# Patient Record
Sex: Female | Born: 1990 | Race: White | Hispanic: No | Marital: Married | Smoking: Current every day smoker
Health system: Southern US, Community
[De-identification: ages and names within clinical notes are randomized; demographics above are authoritative.]

## PROBLEM LIST (undated history)

## (undated) ENCOUNTER — Inpatient Hospital Stay: Payer: Self-pay

## (undated) DIAGNOSIS — F419 Anxiety disorder, unspecified: Secondary | ICD-10-CM

## (undated) DIAGNOSIS — Z1509 Genetic susceptibility to other malignant neoplasm: Secondary | ICD-10-CM

## (undated) DIAGNOSIS — N946 Dysmenorrhea, unspecified: Secondary | ICD-10-CM

## (undated) DIAGNOSIS — G43909 Migraine, unspecified, not intractable, without status migrainosus: Secondary | ICD-10-CM

## (undated) DIAGNOSIS — Z803 Family history of malignant neoplasm of breast: Secondary | ICD-10-CM

## (undated) DIAGNOSIS — Q278 Other specified congenital malformations of peripheral vascular system: Secondary | ICD-10-CM

## (undated) DIAGNOSIS — Z1589 Genetic susceptibility to other disease: Secondary | ICD-10-CM

## (undated) DIAGNOSIS — Z1379 Encounter for other screening for genetic and chromosomal anomalies: Secondary | ICD-10-CM

## (undated) DIAGNOSIS — Z1501 Genetic susceptibility to malignant neoplasm of breast: Secondary | ICD-10-CM

## (undated) HISTORY — DX: Genetic susceptibility to malignant neoplasm of breast: Z15.01

## (undated) HISTORY — DX: Encounter for other screening for genetic and chromosomal anomalies: Z13.79

## (undated) HISTORY — DX: Genetic susceptibility to other malignant neoplasm: Z15.09

## (undated) HISTORY — DX: Family history of malignant neoplasm of breast: Z80.3

## (undated) HISTORY — PX: CHOLECYSTECTOMY: SHX55

## (undated) HISTORY — DX: Genetic susceptibility to other disease: Z15.89

## (undated) HISTORY — DX: Dysmenorrhea, unspecified: N94.6

---
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
## 2010-03-18 ENCOUNTER — Ambulatory Visit (HOSPITAL_COMMUNITY): Admission: RE | Admit: 2010-03-18 | Discharge: 2010-03-18 | Payer: Self-pay | Admitting: Obstetrics & Gynecology

## 2010-03-18 IMAGING — US US OB DETAIL+14 WK
1 series · 14 of 28 positions shown · non-contrast
Comparison: none

OBSTETRICAL ULTRASOUND:
 This ultrasound was performed in The [HOSPITAL], and the [facility] report will be stored to [REDACTED] PACS.  This report is also available in [HOSPITAL]?s accessANYware.

[Series 1: us ob detail+14 wk · 74 acquisitions, 14 frames shown]
[im 3/74]
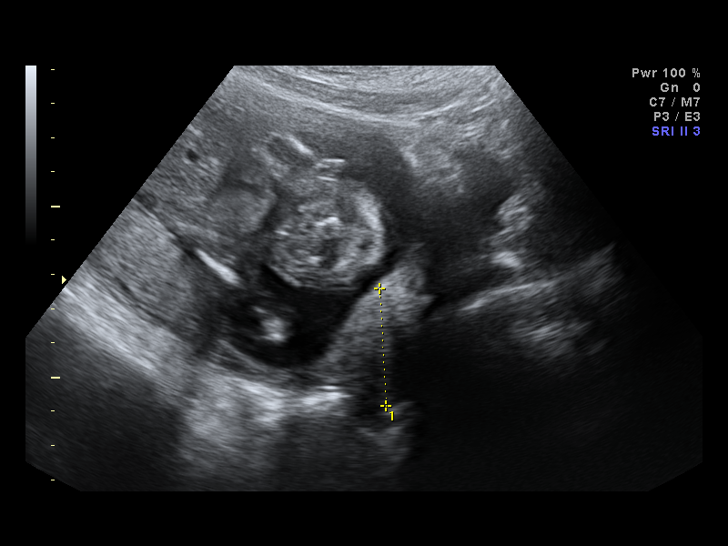
[im 9/74]
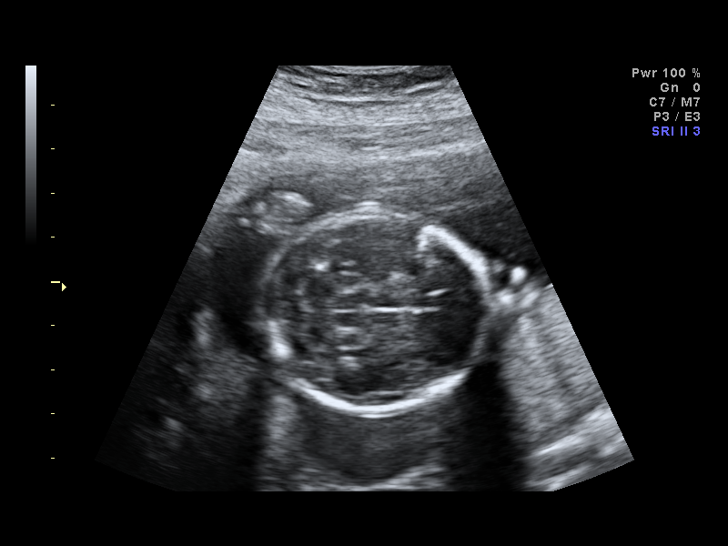
[im 14/74]
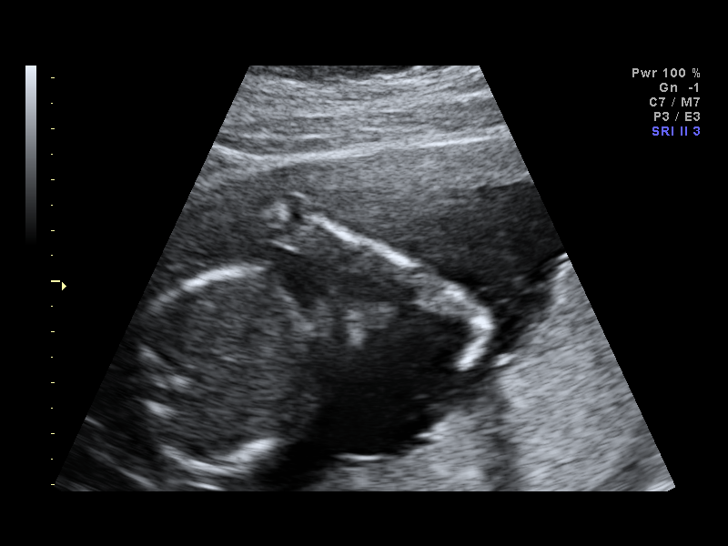
[im 19/74]
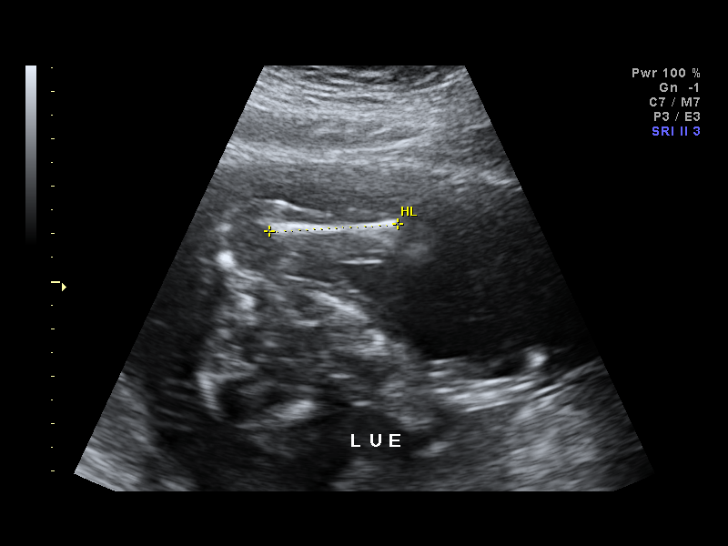
[im 25/74]
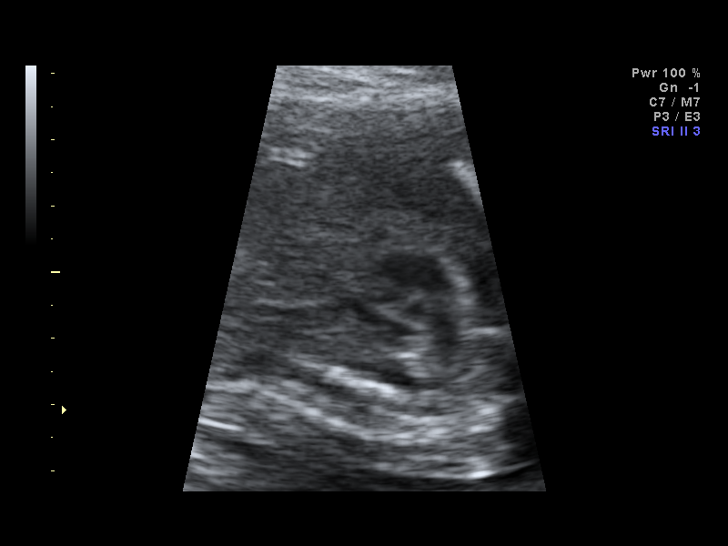
[im 30/74]
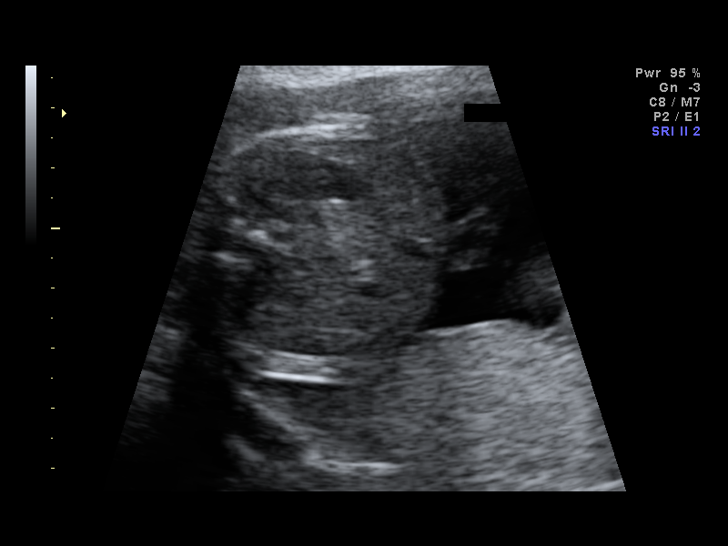
[im 36/74]
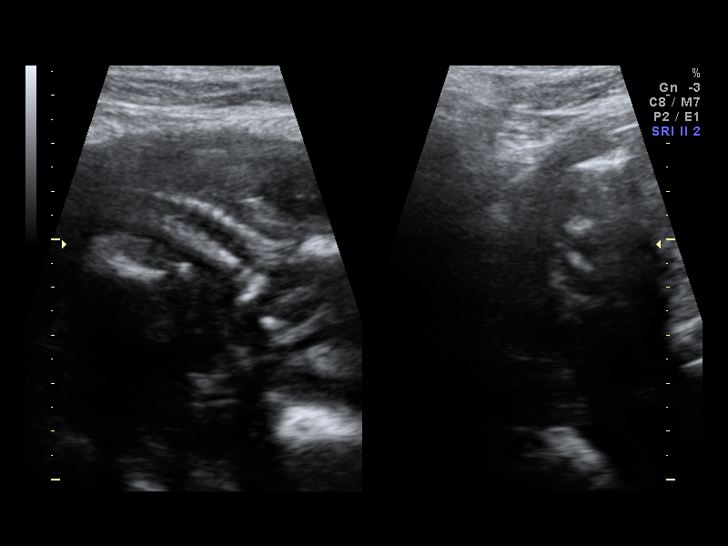
[im 41/74]
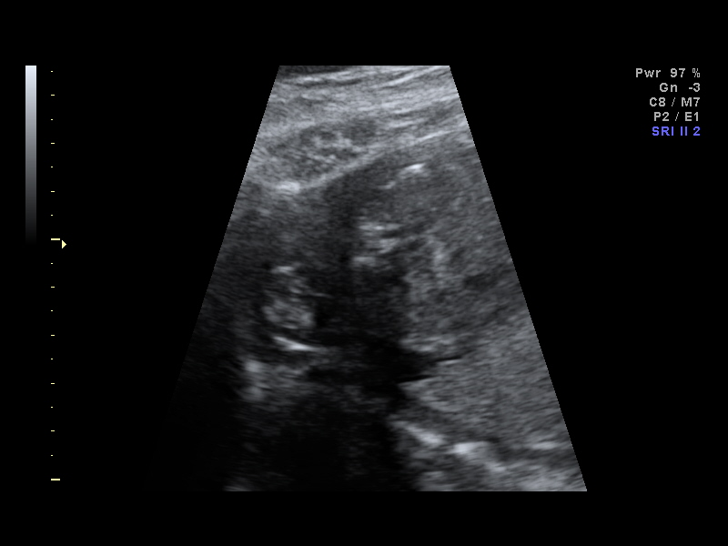
[im 46/74]
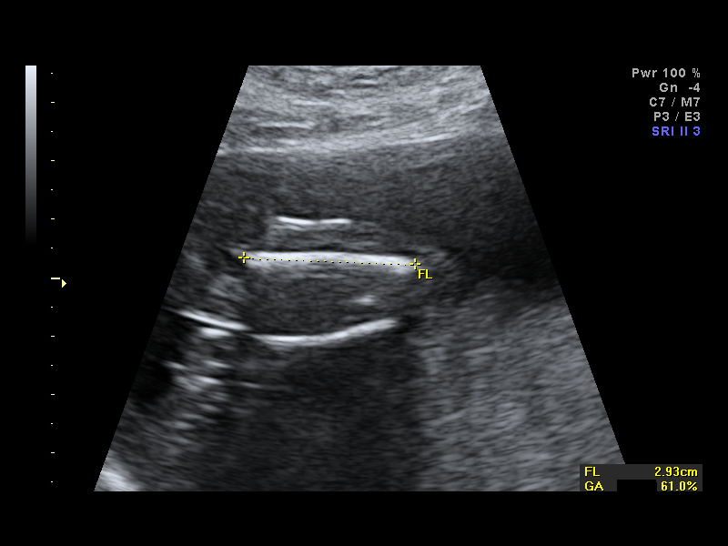
[im 52/74]
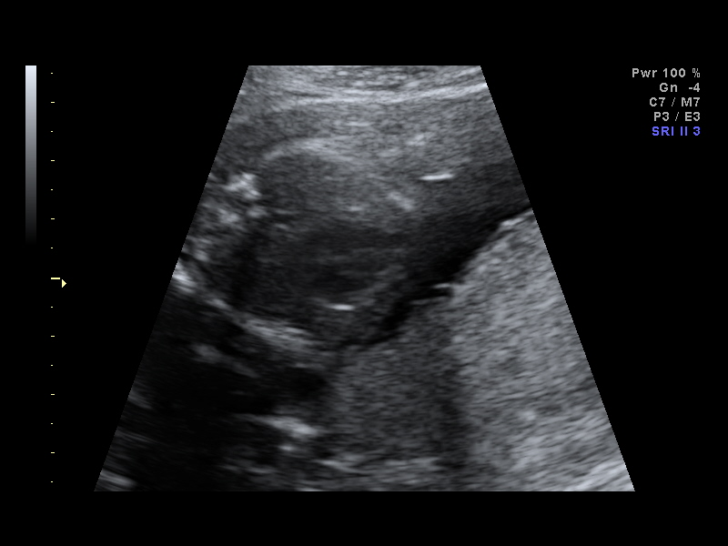
[im 57/74]
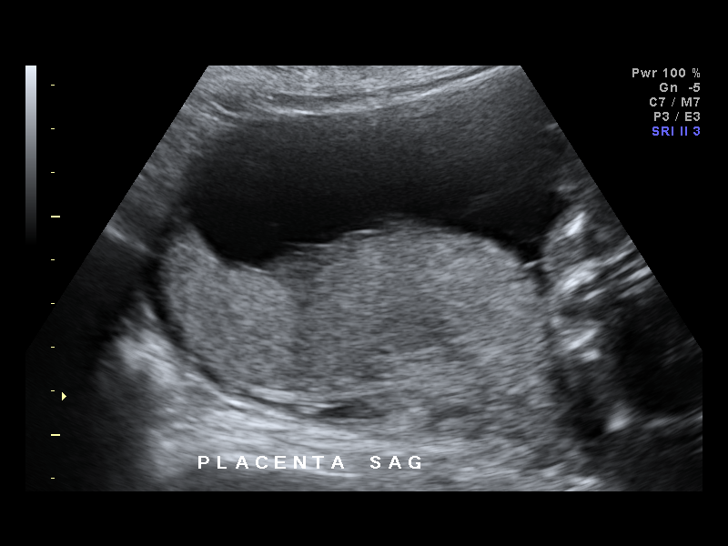
[im 63/74]
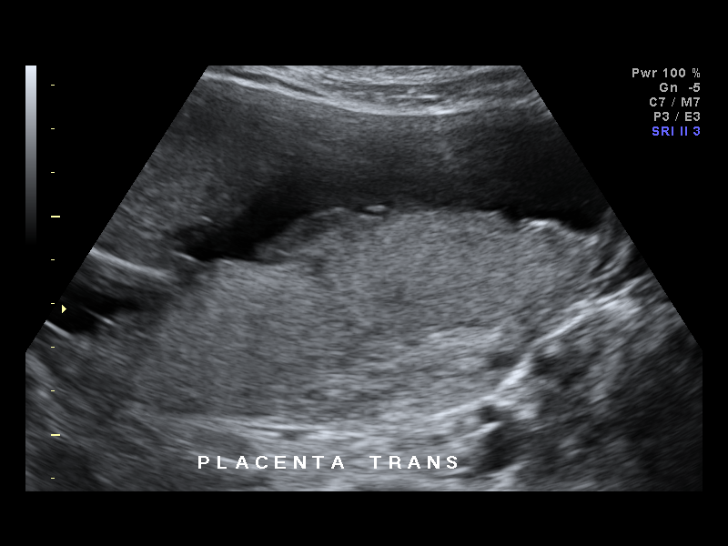
[im 68/74]
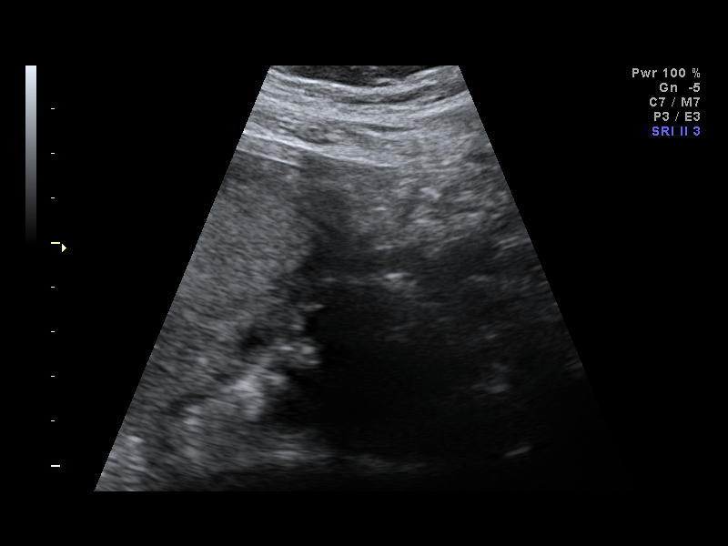
[im 74/74]
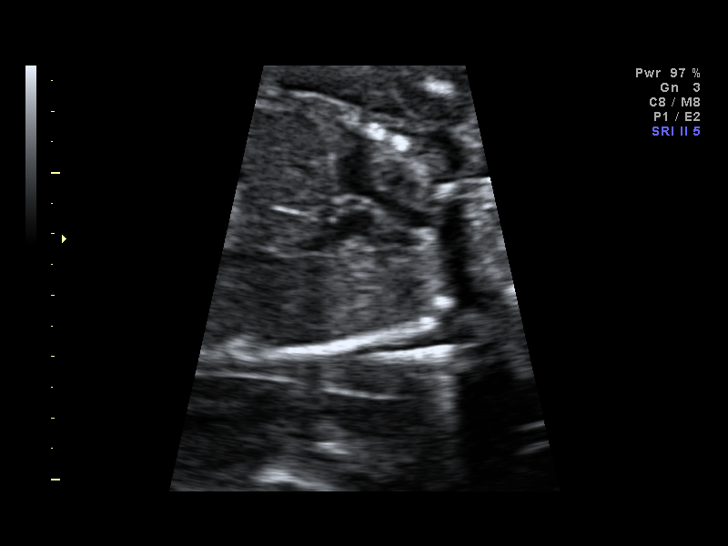

[14 of 28 positions shown; findings below may reference images not displayed]

IMPRESSION: [facility] has also been faxed to the ordering physician.

## 2010-05-06 ENCOUNTER — Ambulatory Visit (HOSPITAL_COMMUNITY): Admission: RE | Admit: 2010-05-06 | Discharge: 2010-05-06 | Payer: Self-pay | Admitting: Obstetrics & Gynecology

## 2010-05-06 IMAGING — US US OB FOLLOW-UP
1 series · 18 of 28 positions shown · non-contrast
Comparison: none

OBSTETRICAL ULTRASOUND:
 This ultrasound was performed in The [[facility]OSP[facility]], and the [facility] report will be stored to [REDACTED] PACS.  This report is also available in [HOSPITAL]?s accessANYware.

[Series 1: us ob follow-up · 41 acquisitions, 18 frames shown]
[im 1/41]
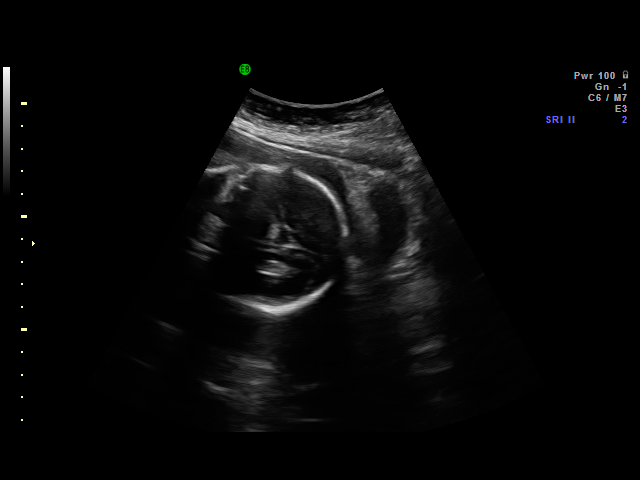
[im 3/41]
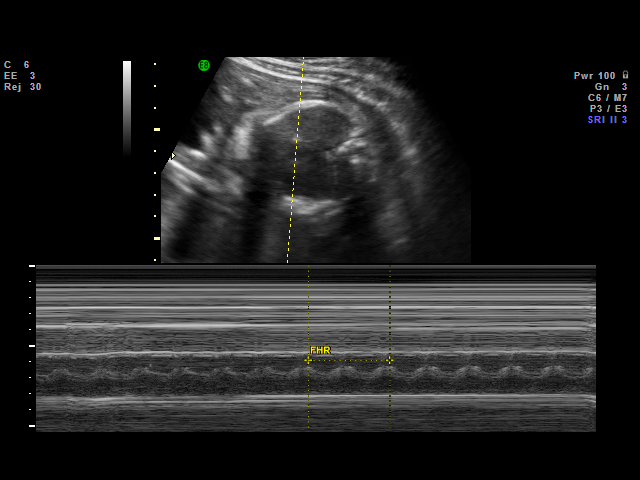
[im 5/41]
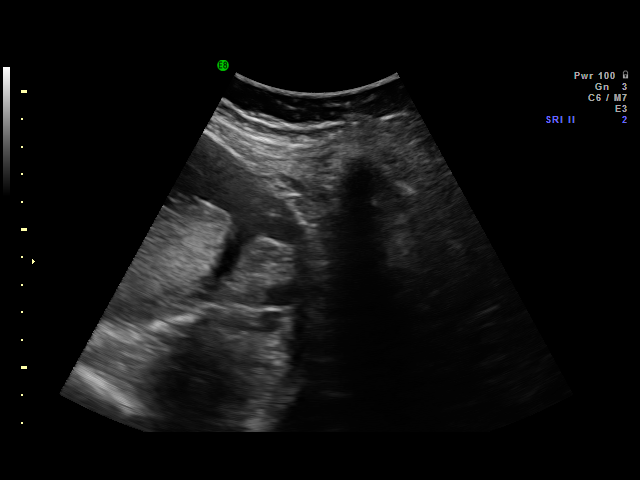
[im 8/41]
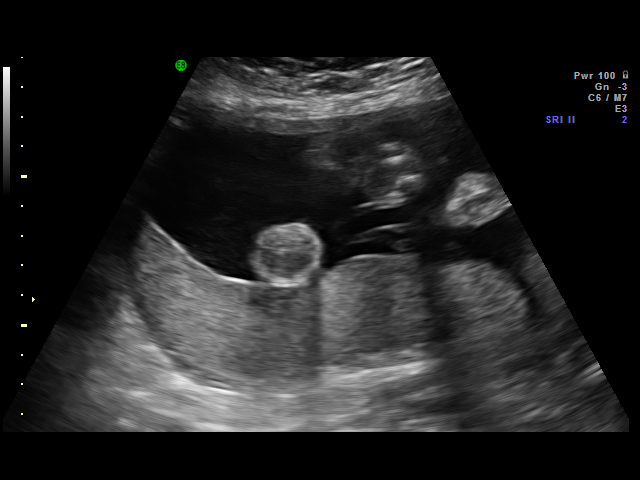
[im 11/41]
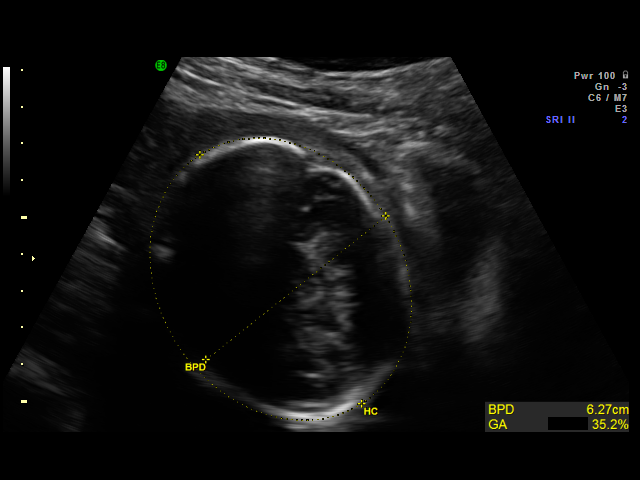
[im 12/41]
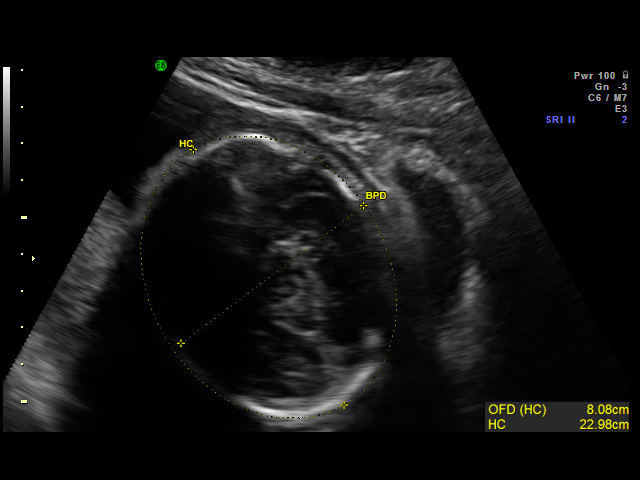
[im 15/41]
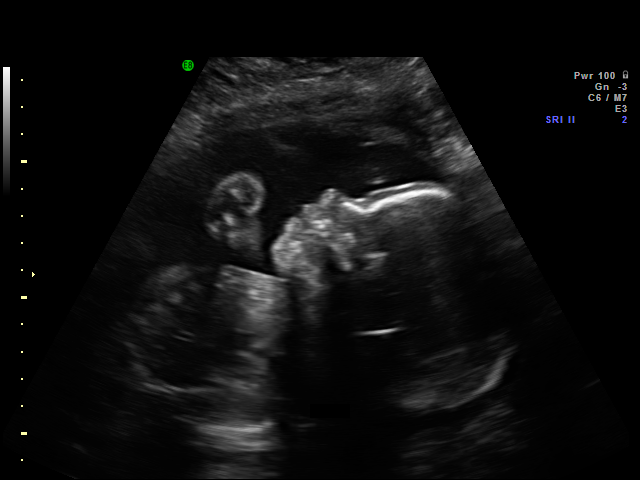
[im 17/41]
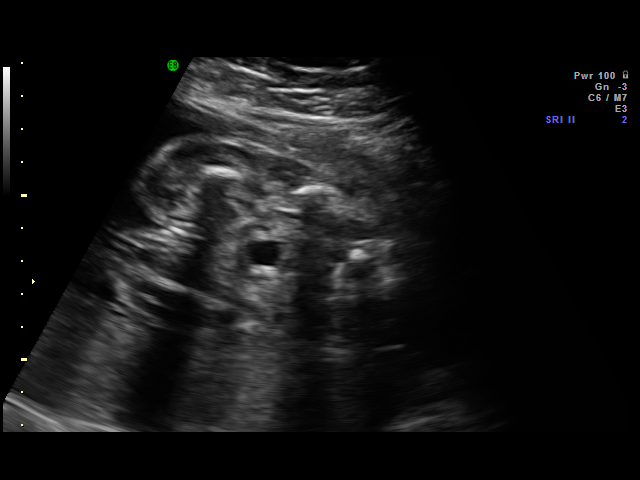
[im 20/41]
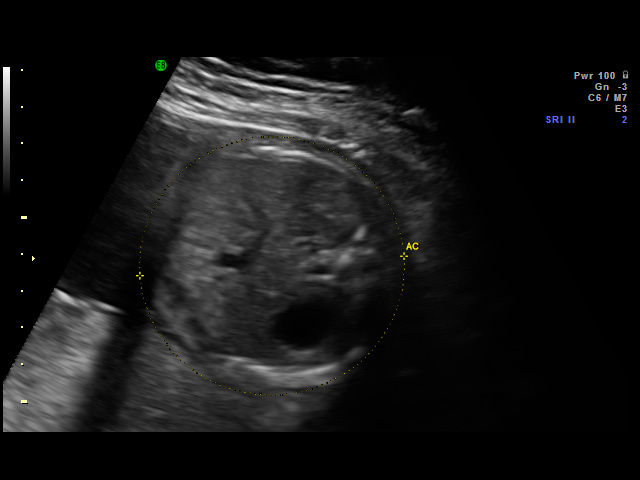
[im 21/41]
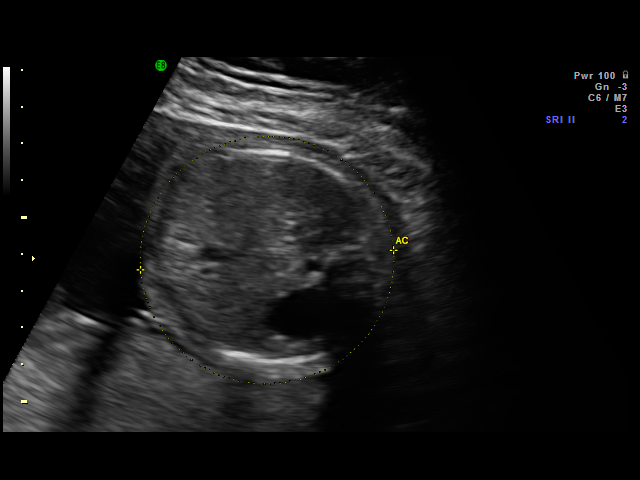
[im 24/41]
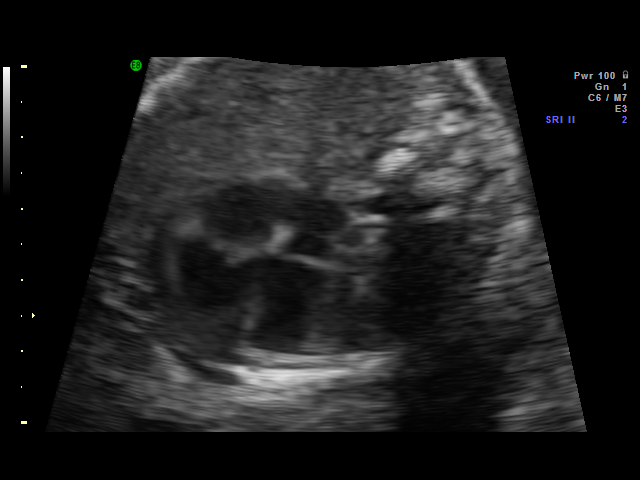
[im 26/41]
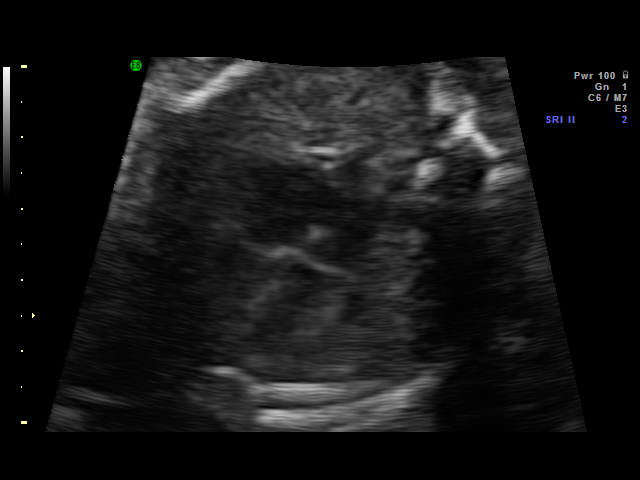
[im 29/41]
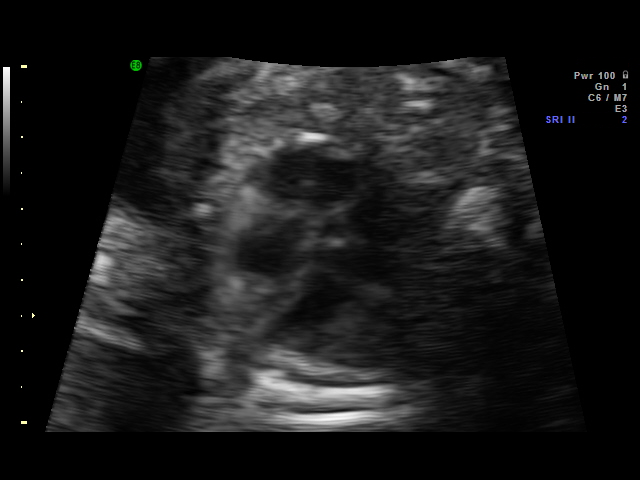
[im 32/41]
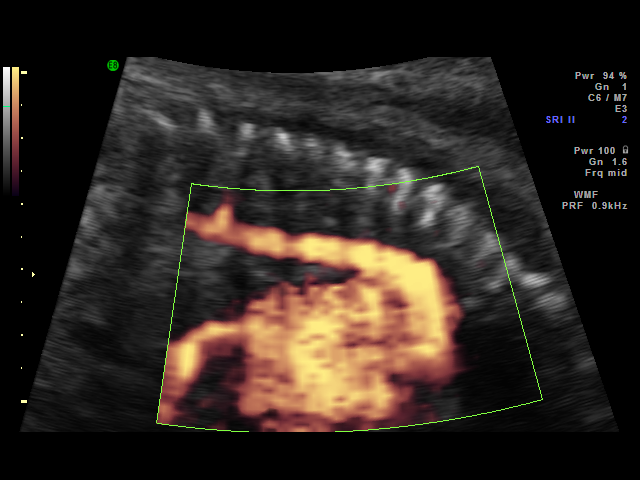
[im 33/41]
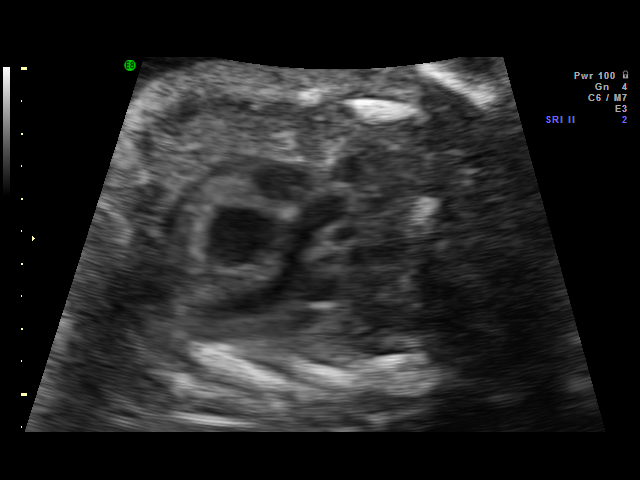
[im 36/41]
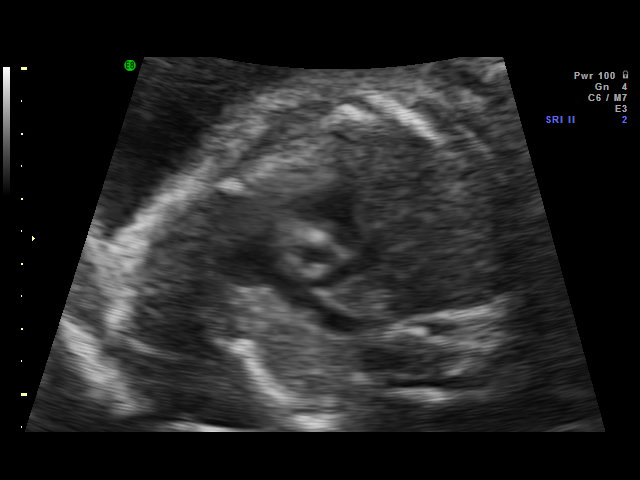
[im 38/41]
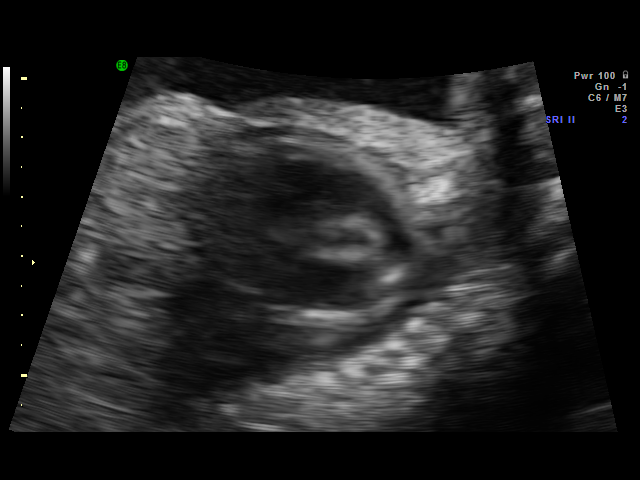
[im 41/41]
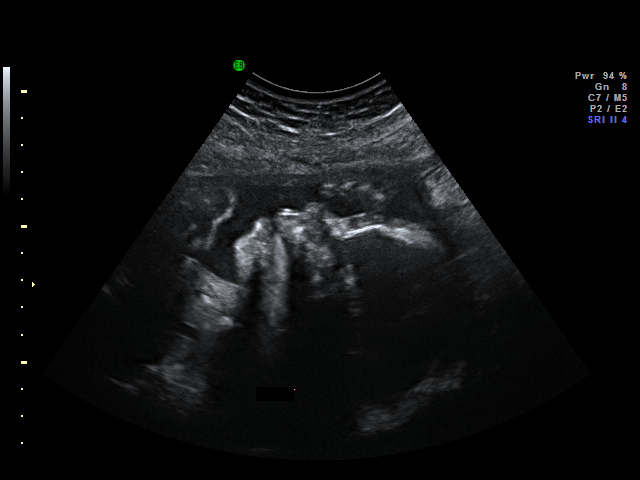

[18 of 28 positions shown; findings below may reference images not displayed]

IMPRESSION: [facility] has also been faxed to the ordering physician.

## 2010-06-03 ENCOUNTER — Ambulatory Visit (HOSPITAL_COMMUNITY): Admission: RE | Admit: 2010-06-03 | Discharge: 2010-06-03 | Payer: Self-pay | Admitting: Obstetrics & Gynecology

## 2010-06-03 IMAGING — US US OB FOLLOW-UP
1 series · 14 of 28 positions shown · non-contrast
Comparison: none

OBSTETRICAL ULTRASOUND:
 This ultrasound was performed in The [[facility]OSP[facility]], and the [facility] report will be stored to [REDACTED] PACS.  This report is also available in [HOSPITAL]?s accessANYware.

[Series 1: us ob follow-up · 68 acquisitions, 14 frames shown]
[im 3/68]
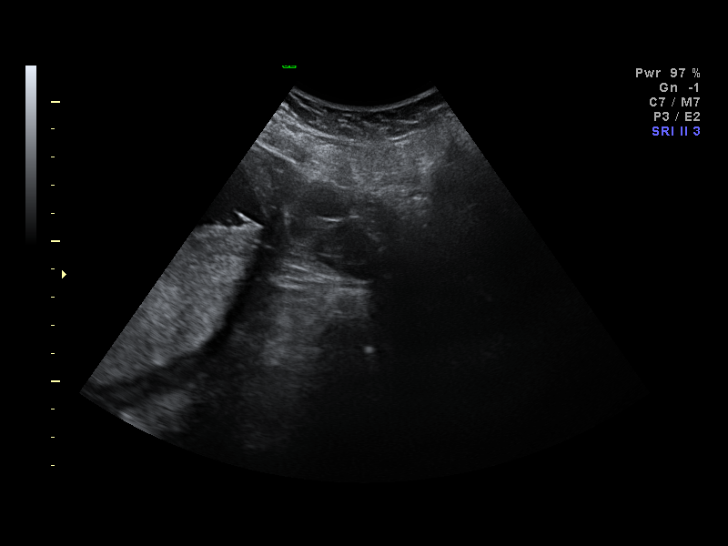
[im 8/68]
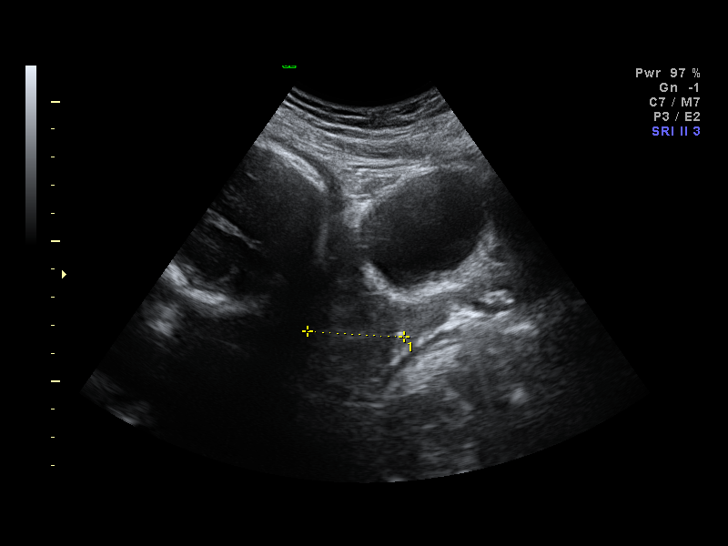
[im 13/68]
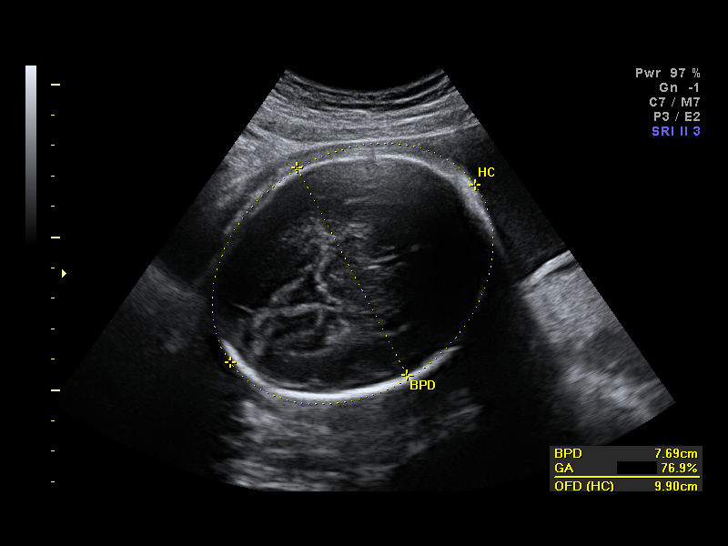
[im 18/68]
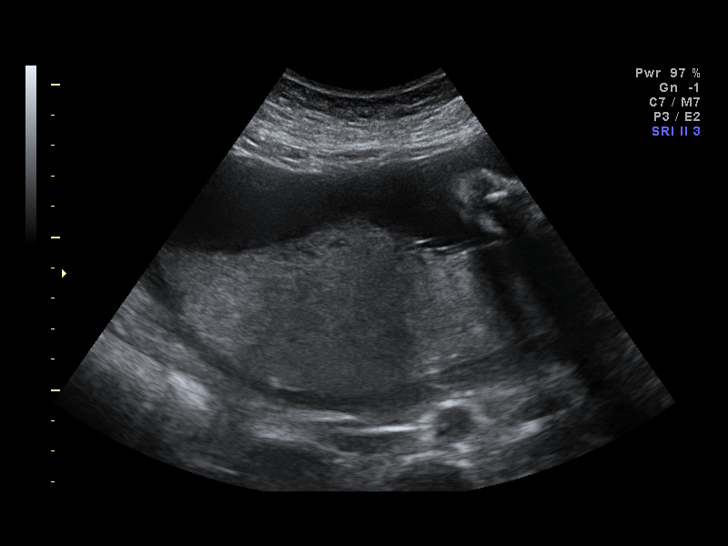
[im 23/68]
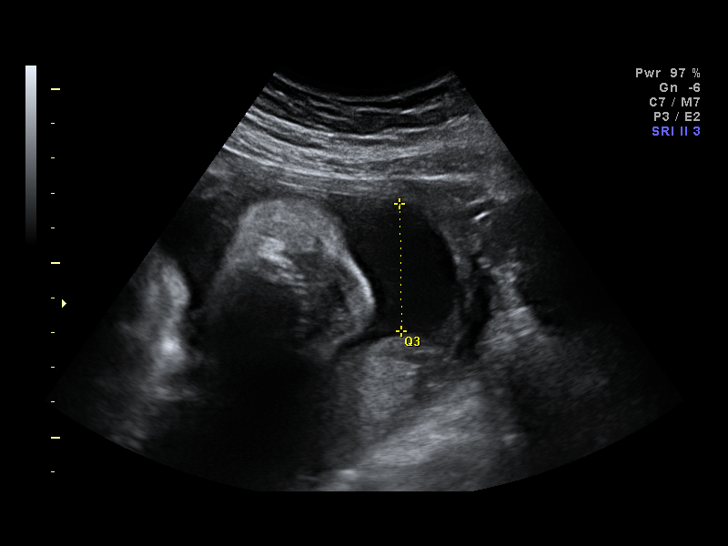
[im 28/68]
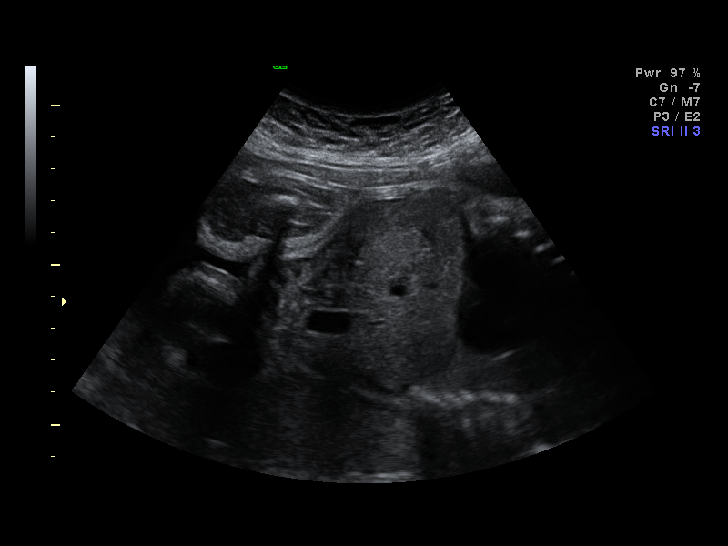
[im 33/68]
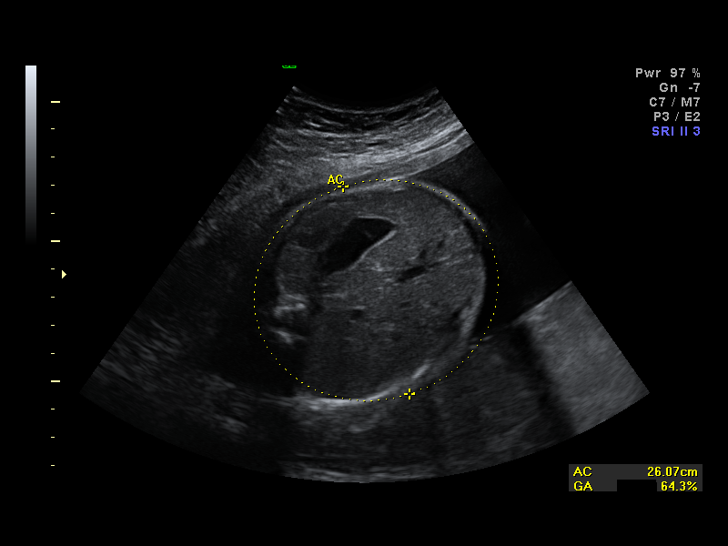
[im 38/68]
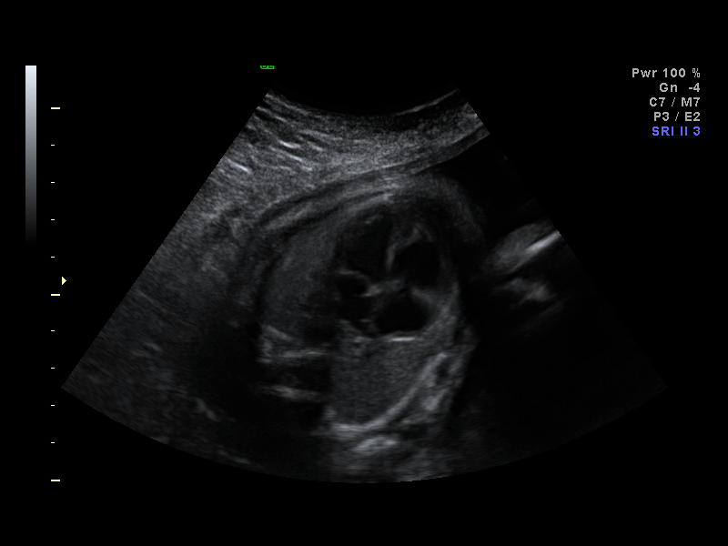
[im 43/68]
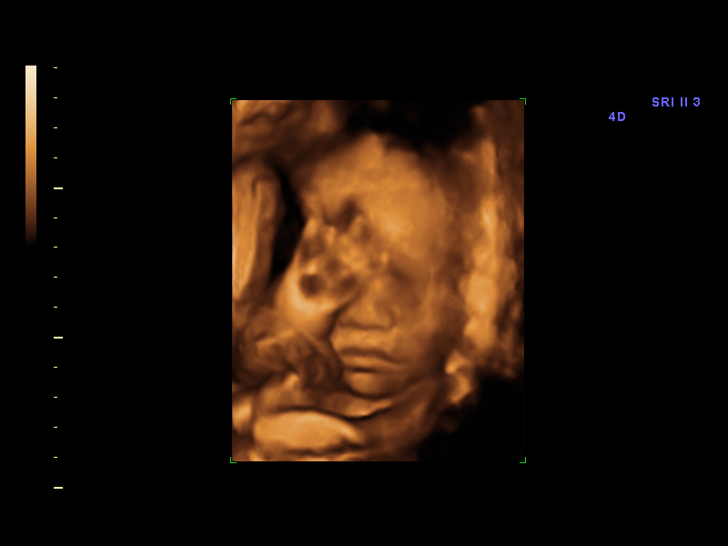
[im 48/68]
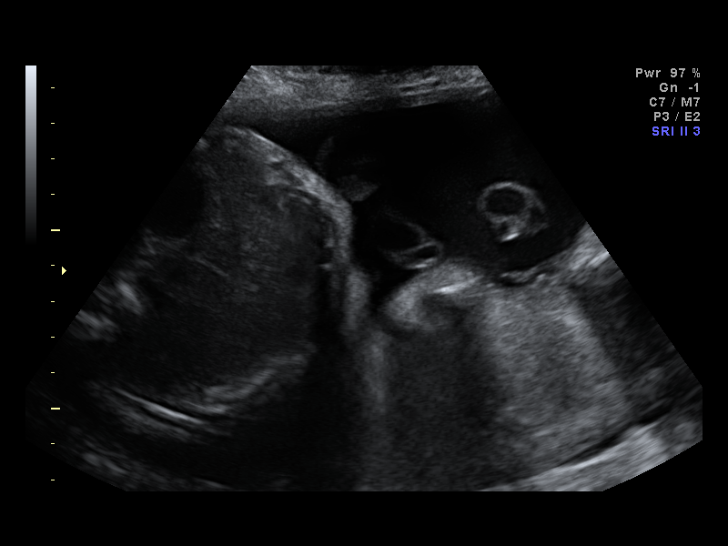
[im 53/68]
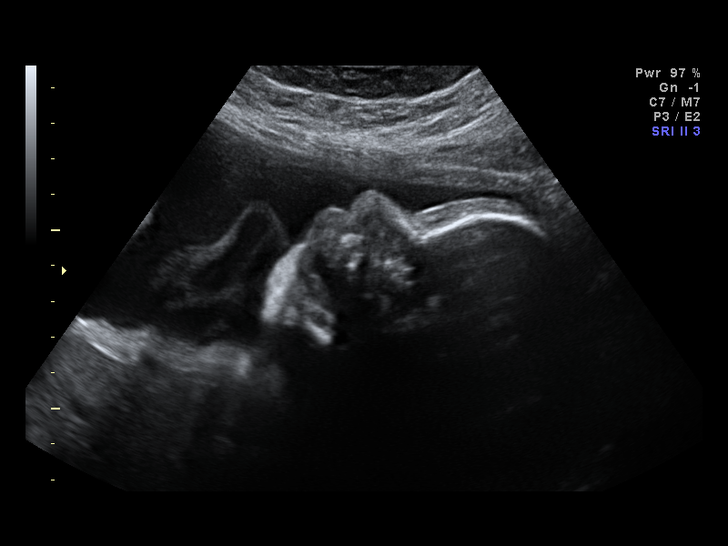
[im 58/68]
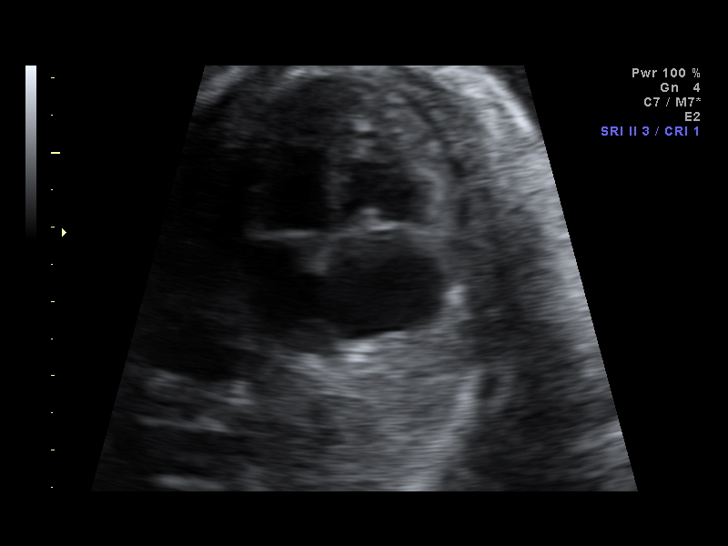
[im 63/68]
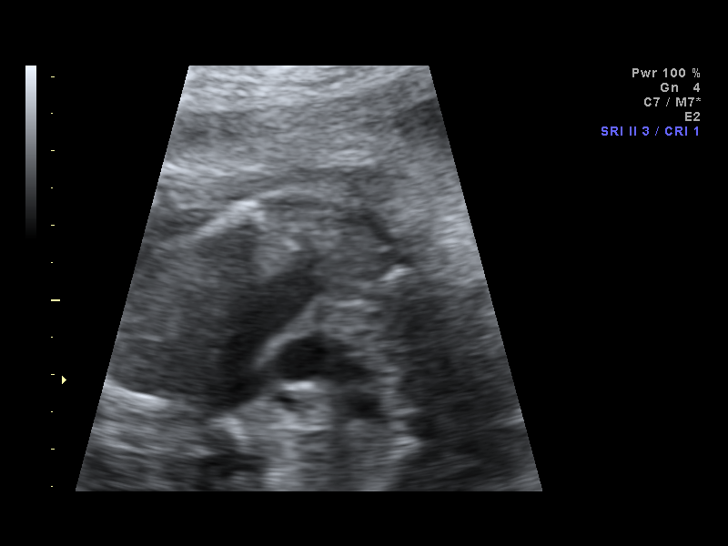
[im 68/68]
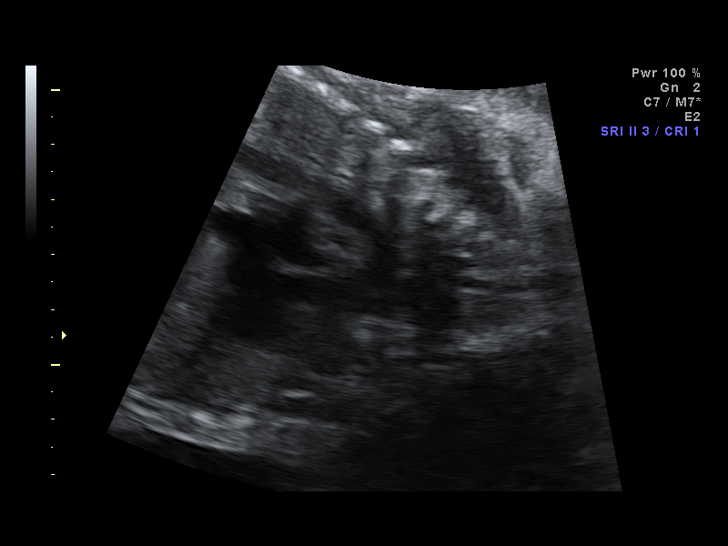

[14 of 28 positions shown; findings below may reference images not displayed]

IMPRESSION: [facility] has also been faxed to the ordering physician.

## 2010-08-08 ENCOUNTER — Inpatient Hospital Stay (HOSPITAL_COMMUNITY): Admission: AD | Admit: 2010-08-08 | Discharge: 2010-08-08 | Payer: Self-pay | Admitting: Obstetrics & Gynecology

## 2010-08-14 ENCOUNTER — Ambulatory Visit: Payer: Self-pay | Admitting: Family Medicine

## 2010-08-14 ENCOUNTER — Inpatient Hospital Stay (HOSPITAL_COMMUNITY): Admission: AD | Admit: 2010-08-14 | Discharge: 2010-08-15 | Payer: Self-pay | Admitting: Obstetrics & Gynecology

## 2010-08-16 ENCOUNTER — Ambulatory Visit: Payer: Self-pay | Admitting: Obstetrics and Gynecology

## 2010-08-16 ENCOUNTER — Inpatient Hospital Stay (HOSPITAL_COMMUNITY): Admission: AD | Admit: 2010-08-16 | Discharge: 2010-08-18 | Payer: Self-pay | Admitting: Obstetrics and Gynecology

## 2010-08-17 ENCOUNTER — Encounter: Payer: Self-pay | Admitting: Obstetrics and Gynecology

## 2010-12-04 ENCOUNTER — Encounter: Admission: RE | Admit: 2010-12-04 | Payer: Self-pay | Source: Home / Self Care | Admitting: Internal Medicine

## 2010-12-11 ENCOUNTER — Ambulatory Visit: Payer: Self-pay | Admitting: Physical Therapy
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
## 2010-12-11 NOTE — Assessment & Plan Note (Signed)
Summary: 1 month fup//ccm   Vital Signs:  Patient Profile:   20 Years Old Female Height:     63 inches Weight:      167 pounds Temp:     98.2 degrees F BP sitting:   114 / 84  (left arm) Cuff size:   regular  Vitals Entered By: Raechel Ache, RN (September 25, 2008 4:13 PM)                 Chief Complaint:  1 mo F/u; not feeling well. C/o diarrhea x 1 month. Taking Zegerid samples and iron OTC. Had episode last Thursday of passing out or seizure- was alone.Marland Kitchen  History of Present Illness: 20 year old female, who is seen today for follow-up of her vasovagal syncope.  Her last episode was associated with a witnessed tonic-clonic seizure felt secondary to the basal vagal-induced hypotension and cerebral hypoperfusion.  She has had no prior history of seizures, but has had recurrent syncope. She feels that she may have had  another fainting spell that this was unwitnessed, and she awoke on her bed.  Her main complaint is a feeling of excessive warmth, and facial flushing.  Her mother has recently treated for Graves' disease. She was placed on birth control pills recently due to  frequent heavy periods. She has a history of chronic epigastric pain and is status post cholecystectomy.  She was placed on Zegerid last visit and has had considerable diarrhea.    Current Allergies: ! PENICILLIN G PROCAINE (PENICILLIN G PROCAINE) ! AMOXICILLIN (AMOXICILLIN)  Past Medical History:    Reviewed history from 08/28/2008 and no changes required:       Low back pain        history of vasovagal syncope        chronic abdominal pain       Penicillin allergy     Physical Exam  General:      well hydrated.  overweight. No distress Head:      normocephalic and atraumatic  Eyes:      PERRL, EOMI,  fundi normal Mouth:      Clear without erythema, edema or exudate, mucous membranes moist Neck:      supple without adenopathy  Lungs:      Clear to ausc, no crackles, rhonchi or wheezing, no  grunting, flaring or retractions  Heart:      RRR without murmur  Abdomen:      BS+, soft, non-tender, no masses, no hepatosplenomegaly      Impression & Recommendations:  Problem # 1:  VASOVAGAL SYNCOPE (ICD-780.2) will continue to observe at this time; doubtful.  The patient has a primary seizure disorder  Problem # 2:  ABDOMINAL PAIN, EPIGASTRIC (ICD-789.06) diarrhea probably related to Zegerid therapy; will discontinue and treat with ranitidine.  She has many complaints seem functional; if her excess of warmth and flushing persists, will check a TSH next visit  Complete Medication List: 1)  Sprintec 28 0.25-35 Mg-mcg Tabs (Norgestimate-eth estradiol) .... As directed 2)  Ranitidine Hcl 150 Mg Tabs (Ranitidine hcl) .... One twice daily 3)  Hyoscyamine Sulfate 0.125 Mg Tabs (Hyoscyamine sulfate) .... One every 6 hours as needed for abdominal pain   Patient Instructions: 1)  Please schedule a follow-up appointment in 1 month. 2)  It is important that you exercise regularly at least 20 minutes 5 times a week. If you develop chest pain, have severe difficulty breathing, or feel very tired , stop exercising immediately and seek medical  attention. 3)  discontinue ibuprofen products 4)  discontinue Zegerid   Prescriptions: HYOSCYAMINE SULFATE 0.125 MG TABS (HYOSCYAMINE SULFATE) one every 6 hours as needed for abdominal pain  #90 x 3   Entered and Authorized by:   Gordy Savers  MD   Signed by:   Gordy Savers  MD on 09/25/2008   Method used:   Print then Give to Patient   RxID:   [id number] RANITIDINE HCL 150 MG TABS (RANITIDINE HCL) one twice daily  #60 x 3   Entered and Authorized by:   Gordy Savers  MD   Signed by:   Gordy Savers  MD on 09/25/2008   Method used:   Print then Give to Patient   RxID:   [id number]  ]

## 2010-12-11 NOTE — Assessment & Plan Note (Signed)
Summary: new acute headache/mhf   Vital Signs:  Patient Profile:   20 Years Old Female Height:     63 inches Weight:      170 pounds BMI:     30.22 Temp:     98.1 degrees F oral Pulse rate:   72 / minute Pulse rhythm:   regular BP sitting:   78 / 58  (left arm) Cuff size:   regular  Vitals Entered By: Raechel Ache, RN (August 28, 2008 2:03 PM)                 Chief Complaint:  Gave blood at school Friday and had a seizure that evening at friend's house. Woke up with terrible headache which has persisted- eyes and hair hurts also.Marland Kitchen  History of Present Illness: 20 year old female seen today to establish with our practice.  4 days ago, the patient donated blood  during a school supported blood  drive.  later that evening, she experienced some nausea, weakness, diaphoresis, and dizziness.  She experienced a brief episode of loss of consciousness associated with some tonic-clonic motor activity.  Her boyfriend gave  a history of her eyes rolling back and tongue biting. She has had two episodes of vasovagal syncope in the past one associated with abdominal pain, nausea and vomiting associated with symptomatic gallbladder disease.  Another episode occurred during venipuncture.  The following morning she awoke with significant headache that lasted two days.  It was in the occipital area and may have been associated with some mild photophobia.  Her mother has a history of migraine headaches and also a history of epilepsy in the past associated with a history of head trauma.  For the past two days.  The patient has returned to school and has had only mild headaches.      Current Allergies: ! PENICILLIN G PROCAINE (PENICILLIN G PROCAINE) ! AMOXICILLIN (AMOXICILLIN)  Past Medical History:    Low back pain     history of vasovagal syncope and        Penicillin allergy  Past Surgical History:    Reviewed history and no changes required:       cholecystectomy September 2008   Family  History:    mother with Graves' disease  Social History:    Single    10th grade in high school    Never Smoked   Risk Factors:  Tobacco use:  never   Physical Exam  General:      well hydrated.  mildly overweight Head:      normocephalic and atraumatic  Eyes:      PERRL, EOMI,  fundi normal Ears:      TM's pearly gray with normal light reflex and landmarks, canals clear  Mouth:      Clear without erythema, edema or exudate, mucous membranes moist Neck:      supple without adenopathy  Lungs:      Clear to ausc, no crackles, rhonchi or wheezing, no grunting, flaring or retractions  Heart:      RRR without murmur  Abdomen:      BS+, soft, non-tender, no masses, no hepatosplenomegaly  Musculoskeletal:      no scoliosis, normal gait, normal posture Pulses:      femoral pulses present  Extremities:      Well perfused with no cyanosis or deformity noted  Neurologic:      Neurologic exam grossly intact CN II-XII intact, sensory intact, Neg babinski, muscle strength symmetrically intact, and  deep tendon reflexes symmetrically intact.   Developmental:      alert and cooperative  Skin:      intact without lesions, rashes  Cervical nodes:      no significant adenopathy.   Axillary nodes:      no significant adenopathy.   Inguinal nodes:      no significant adenopathy.   Psychiatric:      alert and cooperative      Impression & Recommendations:  Problem # 1:  VASOVAGAL SYNCOPE (ICD-780.2) I believe the patient experienced a brief seizure related to a vasovagal reaction with hypotension and cerebral hypoperfusion.  Options were discussed;  in view of the classic history and normal.  Neurological examination will clinically observe at this time; the patient and mother comfortable with this option; if there is any change in her status will reassess  Problem # 2:  LOW BACK PAIN (ICD-724.2) will continue Tylenol and/or Advil p.r.n;. low-back exercises  dispensed  Complete Medication List: 1)  Sprintec 28 0.25-35 Mg-mcg Tabs (Norgestimate-eth estradiol) .... As directed   Patient Instructions: 1)  Please schedule a follow-up appointment in  4 weeks 2)  It is important that you exercise regularly at least 20 minutes 5 times a week. If you develop chest pain, have severe difficulty breathing, or feel very tired , stop exercising immediately and seek medical attention. 3)  perform low back exercises as dispensed   Prescriptions: SPRINTEC 28 0.25-35 MG-MCG TABS (NORGESTIMATE-ETH ESTRADIOL) as directed  #3 months x 4   Entered and Authorized by:   Gordy Savers  MD   Signed by:   Gordy Savers  MD on 08/28/2008   Method used:   Print then Give to Patient   RxID:   [id number]  ]

## 2011-01-22 LAB — CBC
HCT: 32.2 % — ABNORMAL LOW (ref 36.0–46.0)
Hemoglobin: 11 g/dL — ABNORMAL LOW (ref 12.0–15.0)
MCH: 29.2 pg (ref 26.0–34.0)
MCHC: 34 g/dL (ref 30.0–36.0)
MCV: 86 fL (ref 78.0–100.0)
Platelets: 354 10*3/uL (ref 150–400)
RBC: 3.75 MIL/uL — ABNORMAL LOW (ref 3.87–5.11)
RDW: 13.7 % (ref 11.5–15.5)
WBC: 20.1 10*3/uL — ABNORMAL HIGH (ref 4.0–10.5)

## 2011-01-22 LAB — WET PREP, GENITAL: Clue Cells Wet Prep HPF POC: NONE SEEN

## 2011-01-22 LAB — DIFFERENTIAL
Basophils Absolute: 0.1 10*3/uL (ref 0.0–0.1)
Basophils Relative: 0 % (ref 0–1)
Eosinophils Absolute: 0.2 10*3/uL (ref 0.0–0.7)
Eosinophils Relative: 1 % (ref 0–5)
Lymphocytes Relative: 13 % (ref 12–46)
Lymphs Abs: 2.6 10*3/uL (ref 0.7–4.0)
Monocytes Absolute: 1.1 10*3/uL — ABNORMAL HIGH (ref 0.1–1.0)
Monocytes Relative: 6 % (ref 3–12)
Neutro Abs: 16.1 10*3/uL — ABNORMAL HIGH (ref 1.7–7.7)
Neutrophils Relative %: 80 % — ABNORMAL HIGH (ref 43–77)

## 2011-01-22 LAB — URINALYSIS, ROUTINE W REFLEX MICROSCOPIC
Bilirubin Urine: NEGATIVE
Glucose, UA: NEGATIVE mg/dL
Ketones, ur: NEGATIVE mg/dL
Nitrite: NEGATIVE
Protein, ur: NEGATIVE mg/dL
Specific Gravity, Urine: 1.01 (ref 1.005–1.030)
Urobilinogen, UA: 0.2 mg/dL (ref 0.0–1.0)
pH: 7 (ref 5.0–8.0)

## 2011-01-22 LAB — URINE MICROSCOPIC-ADD ON

## 2011-01-22 LAB — GC/CHLAMYDIA PROBE AMP, GENITAL
Chlamydia, DNA Probe: NEGATIVE
GC Probe Amp, Genital: NEGATIVE

## 2011-01-22 LAB — RPR: RPR Ser Ql: NONREACTIVE

## 2011-02-24 LAB — URINALYSIS, ROUTINE W REFLEX MICROSCOPIC
Bilirubin Urine: NEGATIVE
Glucose, UA: NEGATIVE mg/dL
Hgb urine dipstick: NEGATIVE
Ketones, ur: NEGATIVE mg/dL
Nitrite: NEGATIVE
Protein, ur: NEGATIVE mg/dL
Specific Gravity, Urine: 1.025 (ref 1.005–1.030)
Urobilinogen, UA: 0.2 mg/dL (ref 0.0–1.0)
pH: 6.5 (ref 5.0–8.0)

## 2011-02-24 LAB — WET PREP, GENITAL
Clue Cells Wet Prep HPF POC: NONE SEEN
Trich, Wet Prep: NONE SEEN
Yeast Wet Prep HPF POC: NONE SEEN

## 2011-02-24 LAB — GC/CHLAMYDIA PROBE AMP, GENITAL
Chlamydia, DNA Probe: NEGATIVE
GC Probe Amp, Genital: NEGATIVE

## 2011-02-24 LAB — PREGNANCY, URINE: Preg Test, Ur: NEGATIVE

## 2011-03-24 NOTE — Op Note (Signed)
Isabel [name], Isabel [name]            ACCOUNT NO.:  [id number]   MEDICAL RECORD NO.:  [id number]          PATIENT TYPE:  AMB   LOCATION:  DAY                          FACILITY:  [facility]   PHYSICIAN:  Alfonse Ras, MD   DATE OF BIRTH:  1990-11-22   DATE OF PROCEDURE:  07/26/2007  DATE OF DISCHARGE:                               OPERATIVE REPORT   PREOPERATIVE DIAGNOSIS:  Cholelithiasis and biliary dyskinesia.   POSTOPERATIVE DIAGNOSIS:  Cholelithiasis and biliary dyskinesia.   PROCEDURE:  Laparoscopic cholecystectomy with attempted intraoperative  cholangiogram.   ASSISTANT:  Thornton Park. Daphine Deutscher, MD.   ANESTHESIA:  General anesthesia, endotracheal tube.   DESCRIPTION:  The patient was taken to the operating room, placed in  supine position.  After adequate general anesthesia was induced using  endotracheal tube, the abdomen was prepped and draped in normal sterile  fashion.  Using a transverse infraumbilical incision, I dissected down  to the fascia.  Fascia was opened vertically.  Peritoneum was entered  and 0 Vicryl pursestring suture was placed along the fascial defect.  Hassan trocar was placed in the abdomen and pneumoperitoneum was  obtained.  Under direct vision, a 11-mm trocar was placed in the  subxiphoid region and two 5-mm trocars were placed in the right abdomen.  The gallbladder was identified and retracted cephalad.  Dissecting down  at the neck of the gallbladder, there were number of adhesions and these  were taken down.  The critical view of the cystic duct was identified  and its junction with the gallbladder was identified.  There was a stone  down in the neck of the gallbladder which made retraction difficult.  The cystic duct was clipped proximally right at the gallbladder.  Ductotomy was made of the cystic duct and attempts were made to place  the catheter; however, the duct was very, very small and could not be  cannulated.  The cystic duct was then triply  clipped and divided.  Cystic artery was identified.  Critical view was made of that as well.  The gallbladder was then taken off the gallbladder bed using Bovie  electrocautery.  There was a small rent made in the posterior wall of  the gallbladder with some spillage of bile, but no spillage of stones.  This was then removed through the umbilical port without difficulty.  The right upper quadrant was copiously irrigated.  Adequate hemostasis  was ensured.  The fascial defect was closed with the 0 Vicryl  pursestring suture.  Skin incisions were closed with subcuticular 4-0  Monocryl injected with 0.5 Marcaine.  Steri-Strips and sterile dressings  were applied.  The patient tolerated the procedure well, went to PACU in  good condition.      Alfonse Ras, MD  Electronically Signed     KRE/MEDQ  D:  07/26/2007  T:  07/27/2007  Job:  [id number]

## 2011-07-11 ENCOUNTER — Emergency Department (HOSPITAL_COMMUNITY)
Admission: EM | Admit: 2011-07-11 | Discharge: 2011-07-11 | Disposition: A | Discharge status: Home / Self Care | Payer: Medicaid Other | Attending: Emergency Medicine | Admitting: Emergency Medicine

## 2011-07-11 ENCOUNTER — Encounter: Payer: Self-pay | Admitting: *Deleted

## 2011-07-11 DIAGNOSIS — R350 Frequency of micturition: Secondary | ICD-10-CM | POA: Insufficient documentation

## 2011-07-11 DIAGNOSIS — J069 Acute upper respiratory infection, unspecified: Secondary | ICD-10-CM

## 2011-07-11 DIAGNOSIS — R300 Dysuria: Secondary | ICD-10-CM

## 2011-07-11 DIAGNOSIS — R3915 Urgency of urination: Secondary | ICD-10-CM | POA: Insufficient documentation

## 2011-07-11 DIAGNOSIS — F17200 Nicotine dependence, unspecified, uncomplicated: Secondary | ICD-10-CM | POA: Insufficient documentation

## 2011-07-11 LAB — POCT PREGNANCY, URINE: Preg Test, Ur: NEGATIVE

## 2011-07-11 LAB — URINALYSIS, ROUTINE W REFLEX MICROSCOPIC
Bilirubin Urine: NEGATIVE
Glucose, UA: NEGATIVE mg/dL
Ketones, ur: NEGATIVE mg/dL
Leukocytes, UA: NEGATIVE
Nitrite: NEGATIVE
Protein, ur: NEGATIVE mg/dL
Specific Gravity, Urine: 1.01 (ref 1.005–1.030)
Urobilinogen, UA: 0.2 mg/dL (ref 0.0–1.0)
pH: 6.5 (ref 5.0–8.0)

## 2011-07-11 LAB — URINE MICROSCOPIC-ADD ON

## 2011-07-11 MED ORDER — PHENAZOPYRIDINE HCL 200 MG PO TABS: 200.0000 mg | ORAL_TABLET | Freq: Three times a day (TID) | ORAL | Status: AC

## 2011-07-11 NOTE — ED Provider Notes (Signed)
History     CSN: [id number] Arrival date & time: 07/11/2011 11:42 AM  Chief Complaint  Patient presents with  . Generalized Body Aches   Patient is a 20 y.o. female presenting with URI. The history is provided by the patient.  URI The primary symptoms include cough. Primary symptoms do not include fever, headaches, sore throat, abdominal pain, nausea, vomiting, arthralgias or rash. Primary symptoms comment: She describes nasal congestion,  with clear rhinorrhea,  nonproductive cough and sore throat since yesterday.  Als descres increased urinary frequency which started today.  Denies fever. The current episode started yesterday. This is a new problem. The problem has not changed since onset. The cough began yesterday. The cough is non-productive.  Symptoms associated with the illness include congestion and rhinorrhea. The illness is not associated with chills, facial pain or sinus pressure.    History reviewed. No pertinent past medical history.  Past Surgical History  Procedure Date  . Cholecystectomy     History reviewed. No pertinent family history.  History  Substance Use Topics  . Smoking status: Current Everyday Smoker -- 0.5 packs/day  . Smokeless tobacco: Not on file  . Alcohol Use: No    OB History    Grav Para Term Preterm Abortions TAB SAB Ect Mult Living                  Review of Systems  Constitutional: Negative for fever, chills and diaphoresis.  HENT: Positive for congestion and rhinorrhea. Negative for sore throat, facial swelling, sneezing, neck pain, postnasal drip, sinus pressure and ear discharge.   Eyes: Negative.   Respiratory: Positive for cough. Negative for chest tightness and shortness of breath.   Cardiovascular: Negative for chest pain.  Gastrointestinal: Negative for nausea, vomiting and abdominal pain.  Genitourinary: Positive for urgency and frequency. Negative for flank pain, vaginal discharge, difficulty urinating and pelvic pain.    Musculoskeletal: Negative for joint swelling and arthralgias.  Skin: Negative.  Negative for rash and wound.  Neurological: Negative for dizziness, weakness, light-headedness, numbness and headaches.  Hematological: Negative.   Psychiatric/Behavioral: Negative.     Physical Exam  BP 126/81  Pulse 94  Temp(Src) 98.7 F (37.1 C) (Oral)  Resp 18  Ht 5\' 4"  (1.626 m)  Wt 170 lb (77.111 kg)  BMI 29.18 kg/m2  SpO2 100%  Physical Exam  Nursing note and vitals reviewed. Constitutional: She is oriented to person, place, and time. She appears well-developed and well-nourished.  HENT:  Head: Normocephalic and atraumatic.  Right Ear: External ear normal.  Left Ear: External ear normal.  Nose: Mucosal edema and rhinorrhea present. Right sinus exhibits no maxillary sinus tenderness and no frontal sinus tenderness. Left sinus exhibits no maxillary sinus tenderness and no frontal sinus tenderness.  Eyes: Conjunctivae are normal.  Neck: Normal range of motion.  Cardiovascular: Normal rate, regular rhythm, normal heart sounds and intact distal pulses.   Pulmonary/Chest: Effort normal and breath sounds normal. No respiratory distress. She has no wheezes. She has no rales.  Abdominal: Soft. Bowel sounds are normal. There is no tenderness.  Musculoskeletal: Normal range of motion.  Lymphadenopathy:    She has no cervical adenopathy.  Neurological: She is alert and oriented to person, place, and time.  Skin: Skin is warm and dry.  Psychiatric: She has a normal mood and affect.    ED Course  Procedures  MDM URI,  Normal respiratory exam.        Candis Musa, PA 07/11/11 1713

## 2011-07-11 NOTE — ED Notes (Signed)
Pt c/o nasal congestion, sore throat , body aches, and coughing for 4 days. Pt alert and oriented x 3. Skin warm and dry. Color pink. Breath sounds clear and equal bilaterally. C/o pain in her mid back with cough and deep breathing.

## 2011-07-11 NOTE — ED Notes (Signed)
Pt c/o generalized body pain runny nose and cough.

## 2011-07-14 ENCOUNTER — Emergency Department (HOSPITAL_COMMUNITY): Admission: EM | Admit: 2011-07-14 | Discharge: 2011-07-14 | Discharge status: Against Medical Advice | Payer: Self-pay

## 2011-07-14 NOTE — ED Provider Notes (Signed)
Medical screening examination/treatment/procedure(s) were performed by non-physician practitioner and as supervising physician I was immediately available for consultation/collaboration.   Benny Lennert, MD 07/14/11 [phone]

## 2011-07-14 NOTE — ED Notes (Signed)
Called for triage, no answer

## 2011-07-15 ENCOUNTER — Emergency Department (HOSPITAL_COMMUNITY)
Admission: EM | Admit: 2011-07-15 | Discharge: 2011-07-15 | Disposition: A | Discharge status: Home / Self Care | Payer: Medicaid Other | Attending: Emergency Medicine | Admitting: Emergency Medicine

## 2011-07-15 ENCOUNTER — Encounter (HOSPITAL_COMMUNITY): Payer: Self-pay | Admitting: *Deleted

## 2011-07-15 DIAGNOSIS — F17200 Nicotine dependence, unspecified, uncomplicated: Secondary | ICD-10-CM | POA: Insufficient documentation

## 2011-07-15 DIAGNOSIS — R109 Unspecified abdominal pain: Secondary | ICD-10-CM | POA: Insufficient documentation

## 2011-07-15 DIAGNOSIS — M549 Dorsalgia, unspecified: Secondary | ICD-10-CM | POA: Insufficient documentation

## 2011-07-15 LAB — COMPREHENSIVE METABOLIC PANEL
ALT: 17 U/L (ref 0–35)
AST: 13 U/L (ref 0–37)
Albumin: 3.5 g/dL (ref 3.5–5.2)
Alkaline Phosphatase: 117 U/L (ref 39–117)
BUN: 9 mg/dL (ref 6–23)
CO2: 29 mEq/L (ref 19–32)
Calcium: 9.6 mg/dL (ref 8.4–10.5)
Chloride: 101 mEq/L (ref 96–112)
Creatinine, Ser: 0.73 mg/dL (ref 0.50–1.10)
GFR calc Af Amer: >60 mL/min (ref >60)
GFR calc non Af Amer: >60 mL/min (ref >60)
Glucose, Bld: 96 mg/dL (ref 70–99)
Potassium: 4.3 mEq/L (ref 3.5–5.1)
Sodium: 137 mEq/L (ref 135–145)
Total Bilirubin: 0.2 mg/dL — ABNORMAL LOW (ref 0.3–1.2)
Total Protein: 6.9 g/dL (ref 6.0–8.3)

## 2011-07-15 LAB — CBC
HCT: 42.1 % (ref 36.0–46.0)
Hemoglobin: 14.2 g/dL (ref 12.0–15.0)
MCH: 28.7 pg (ref 26.0–34.0)
MCHC: 33.7 g/dL (ref 30.0–36.0)
MCV: 85.1 fL (ref 78.0–100.0)
Platelets: 265 10*3/uL (ref 150–400)
RBC: 4.95 MIL/uL (ref 3.87–5.11)
RDW: 13.8 % (ref 11.5–15.5)
WBC: 7.7 10*3/uL (ref 4.0–10.5)

## 2011-07-15 LAB — DIFFERENTIAL
Basophils Absolute: 0 10*3/uL (ref 0.0–0.1)
Basophils Relative: 0 % (ref 0–1)
Eosinophils Absolute: 0.4 10*3/uL (ref 0.0–0.7)
Eosinophils Relative: 5 % (ref 0–5)
Lymphocytes Relative: 36 % (ref 12–46)
Lymphs Abs: 2.8 10*3/uL (ref 0.7–4.0)
Monocytes Absolute: 0.6 10*3/uL (ref 0.1–1.0)
Monocytes Relative: 8 % (ref 3–12)
Neutro Abs: 3.9 10*3/uL (ref 1.7–7.7)
Neutrophils Relative %: 50 % (ref 43–77)

## 2011-07-15 LAB — URINALYSIS, ROUTINE W REFLEX MICROSCOPIC
Bilirubin Urine: NEGATIVE
Glucose, UA: NEGATIVE mg/dL
Hgb urine dipstick: NEGATIVE
Ketones, ur: NEGATIVE mg/dL
Leukocytes, UA: NEGATIVE
Nitrite: NEGATIVE
Protein, ur: NEGATIVE mg/dL
Specific Gravity, Urine: 1.025 (ref 1.005–1.030)
Urobilinogen, UA: 0.2 mg/dL (ref 0.0–1.0)
pH: 7 (ref 5.0–8.0)

## 2011-07-15 LAB — PREGNANCY, URINE: Preg Test, Ur: NEGATIVE

## 2011-07-15 LAB — LIPASE, BLOOD: Lipase: 22 U/L (ref 11–59)

## 2011-07-15 MED ORDER — HYDROCODONE-ACETAMINOPHEN 5-325 MG PO TABS: 2.0000 | ORAL_TABLET | ORAL | Status: DC | PRN

## 2011-07-15 NOTE — ED Notes (Signed)
Pt c/o right sided flank pain. Pt states pain started a few days ago and has gotten worse.

## 2011-07-15 NOTE — ED Provider Notes (Signed)
History   Chart scribed for Isabel Horn, MD by Enos Fling; the patient was seen in room APA08/APA08; this patient's care was started at 10:42 AM.    CSN: [id number] Arrival date & time: 07/15/2011 10:05 AM  Chief Complaint  Patient presents with  . Flank Pain   HPI Isabel [name] is a 20 y.o. female who presents to the Emergency Department complaining of flank pain. Pt reports right flank pain/lower back pain, radiating to the right abd, onset 3-4 days ago. Pain has been waxing and waning but constant since onset and is worse with movement but better if lying still. +Decreased appetite for several days. Denies n/v/d/c, f/c, vaginal bleeding/discharge, or urinary complaints. Pt denies weakness, numbness, tingling, or bowel/bladder incontinence. Pt h/o cholecystectomy; still has appendix. No h/o hernias. Pt seen in ED recently for URI sx, states they are improving.  History reviewed. No pertinent past medical history.  Past Surgical History  Procedure Date  . Cholecystectomy     History reviewed. No pertinent family history.  History  Substance Use Topics  . Smoking status: Current Everyday Smoker -- 0.5 packs/day  . Smokeless tobacco: Not on file  . Alcohol Use: No    OB History    Grav Para Term Preterm Abortions TAB SAB Ect Mult Living                 Previous Medications   ETONOGESTREL (IMPLANON) 68 MG IMPL    Inject into the skin once. Due for replacement in 2014     Allergies as of 07/15/2011 - Review Complete 07/15/2011  Allergen Reaction Noted  . Amoxicillin Hives   . Penicillins Hives      Review of Systems  Constitutional: Positive for appetite change. Negative for fever.       10 Systems reviewed and are negative for acute change except as noted in the HPI.  HENT: Negative for congestion.   Eyes: Negative for discharge and redness.  Respiratory: Negative for cough and shortness of breath.   Cardiovascular: Negative for chest pain.    Gastrointestinal: Positive for abdominal pain. Negative for nausea, vomiting and diarrhea.  Genitourinary: Positive for flank pain. Negative for dysuria, hematuria, vaginal bleeding, vaginal discharge and pelvic pain.  Musculoskeletal: Negative for back pain.  Skin: Negative for rash.  Neurological: Negative for syncope, numbness and headaches.  Psychiatric/Behavioral:       No behavior change.    Physical Exam  BP 117/78  Pulse 61  Temp(Src) 98.7 F (37.1 C) (Oral)  Resp 18  Ht 5\' 4"  (1.626 m)  Wt 170 lb (77.111 kg)  BMI 29.18 kg/m2  SpO2 99%  Physical Exam  Nursing note and vitals reviewed. Constitutional:       Awake, alert, nontoxic appearance with baseline speech for patient.  HENT:  Head: Atraumatic.  Mouth/Throat: No oropharyngeal exudate.  Eyes: EOM are normal. Pupils are equal, round, and reactive to light. Right eye exhibits no discharge. Left eye exhibits no discharge.  Neck: Neck supple.  Cardiovascular: Normal rate and regular rhythm.   No murmur heard. Pulmonary/Chest: Effort normal and breath sounds normal. No stridor. No respiratory distress. She has no wheezes. She has no rales. She exhibits no tenderness.       No posterior chest wall tenderness  Abdominal: Soft. Bowel sounds are normal. She exhibits no mass. There is tenderness (entire right side rectus, worse with flexion, mild tenderness). There is no rebound and no tenderness at McBurney's point.  No CVA tenderness  Genitourinary: Cervix exhibits no motion tenderness. Right adnexum displays no tenderness. Left adnexum displays no tenderness.       Examination chaperoned by Enos Fling.  Musculoskeletal: She exhibits no tenderness.       Baseline ROM, moves extremities with no obvious new focal weakness. Right paralumbar soft tissue tenderness; SI nontender. Good CR.  Lymphadenopathy:    She has no cervical adenopathy.  Neurological:       Awake, alert, cooperative and aware of situation;  baseline gait without new ataxia.  Skin: No rash noted.  Psychiatric: She has a normal mood and affect.   Procedures - none  OTHER DATA REVIEWED: Nursing notes and vital signs reviewed. Prior records reviewed.   LABS / RADIOLOGY: Results for orders placed during the hospital encounter of 07/15/11  CBC      Component Value Range   WBC 7.7  4.0 - 10.5 (K/uL)   RBC 4.95  3.87 - 5.11 (MIL/uL)   Hemoglobin 14.2  12.0 - 15.0 (g/dL)   HCT 09.8  11.9 - 14.7 (%)   MCV 85.1  78.0 - 100.0 (fL)   MCH 28.7  26.0 - 34.0 (pg)   MCHC 33.7  30.0 - 36.0 (g/dL)   RDW 82.9  56.2 - 13.0 (%)   Platelets 265  150 - 400 (K/uL)  DIFFERENTIAL      Component Value Range   Neutrophils Relative 50  43 - 77 (%)   Neutro Abs 3.9  1.7 - 7.7 (K/uL)   Lymphocytes Relative 36  12 - 46 (%)   Lymphs Abs 2.8  0.7 - 4.0 (K/uL)   Monocytes Relative 8  3 - 12 (%)   Monocytes Absolute 0.6  0.1 - 1.0 (K/uL)   Eosinophils Relative 5  0 - 5 (%)   Eosinophils Absolute 0.4  0.0 - 0.7 (K/uL)   Basophils Relative 0  0 - 1 (%)   Basophils Absolute 0.0  0.0 - 0.1 (K/uL)  COMPREHENSIVE METABOLIC PANEL      Component Value Range   Sodium 137  135 - 145 (mEq/L)   Potassium 4.3  3.5 - 5.1 (mEq/L)   Chloride 101  96 - 112 (mEq/L)   CO2 29  19 - 32 (mEq/L)   Glucose, Bld 96  70 - 99 (mg/dL)   BUN 9  6 - 23 (mg/dL)   Creatinine, Ser 8.65  0.50 - 1.10 (mg/dL)   Calcium 9.6  8.4 - 78.4 (mg/dL)   Total Protein 6.9  6.0 - 8.3 (g/dL)   Albumin 3.5  3.5 - 5.2 (g/dL)   AST 13  0 - 37 (U/L)   ALT 17  0 - 35 (U/L)   Alkaline Phosphatase 117  39 - 117 (U/L)   Total Bilirubin 0.2 (*) 0.3 - 1.2 (mg/dL)   GFR calc non Af Amer >60  >60 (mL/min)   GFR calc Af Amer >60  >60 (mL/min)  LIPASE, BLOOD      Component Value Range   Lipase 22  11 - 59 (U/L)  URINALYSIS, ROUTINE W REFLEX MICROSCOPIC      Component Value Range   Color, Urine YELLOW  YELLOW    Appearance CLEAR  CLEAR    Specific Gravity, Urine 1.025  1.005 - 1.030    pH  7.0  5.0 - 8.0    Glucose, UA NEGATIVE  NEGATIVE (mg/dL)   Hgb urine dipstick NEGATIVE  NEGATIVE    Bilirubin Urine NEGATIVE  NEGATIVE  Ketones, ur NEGATIVE  NEGATIVE (mg/dL)   Protein, ur NEGATIVE  NEGATIVE (mg/dL)   Urobilinogen, UA 0.2  0.0 - 1.0 (mg/dL)   Nitrite NEGATIVE  NEGATIVE    Leukocytes, UA NEGATIVE  NEGATIVE   PREGNANCY, URINE      Component Value Range   Preg Test, Ur NEGATIVE       ED COURSE: Unchanged abd and back exams still mildly tender right side without rebound feel OutPt Obs at home overnight reasonable recheck ED tomorrow since has no PCP and Pt agrees.  MDM: I doubt any other EMC precluding discharge at this time including, but not necessarily limited to the following:SBI, peritonitis.     PLAN:  All results reviewed and discussed with pt, questions answered, pt agreeable with plan.      MEDS GIVEN IN ED: Medications - No data to display   DISCHARGE MEDICATIONS: New Prescriptions   No medications on file     SCRIBE ATTESTATION: I personally performed the services described in this documentation, which was scribed in my presence. The recorded information has been reviewed and considered. No att. providers found        Isabel Horn, MD 07/16/11 [phone]

## 2011-07-15 NOTE — ED Notes (Signed)
Patient does not need anything at this time. 

## 2011-07-16 ENCOUNTER — Encounter (HOSPITAL_COMMUNITY): Payer: Self-pay

## 2011-07-16 ENCOUNTER — Emergency Department (HOSPITAL_COMMUNITY): Payer: Medicaid Other

## 2011-07-16 ENCOUNTER — Emergency Department (HOSPITAL_COMMUNITY)
Admission: EM | Admit: 2011-07-16 | Discharge: 2011-07-16 | Disposition: A | Discharge status: Home / Self Care | Payer: Medicaid Other | Attending: Emergency Medicine | Admitting: Emergency Medicine

## 2011-07-16 DIAGNOSIS — R1031 Right lower quadrant pain: Secondary | ICD-10-CM | POA: Insufficient documentation

## 2011-07-16 DIAGNOSIS — R1013 Epigastric pain: Secondary | ICD-10-CM | POA: Insufficient documentation

## 2011-07-16 DIAGNOSIS — R109 Unspecified abdominal pain: Secondary | ICD-10-CM

## 2011-07-16 DIAGNOSIS — F17200 Nicotine dependence, unspecified, uncomplicated: Secondary | ICD-10-CM | POA: Insufficient documentation

## 2011-07-16 IMAGING — CT CT ABD-PELV W/O CM
2 of 3 series · 9 of 46 positions shown, 11 images · non-contrast
Comparison: None.

CLINICAL DATA: Abdominal pain, right flank pain

CT ABDOMEN AND PELVIS WITHOUT CONTRAST
TECHNIQUE: Multidetector CT imaging of the abdomen and pelvis was
performed following the standard protocol without intravenous
contrast.

[Series 4: mpr coronal (id) · coronal · 0.76mm/px · 8 of 66 slices shown, 9 images]
[im 8/66  soft-tissue]
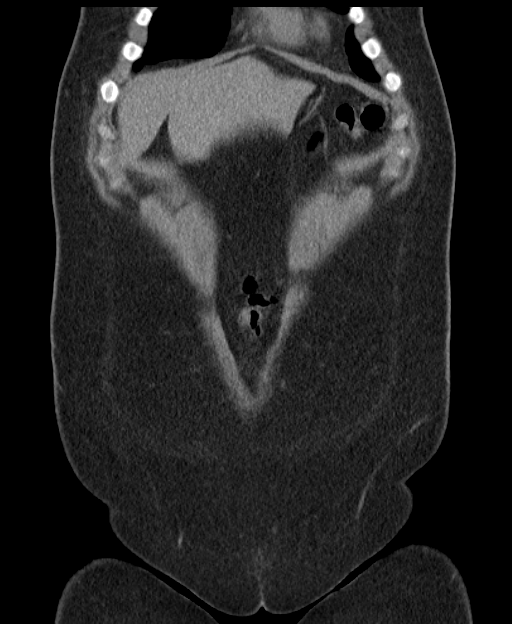
[im 8/66  bone]
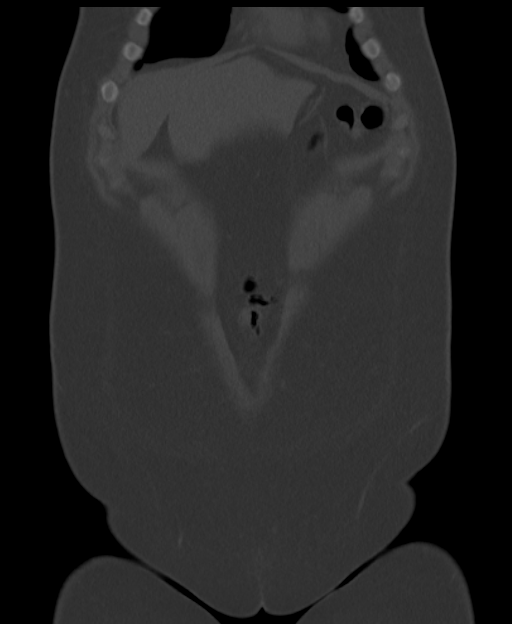
[im 15/66  soft-tissue]
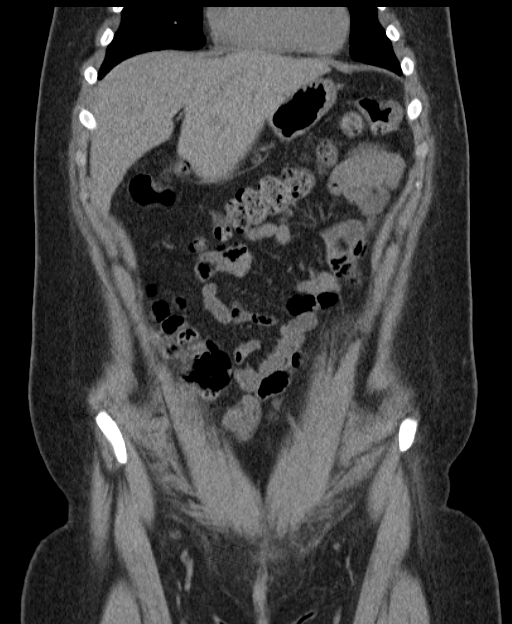
[im 22/66  soft-tissue]
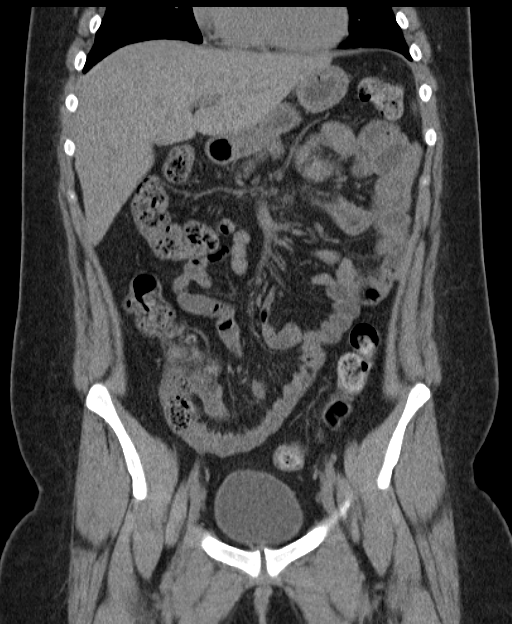
[im 29/66  soft-tissue]
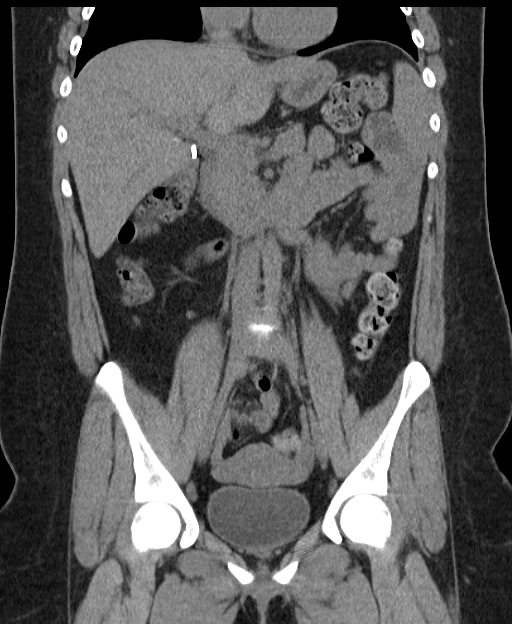
[im 37/66  soft-tissue]
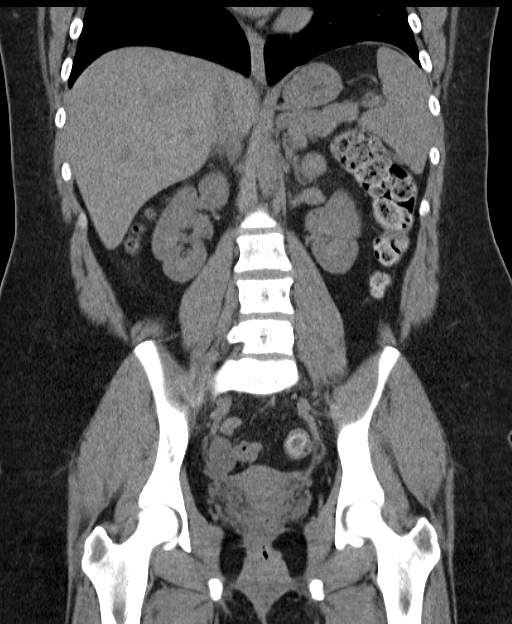
[im 44/66  soft-tissue]
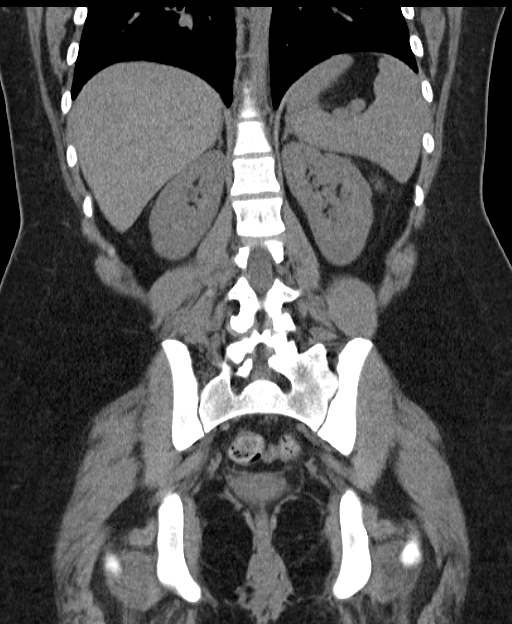
[im 51/66  soft-tissue]
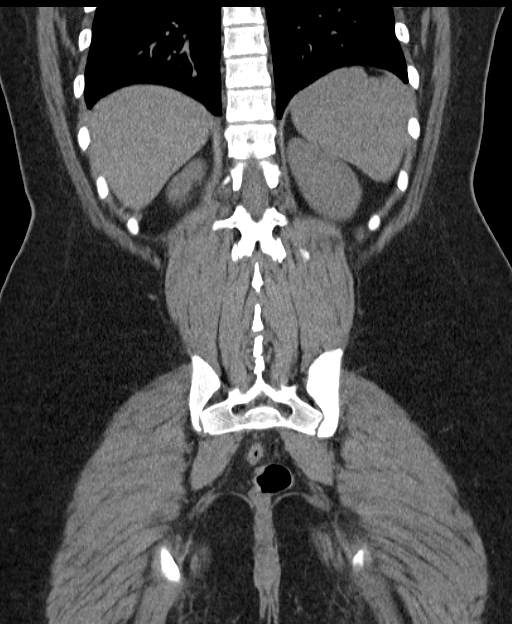
[im 58/66  soft-tissue]
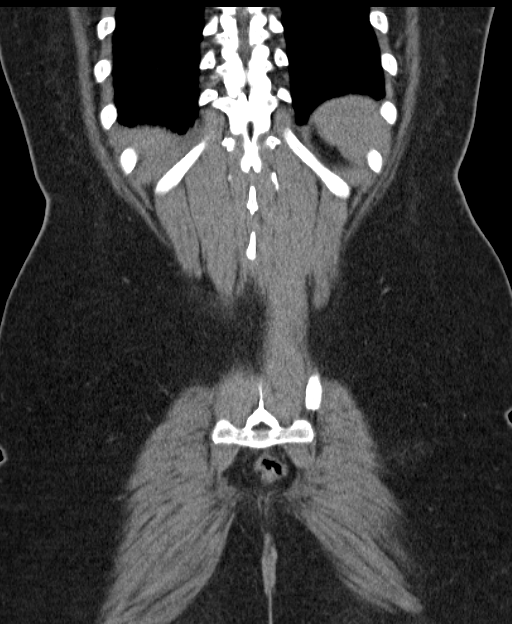

[Series 5: mpr sagittal (id) · sagittal · 0.53mm/px · 1 of 110 slices shown, 2 images]
[im 37/110  soft-tissue]
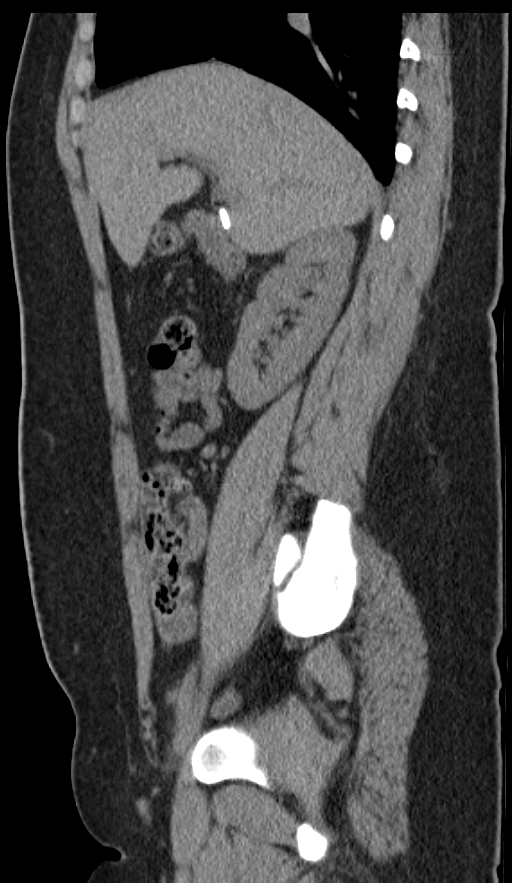
[im 37/110  bone]
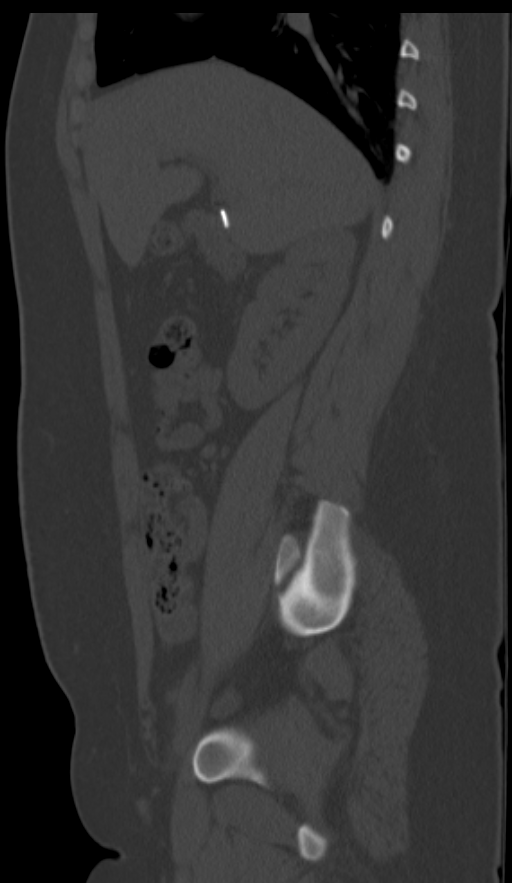

[9 of 46 positions shown; findings below may reference images not displayed]

FINDINGS: Lung bases are clear.  No pericardial fluid.

Non-IV contrast images demonstrate no focal hepatic lesion.  Post
cholecystectomy.  Pancreas, spleen, adrenal glands, and kidneys are
normal.  No nephrolithiasis or ureterolithiasis.

The stomach, small bowel, appendix, and cecum are normal.  The
colon and rectosigmoid colon normal.

Abdominal aorta normal caliber.  No retroperitoneal
lymphadenopathy.

No free fluid the pelvis.  Bladder, uterus, ovaries are normal.  No
pelvic lymphadenopathy. Review of  bone windows demonstrates no
aggressive osseous lesions.
IMPRESSION: 1.  No acute abdominal or pelvic process.
2.  Normal appendix.
3.  No obstructive uropathy.

## 2011-07-16 IMAGING — US US PELVIS COMPLETE
1 series · 14 of 25 positions shown · non-contrast
Comparison: None.

CLINICAL DATA: Right lower quadrant pain

TRANSABDOMINAL AND TRANSVAGINAL ULTRASOUND OF PELVIS
TECHNIQUE: Both transabdominal and transvaginal ultrasound
examinations of the pelvis were performed. Transabdominal technique
was performed for global imaging of the pelvis including uterus,
ovaries, adnexal regions, and pelvic cul-de-sac.

[Series 1: us pelvis complete · 0.21mm/px · 14 of 80 slices shown]
[im 1/80]
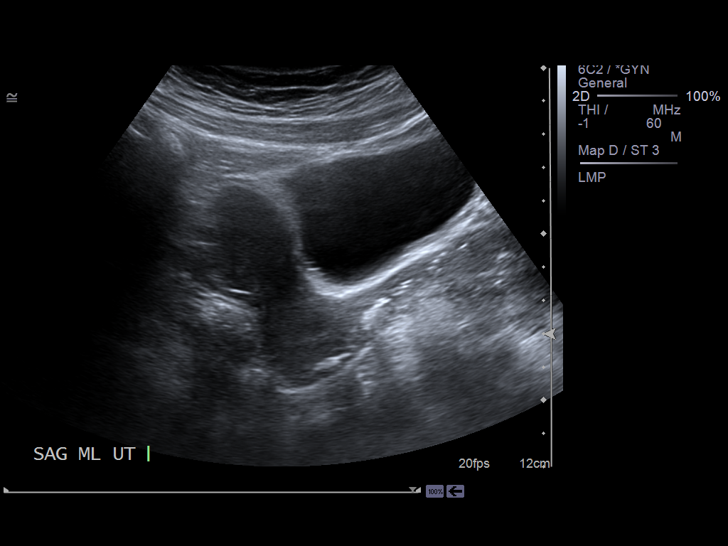
[im 7/80]
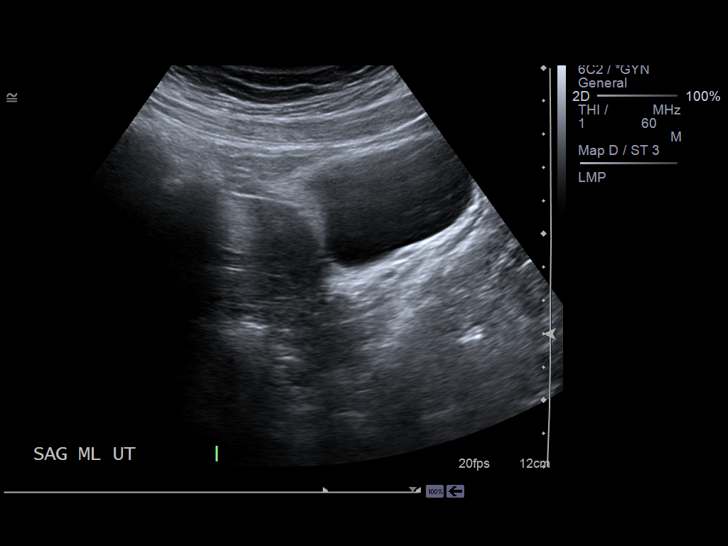
[im 14/80]
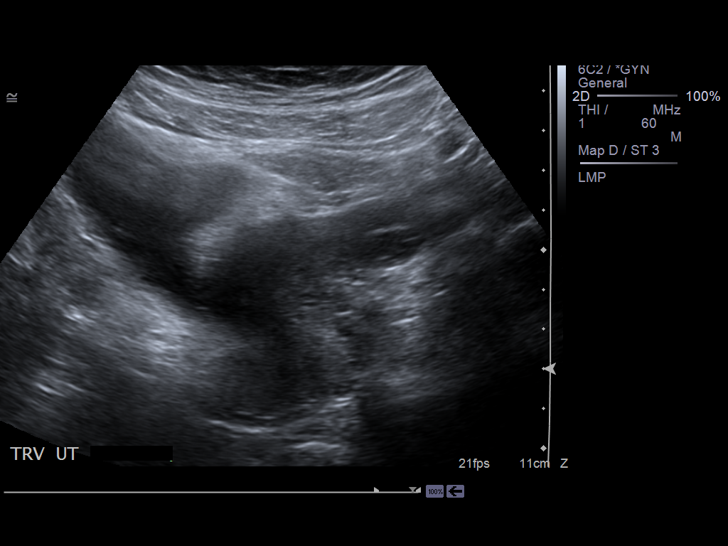
[im 20/80]
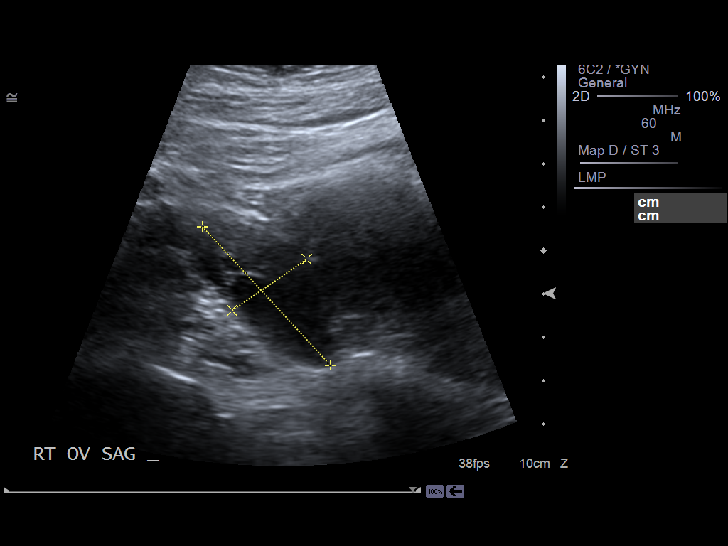
[im 27/80]
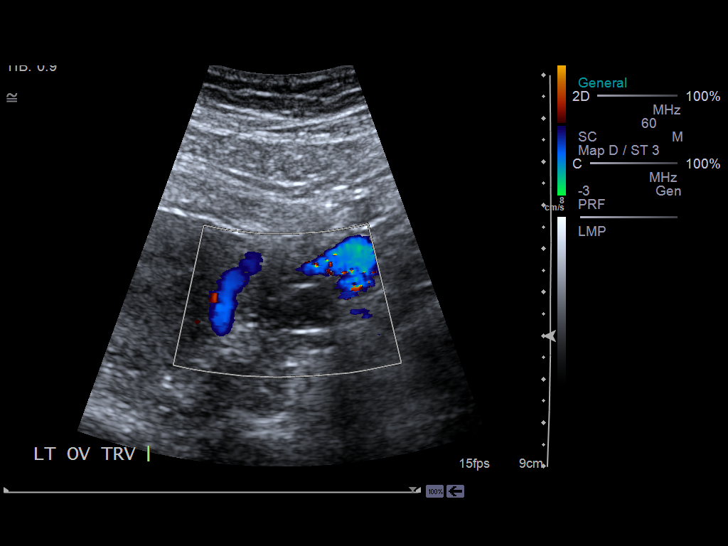
[im 30/80]
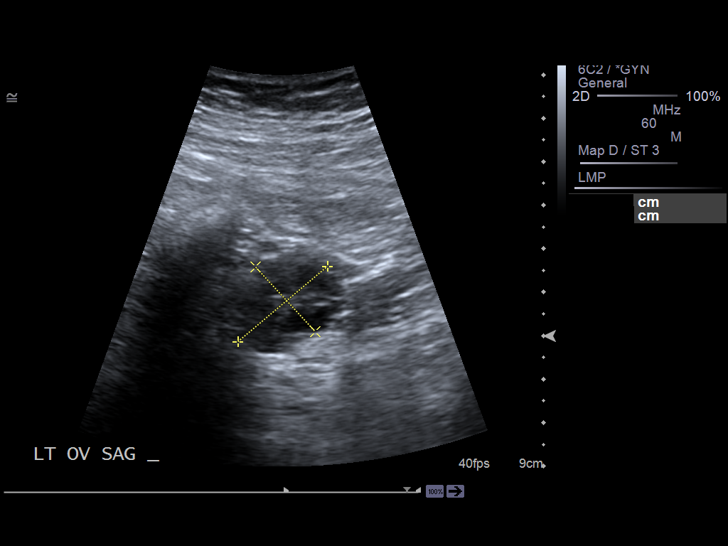
[im 37/80]
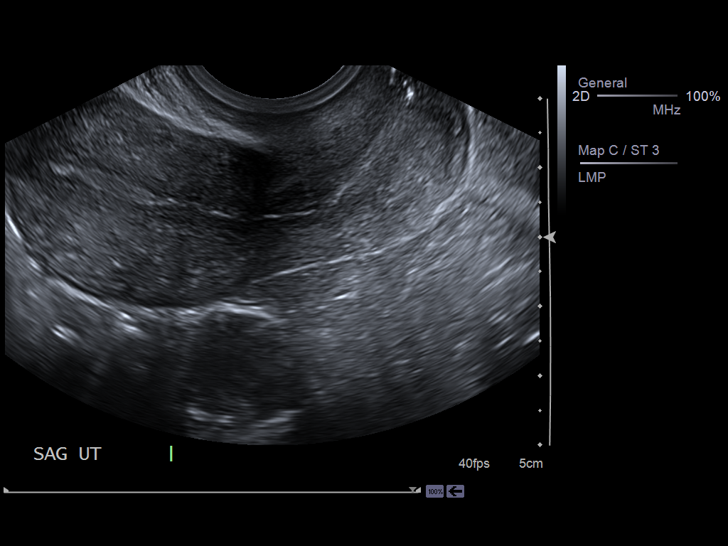
[im 43/80]
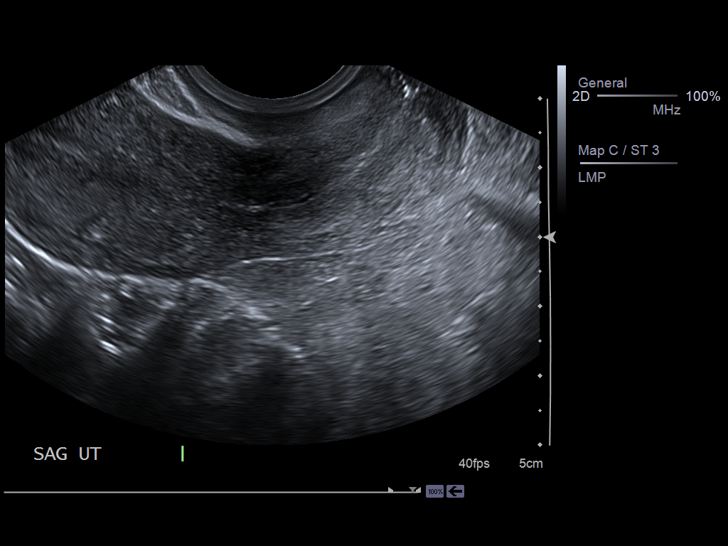
[im 50/80]
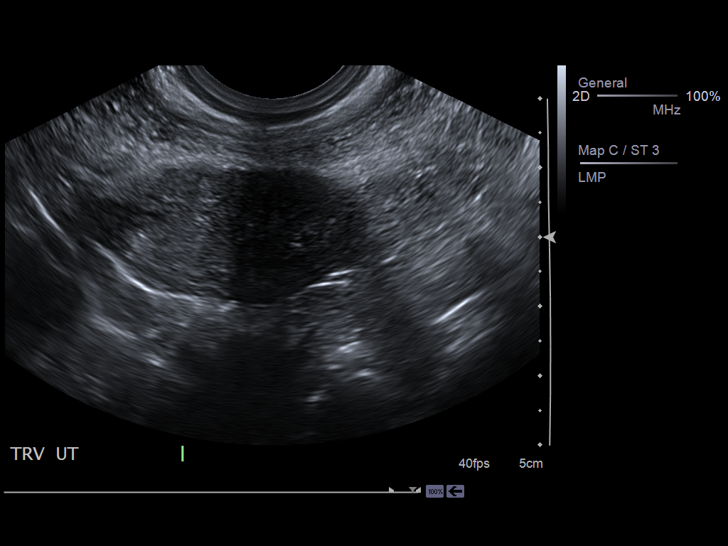
[im 53/80]
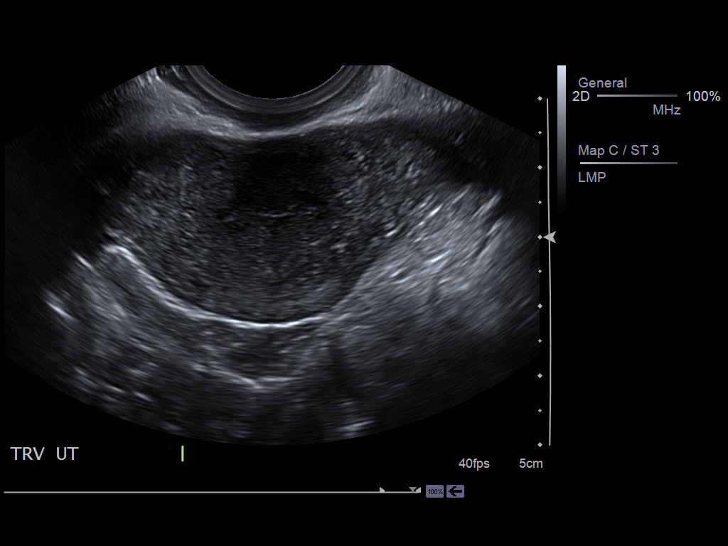
[im 60/80]
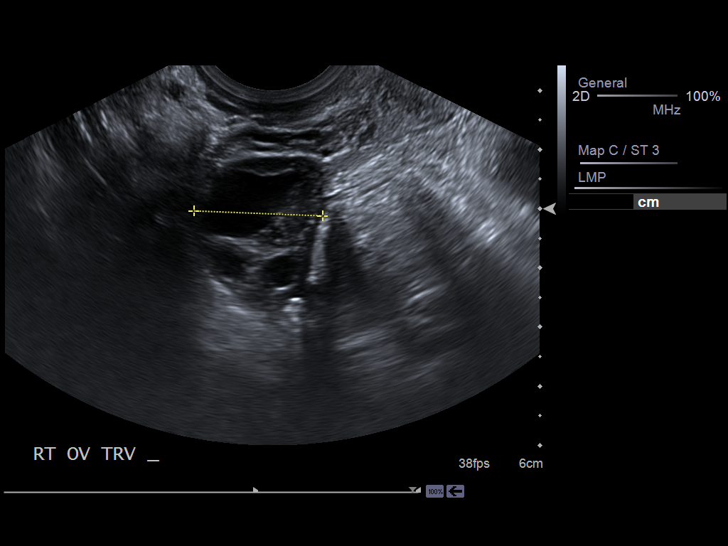
[im 66/80]
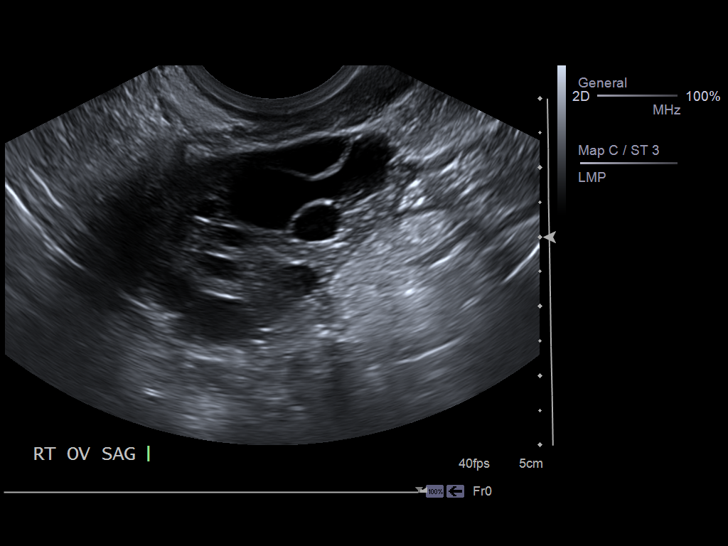
[im 73/80]
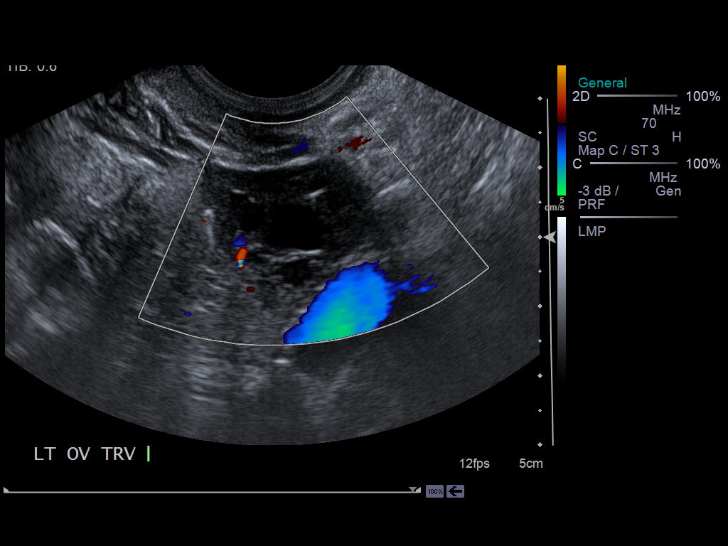
[im 80/80]
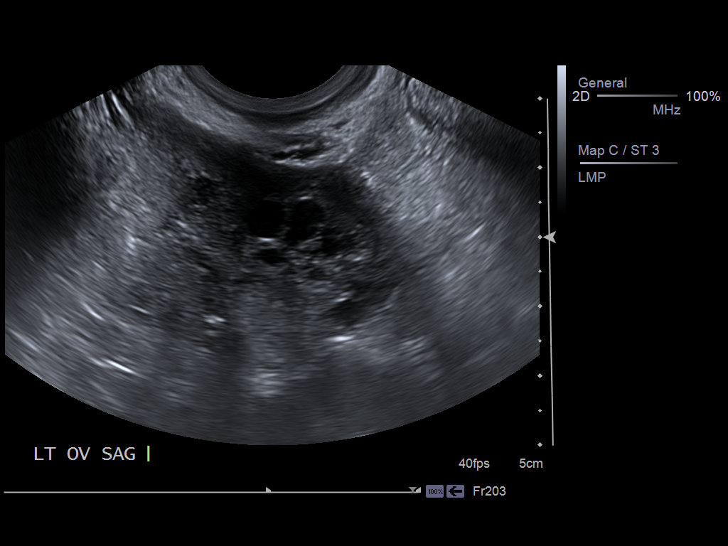

[14 of 25 positions shown; findings below may reference images not displayed]

It was necessary to proceed with endovaginal exam following the
transabdominal exam to visualize the adnexa.
FINDINGS: The uterus is normal in size and echotexture, measuring
7.0 x 2.9 x 4.7 cm.  Endometrial stripe is thin and homogeneous,
measuring 3 mm in width.

Both ovaries have a normal size and appearance.  The right ovary
measures 4.4 x 2.8 x 2.2 cm and contains a 2.5 cm simple cyst which
is within normal limits.  The left ovary measures 4.2 x 2.4 x
cm.  There are no adnexal masses or free pelvic fluid.
IMPRESSION: Normal pelvic ultrasound.

## 2011-07-16 MED ORDER — CYCLOBENZAPRINE HCL 5 MG PO TABS: 5.0000 mg | ORAL_TABLET | Freq: Three times a day (TID) | ORAL | Status: AC | PRN

## 2011-07-16 MED ORDER — KETOROLAC TROMETHAMINE 60 MG/2ML IM SOLN
60.0000 mg | Freq: Once | INTRAMUSCULAR | Status: AC
  Administered 2011-07-16: 60 mg via INTRAMUSCULAR
  Filled 2011-07-16: qty 2

## 2011-07-16 NOTE — ED Provider Notes (Signed)
History   Chart scribed for Ward Givens, MD by Enos Fling; the patient was seen in room APA17/[facility]17; this patient's care was started at 10:47 AM.   CSN: [id number] Arrival date & time: 07/16/2011 10:04 AM  Chief Complaint  Patient presents with  . Abdominal Pain   HPI Isabel [name] is a 20 y.o. female who presents to the Emergency Department complaining of back pain. Pt reports constant low right back pain radiating around right side to belly button onset 4 days ago. Pain is worse with bending over. Pain not worse with eating. No fever, n/v, dysuria, urinary frequency, or diarrhea. No cough or sob. Pt seen in ED yesterday and had normal labs, has been taking pain meds rx'd but no other meds. States the pain medication isn't working at all.  Pain was 10/10 this AM but improved to 7/10 currently after pain meds. Pt h/o back pain described as throbbing as well as h/o abd pain but this pain is different.  Pt h/o cholecystectomy d/t gallstones. Fh/o kidney stones. Pt does not have menstrual periods d/t implanon birth control x 1 year. PCP Dr. Lesia Hausen with [facility]  History reviewed. No pertinent past medical history. PT has hx of back pain (different from today)  Past Surgical History  Procedure Date  . Cholecystectomy     No family history on file. Kidney stones  History  Substance Use Topics  . Smoking status: Current Everyday Smoker -- 0.5 packs/day  . Smokeless tobacco: Not on file  . Alcohol Use: No  employed, has a one year old child  OB History    Grav Para Term Preterm Abortions TAB SAB Ect Mult Living                 Previous Medications   ETONOGESTREL (IMPLANON) 68 MG IMPL    Inject into the skin once. Due for replacement in 2014     Allergies as of 07/16/2011 - Review Complete 07/16/2011  Allergen Reaction Noted  . Amoxicillin Hives   . Penicillins Hives      Review of Systems 10 Systems reviewed and are negative for acute change except as noted in the  HPI.  Physical Exam  BP 110/63  Pulse 83  Temp(Src) 98 F (36.7 C) (Oral)  Resp 18  Ht 5\' 4"  (1.626 m)  Wt 175 lb (79.379 kg)  BMI 30.04 kg/m2  SpO2 97%  Physical Exam  Constitutional: She is oriented to person, place, and time. She appears well-developed and well-nourished.  HENT:  Head: Normocephalic and atraumatic.  Eyes: Conjunctivae and EOM are normal. Pupils are equal, round, and reactive to light.  Neck: Normal range of motion. Neck supple.  Cardiovascular: Normal rate and regular rhythm.   Pulmonary/Chest: Effort normal and breath sounds normal.  Abdominal: Soft. She exhibits no distension. There is tenderness. There is no rebound and no guarding.       Mildly tender in the RLQ and the right mid abd lateral to the umbilicus.   Musculoskeletal: Normal range of motion.  Neurological: She is alert and oriented to person, place, and time. She has normal reflexes.  Skin: Skin is warm and dry.  Psychiatric: She has a normal mood and affect. Her behavior is normal. Thought content normal.    ED Course  Procedures  OTHER DATA REVIEWED: Nursing notes and vital signs reviewed. Prior records reviewed.  LABS / RADIOLOGY:  Results for orders placed during the hospital encounter of 07/15/11  CBC  Component Value Range   WBC 7.7  4.0 - 10.5 (K/uL)   RBC 4.95  3.87 - 5.11 (MIL/uL)   Hemoglobin 14.2  12.0 - 15.0 (g/dL)   HCT 16.1  09.6 - 04.5 (%)   MCV 85.1  78.0 - 100.0 (fL)   MCH 28.7  26.0 - 34.0 (pg)   MCHC 33.7  30.0 - 36.0 (g/dL)   RDW 40.9  81.1 - 91.4 (%)   Platelets 265  150 - 400 (K/uL)  DIFFERENTIAL      Component Value Range   Neutrophils Relative 50  43 - 77 (%)   Neutro Abs 3.9  1.7 - 7.7 (K/uL)   Lymphocytes Relative 36  12 - 46 (%)   Lymphs Abs 2.8  0.7 - 4.0 (K/uL)   Monocytes Relative 8  3 - 12 (%)   Monocytes Absolute 0.6  0.1 - 1.0 (K/uL)   Eosinophils Relative 5  0 - 5 (%)   Eosinophils Absolute 0.4  0.0 - 0.7 (K/uL)   Basophils Relative 0   0 - 1 (%)   Basophils Absolute 0.0  0.0 - 0.1 (K/uL)  COMPREHENSIVE METABOLIC PANEL      Component Value Range   Sodium 137  135 - 145 (mEq/L)   Potassium 4.3  3.5 - 5.1 (mEq/L)   Chloride 101  96 - 112 (mEq/L)   CO2 29  19 - 32 (mEq/L)   Glucose, Bld 96  70 - 99 (mg/dL)   BUN 9  6 - 23 (mg/dL)   Creatinine, Ser 7.82  0.50 - 1.10 (mg/dL)   Calcium 9.6  8.4 - 95.6 (mg/dL)   Total Protein 6.9  6.0 - 8.3 (g/dL)   Albumin 3.5  3.5 - 5.2 (g/dL)   AST 13  0 - 37 (U/L)   ALT 17  0 - 35 (U/L)   Alkaline Phosphatase 117  39 - 117 (U/L)   Total Bilirubin 0.2 (*) 0.3 - 1.2 (mg/dL)   GFR calc non Af Amer >60  >60 (mL/min)   GFR calc Af Amer >60  >60 (mL/min)  LIPASE, BLOOD      Component Value Range   Lipase 22  11 - 59 (U/L)  URINALYSIS, ROUTINE W REFLEX MICROSCOPIC      Component Value Range   Color, Urine YELLOW  YELLOW    Appearance CLEAR  CLEAR    Specific Gravity, Urine 1.025  1.005 - 1.030    pH 7.0  5.0 - 8.0    Glucose, UA NEGATIVE  NEGATIVE (mg/dL)   Hgb urine dipstick NEGATIVE  NEGATIVE    Bilirubin Urine NEGATIVE  NEGATIVE    Ketones, ur NEGATIVE  NEGATIVE (mg/dL)   Protein, ur NEGATIVE  NEGATIVE (mg/dL)   Urobilinogen, UA 0.2  0.0 - 1.0 (mg/dL)   Nitrite NEGATIVE  NEGATIVE    Leukocytes, UA NEGATIVE  NEGATIVE   PREGNANCY, URINE      Component Value Range   Preg Test, Ur NEGATIVE     Labs from yesterday reviewed and are normal.    Ct Abdomen Pelvis Wo Contrast  07/16/2011  *RADIOLOGY REPORT*  Clinical Data: Abdominal pain, right flank pain  CT ABDOMEN AND PELVIS WITHOUT CONTRAST  Technique:  Multidetector CT imaging of the abdomen and pelvis was performed following the standard protocol without intravenous contrast.  Comparison: None.  Findings: Lung bases are clear.  No pericardial fluid.  Non-IV contrast images demonstrate no focal hepatic lesion.  Post cholecystectomy.  Pancreas, spleen, adrenal glands,  and kidneys are normal.  No nephrolithiasis or ureterolithiasis.   The stomach, small bowel, appendix, and cecum are normal.  The colon and rectosigmoid colon normal.  Abdominal aorta normal caliber.  No retroperitoneal lymphadenopathy.  No free fluid the pelvis.  Bladder, uterus, ovaries are normal.  No pelvic lymphadenopathy. Review of  bone windows demonstrates no aggressive osseous lesions.  IMPRESSION:  1.  No acute abdominal or pelvic process. 2.  Normal appendix. 3.  No obstructive uropathy.  Original Report Authenticated By: Genevive Bi, M.D.   US Transvaginal Non-ob  07/16/2011  *RADIOLOGY REPORT*  Clinical Data: Right lower quadrant pain  TRANSABDOMINAL AND TRANSVAGINAL ULTRASOUND OF PELVIS Technique:  Both transabdominal and transvaginal ultrasound examinations of the pelvis were performed. Transabdominal technique was performed for global imaging of the pelvis including uterus, ovaries, adnexal regions, and pelvic cul-de-sac.  Comparison: None.   It was necessary to proceed with endovaginal exam following the transabdominal exam to visualize the adnexa.  Findings: The uterus is normal in size and echotexture, measuring 7.0 x 2.9 x 4.7 cm.  Endometrial stripe is thin and homogeneous, measuring 3 mm in width.  Both ovaries have a normal size and appearance.  The right ovary measures 4.4 x 2.8 x 2.2 cm and contains a 2.5 cm simple cyst which is within normal limits.  The left ovary measures 4.2 x 2.4 x 2.6 cm.  There are no adnexal masses or free pelvic fluid.  IMPRESSION: Normal pelvic ultrasound.  Original Report Authenticated By: Brandon Melnick, M.D.   US Pelvis Complete  07/16/2011  *RADIOLOGY REPORT*  Clinical Data: Right lower quadrant pain  TRANSABDOMINAL AND TRANSVAGINAL ULTRASOUND OF PELVIS Technique:  Both transabdominal and transvaginal ultrasound examinations of the pelvis were performed. Transabdominal technique was performed for global imaging of the pelvis including uterus, ovaries, adnexal regions, and pelvic cul-de-sac.  Comparison: None.   It was  necessary to proceed with endovaginal exam following the transabdominal exam to visualize the adnexa.  Findings: The uterus is normal in size and echotexture, measuring 7.0 x 2.9 x 4.7 cm.  Endometrial stripe is thin and homogeneous, measuring 3 mm in width.  Both ovaries have a normal size and appearance.  The right ovary measures 4.4 x 2.8 x 2.2 cm and contains a 2.5 cm simple cyst which is within normal limits.  The left ovary measures 4.2 x 2.4 x 2.6 cm.  There are no adnexal masses or free pelvic fluid.  IMPRESSION: Normal pelvic ultrasound.  Original Report Authenticated By: Brandon Melnick, M.D.     ED COURSE: Pt given toradol which has helped her pain.  Waiting for CT of [facility] to be done.   3:09 PM - Discussed results with pt; she is comfortable with discharge, recommend muscle relaxants and follow up with GI.   MDM:   IMPRESSION: 1. Abdominal pain, unspecified site      PLAN: discharge All results reviewed and discussed with pt, questions answered, pt agreeable with plan.   CONDITION ON DISCHARGE: stable   MEDS GIVEN IN ED:  Medications  cyclobenzaprine (FLEXERIL) 5 MG tablet (not administered)  ketorolac (TORADOL) injection 60 mg (60 mg Intramuscular Given 07/16/11 1055)      DISCHARGE MEDICATIONS: New Prescriptions   CYCLOBENZAPRINE (FLEXERIL) 5 MG TABLET    Take 1 tablet (5 mg total) by mouth 3 (three) times daily as needed for muscle spasms.    I personally performed the services described in this documentation, which was scribed in my presence. The  recorded information has been reviewed and considered.  Devoria Albe, MD, Armando Gang     Ward Givens, MD 07/16/11 [phone]

## 2011-07-16 NOTE — ED Notes (Signed)
Pt reports R flank pain radiating around to abd x 3 or 4 days.  Denies pain with urination.  Denies any abnormal vaginal bleeding or discharge.

## 2011-07-16 NOTE — ED Notes (Signed)
edp at bedside prior to rn assessment, please see edp assessment for further.

## 2011-07-16 NOTE — ED Notes (Signed)
Pt states was evaluated here yesterday and was told to return to ED today if no better.  Pt says was told would need a ct scan today if pain not any better.

## 2011-07-27 ENCOUNTER — Emergency Department (HOSPITAL_COMMUNITY)
Admission: EM | Admit: 2011-07-27 | Discharge: 2011-07-27 | Disposition: A | Discharge status: Home / Self Care | Payer: Medicaid Other | Attending: Emergency Medicine | Admitting: Emergency Medicine

## 2011-07-27 DIAGNOSIS — H9209 Otalgia, unspecified ear: Secondary | ICD-10-CM | POA: Insufficient documentation

## 2011-07-27 DIAGNOSIS — J029 Acute pharyngitis, unspecified: Secondary | ICD-10-CM | POA: Insufficient documentation

## 2011-07-27 DIAGNOSIS — R059 Cough, unspecified: Secondary | ICD-10-CM | POA: Insufficient documentation

## 2011-07-27 DIAGNOSIS — R05 Cough: Secondary | ICD-10-CM | POA: Insufficient documentation

## 2011-07-27 DIAGNOSIS — H60399 Other infective otitis externa, unspecified ear: Secondary | ICD-10-CM | POA: Insufficient documentation

## 2011-07-27 DIAGNOSIS — J3489 Other specified disorders of nose and nasal sinuses: Secondary | ICD-10-CM | POA: Insufficient documentation

## 2011-08-20 LAB — COMPREHENSIVE METABOLIC PANEL
ALT: 17
AST: 15
Albumin: 3.7
Alkaline Phosphatase: 75
BUN: 6
CO2: 30
Calcium: 9.4
Chloride: 105
Creatinine, Ser: 0.67
Glucose, Bld: 93
Potassium: 4.3
Sodium: 140
Total Bilirubin: 0.5
Total Protein: 7.4

## 2011-08-20 LAB — PREGNANCY, URINE: Preg Test, Ur: NEGATIVE

## 2011-08-20 LAB — HEMOGLOBIN AND HEMATOCRIT, BLOOD
HCT: 40.7
Hemoglobin: 14

## 2011-08-24 LAB — POCT PREGNANCY, URINE
Operator id: 27065
Preg Test, Ur: NEGATIVE

## 2011-08-24 LAB — URINALYSIS, ROUTINE W REFLEX MICROSCOPIC
Bilirubin Urine: NEGATIVE
Glucose, UA: NEGATIVE
Hgb urine dipstick: NEGATIVE
Ketones, ur: 40 — AB
Nitrite: NEGATIVE
Protein, ur: NEGATIVE
Specific Gravity, Urine: 1.017
Urobilinogen, UA: 1
pH: 7

## 2011-08-27 LAB — DIFFERENTIAL
Basophils Absolute: 0.1
Basophils Relative: 1
Eosinophils Absolute: 0
Eosinophils Relative: 0
Lymphocytes Relative: 4 — ABNORMAL LOW
Lymphs Abs: 0.9 — ABNORMAL LOW
Monocytes Absolute: 0.3
Monocytes Relative: 2 — ABNORMAL LOW
Neutro Abs: 18.5 — ABNORMAL HIGH
Neutrophils Relative %: 94 — ABNORMAL HIGH

## 2011-08-27 LAB — URINALYSIS, ROUTINE W REFLEX MICROSCOPIC
Bilirubin Urine: NEGATIVE
Glucose, UA: NEGATIVE
Hgb urine dipstick: NEGATIVE
Ketones, ur: 40 — AB
Nitrite: NEGATIVE
Protein, ur: NEGATIVE
Specific Gravity, Urine: 1.027
Urobilinogen, UA: 0.2
pH: 7.5

## 2011-08-27 LAB — URINE CULTURE: Colony Count: 15000

## 2011-08-27 LAB — COMPREHENSIVE METABOLIC PANEL
ALT: 15
AST: 21
Albumin: 4.2
Alkaline Phosphatase: 91
BUN: 15
CO2: 24
Calcium: 9.4
Chloride: 103
Creatinine, Ser: 0.63
Glucose, Bld: 138 — ABNORMAL HIGH
Potassium: 4.1
Sodium: 136
Total Bilirubin: 0.9
Total Protein: 7.7

## 2011-08-27 LAB — CBC
HCT: 45.3 — ABNORMAL HIGH
Hemoglobin: 15.2 — ABNORMAL HIGH
MCHC: 33.6
MCV: 87.4
Platelets: 404
RBC: 5.18
RDW: 13.1
WBC: 19.8 — ABNORMAL HIGH

## 2011-08-27 LAB — POCT PREGNANCY, URINE
Operator id: 282201
Preg Test, Ur: NEGATIVE

## 2011-08-31 ENCOUNTER — Emergency Department (HOSPITAL_COMMUNITY)
Admission: EM | Admit: 2011-08-31 | Discharge: 2011-08-31 | Disposition: A | Discharge status: Home / Self Care | Payer: Medicaid Other | Attending: Emergency Medicine | Admitting: Emergency Medicine

## 2011-08-31 DIAGNOSIS — R062 Wheezing: Secondary | ICD-10-CM | POA: Insufficient documentation

## 2011-08-31 DIAGNOSIS — R05 Cough: Secondary | ICD-10-CM | POA: Insufficient documentation

## 2011-08-31 DIAGNOSIS — R233 Spontaneous ecchymoses: Secondary | ICD-10-CM | POA: Insufficient documentation

## 2011-08-31 DIAGNOSIS — J3489 Other specified disorders of nose and nasal sinuses: Secondary | ICD-10-CM | POA: Insufficient documentation

## 2011-08-31 DIAGNOSIS — J40 Bronchitis, not specified as acute or chronic: Secondary | ICD-10-CM | POA: Insufficient documentation

## 2011-08-31 DIAGNOSIS — R059 Cough, unspecified: Secondary | ICD-10-CM | POA: Insufficient documentation

## 2011-08-31 DIAGNOSIS — J029 Acute pharyngitis, unspecified: Secondary | ICD-10-CM | POA: Insufficient documentation

## 2011-08-31 LAB — RAPID STREP SCREEN (MED CTR MEBANE ONLY): Streptococcus, Group A Screen (Direct): NEGATIVE

## 2012-01-31 ENCOUNTER — Emergency Department (HOSPITAL_COMMUNITY)
Admission: EM | Admit: 2012-01-31 | Discharge: 2012-01-31 | Disposition: A | Discharge status: Home / Self Care | Payer: Medicaid Other | Attending: Emergency Medicine | Admitting: Emergency Medicine

## 2012-01-31 ENCOUNTER — Emergency Department (HOSPITAL_COMMUNITY): Payer: Medicaid Other

## 2012-01-31 ENCOUNTER — Encounter (HOSPITAL_COMMUNITY): Payer: Self-pay

## 2012-01-31 DIAGNOSIS — M542 Cervicalgia: Secondary | ICD-10-CM | POA: Insufficient documentation

## 2012-01-31 DIAGNOSIS — Z79899 Other long term (current) drug therapy: Secondary | ICD-10-CM | POA: Insufficient documentation

## 2012-01-31 DIAGNOSIS — M62838 Other muscle spasm: Secondary | ICD-10-CM | POA: Insufficient documentation

## 2012-01-31 DIAGNOSIS — Z9889 Other specified postprocedural states: Secondary | ICD-10-CM | POA: Insufficient documentation

## 2012-01-31 DIAGNOSIS — M25519 Pain in unspecified shoulder: Secondary | ICD-10-CM | POA: Insufficient documentation

## 2012-01-31 DIAGNOSIS — M436 Torticollis: Secondary | ICD-10-CM

## 2012-01-31 DIAGNOSIS — F17200 Nicotine dependence, unspecified, uncomplicated: Secondary | ICD-10-CM | POA: Insufficient documentation

## 2012-01-31 IMAGING — CR DG CERVICAL SPINE COMPLETE 4+V
5 series · 5 of 5 positions shown · non-contrast
Comparison: None.

CLINICAL DATA: Right sided neck spasms with torticollis.

CERVICAL SPINE - COMPLETE 4+ VIEW [DATE]:

[view not recorded (1 of 5)]
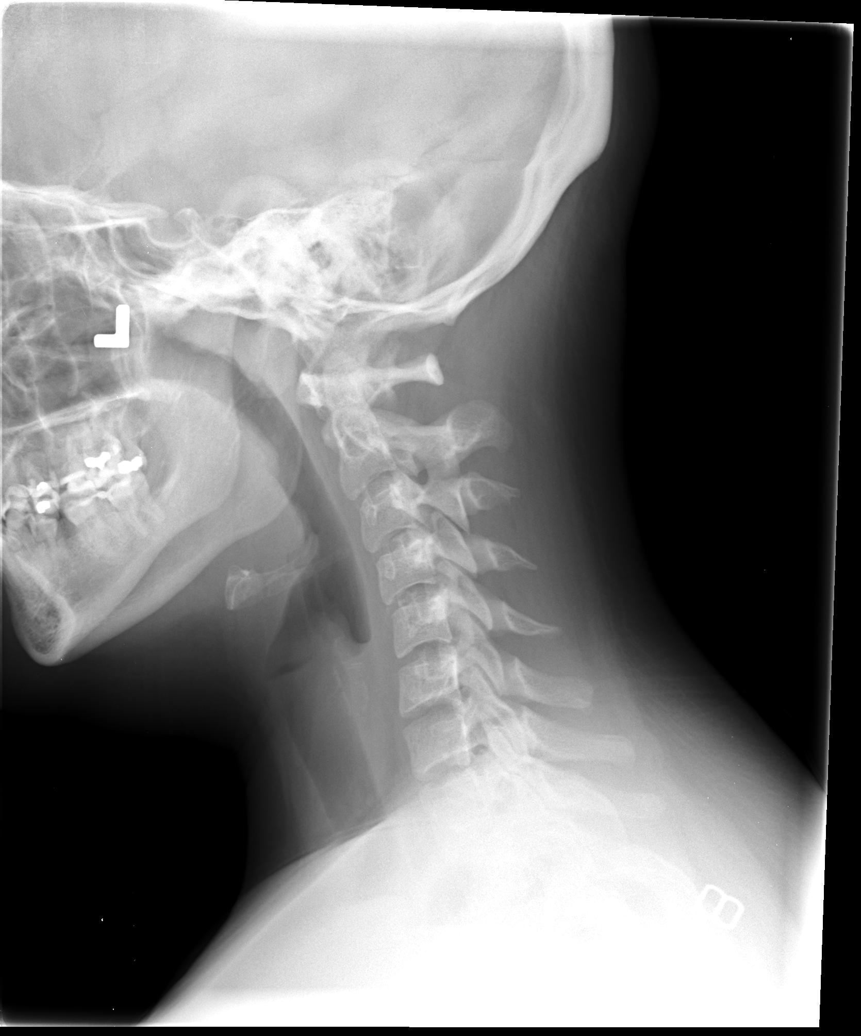

[view not recorded (2 of 5)]
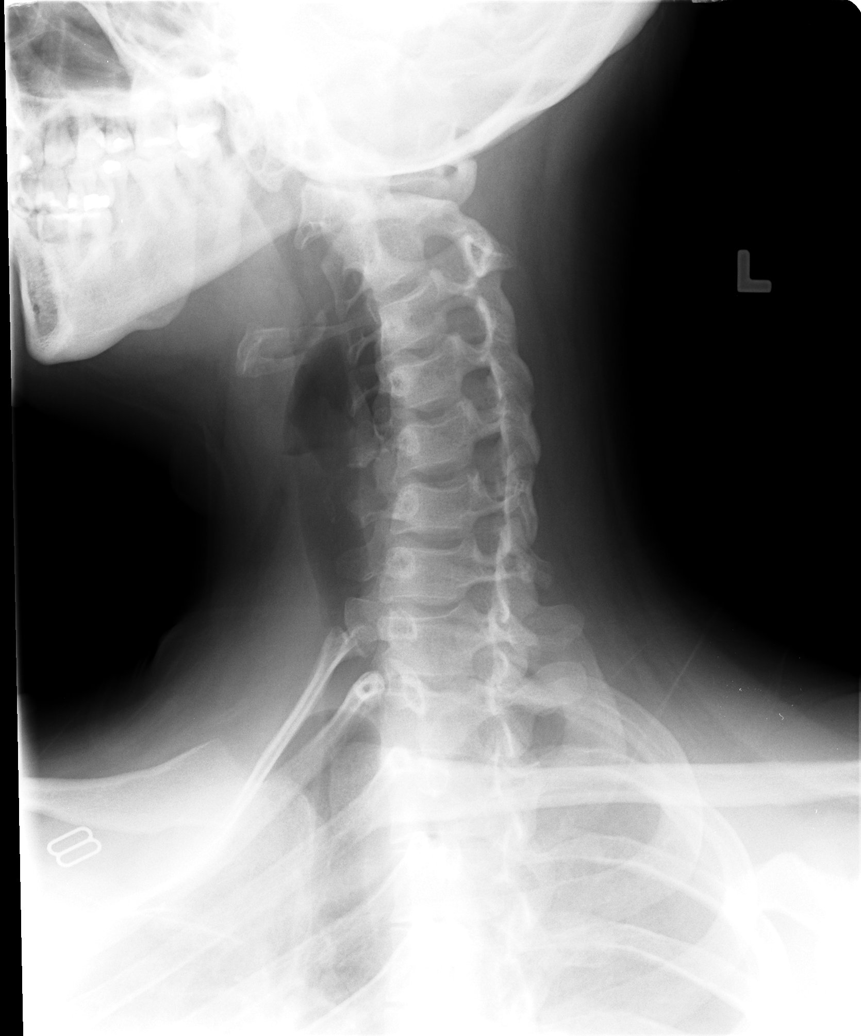

[view not recorded (3 of 5)]
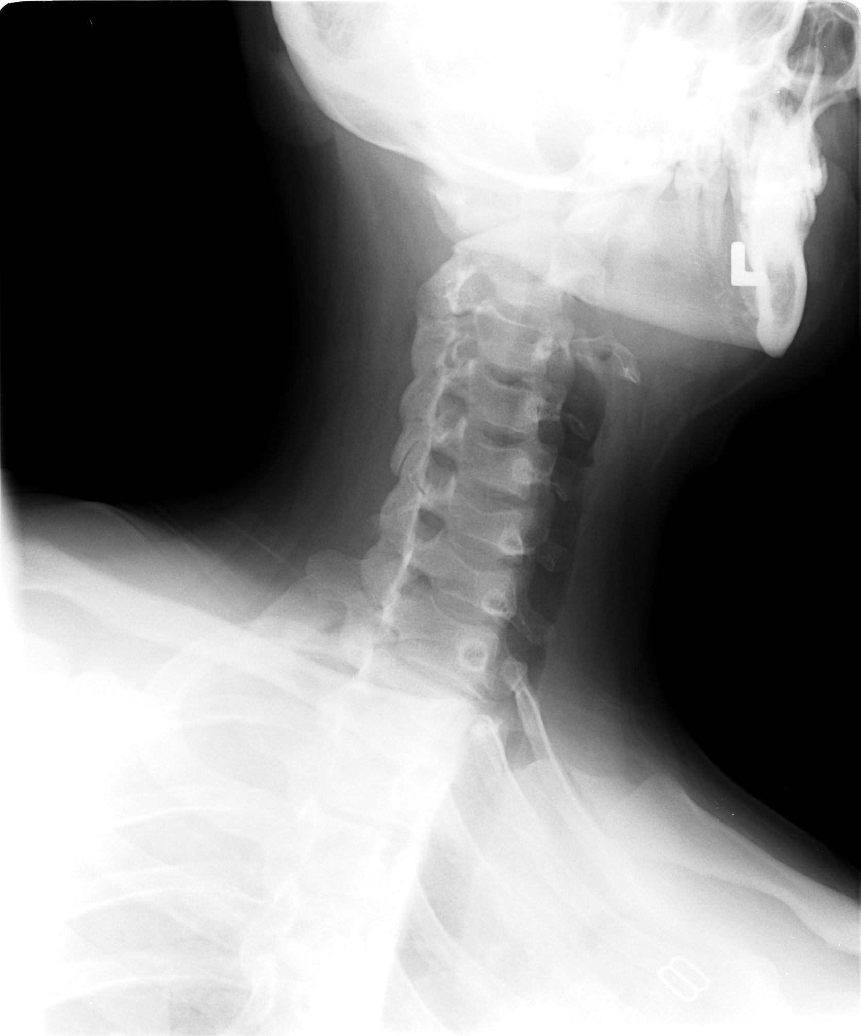

[view not recorded (4 of 5)]
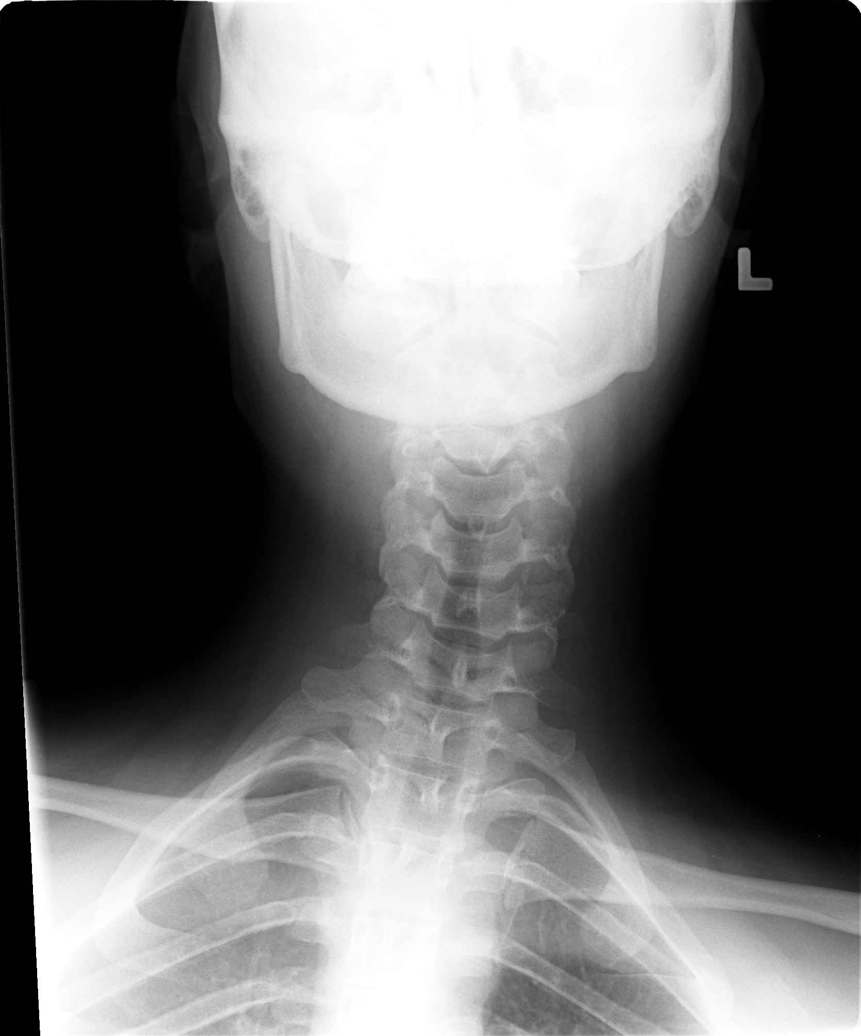

[view not recorded (5 of 5)]
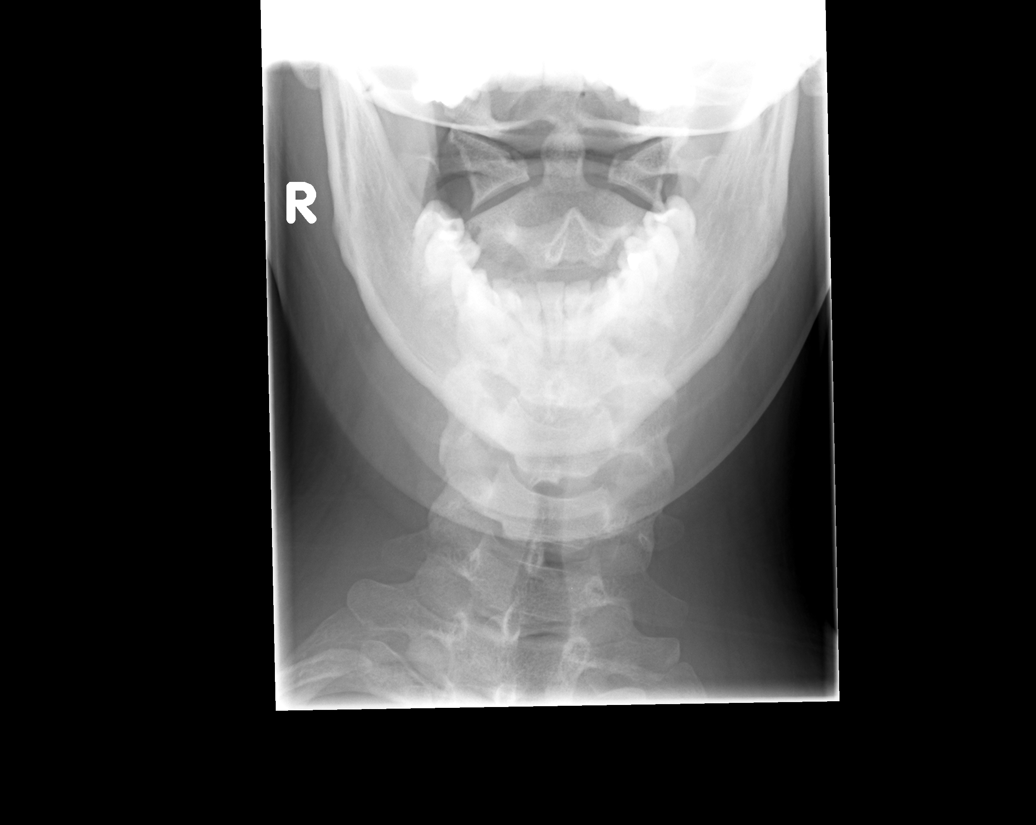

[5 of 5 positions shown; findings below may reference images not displayed]

FINDINGS: Reversal of the usual cervical lordosis with anatomic
posterior alignment.  Scoliosis convex left.  No visible fractures.
Normal prevertebral soft tissues.  Well-preserved disc spaces.
Facet joints intact.  No significant bony foraminal stenoses,
allowing for the degree of obliquity.  No static evidence of
instability.
IMPRESSION: Reversal of the usual lordosis and slight scoliosis convex left,
consistent with positioning and/or spasm.  Otherwise normal
examination.

## 2012-01-31 MED ORDER — FENTANYL CITRATE 0.05 MG/ML IJ SOLN
50.0000 ug | Freq: Once | INTRAMUSCULAR | Status: AC
  Administered 2012-01-31: 50 ug via INTRAMUSCULAR
  Filled 2012-01-31: qty 2

## 2012-01-31 MED ORDER — METHOCARBAMOL 500 MG PO TABS: ORAL_TABLET | ORAL | Status: DC

## 2012-01-31 MED ORDER — HYDROCODONE-ACETAMINOPHEN 7.5-325 MG PO TABS: 1.0000 | ORAL_TABLET | ORAL | Status: AC | PRN

## 2012-01-31 MED ORDER — DIAZEPAM 5 MG PO TABS
5.0000 mg | ORAL_TABLET | Freq: Once | ORAL | Status: AC
  Administered 2012-01-31: 5 mg via ORAL
  Filled 2012-01-31: qty 1

## 2012-01-31 MED ORDER — ONDANSETRON HCL 4 MG PO TABS
4.0000 mg | ORAL_TABLET | Freq: Once | ORAL | Status: AC
  Administered 2012-01-31: 4 mg via ORAL
  Filled 2012-01-31: qty 1

## 2012-01-31 NOTE — Discharge Instructions (Signed)
Please alternate heat and ice to the neck and shoulder. Norco and robaxin for pain and spasm. These medications may cause drowsiness. Please see Dr Hilda Lias for additional evaluation of your neck and shoulder, and recurrent muscle spasm problems.Torticollis, Acute You have suddenly (acutely) developed a twisted neck (torticollis). This is usually a self-limited condition. CAUSES  Acute torticollis may be caused by malposition, trauma or infection. Most commonly, acute torticollis is caused by sleeping in an awkward position. Torticollis may also be caused by the flexion, extension or twisting of the neck muscles beyond their normal position. Sometimes, the exact cause may not be known. SYMPTOMS  Usually, there is pain and limited movement of the neck. Your neck may twist to one side. DIAGNOSIS  The diagnosis is often made by physical examination. X-rays, CT scans or MRIs may be done if there is a history of trauma or concern of infection. TREATMENT  For a common, stiff neck that develops during sleep, treatment is focused on relaxing the contracted neck muscle. Medications (including shots) may be used to treat the problem. Most cases resolve in several days. Torticollis usually responds to conservative physical therapy. If left untreated, the shortened and spastic neck muscle can cause deformities in the face and neck. Rarely, surgery is required. HOME CARE INSTRUCTIONS   Use over-the-counter and prescription medications as directed by your caregiver.   Do stretching exercises and massage the neck as directed by your caregiver.   Follow up with physical therapy if needed and as directed by your caregiver.  SEEK IMMEDIATE MEDICAL CARE IF:   You develop difficulty breathing or noisy breathing (stridor).   You drool, develop trouble swallowing or have pain with swallowing.   You develop numbness or weakness in the hands or feet.   You have changes in speech or vision.   You have problems with  urination or bowel movements.   You have difficulty walking.   You have a fever.   You have increased pain.  MAKE SURE YOU:   Understand these instructions.   Will watch your condition.   Will get help right away if you are not doing well or get worse.  Document Released: 10/23/2000 Document Revised: 10/15/2011 Document Reviewed: 12/04/2009 [facility] Patient Information 2012 [place], [place].

## 2012-01-31 NOTE — ED Provider Notes (Signed)
History     CSN: [id number]  Arrival date & time 01/31/12  1017   First MD Initiated Contact with Patient 01/31/12 1048      Chief Complaint  Patient presents with  . Torticollis    (Consider location/radiation/quality/duration/timing/severity/associated sxs/prior treatment) HPI Comments: Pt reports problem with "stiff neck" since yesterday 3/23. This problem is worse today. No loss of use of the upper extremities. No difficulty walking.  The history is provided by the patient.    History reviewed. No pertinent past medical history.  Past Surgical History  Procedure Date  . Cholecystectomy     No family history on file.  History  Substance Use Topics  . Smoking status: Current Everyday Smoker -- 0.5 packs/day  . Smokeless tobacco: Not on file  . Alcohol Use: No    OB History    Grav Para Term Preterm Abortions TAB SAB Ect Mult Living                  Review of Systems  Constitutional: Negative for activity change.       All ROS Neg except as noted in HPI  HENT: Negative for nosebleeds and neck pain.   Eyes: Negative for photophobia and discharge.  Respiratory: Negative for cough, shortness of breath and wheezing.   Cardiovascular: Negative for chest pain and palpitations.  Gastrointestinal: Negative for abdominal pain and blood in stool.  Genitourinary: Negative for dysuria, frequency and hematuria.  Musculoskeletal: Negative for back pain and arthralgias.  Skin: Negative.   Neurological: Negative for dizziness, seizures and speech difficulty.  Psychiatric/Behavioral: Negative for hallucinations and confusion.    Allergies  Amoxicillin and Penicillins  Home Medications   Current Outpatient Rx  Name Route Sig Dispense Refill  . ETONOGESTREL 68 MG  IMPL Subcutaneous Inject into the skin once. Due for replacement in 2014    . HYDROCODONE-ACETAMINOPHEN 7.5-325 MG PO TABS Oral Take 1 tablet by mouth every 4 (four) hours as needed for pain. 20 tablet 0  .  METHOCARBAMOL 500 MG PO TABS  2 po tid for spasm 30 tablet 0    BP 123/79  Pulse 104  Temp(Src) 98.2 F (36.8 C) (Oral)  Resp 18  Ht 5\' 4"  (1.626 m)  Wt 160 lb (72.576 kg)  BMI 27.46 kg/m2  SpO2 100%  Physical Exam  Nursing note and vitals reviewed. Constitutional: She is oriented to person, place, and time. She appears well-developed and well-nourished.  Non-toxic appearance.  HENT:  Head: Normocephalic.  Right Ear: Tympanic membrane and external ear normal.  Left Ear: Tympanic membrane and external ear normal.  Eyes: EOM and lids are normal. Pupils are equal, round, and reactive to light.  Neck: Normal range of motion. Neck supple. Carotid bruit is not present.       Left posterior neck soreness.  Cardiovascular: Normal rate, regular rhythm, normal heart sounds, intact distal pulses and normal pulses.   Pulmonary/Chest: Breath sounds normal. No respiratory distress.  Abdominal: Soft. Bowel sounds are normal. There is no tenderness. There is no guarding.  Musculoskeletal: Normal range of motion.       Pain from left neck, extending into the left shoulder, with spasm.  Lymphadenopathy:       Head (right side): No submandibular adenopathy present.       Head (left side): No submandibular adenopathy present.    She has no cervical adenopathy.  Neurological: She is alert and oriented to person, place, and time. She has normal strength. No cranial  nerve deficit or sensory deficit.  Skin: Skin is warm and dry.  Psychiatric: She has a normal mood and affect. Her speech is normal.    ED Course  Procedures (including critical care time)  Labs Reviewed - No data to display Dg Cervical Spine Complete  01/31/2012  *RADIOLOGY REPORT*  Clinical Data: Right sided neck spasms with torticollis.  CERVICAL SPINE - COMPLETE 4+ VIEW 01/31/2012:  Comparison: None.  Findings: Reversal of the usual cervical lordosis with anatomic posterior alignment.  Scoliosis convex left.  No visible fractures.  Normal prevertebral soft tissues.  Well-preserved disc spaces. Facet joints intact.  No significant bony foraminal stenoses, allowing for the degree of obliquity.  No static evidence of instability.  IMPRESSION: Reversal of the usual lordosis and slight scoliosis convex left, consistent with positioning and/or spasm.  Otherwise normal examination.  Original Report Authenticated By: Arnell Sieving, M.D.     1. Torticollis       MDM  I have reviewed nursing notes, vital signs, and all appropriate lab and imaging results for this patient.  C-Spine xray is negative for fracture or sublux.  Rx for Norco and Robaxin given.      Kathie Dike, [place] 02/02/12 [phone]

## 2012-01-31 NOTE — ED Notes (Addendum)
Reports she was playing with her child and while standing patient states her neck "stiffened". Patient reports similar symptoms in the past and that symptoms were relieved with time. Denies fever, recent illness.

## 2012-01-31 NOTE — ED Notes (Signed)
Pt stated that she started having stiff neck yesterday

## 2012-01-31 NOTE — ED Notes (Signed)
Pt a/ox4. Resp even and unlabored. NAD at this time. D/C instructions reviewed with pt. Pt verbalized understanding. Pt ambulated to lobby with steady gate.  

## 2012-02-03 NOTE — ED Provider Notes (Signed)
Medical screening examination/treatment/procedure(s) were performed by non-physician practitioner and as supervising physician I was immediately available for consultation/collaboration.    Joya Gaskins, MD 02/03/12 0730

## 2012-02-22 ENCOUNTER — Encounter: Payer: Self-pay | Admitting: Orthopedic Surgery

## 2012-02-22 ENCOUNTER — Ambulatory Visit (INDEPENDENT_AMBULATORY_CARE_PROVIDER_SITE_OTHER): Payer: Medicaid Other | Admitting: Orthopedic Surgery

## 2012-02-22 VITALS — BP 110/60 | Ht 64.0 in | Wt 160.0 lb

## 2012-02-22 DIAGNOSIS — M542 Cervicalgia: Secondary | ICD-10-CM | POA: Insufficient documentation

## 2012-02-22 MED ORDER — PREDNISONE (PAK) 5 MG PO TABS: 5.0000 mg | ORAL_TABLET | ORAL | Status: DC

## 2012-02-22 MED ORDER — IBUPROFEN 800 MG PO TABS: 800.0000 mg | ORAL_TABLET | Freq: Three times a day (TID) | ORAL | Status: AC | PRN

## 2012-02-22 MED ORDER — METHOCARBAMOL 750 MG PO TABS: ORAL_TABLET | ORAL | Status: DC

## 2012-02-22 NOTE — Progress Notes (Signed)
  Subjective:     Isabel [name] is a 21 y.o. female who presents for evaluation of neck pain.she reports that her symptoms started in her neck with sudden onset and radiate down into the shoulder blade and upper RIGHT shoulder joint.  The pain is described as throbbing, stabbing, burning constant with 6/10 pain all day unrelieved by Flexeril or Robaxin.  Its associated with some tingling locking and catching and stiffness in the cervical spine  She reports excessive urination and seasonal ALLERGIES and systems otherwise negative   Previous treatments: none.  The following portions of the patient's history were reviewed and updated as appropriate: allergies, current medications, past family history, past medical history, past social history, past surgical history and problem list.  Review of Systems Pertinent items are noted in HPI.    Objective:    BP 110/60  Ht 5\' 4"  (1.626 m)  Wt 160 lb (72.576 kg)  BMI 27.46 kg/m2 General:   alert, cooperative, appears stated age and no distress  External Deformity:  absent  ROM Cervical Spine:  Abnormal cervical spine range of motion decreased flexion decreased extension decreased rotation  Midline Tenderness:  moderate midline  Paraspinous tenderness:  moderate on the right  UE Neurologic Exam:  normal strength, normal sensation, normal reflexes   X-ray of the cervical spine: Normal    Assessment:    Cervical pain    Plan:    PT referral.

## 2012-02-22 NOTE — Patient Instructions (Signed)
Start therapy Three meds sent to your pharmacy

## 2012-02-23 ENCOUNTER — Other Ambulatory Visit: Payer: Self-pay | Admitting: *Deleted

## 2012-02-23 DIAGNOSIS — M542 Cervicalgia: Secondary | ICD-10-CM

## 2012-03-01 ENCOUNTER — Ambulatory Visit (HOSPITAL_COMMUNITY): Payer: Medicaid Other | Admitting: Physical Therapy

## 2012-04-01 ENCOUNTER — Emergency Department (HOSPITAL_COMMUNITY)
Admission: EM | Admit: 2012-04-01 | Discharge: 2012-04-01 | Disposition: A | Discharge status: Home / Self Care | Payer: Medicaid Other | Attending: Emergency Medicine | Admitting: Emergency Medicine

## 2012-04-01 ENCOUNTER — Encounter (HOSPITAL_COMMUNITY): Payer: Self-pay | Admitting: *Deleted

## 2012-04-01 DIAGNOSIS — Z0000 Encounter for general adult medical examination without abnormal findings: Secondary | ICD-10-CM

## 2012-04-01 DIAGNOSIS — N949 Unspecified condition associated with female genital organs and menstrual cycle: Secondary | ICD-10-CM | POA: Insufficient documentation

## 2012-04-01 NOTE — ED Notes (Signed)
Reports inserted tampon into vagina last night, reports woke up this morning and is unable to retrieve it.  C/o mild pelvic pain.

## 2012-04-01 NOTE — ED Provider Notes (Signed)
History     CSN: [id number]  Arrival date & time 04/01/12  1000   First MD Initiated Contact with Patient 04/01/12 1011      Chief Complaint  Patient presents with  . Foreign Body in Vagina    (Consider location/radiation/quality/duration/timing/severity/associated sxs/prior treatment) HPI Comments: Patient comes to ED requesting evaluation for a possible retained tampon in her vagina.  States she placed a tampon on the evening prior to ed visit and states when she voided this morning the tampon was no longer in place.  She denies any symptoms at this time  Patient is a 21 y.o. female presenting with foreign body in vagina. The history is provided by the patient.  Foreign Body in Vagina This is a new problem. Episode onset: evening prior to ED arrival. The problem has been unchanged. Pertinent negatives include no abdominal pain, arthralgias, chills, fever, myalgias, nausea, numbness, rash, swollen glands, vomiting or weakness. The symptoms are aggravated by nothing. She has tried nothing for the symptoms.    History reviewed. No pertinent past medical history.  Past Surgical History  Procedure Date  . Cholecystectomy     Family History  Problem Relation Age of Onset  . Arthritis    . Cancer    . Diabetes      History  Substance Use Topics  . Smoking status: Current Everyday Smoker -- 0.5 packs/day    Types: Cigarettes  . Smokeless tobacco: Not on file  . Alcohol Use: No    OB History    Grav Para Term Preterm Abortions TAB SAB Ect Mult Living                  Review of Systems  Constitutional: Negative for fever and chills.  Gastrointestinal: Negative for nausea, vomiting, abdominal pain and blood in stool.  Genitourinary: Negative for dysuria, frequency, hematuria, vaginal bleeding, vaginal discharge, genital sores, vaginal pain and menstrual problem.  Musculoskeletal: Negative for myalgias, back pain and arthralgias.  Skin: Negative for rash.  Neurological:  Negative for dizziness, weakness and numbness.  Hematological: Does not bruise/bleed easily.    Allergies  Amoxicillin and Penicillins  Home Medications   Current Outpatient Rx  Name Route Sig Dispense Refill  . METHOCARBAMOL 750 MG PO TABS  One tablet by mouth every six hours as needed 56 tablet 2  . ETONOGESTREL 68 MG [place] IMPL Subcutaneous Inject into the skin once. Due for replacement in 2014      BP 121/80  Pulse 104  Temp(Src) 98.5 F (36.9 C) (Oral)  Resp 16  Ht 5\' 4"  (1.626 m)  Wt 160 lb (72.576 kg)  BMI 27.46 kg/m2  SpO2 98%  Physical Exam  Nursing note and vitals reviewed. Constitutional: She is oriented to person, place, and time. She appears well-developed and well-nourished. No distress.  Cardiovascular: Normal rate, regular rhythm and normal heart sounds.   Pulmonary/Chest: Effort normal and breath sounds normal. No respiratory distress. She exhibits no tenderness.  Abdominal: Soft. She exhibits no distension and no mass. There is no tenderness. There is no rebound and no guarding.  Genitourinary: Vagina normal and uterus normal. Cervix exhibits no motion tenderness. Right adnexum displays no mass and no tenderness. Left adnexum displays no mass and no tenderness.       Vagina appears normal. Mild bleeding is present from the cervix. Cervical os is closed. No foreign bodies were seen or palpated on pelvic exam. No adnexal tenderness or masses. No vaginal discharge.  No cervical motion  tenderness.  Musculoskeletal: Normal range of motion. She exhibits no tenderness.  Lymphadenopathy:    She has no cervical adenopathy.  Neurological: She is alert and oriented to person, place, and time.  Skin: Skin is warm and dry.    ED Course  Procedures (including critical care time)       MDM   Patient is alert. Vital signs are stable. No acute distress. Abdomen remains soft and nontender. No foreign bodies are present in the vagina. Patient agrees to followup with her  gynecologist or to return to the ER if needed   Patient / Family / Caregiver understand and agree with initial ED impression and plan with expectations set for ED visit. Pt stable in ED with no significant deterioration in condition. Pt feels improved after observation and/or treatment in ED.     The patient appears reasonably screened and/or stabilized for discharge and I doubt any other medical condition or other [facility] requiring further screening, evaluation, or treatment in the ED at this time prior to discharge.      Anjalina Bergevin L. Floella Ensz, [place] 04/07/12 1204

## 2012-04-01 NOTE — Discharge Instructions (Signed)
Normal Exam, Adult   You were seen and examined today in our facility. Our caregiver found nothing wrong on the exam. If testing was done such as lab work or x-rays, they did not indicate enough wrong to suggest that treatment should be given. The caregiver then must decide after testing is finished if your concern is a physical problem or illness that needs treatment. Today no treatable problem was found. Even if reassurance was given, if you feel you are getting worse when you get home make sure you call back or return to our department.   For the protection of your privacy, test results can not be given over the phone. Make sure you receive the results of your test. Ask as to how these results are to be obtained if you have not been informed. It is your responsibility to obtain your test results.   Your condition can change over time. Sometimes it takes more than one visit to determine the cause of your symptoms. It is important that you monitor your condition for any changes.   SEEK IMMEDIATE MEDICAL CARE IF:   You develop an oral temperature above 102° F (38.9° C), which lasts for more than 2 days, not controlled by medications. Only take over-the-counter or prescription medicines for pain, discomfort, or fever as directed by your caregiver.   You develop a loss of appetite or start throwing up (vomiting).   You develop a rash, cough, belly (abdominal) pain, earache, headache, or develop pain in neck, muscles, or joints.   The problem or problems which brought you to our facility become worse or are a cause of more concern.   If we have told you today your exam and tests are normal, and a short while later you feel this is not right, please seek medical attention so you may be rechecked.   Document Released: 02/07/2001 Document Revised: 10/15/2011 Document Reviewed: 06/01/2008   [employer]® Patient Information ©2012 [employer].

## 2012-04-06 ENCOUNTER — Ambulatory Visit: Payer: Medicaid Other | Admitting: Orthopedic Surgery

## 2012-04-06 ENCOUNTER — Encounter: Payer: Self-pay | Admitting: Orthopedic Surgery

## 2012-04-10 NOTE — ED Provider Notes (Signed)
Medical screening examination/treatment/procedure(s) were performed by non-physician practitioner and as supervising physician I was immediately available for consultation/collaboration.  Donnetta Hutching, MD 04/10/12 [phone]

## 2012-12-12 ENCOUNTER — Emergency Department (HOSPITAL_COMMUNITY)
Admission: EM | Admit: 2012-12-12 | Discharge: 2012-12-12 | Disposition: A | Discharge status: Home / Self Care | Payer: Medicaid Other | Attending: Emergency Medicine | Admitting: Emergency Medicine

## 2012-12-12 ENCOUNTER — Encounter (HOSPITAL_COMMUNITY): Payer: Self-pay | Admitting: *Deleted

## 2012-12-12 DIAGNOSIS — Z9089 Acquired absence of other organs: Secondary | ICD-10-CM | POA: Insufficient documentation

## 2012-12-12 DIAGNOSIS — R110 Nausea: Secondary | ICD-10-CM | POA: Insufficient documentation

## 2012-12-12 DIAGNOSIS — Z79899 Other long term (current) drug therapy: Secondary | ICD-10-CM | POA: Insufficient documentation

## 2012-12-12 DIAGNOSIS — N898 Other specified noninflammatory disorders of vagina: Secondary | ICD-10-CM | POA: Insufficient documentation

## 2012-12-12 DIAGNOSIS — R109 Unspecified abdominal pain: Secondary | ICD-10-CM | POA: Insufficient documentation

## 2012-12-12 DIAGNOSIS — Z3202 Encounter for pregnancy test, result negative: Secondary | ICD-10-CM | POA: Insufficient documentation

## 2012-12-12 DIAGNOSIS — F17200 Nicotine dependence, unspecified, uncomplicated: Secondary | ICD-10-CM | POA: Insufficient documentation

## 2012-12-12 LAB — URINALYSIS, ROUTINE W REFLEX MICROSCOPIC
Bilirubin Urine: NEGATIVE
Glucose, UA: NEGATIVE mg/dL
Hgb urine dipstick: NEGATIVE
Ketones, ur: NEGATIVE mg/dL
Leukocytes, UA: NEGATIVE
Nitrite: NEGATIVE
Protein, ur: NEGATIVE mg/dL
Specific Gravity, Urine: 1.02 (ref 1.005–1.030)
Urobilinogen, UA: 0.2 mg/dL (ref 0.0–1.0)
pH: 6.5 (ref 5.0–8.0)

## 2012-12-12 LAB — WET PREP, GENITAL
Trich, Wet Prep: NONE SEEN
Yeast Wet Prep HPF POC: NONE SEEN

## 2012-12-12 LAB — POCT PREGNANCY, URINE: Preg Test, Ur: NEGATIVE

## 2012-12-12 MED ORDER — OXYCODONE-ACETAMINOPHEN 5-325 MG PO TABS
1.0000 | ORAL_TABLET | Freq: Once | ORAL | Status: AC
  Administered 2012-12-12: 1 via ORAL
  Filled 2012-12-12: qty 1

## 2012-12-12 MED ORDER — OXYCODONE-ACETAMINOPHEN 5-325 MG PO TABS: 1.0000 | ORAL_TABLET | Freq: Three times a day (TID) | ORAL | Status: DC | PRN

## 2012-12-12 NOTE — ED Notes (Signed)
MD at bedside. 

## 2012-12-12 NOTE — ED Notes (Signed)
Low pelvic pain for 2 days, nausea, no vomiting, no diarrhea.  Last BM 5 days ago.  Last period 1/1  And was normal. And then had  Vaginal bleeding again, 1/27 lasting 2 days.

## 2012-12-12 NOTE — ED Provider Notes (Signed)
History  This chart was scribed for Isabel Gaskins, MD by Ardeen Jourdain, ED Scribe. This patient was seen in room [facility]17[facility] and the patient's care was started at 1302.  CSN: [id number]  Arrival date & time 12/12/12  1120   First MD Initiated Contact with Patient 12/12/12 1302      Chief Complaint  Patient presents with  . Pelvic Pain     Patient is a 22 y.o. female presenting with pelvic pain. The history is provided by the patient. No language interpreter was used.  Pelvic Pain This is a new problem. The current episode started more than 2 days ago. The problem occurs constantly. The problem has been gradually worsening. Associated symptoms include abdominal pain. Pertinent negatives include no chest pain, no headaches and no shortness of breath.   ABREY BRADWAY is a 22 y.o. female who presents to the Emergency Department complaining of lower pelvic pain with associated nausea. She states her last BM was 5 days ago and her last period was 1/1.  She states she had vaginal bleeding that began 1/27 and lasted 2 days. Pt denies fever, sore throat, CP, SOB, nausea, diarrhea, urinary symptoms, back pain, HA, weakness, numbness and rash as associated symptoms.  The pain was not sudden onset.  She was not exerting herself when the pain started   History reviewed. No pertinent past medical history.  Past Surgical History  Procedure Date  . Cholecystectomy     Family History  Problem Relation Age of Onset  . Arthritis    . Cancer    . Diabetes      History  Substance Use Topics  . Smoking status: Current Every Day Smoker -- 0.5 packs/day    Types: Cigarettes  . Smokeless tobacco: Not on file  . Alcohol Use: No   No OB history available.   Review of Systems  Constitutional: Negative for fever and chills.  Respiratory: Negative for shortness of breath.   Cardiovascular: Negative for chest pain.  Gastrointestinal: Positive for abdominal pain. Negative for nausea and  vomiting.  Genitourinary: Positive for pelvic pain. Negative for vaginal bleeding, vaginal discharge and vaginal pain.  Neurological: Negative for weakness and headaches.  All other systems reviewed and are negative.    Allergies  Amoxicillin and Penicillins  Home Medications   Current Outpatient Rx  Name  Route  Sig  Dispense  Refill  . ETONOGESTREL 68 MG [place] IMPL   Subcutaneous   Inject into the skin once. Due for replacement in 2014         . IBUPROFEN 200 MG PO TABS   Oral   Take 800 mg by mouth every 6 (six) hours as needed.           Triage Vitals:BP 123/79  Pulse 84  Temp 98.2 F (36.8 C) (Oral)  Resp 20  Ht 5\' 4"  (1.626 m)  Wt 180 lb (81.647 kg)  BMI 30.90 kg/m2  SpO2 100%  LMP 11/09/2012  Physical Exam  CONSTITUTIONAL: Well developed/well nourished HEAD AND FACE: Normocephalic/atraumatic EYES: EOMI/PERRL ENMT: Mucous membranes moist NECK: supple no meningeal signs SPINE:entire spine nontender CV: S1/S2 noted, no murmurs/rubs/gallops noted LUNGS: Lungs are clear to auscultation bilaterally, no apparent distress ABDOMEN: soft, nontender, no rebound or guarding GU:no cva tenderness Small amt of vag discharge.  No cmt.  No adnexal mass.  Mild bilateral adnexal tenderness.  Chaperone present NEURO: Pt is awake/alert, moves all extremitiesx4 EXTREMITIES: pulses normal, full ROM SKIN: warm, color normal PSYCH:  no abnormalities of mood noted   ED Course  Procedures   DIAGNOSTIC STUDIES: Oxygen Saturation is 100% on room air, normal by my interpretation.    COORDINATION OF CARE:  1:25 PM: Discussed treatment plan which includes UA, pregnancy and GC/Chlamydia with pt at bedside and pt agreed to plan.    Results for orders placed during the hospital encounter of 12/12/12  URINALYSIS, ROUTINE W REFLEX MICROSCOPIC      Component Value Range   Color, Urine YELLOW  YELLOW   APPearance CLEAR  CLEAR   Specific Gravity, Urine 1.020  1.005 - 1.030   pH  6.5  5.0 - 8.0   Glucose, UA NEGATIVE  NEGATIVE mg/dL   Hgb urine dipstick NEGATIVE  NEGATIVE   Bilirubin Urine NEGATIVE  NEGATIVE   Ketones, ur NEGATIVE  NEGATIVE mg/dL   Protein, ur NEGATIVE  NEGATIVE mg/dL   Urobilinogen, UA 0.2  0.0 - 1.0 mg/dL   Nitrite NEGATIVE  NEGATIVE   Leukocytes, UA NEGATIVE  NEGATIVE  POCT PREGNANCY, URINE      Component Value Range   Preg Test, Ur NEGATIVE  NEGATIVE    Pt well appearing no distress.  She reports monogamous sexual relationship and she has no concerns for STD Her abdominal exam is benign.  Given history and her exam, suspicion for TOA/torsion is low.  I doubt appendicitis.  I did offer pelvic ultrasound for her pelvic pain but she would like to be discharged and f/u as outpatient.  We discussed strict return precautions   MDM  Nursing notes including past medical history and social history reviewed and considered in documentation Labs/vital reviewed and considered       I personally performed the services described in this documentation, which was scribed in my presence. The recorded information has been reviewed and is accurate.      Isabel Gaskins, MD 12/12/12 [phone]

## 2012-12-13 LAB — GC/CHLAMYDIA PROBE AMP
CT Probe RNA: NEGATIVE
GC Probe RNA: NEGATIVE

## 2013-02-27 ENCOUNTER — Emergency Department (HOSPITAL_COMMUNITY)
Admission: EM | Admit: 2013-02-27 | Discharge: 2013-02-27 | Disposition: A | Discharge status: Home / Self Care | Payer: Medicaid Other | Attending: Emergency Medicine | Admitting: Emergency Medicine

## 2013-02-27 ENCOUNTER — Encounter (HOSPITAL_COMMUNITY): Payer: Self-pay | Admitting: *Deleted

## 2013-02-27 DIAGNOSIS — G43909 Migraine, unspecified, not intractable, without status migrainosus: Secondary | ICD-10-CM | POA: Insufficient documentation

## 2013-02-27 DIAGNOSIS — R112 Nausea with vomiting, unspecified: Secondary | ICD-10-CM | POA: Insufficient documentation

## 2013-02-27 DIAGNOSIS — F17200 Nicotine dependence, unspecified, uncomplicated: Secondary | ICD-10-CM | POA: Insufficient documentation

## 2013-02-27 DIAGNOSIS — R42 Dizziness and giddiness: Secondary | ICD-10-CM | POA: Insufficient documentation

## 2013-02-27 DIAGNOSIS — Z3202 Encounter for pregnancy test, result negative: Secondary | ICD-10-CM | POA: Insufficient documentation

## 2013-02-27 HISTORY — DX: Migraine, unspecified, not intractable, without status migrainosus: G43.909

## 2013-02-27 LAB — POCT I-STAT, CHEM 8
BUN: 12 mg/dL (ref 6–23)
Calcium, Ion: 1.18 mmol/L (ref 1.12–1.23)
Chloride: 104 mEq/L (ref 96–112)
Creatinine, Ser: 0.8 mg/dL (ref 0.50–1.10)
Glucose, Bld: 106 mg/dL — ABNORMAL HIGH (ref 70–99)
HCT: 45 % (ref 36.0–46.0)
Hemoglobin: 15.3 g/dL — ABNORMAL HIGH (ref 12.0–15.0)
Potassium: 4.3 mEq/L (ref 3.5–5.1)
Sodium: 138 mEq/L (ref 135–145)
TCO2: 27 mmol/L (ref 0–100)

## 2013-02-27 LAB — CBC WITH DIFFERENTIAL/PLATELET
Basophils Absolute: 0 10*3/uL (ref 0.0–0.1)
Basophils Relative: 0 % (ref 0–1)
Eosinophils Absolute: 0.4 10*3/uL (ref 0.0–0.7)
Eosinophils Relative: 4 % (ref 0–5)
HCT: 42.6 % (ref 36.0–46.0)
Hemoglobin: 15.3 g/dL — ABNORMAL HIGH (ref 12.0–15.0)
Lymphocytes Relative: 32 % (ref 12–46)
Lymphs Abs: 3.4 10*3/uL (ref 0.7–4.0)
MCH: 31 pg (ref 26.0–34.0)
MCHC: 35.9 g/dL (ref 30.0–36.0)
MCV: 86.2 fL (ref 78.0–100.0)
Monocytes Absolute: 0.7 10*3/uL (ref 0.1–1.0)
Monocytes Relative: 7 % (ref 3–12)
Neutro Abs: 6 10*3/uL (ref 1.7–7.7)
Neutrophils Relative %: 57 % (ref 43–77)
Platelets: 268 10*3/uL (ref 150–400)
RBC: 4.94 MIL/uL (ref 3.87–5.11)
RDW: 13.3 % (ref 11.5–15.5)
WBC: 10.5 10*3/uL (ref 4.0–10.5)

## 2013-02-27 LAB — URINALYSIS, ROUTINE W REFLEX MICROSCOPIC
Bilirubin Urine: NEGATIVE
Glucose, UA: NEGATIVE mg/dL
Hgb urine dipstick: NEGATIVE
Ketones, ur: NEGATIVE mg/dL
Leukocytes, UA: NEGATIVE
Nitrite: NEGATIVE
Protein, ur: NEGATIVE mg/dL
Specific Gravity, Urine: 1.016 (ref 1.005–1.030)
Urobilinogen, UA: 1 mg/dL (ref 0.0–1.0)
pH: 6.5 (ref 5.0–8.0)

## 2013-02-27 LAB — POCT PREGNANCY, URINE: Preg Test, Ur: NEGATIVE

## 2013-02-27 MED ORDER — METOCLOPRAMIDE HCL 5 MG/ML IJ SOLN
10.0000 mg | Freq: Once | INTRAMUSCULAR | Status: AC
  Administered 2013-02-27: 10 mg via INTRAMUSCULAR
  Filled 2013-02-27: qty 2

## 2013-02-27 MED ORDER — DEXAMETHASONE SODIUM PHOSPHATE 10 MG/ML IJ SOLN
10.0000 mg | Freq: Once | INTRAMUSCULAR | Status: AC
  Administered 2013-02-27: 10 mg via INTRAMUSCULAR
  Filled 2013-02-27: qty 1

## 2013-02-27 MED ORDER — ONDANSETRON 4 MG PO TBDP
8.0000 mg | ORAL_TABLET | Freq: Once | ORAL | Status: AC
  Administered 2013-02-27: 8 mg via ORAL
  Filled 2013-02-27: qty 2

## 2013-02-27 MED ORDER — KETOROLAC TROMETHAMINE 60 MG/2ML IM SOLN
60.0000 mg | Freq: Once | INTRAMUSCULAR | Status: AC
  Administered 2013-02-27: 60 mg via INTRAMUSCULAR
  Filled 2013-02-27: qty 2

## 2013-02-27 MED ORDER — DIPHENHYDRAMINE HCL 50 MG/ML IJ SOLN
25.0000 mg | Freq: Once | INTRAMUSCULAR | Status: AC
  Administered 2013-02-27: 25 mg via INTRAMUSCULAR
  Filled 2013-02-27: qty 1

## 2013-02-27 NOTE — ED Notes (Signed)
PT with hx of migraines to ED c/o headache and emesis x 3 days.  Pt is photophobic and states she feels as if the room is spinning.  Photophobic.

## 2013-02-27 NOTE — ED Notes (Signed)
EDP wants pt to sleep for an hour and recheck headache status prior to discharge.

## 2013-02-27 NOTE — ED Notes (Addendum)
Pt became dizzy, eyes rolled to back of head, and was difficult to arouse. Pt finally was fully alert within a minute and was alert and oriented x 4, neuro intact. EDP made aware.

## 2013-02-27 NOTE — ED Provider Notes (Signed)
History     CSN: [id number]  Arrival date & time 02/27/13  1733   First MD Initiated Contact with Patient 02/27/13 2122      Chief Complaint  Patient presents with  . Migraine  . Emesis    (Consider location/radiation/quality/duration/timing/severity/associated sxs/prior treatment) HPI Comments: Patient comes to the ER for evaluation of headache. Patient reports a history of migraine headaches. Patient reports that this headache is similar to previous migraines. Patient's pain is throbbing and has progressively worsened since its onset. It was not a sudden onset thunderclap type headache. Patient reports associated light sensitivity, nausea and vomiting. This is typical for her migraines. She took ibuprofen for the headache but it did not help. Patient is also complaining of some dizziness associated with the headache. At times for which he is spinning. There is no hearing loss, ear pain or ringing in the ears.  Patient is a 22 y.o. female presenting with migraines and vomiting.  Migraine Associated symptoms include headaches.  Emesis Associated symptoms: headaches     Past Medical History  Diagnosis Date  . Migraines     Past Surgical History  Procedure Laterality Date  . Cholecystectomy      Family History  Problem Relation Age of Onset  . Arthritis    . Cancer    . Diabetes      History  Substance Use Topics  . Smoking status: Current Every Day Smoker -- 0.50 packs/day    Types: Cigarettes  . Smokeless tobacco: Not on file  . Alcohol Use: No    OB History   Grav Para Term Preterm Abortions TAB SAB Ect Mult Living                  Review of Systems  Constitutional: Negative for fever.  Gastrointestinal: Positive for vomiting.  Neurological: Positive for dizziness and headaches.    Allergies  Amoxicillin and Penicillins  Home Medications   Current Outpatient Rx  Name  Route  Sig  Dispense  Refill  . ibuprofen (ADVIL,MOTRIN) 200 MG tablet   Oral   Take 800 mg by mouth every 6 (six) hours as needed.           BP 106/72  Pulse 86  Temp(Src) 97.9 F (36.6 C) (Oral)  SpO2 98%  LMP 02/07/2013  Physical Exam  Constitutional: She is oriented to person, place, and time. She appears well-developed and well-nourished. No distress.  HENT:  Head: Normocephalic and atraumatic.  Right Ear: Hearing normal.  Nose: Nose normal.  Mouth/Throat: Oropharynx is clear and moist and mucous membranes are normal.  Eyes: Conjunctivae and EOM are normal. Pupils are equal, round, and reactive to light.  Neck: Normal range of motion. Neck supple. No Brudzinski's sign and no Kernig's sign noted.  Cardiovascular: Normal rate, regular rhythm, S1 normal and S2 normal.  Exam reveals no gallop and no friction rub.   No murmur heard. Pulmonary/Chest: Effort normal and breath sounds normal. No respiratory distress. She exhibits no tenderness.  Abdominal: Soft. Normal appearance and bowel sounds are normal. There is no hepatosplenomegaly. There is no tenderness. There is no rebound, no guarding, no tenderness at McBurney's point and negative Murphy's sign. No hernia.  Musculoskeletal: Normal range of motion.  Neurological: She is alert and oriented to person, place, and time. She has normal strength. No cranial nerve deficit or sensory deficit. Coordination normal. GCS eye subscore is 4. GCS verbal subscore is 5. GCS motor subscore is 6.  Reflex Scores:  Tricep reflexes are 2+ on the right side and 2+ on the left side.      Bicep reflexes are 2+ on the right side and 2+ on the left side.      Brachioradialis reflexes are 2+ on the right side and 2+ on the left side. Skin: Skin is warm, dry and intact. No rash noted. No cyanosis.  Psychiatric: She has a normal mood and affect. Her speech is normal and behavior is normal. Thought content normal.    ED Course  Procedures (including critical care time)  Labs Reviewed  CBC WITH DIFFERENTIAL - Abnormal;  Notable for the following:    Hemoglobin 15.3 (*)    All other components within normal limits  POCT I-STAT, CHEM 8 - Abnormal; Notable for the following:    Glucose, Bld 106 (*)    Hemoglobin 15.3 (*)    All other components within normal limits  URINALYSIS, ROUTINE W REFLEX MICROSCOPIC  POCT PREGNANCY, URINE   No results found.   Diagnosis: Migraine headache    MDM  Patient comes to the ER for evaluation of headache with photosensitivity, nausea, vomiting. Is typical for the patient's previous migraine headaches. Her neurologic examination was entirely unremarkable. As her headache is similar to previous migraines with a normal neurologic exam, there is no imaging or workup necessary. She did report some feelings of dizziness and spinning, but this is nonspecific. She is not currently feeling dispense. Patient treated as a migraine headache and discharged.        Gilda Crease, MD 02/27/13 [phone]

## 2013-02-27 NOTE — ED Notes (Signed)
Pt reports headache x 3 days. States she has a history of migraines and usually takes Ibuprofen, but this time it has not helped her headache. Pt reports nausea, vomiting, and dizziness with the headache. Pt alert and oriented x 4, neuro intact. Ambulated to the bathroom unassisted without dificulty.

## 2013-02-27 NOTE — ED Notes (Signed)
Called pt for triage. No response.  

## 2013-06-10 ENCOUNTER — Emergency Department: Payer: Self-pay | Admitting: Emergency Medicine

## 2013-07-06 ENCOUNTER — Encounter (HOSPITAL_COMMUNITY): Payer: Self-pay | Admitting: Emergency Medicine

## 2013-07-06 ENCOUNTER — Emergency Department (HOSPITAL_COMMUNITY): Payer: Medicaid Other

## 2013-07-06 ENCOUNTER — Emergency Department (HOSPITAL_COMMUNITY)
Admission: EM | Admit: 2013-07-06 | Discharge: 2013-07-06 | Disposition: A | Discharge status: Home / Self Care | Payer: Medicaid Other | Attending: Emergency Medicine | Admitting: Emergency Medicine

## 2013-07-06 DIAGNOSIS — S8000XA Contusion of unspecified knee, initial encounter: Secondary | ICD-10-CM | POA: Insufficient documentation

## 2013-07-06 DIAGNOSIS — W108XXA Fall (on) (from) other stairs and steps, initial encounter: Secondary | ICD-10-CM | POA: Insufficient documentation

## 2013-07-06 DIAGNOSIS — Y929 Unspecified place or not applicable: Secondary | ICD-10-CM | POA: Insufficient documentation

## 2013-07-06 DIAGNOSIS — F17200 Nicotine dependence, unspecified, uncomplicated: Secondary | ICD-10-CM | POA: Insufficient documentation

## 2013-07-06 DIAGNOSIS — Z8679 Personal history of other diseases of the circulatory system: Secondary | ICD-10-CM | POA: Insufficient documentation

## 2013-07-06 DIAGNOSIS — Z880 Allergy status to penicillin: Secondary | ICD-10-CM | POA: Insufficient documentation

## 2013-07-06 DIAGNOSIS — S8001XA Contusion of right knee, initial encounter: Secondary | ICD-10-CM

## 2013-07-06 DIAGNOSIS — Y9389 Activity, other specified: Secondary | ICD-10-CM | POA: Insufficient documentation

## 2013-07-06 DIAGNOSIS — W010XXA Fall on same level from slipping, tripping and stumbling without subsequent striking against object, initial encounter: Secondary | ICD-10-CM | POA: Insufficient documentation

## 2013-07-06 IMAGING — CR DG KNEE COMPLETE 4+V*R*
4 series · 4 of 4 positions shown · non-contrast
Comparison: No priors.

CLINICAL DATA: History of fall complaining of knee pain.

RIGHT KNEE - COMPLETE 4+ VIEW

[view not recorded (1 of 4)]
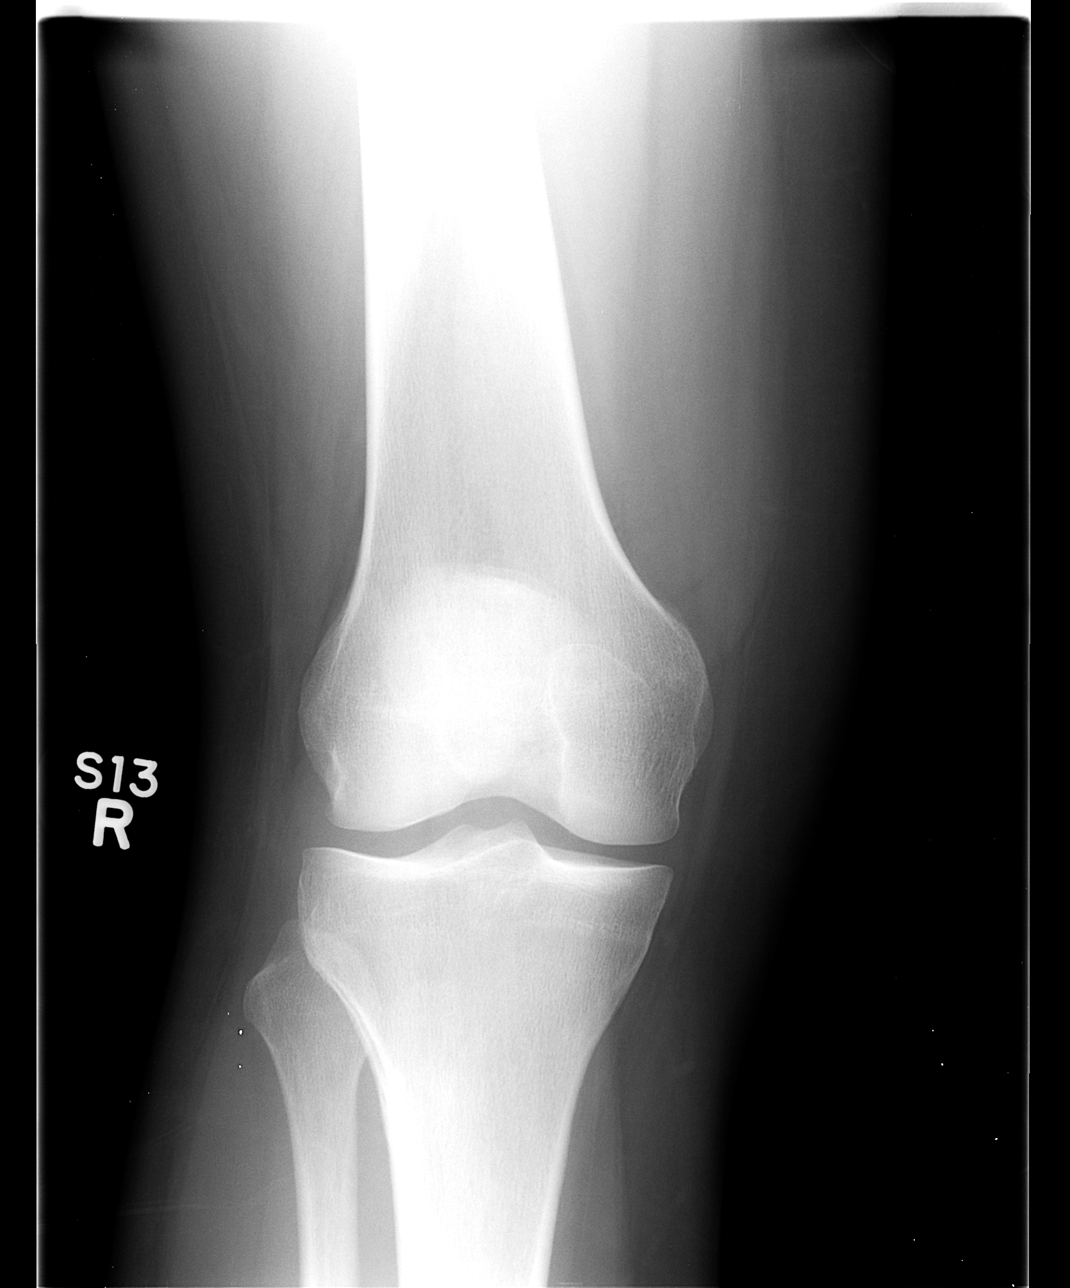

[view not recorded (2 of 4)]
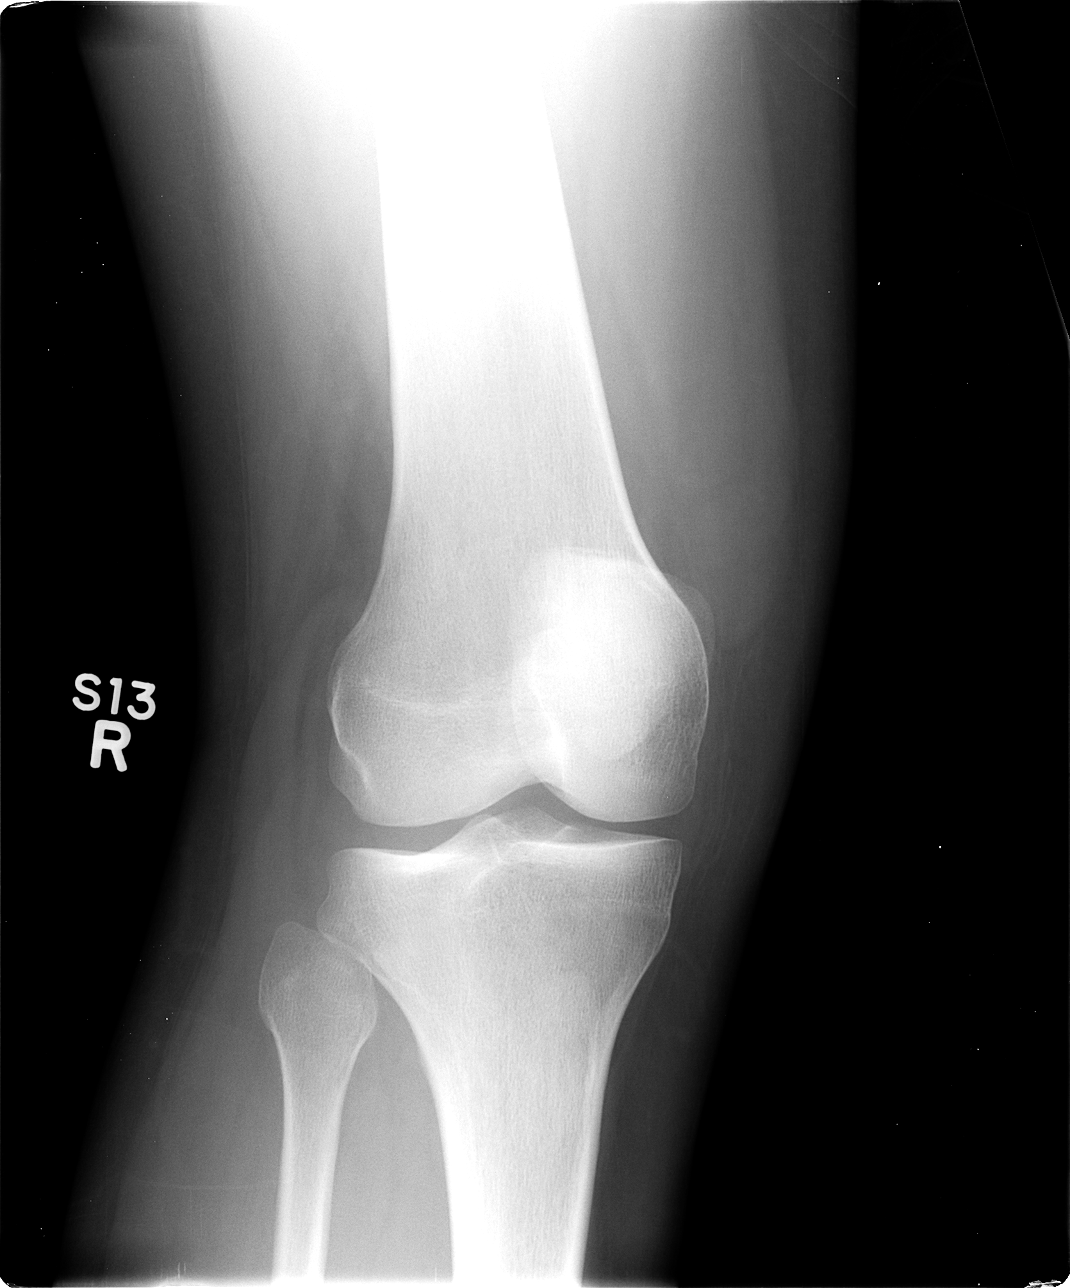

[view not recorded (3 of 4)]
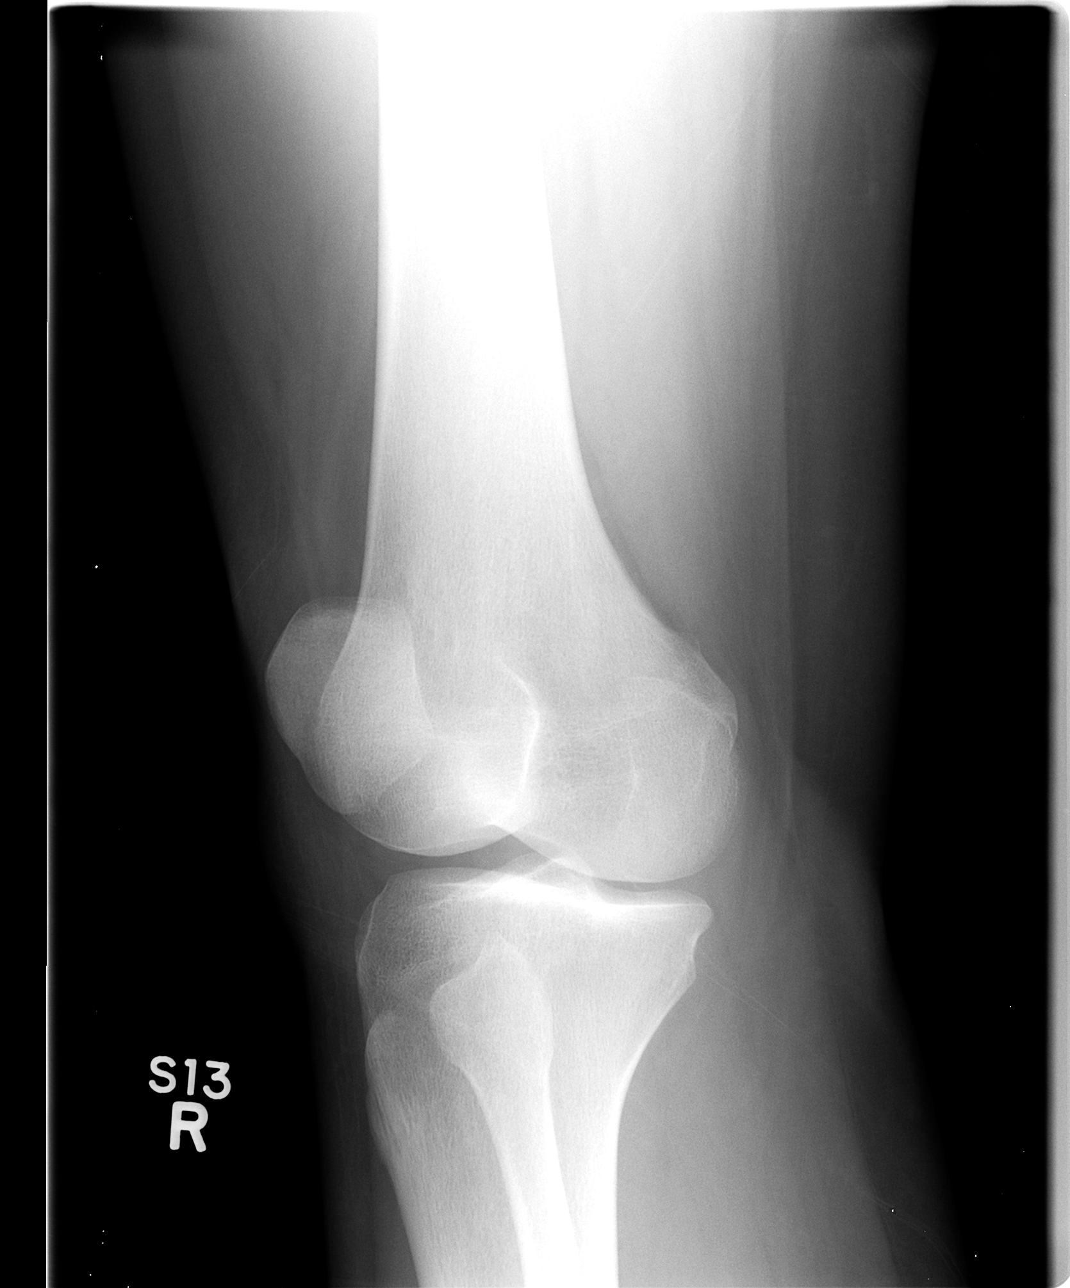

[view not recorded (4 of 4)]
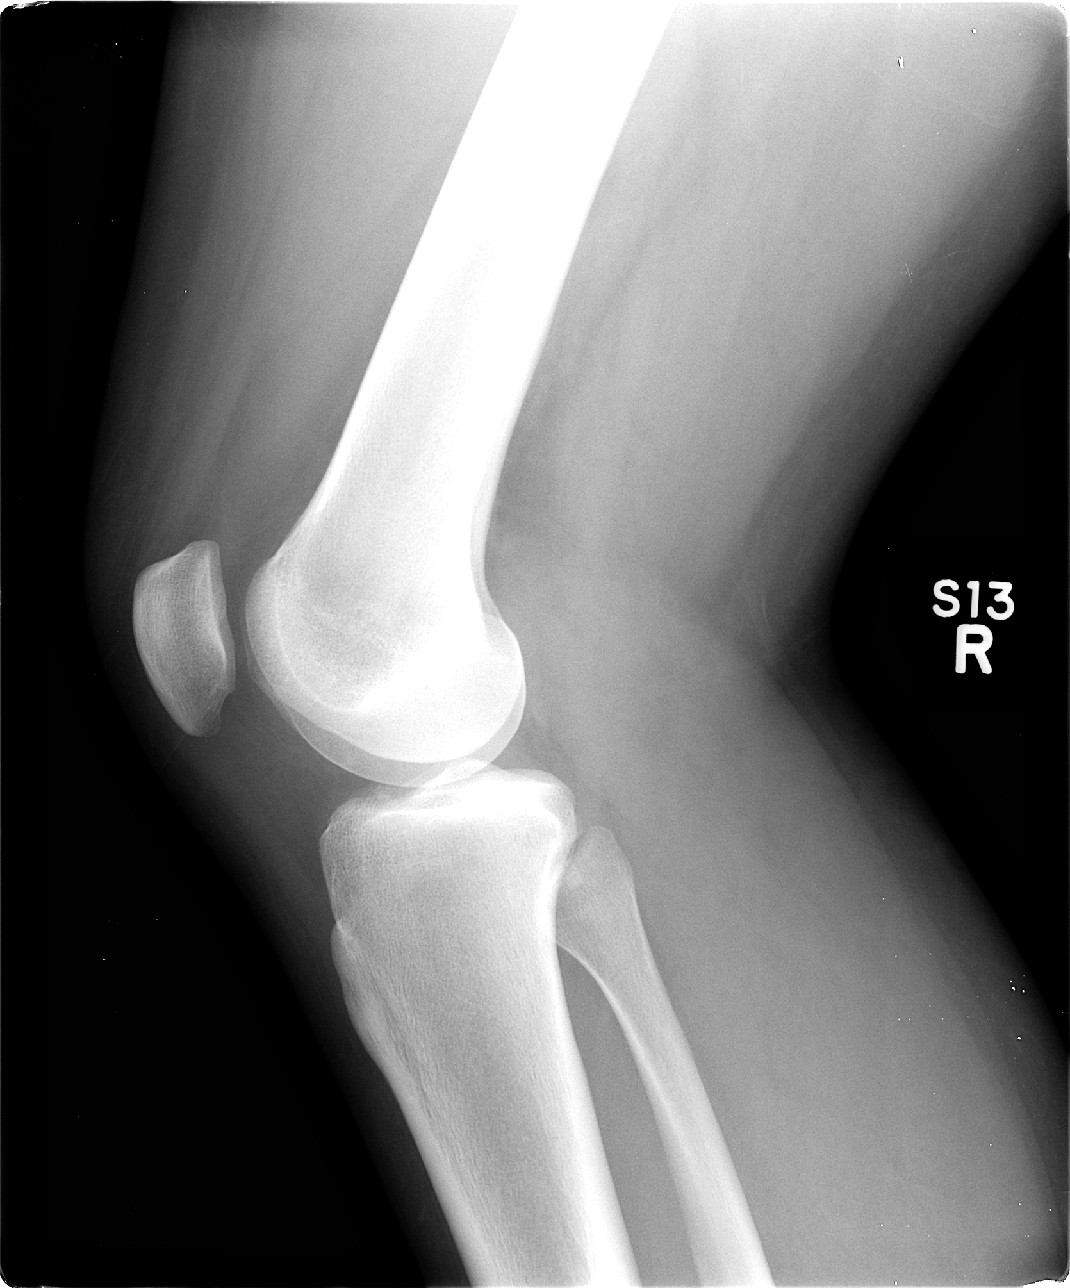

[4 of 4 positions shown; findings below may reference images not displayed]

FINDINGS: Four views of the right knee demonstrate no acute
displaced fracture, subluxation, dislocation, joint or soft tissue
abnormality.
IMPRESSION: 1.  No acute radiographic abnormality of the right knee.

## 2013-07-06 MED ORDER — HYDROCODONE-ACETAMINOPHEN 5-325 MG PO TABS: 1.0000 | ORAL_TABLET | ORAL | Status: DC | PRN

## 2013-07-06 NOTE — ED Notes (Signed)
Pt fell down approx 8 steps. Only c/o is right knee pain. Denies hitting head or loc. Nad. Slight limp noted.

## 2013-07-06 NOTE — ED Provider Notes (Signed)
CSN: [id number]     Arrival date & time 07/06/13  1456 History   First MD Initiated Contact with Patient 07/06/13 1526     Chief Complaint  Patient presents with  . Fall   (Consider location/radiation/quality/duration/timing/severity/associated sxs/prior Treatment) Patient is a 22 y.o. female presenting with knee pain. The history is provided by the patient.  Knee Pain Location:  Knee Time since incident:  1 day Injury: yes   Mechanism of injury: fall   Fall:    Fall occurred:  Down stairs   Impact surface:  Psychiatric nurse of impact:  Knees Knee location:  R knee Pain details:    Quality:  Sharp and tingling   Radiates to:  Does not radiate   Severity:  Severe   Onset quality:  Sudden   Duration:  1 day   Timing:  Constant   Progression:  Worsening Chronicity:  New Dislocation: no   Foreign body present:  No foreign bodies Prior injury to area:  No Relieved by:  Nothing Worsened by:  Bearing weight and activity Ineffective treatments:  Ice Associated symptoms: no back pain, no fever and no neck pain    Isabel [name] is a 22 y.o. female who presents to the ED with right knee pain after falling down 8 steps. She denies hitting her head or LOC. Patient states that her 22 year old left toys on the steps and she tripped on them.   Past Medical History  Diagnosis Date  . Migraines    Past Surgical History  Procedure Laterality Date  . Cholecystectomy     Family History  Problem Relation Age of Onset  . Arthritis    . Cancer    . Diabetes     History  Substance Use Topics  . Smoking status: Current Every Day Smoker -- 0.50 packs/day    Types: Cigarettes  . Smokeless tobacco: Not on file  . Alcohol Use: No   OB History   Grav Para Term Preterm Abortions TAB SAB Ect Mult Living                 Review of Systems  Constitutional: Negative for fever.  HENT: Negative for neck pain.   Eyes: Negative for visual disturbance.  Respiratory: Negative for shortness  of breath.   Gastrointestinal: Negative for nausea and vomiting.  Musculoskeletal: Negative for back pain.       Right knee pain  Skin: Negative for wound.  Neurological: Negative for headaches.  Psychiatric/Behavioral: The patient is not nervous/anxious.     Allergies  Amoxicillin and Penicillins  Home Medications   Current Outpatient Rx  Name  Route  Sig  Dispense  Refill  . ibuprofen (ADVIL,MOTRIN) 200 MG tablet   Oral   Take 800 mg by mouth every 6 (six) hours as needed.          BP 95/64  Pulse 90  Temp(Src) 98.4 F (36.9 C) (Oral)  Resp 18  SpO2 100%  LMP 06/08/2013 Physical Exam  Nursing note and vitals reviewed. Constitutional: She is oriented to person, place, and time. She appears well-developed and well-nourished. No distress.  HENT:  Head: Normocephalic.  Eyes: EOM are normal.  Neck: Neck supple.  Cardiovascular: Normal rate.   Pulmonary/Chest: Effort normal.  Musculoskeletal:       Right knee: She exhibits swelling. She exhibits no erythema, normal alignment and no LCL laxity. Decreased range of motion: due to pain.       Legs:  Pedal pulses equal, adequate circulation, good touch sensation.   Neurological: She is alert and oriented to person, place, and time. She has normal strength. No cranial nerve deficit or sensory deficit.  Pain with ambulation  Skin: Skin is warm and dry.  Psychiatric: She has a normal mood and affect. Her behavior is normal.    ED Course  Procedures Dg Knee Complete 4 Views Right  07/06/2013   *RADIOLOGY REPORT*  Clinical Data: History of fall complaining of knee pain.  RIGHT KNEE - COMPLETE 4+ VIEW  Comparison: No priors.  Findings: Four views of the right knee demonstrate no acute displaced fracture, subluxation, dislocation, joint or soft tissue abnormality.  IMPRESSION: 1.  No acute radiographic abnormality of the right knee.   Original Report Authenticated By: Trudie Reed, M.D.    MDM  22 y.o. female with contusion  to the right knee. Will apply knee immobilizer and patient will follow up with ortho if pain persist. Will treat for pain and inflammation.  Patient stable for discharge home without any immediate complications. No concern for compartment syndrome at this time.  I have reviewed this patient's vital signs, nurses notes, appropriate labs and imaging.  I have discussed findings and plan of care with the patient and she voces understanding.    Medication List    TAKE these medications       HYDROcodone-acetaminophen 5-325 MG per tablet  Commonly known as:  NORCO/VICODIN  Take 1 tablet by mouth every 4 (four) hours as needed.      ASK your doctor about these medications       ibuprofen 200 MG tablet  Commonly known as:  ADVIL,MOTRIN  Take 800 mg by mouth every 6 (six) hours as needed for pain.            [facility] Orlene Och, [place] 07/07/13 1323

## 2013-07-06 NOTE — ED Notes (Signed)
Pt c/o right knee pain since last night. Pt states she tripped and fell down 8 steps. Pt states all impact of fall was on right knee.

## 2013-07-07 NOTE — ED Provider Notes (Signed)
Medical screening examination/treatment/procedure(s) were performed by non-physician practitioner and as supervising physician I was immediately available for consultation/collaboration.   William Blair Walden, MD 07/07/13 2049 

## 2013-07-26 ENCOUNTER — Encounter (HOSPITAL_COMMUNITY): Payer: Self-pay

## 2013-07-26 ENCOUNTER — Emergency Department (HOSPITAL_COMMUNITY)
Admission: EM | Admit: 2013-07-26 | Discharge: 2013-07-26 | Disposition: A | Discharge status: Home / Self Care | Payer: Medicaid Other | Attending: Emergency Medicine | Admitting: Emergency Medicine

## 2013-07-26 DIAGNOSIS — F17200 Nicotine dependence, unspecified, uncomplicated: Secondary | ICD-10-CM | POA: Insufficient documentation

## 2013-07-26 DIAGNOSIS — Z8679 Personal history of other diseases of the circulatory system: Secondary | ICD-10-CM | POA: Insufficient documentation

## 2013-07-26 DIAGNOSIS — M436 Torticollis: Secondary | ICD-10-CM | POA: Insufficient documentation

## 2013-07-26 DIAGNOSIS — Z880 Allergy status to penicillin: Secondary | ICD-10-CM | POA: Insufficient documentation

## 2013-07-26 MED ORDER — CYCLOBENZAPRINE HCL 10 MG PO TABS: 10.0000 mg | ORAL_TABLET | Freq: Two times a day (BID) | ORAL | Status: DC | PRN

## 2013-07-26 MED ORDER — MELOXICAM 7.5 MG PO TABS: 7.5000 mg | ORAL_TABLET | Freq: Every day | ORAL | Status: DC

## 2013-07-26 NOTE — ED Notes (Signed)
Pt reports chronic neck issues, has been having pain for 2 days has been told that she has "neck spasms"

## 2013-07-26 NOTE — ED Provider Notes (Signed)
CSN: [id number]     Arrival date & time 07/26/13  1212 History   First MD Initiated Contact with Patient 07/26/13 1223     Chief Complaint  Patient presents with  . Torticollis   (Consider location/radiation/quality/duration/timing/severity/associated sxs/prior Treatment) The history is provided by the patient.   Isabel [name] is a 22 y.o. female who presents to the ED with neck pain. She states that she has had neck issues in the past with spasm. This pain started 2 days ago. Feels like neck is in spasm. Past Medical History  Diagnosis Date  . Migraines    Past Surgical History  Procedure Laterality Date  . Cholecystectomy     Family History  Problem Relation Age of Onset  . Arthritis    . Cancer    . Diabetes     History  Substance Use Topics  . Smoking status: Current Every Day Smoker -- 0.50 packs/day    Types: Cigarettes  . Smokeless tobacco: Not on file  . Alcohol Use: No   OB History   Grav Para Term Preterm Abortions TAB SAB Ect Mult Living                 Review of Systems  Constitutional: Negative for fever and chills.  HENT: Positive for neck pain (right side). Negative for congestion and facial swelling.   Eyes: Negative for pain and visual disturbance.  Respiratory: Negative for chest tightness and shortness of breath.   Cardiovascular: Negative for chest pain.  Gastrointestinal: Negative for nausea and vomiting.  Genitourinary: Negative for urgency and frequency.  Musculoskeletal: Negative for back pain.  Skin: Negative for wound.  Neurological: Negative for dizziness and headaches.  Psychiatric/Behavioral: The patient is not nervous/anxious.     Allergies  Amoxicillin and Penicillins  Home Medications  No current outpatient prescriptions on file. BP 118/83  Pulse 76  Temp(Src) 98.2 F (36.8 C) (Oral)  Resp 20  Ht 5\' 4"  (1.626 m)  Wt 170 lb (77.111 kg)  BMI 29.17 kg/m2  SpO2 100%  LMP 07/19/2013 Physical Exam  Nursing note and  vitals reviewed. Constitutional: She is oriented to person, place, and time. She appears well-developed and well-nourished. No distress.  HENT:  Head: Normocephalic and atraumatic.  Eyes: Conjunctivae and EOM are normal. Pupils are equal, round, and reactive to light.  Neck: Neck supple. Muscular tenderness present. No spinous process tenderness present.    Muscle spasm noted right side of neck. Pain right side of neck with range of motion.  Cardiovascular: Normal rate.   Pulmonary/Chest: Effort normal.  Musculoskeletal: Normal range of motion.  Neurological: She is alert and oriented to person, place, and time. No cranial nerve deficit.  Skin: Skin is warm and dry.  Psychiatric: She has a normal mood and affect. Her behavior is normal.    ED Course  Procedures  MDM  22 y.o. female with muscle spasm right side of neck. Will treat with muscle relaxant and NSAIDS.  Discussed with the patient clinical findings and plan of care. All questioned fully answered. She will return if any problems arise.    Medication List         cyclobenzaprine 10 MG tablet  Commonly known as:  FLEXERIL  Take 1 tablet (10 mg total) by mouth 2 (two) times daily as needed for muscle spasms.     meloxicam 7.5 MG tablet  Commonly known as:  MOBIC  Take 1 tablet (7.5 mg total) by mouth daily.  [facility] Orlene Och, NP 07/26/13 1425

## 2013-07-27 NOTE — ED Provider Notes (Signed)
Medical screening examination/treatment/procedure(s) were performed by non-physician practitioner and as supervising physician I was immediately available for consultation/collaboration.   Candyce Churn, MD 07/27/13 [phone]

## 2013-12-28 ENCOUNTER — Emergency Department: Payer: Self-pay | Admitting: Internal Medicine

## 2014-07-18 ENCOUNTER — Emergency Department: Payer: Self-pay | Admitting: Emergency Medicine

## 2014-07-18 LAB — MONONUCLEOSIS SCREEN: Mono Test: NEGATIVE

## 2014-07-18 LAB — BASIC METABOLIC PANEL
Anion Gap: 5 — ABNORMAL LOW (ref 7–16)
BUN: 10 mg/dL (ref 7–18)
Calcium, Total: 8.6 mg/dL (ref 8.5–10.1)
Chloride: 110 mmol/L — ABNORMAL HIGH (ref 98–107)
Co2: 23 mmol/L (ref 21–32)
Creatinine: 0.69 mg/dL (ref 0.60–1.30)
EGFR (African American): >60
EGFR (Non-African Amer.): >60
Glucose: 106 mg/dL — ABNORMAL HIGH (ref 65–99)
Osmolality: 275 (ref 275–301)
Potassium: 3.8 mmol/L (ref 3.5–5.1)
Sodium: 138 mmol/L (ref 136–145)

## 2014-07-18 IMAGING — CR DG CHEST 2V
1 series · 2 of 2 positions shown · non-contrast
Comparison: [DATE]

CLINICAL DATA: Cough and shortness of breath

EXAM:
CHEST  2 VIEW

[Series 1: dxr chest pa (or ap) and lateral · 0.14mm/px · 2 of 2 slices shown]
[im 1/2]
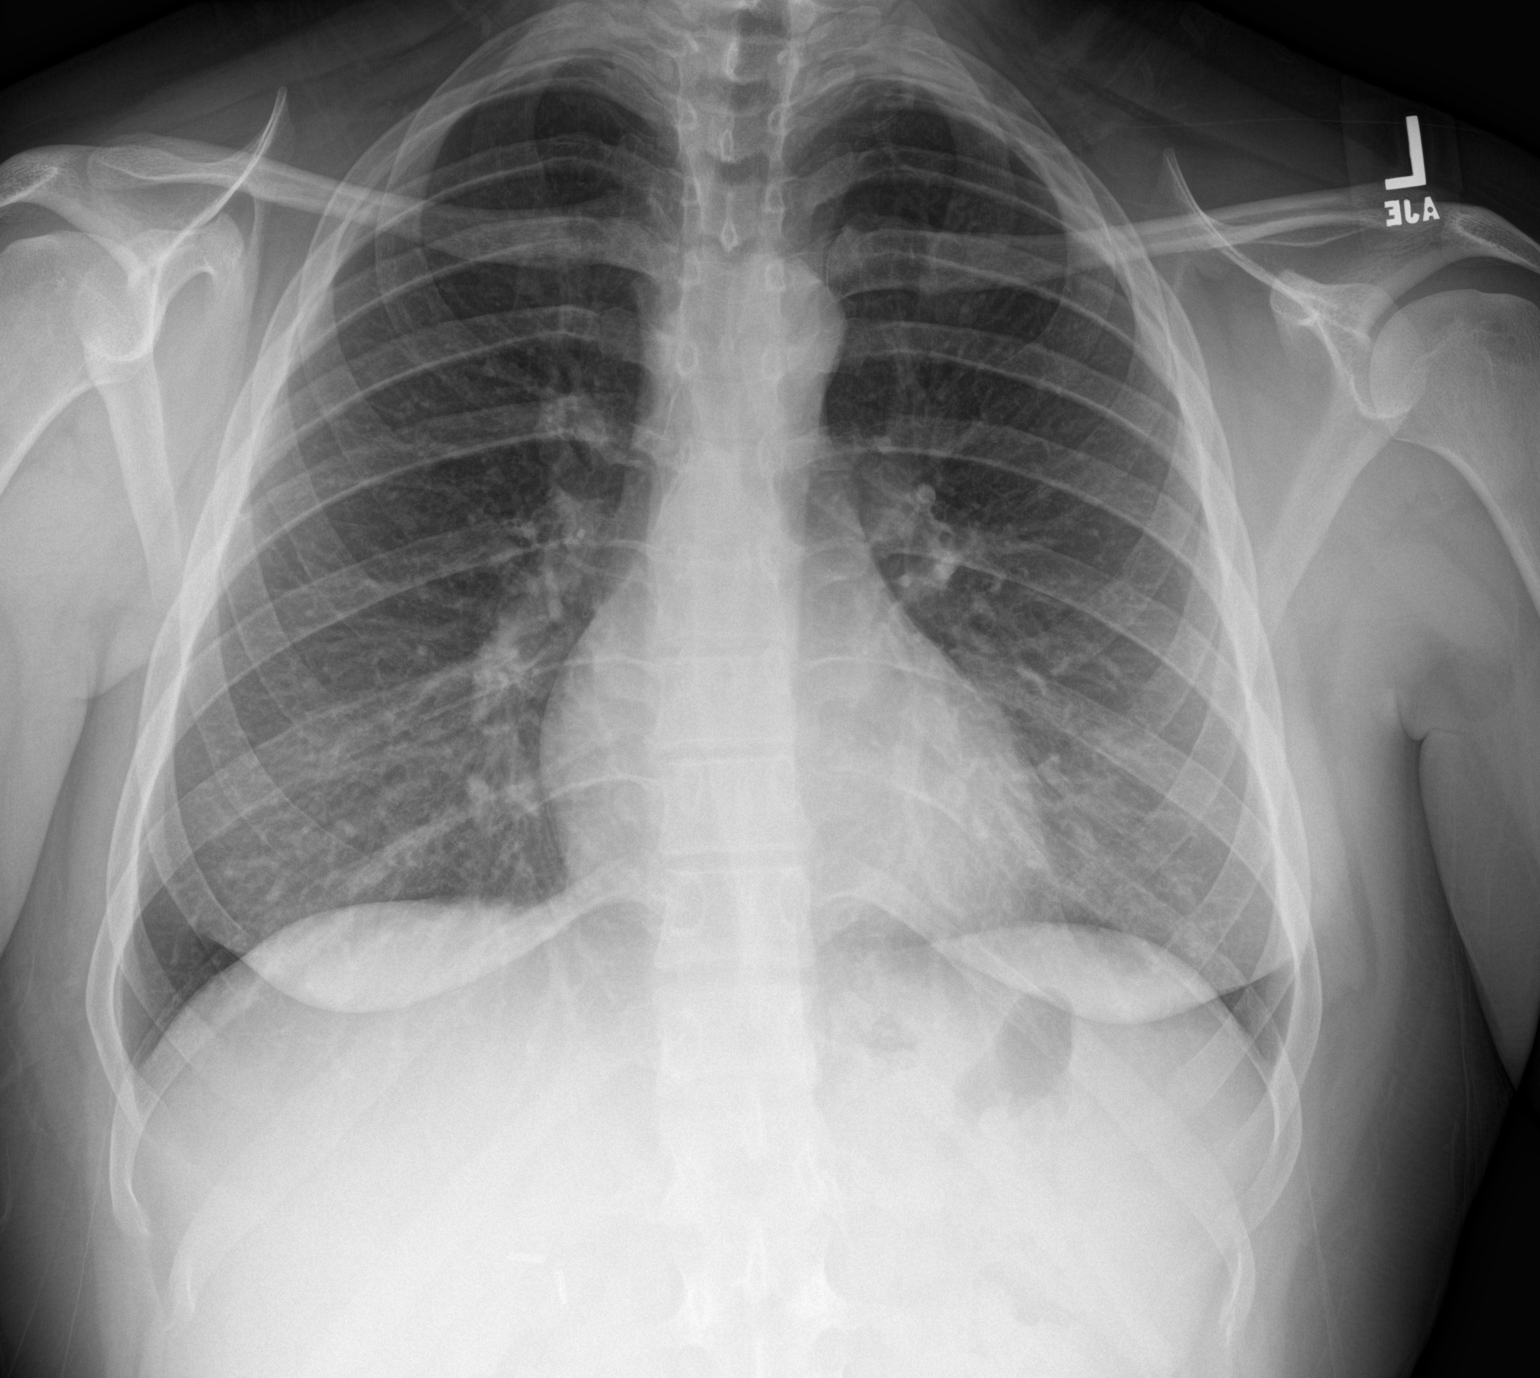
[im 2/2]
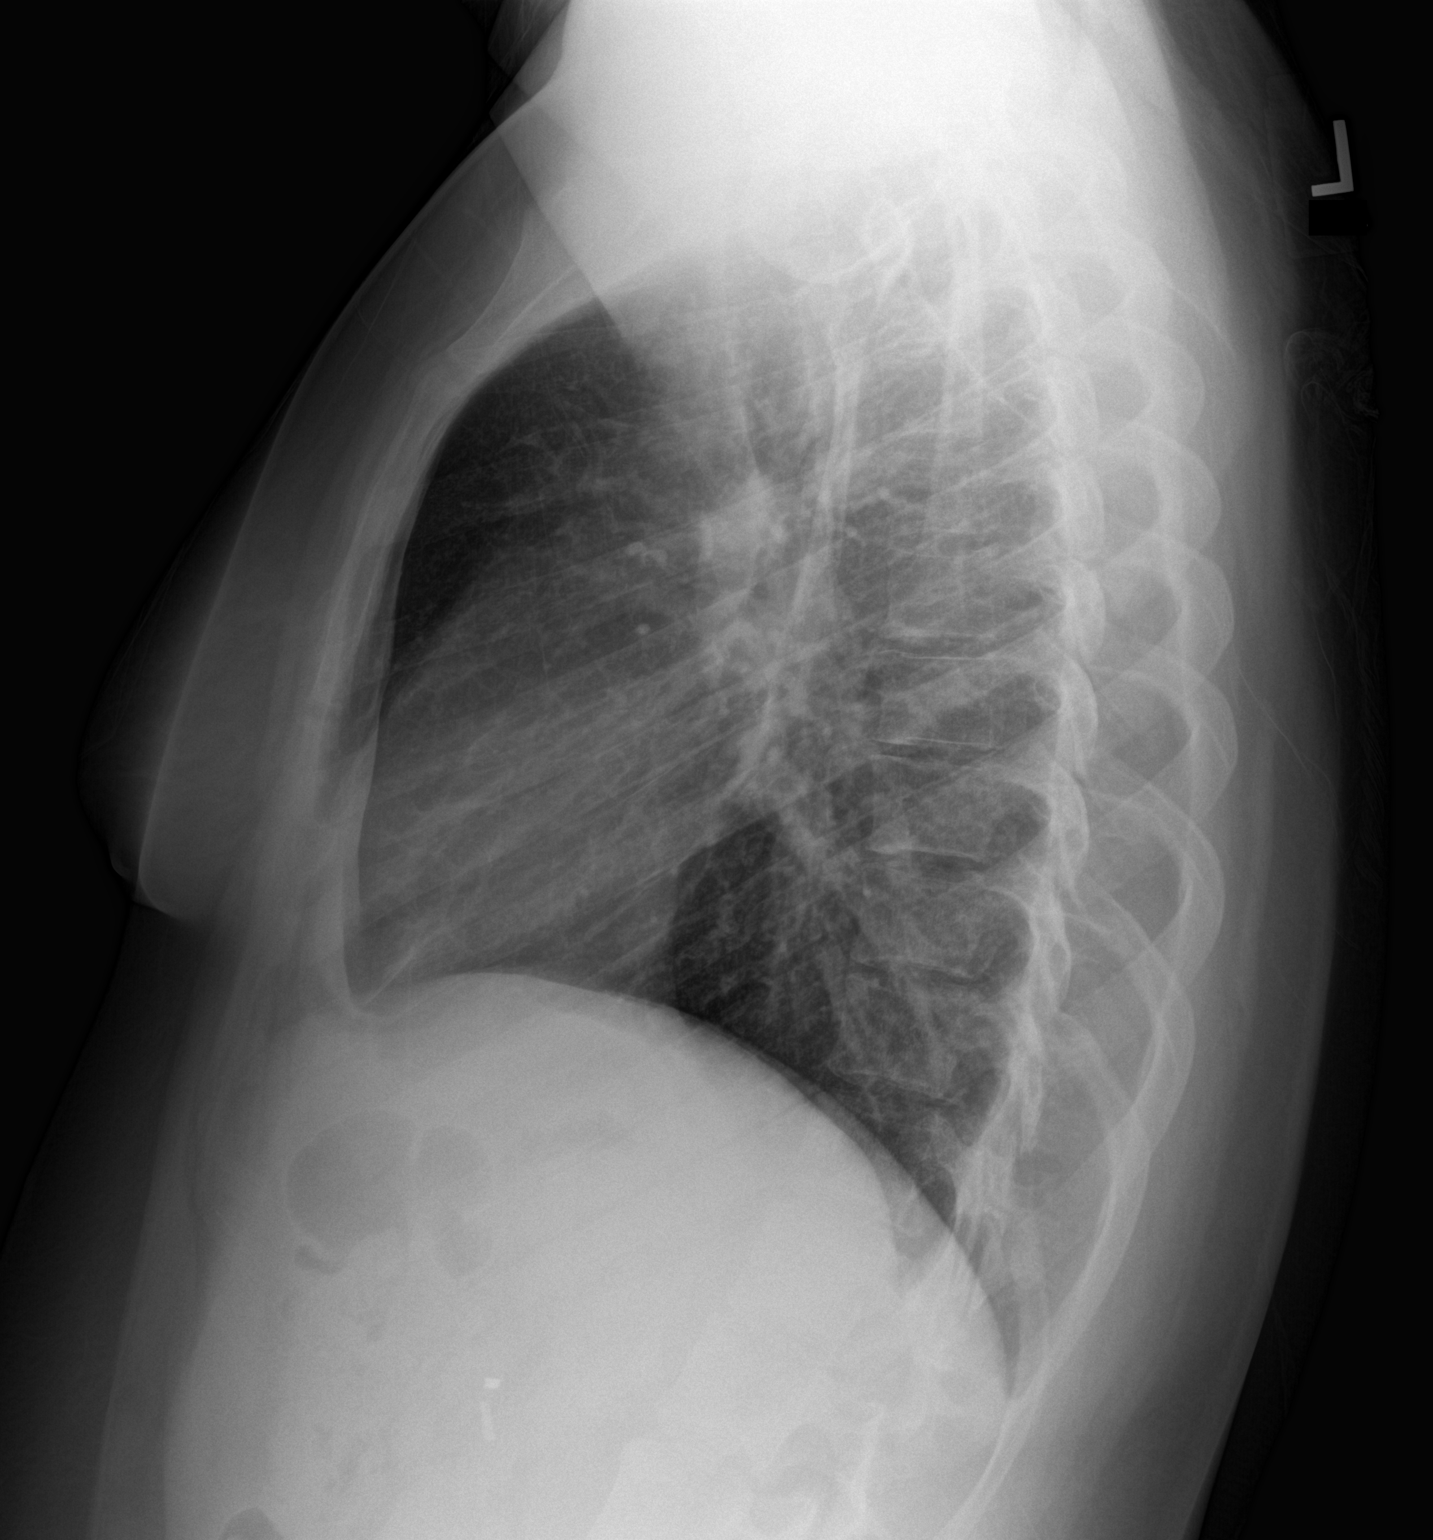

[2 of 2 positions shown; findings below may reference images not displayed]

FINDINGS: The heart size and mediastinal contours are within normal limits.
Both lungs are clear. The visualized skeletal structures are
unremarkable.
IMPRESSION: No active cardiopulmonary disease.

## 2014-07-23 ENCOUNTER — Emergency Department: Payer: Self-pay | Admitting: Emergency Medicine

## 2014-07-23 IMAGING — CR DG CHEST 2V
1 series · 2 of 2 positions shown · non-contrast
Comparison: PA and lateral chest [DATE]

CLINICAL DATA: Cough and wheezing with history of tobacco use

EXAM:
CHEST  2 VIEW

[Series 1: w chest pa · 0.14mm/px · 2 of 2 slices shown]
[im 1/2]
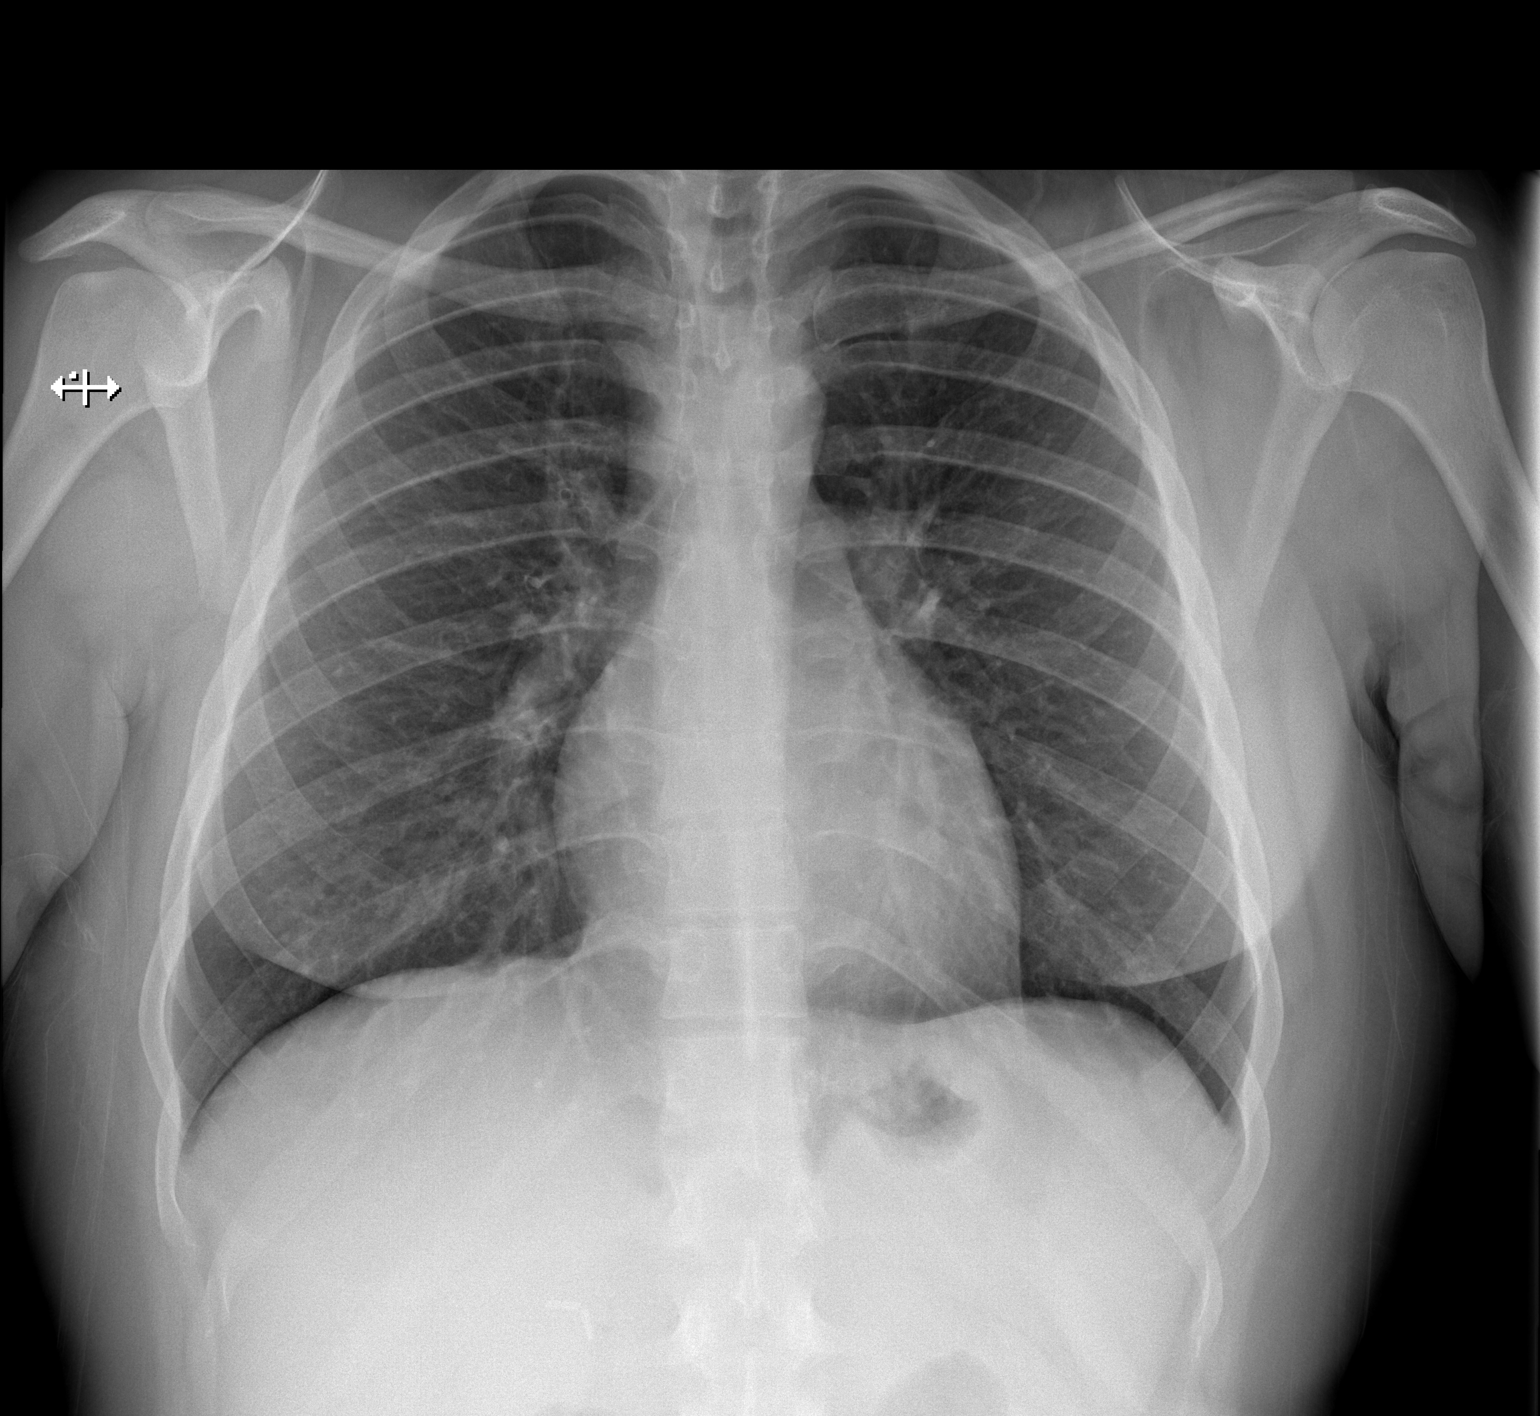
[im 2/2]
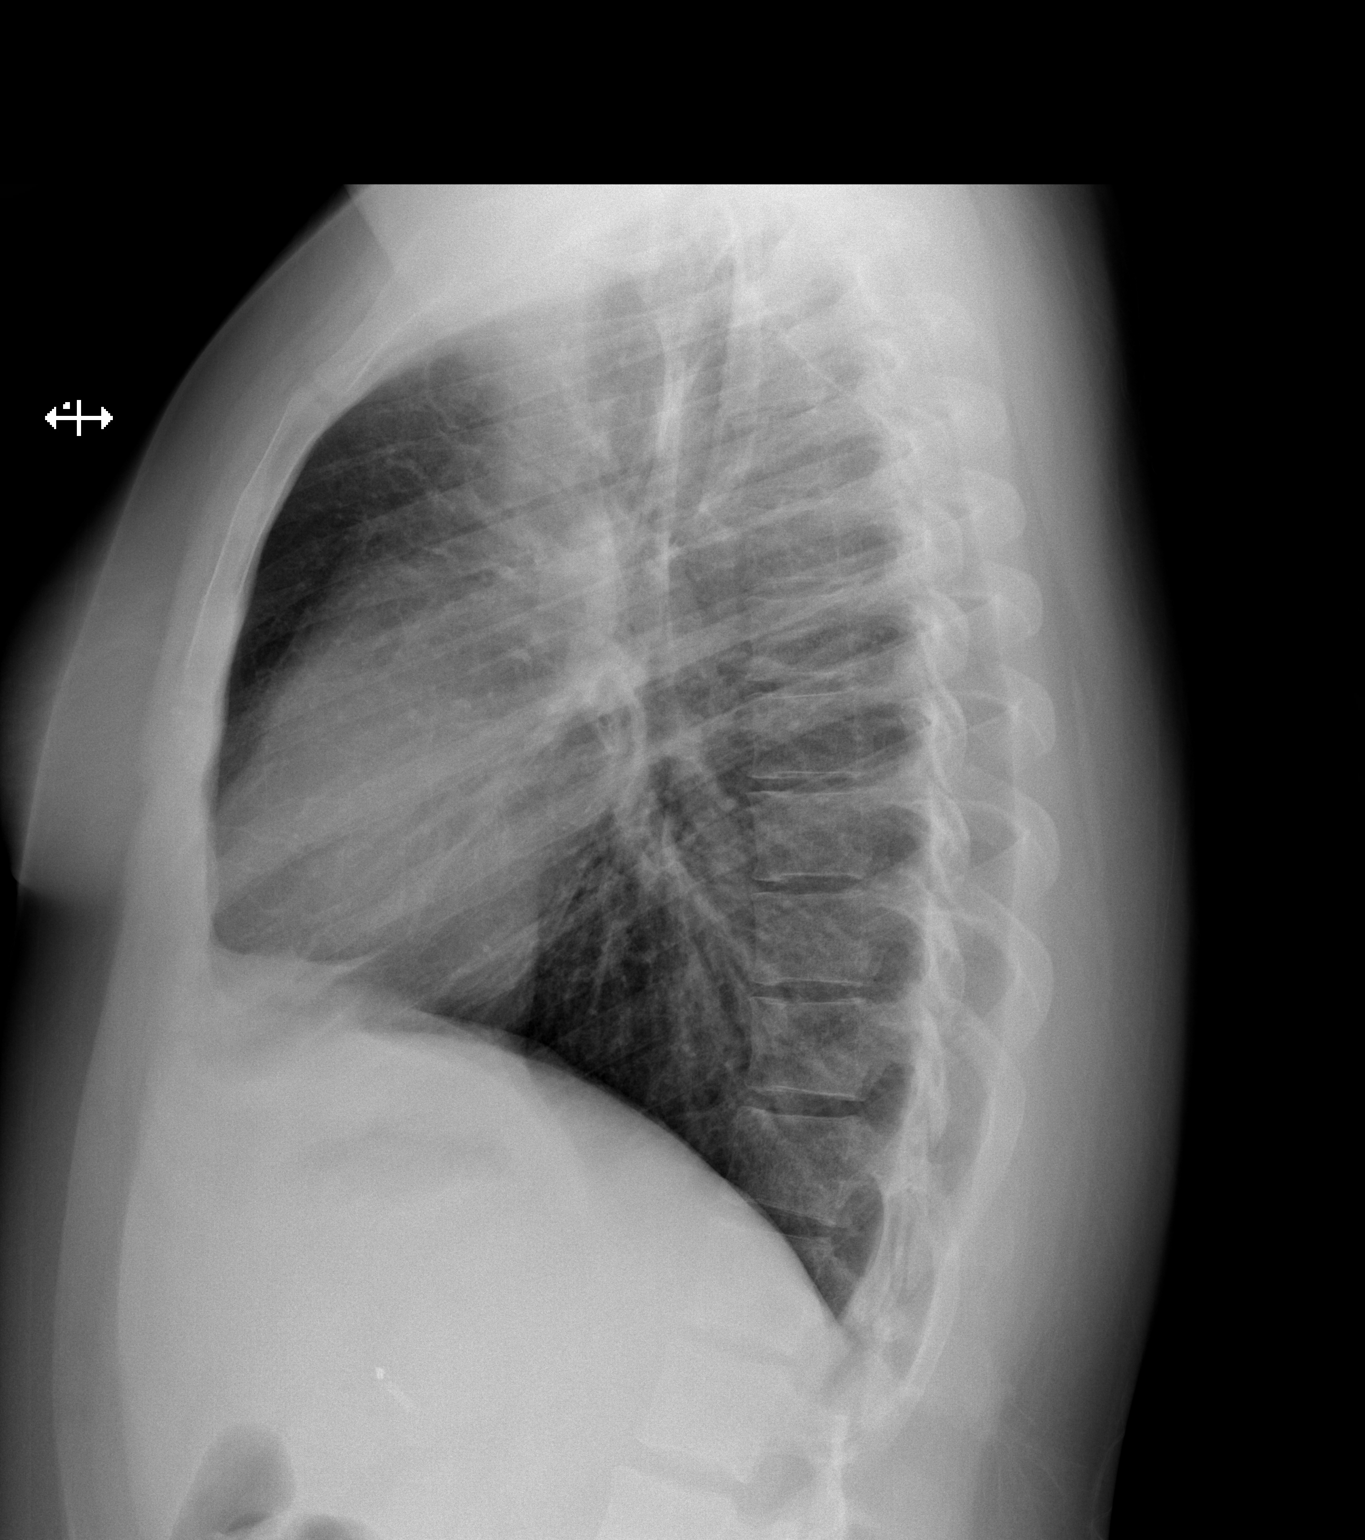

[2 of 2 positions shown; findings below may reference images not displayed]

FINDINGS: The lungs are mildly hyperinflated. Since the previous study there
has developed a small air bronchogram in the retrocardiac region
likely on the right. The interstitial markings elsewhere are coarse
but stable. The heart and pulmonary vascularity are normal. The
trachea is midline. There is no pleural effusion. The bony thorax is
unremarkable.
IMPRESSION: New subtle increased density in the right lower lobe is consistent
with early pneumonia or atelectasis. This is superimposed upon
findings of mild air trapping likely secondary to acute bronchitis.

## 2014-08-28 ENCOUNTER — Emergency Department: Payer: Self-pay | Admitting: Emergency Medicine

## 2014-08-28 LAB — CBC
HCT: 47.2 % — ABNORMAL HIGH (ref 35.0–47.0)
HGB: 15.2 g/dL (ref 12.0–16.0)
MCH: 30 pg (ref 26.0–34.0)
MCHC: 32.2 g/dL (ref 32.0–36.0)
MCV: 93 fL (ref 80–100)
Platelet: 264 10*3/uL (ref 150–440)
RBC: 5.06 10*6/uL (ref 3.80–5.20)
RDW: 13.5 % (ref 11.5–14.5)
WBC: 8.1 10*3/uL (ref 3.6–11.0)

## 2014-08-28 LAB — WET PREP, GENITAL

## 2014-08-28 LAB — HCG, QUANTITATIVE, PREGNANCY: Beta Hcg, Quant.: 204 m[IU]/mL — ABNORMAL HIGH

## 2014-08-28 IMAGING — US US OB < 14 WEEKS - US OB TV
1 series · 14 of 28 positions shown · non-contrast
Comparison: None.

CLINICAL DATA: Vaginal bleeding, pregnancy.

EXAM:
OBSTETRIC <14 WK US AND TRANSVAGINAL OB US
TECHNIQUE: Both transabdominal and transvaginal ultrasound examinations were
performed for complete evaluation of the gestation as well as the
maternal uterus, adnexal regions, and pelvic cul-de-sac.
Transvaginal technique was performed to assess early pregnancy.

[Series 1: us ob < 14 weeks - us ob tv · 0.21mm/px · 14 of 84 slices shown]
[im 4/84]
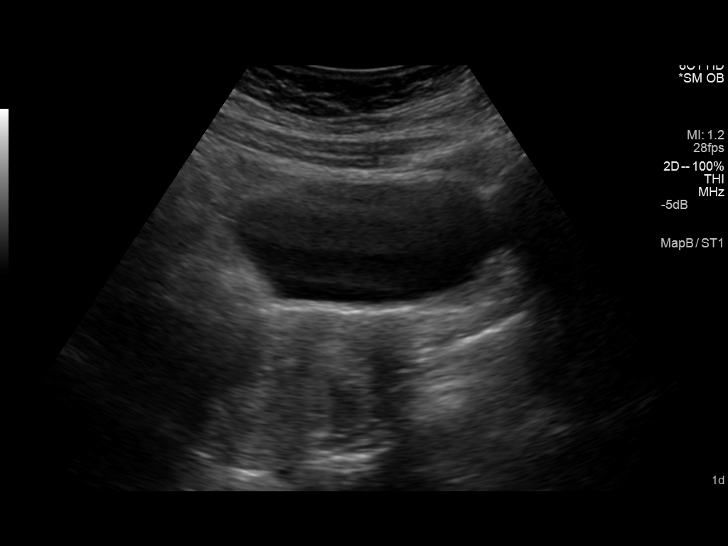
[im 10/84]
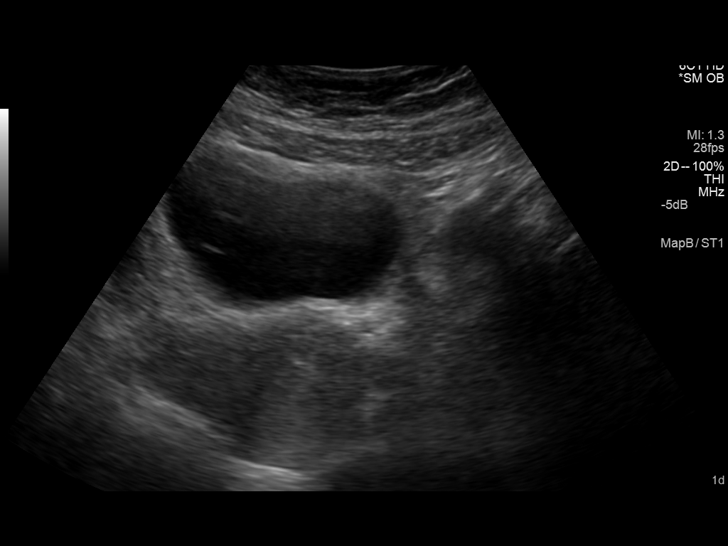
[im 16/84]
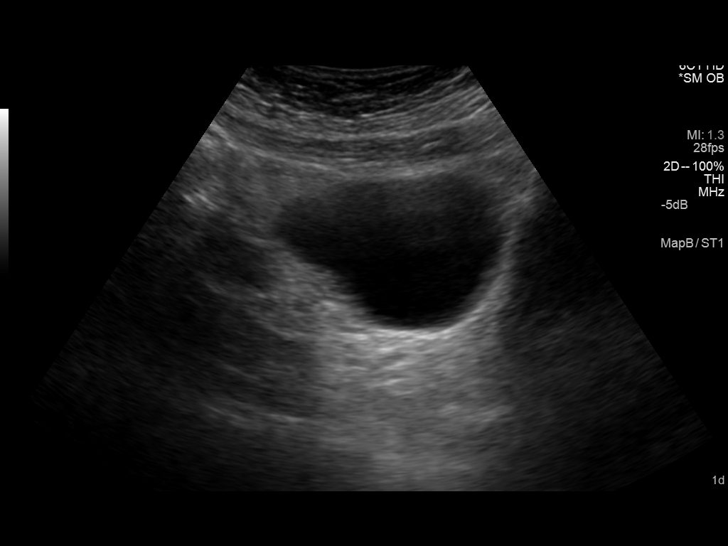
[im 22/84]
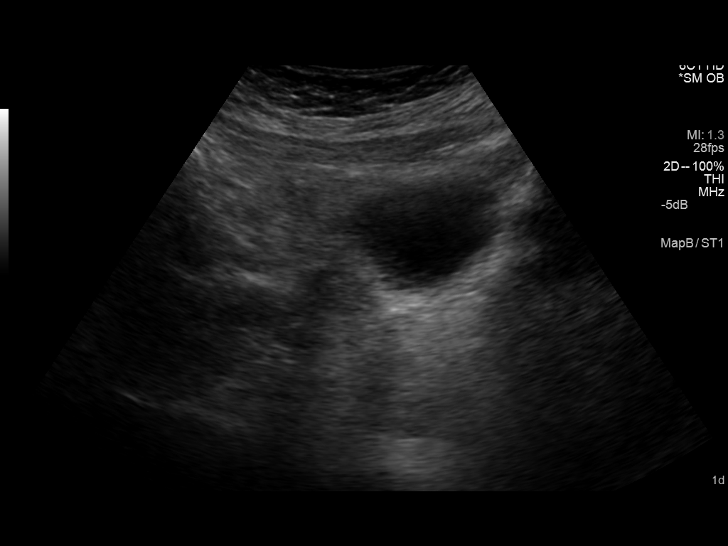
[im 28/84]
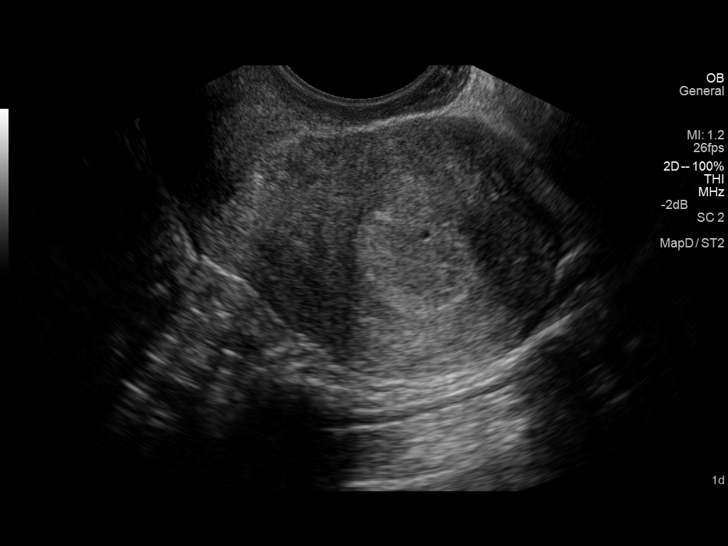
[im 34/84]
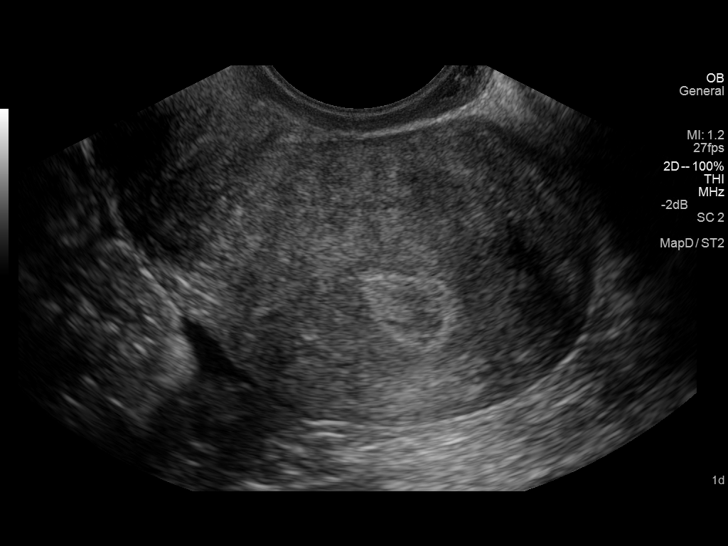
[im 40/84]
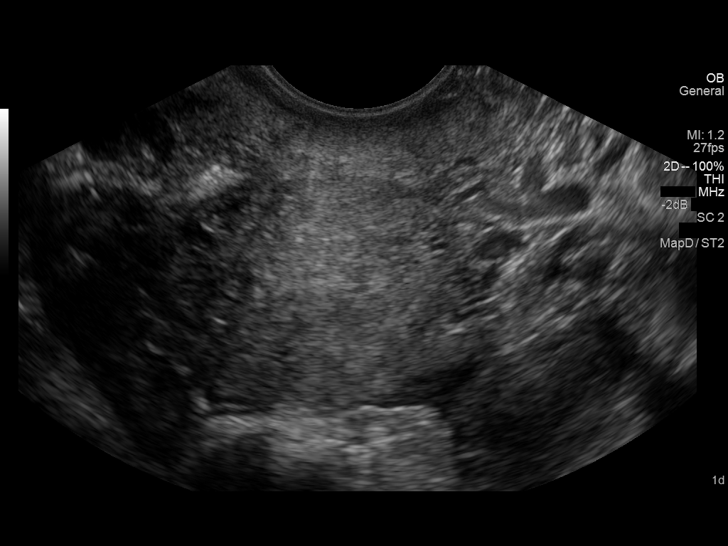
[im 47/84]
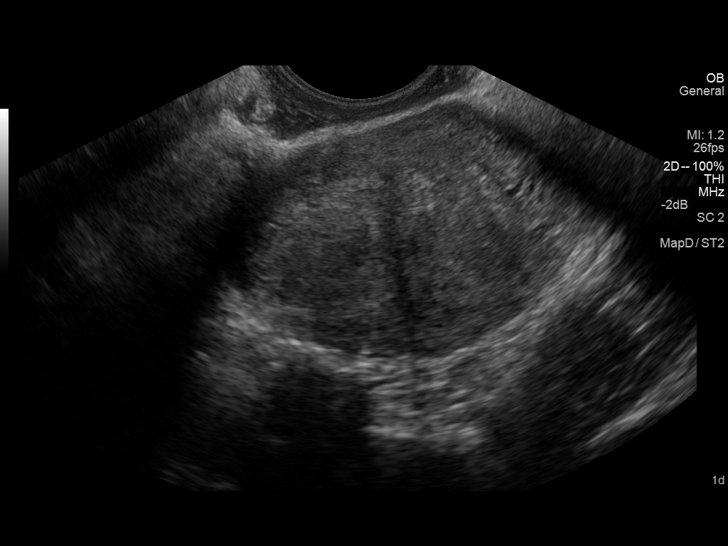
[im 53/84]
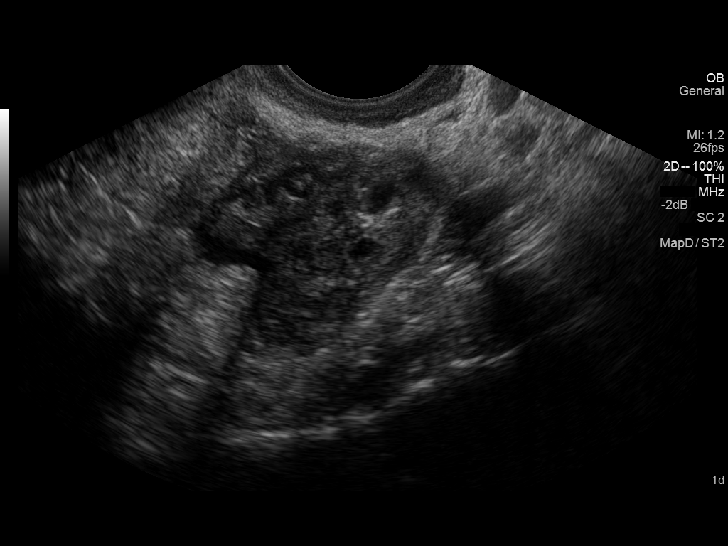
[im 59/84]
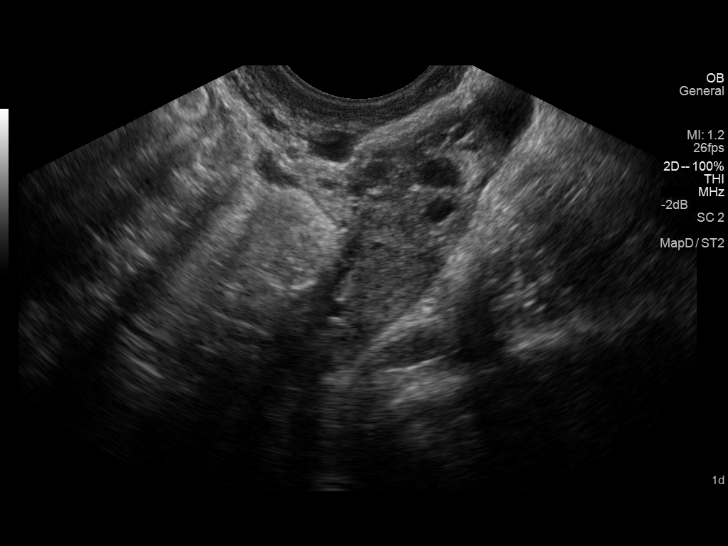
[im 65/84]
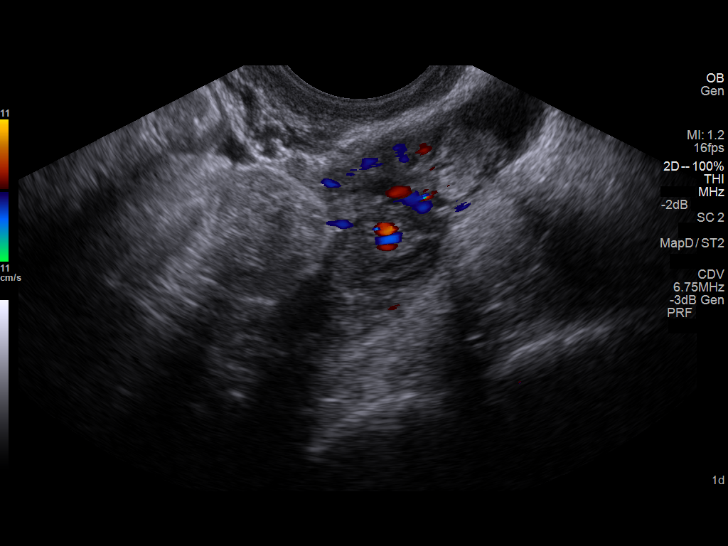
[im 71/84]
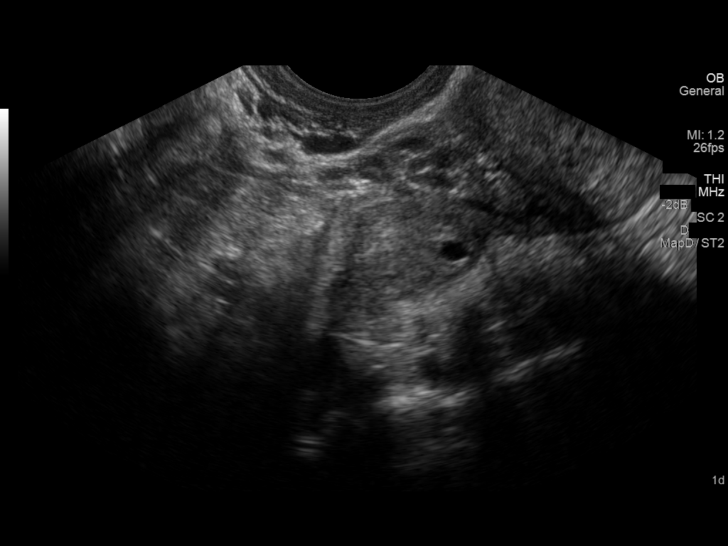
[im 77/84]
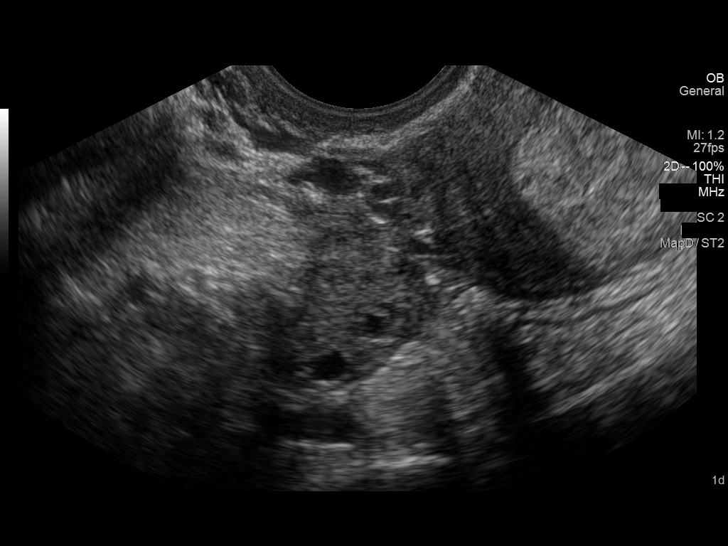
[im 84/84]
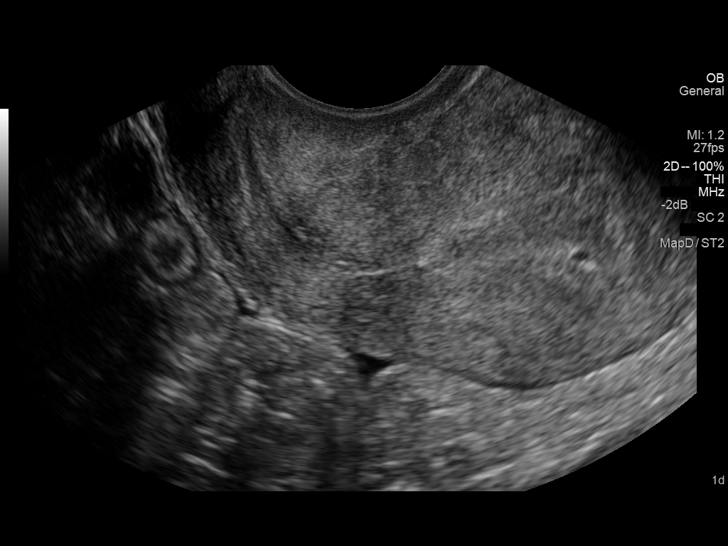

[14 of 28 positions shown; findings below may reference images not displayed]

FINDINGS: Intrauterine gestational sac: Not visualized.

Yolk sac:  Not visualized.

Embryo:  Not visualized.

Cardiac Activity: Not visualized.

Maternal uterus/adnexae: Endometrium appears to be thickened. Trace
amount of free fluid is noted in the pelvis which may be
physiologic. Possible small corpus luteum cyst seen in left ovary.
Right ovary appears normal
IMPRESSION: No intrauterine gestation or fluid collection is noted. In a
pregnant patient, the differential includes early intrauterine
pregnancy, nonvisualized ectopic pregnancy or missed abortion.
Correlation with beta HCG levels and follow-up ultrasound in 7-10
days is recommended.

## 2014-08-29 LAB — GC/CHLAMYDIA PROBE AMP

## 2014-08-30 ENCOUNTER — Emergency Department: Payer: Self-pay | Admitting: Emergency Medicine

## 2014-08-30 LAB — COMPREHENSIVE METABOLIC PANEL
Albumin: 3.3 g/dL — ABNORMAL LOW (ref 3.4–5.0)
Alkaline Phosphatase: 69 U/L
Anion Gap: 7 (ref 7–16)
BUN: 10 mg/dL (ref 7–18)
Bilirubin,Total: 0.2 mg/dL (ref 0.2–1.0)
Calcium, Total: 8.4 mg/dL — ABNORMAL LOW (ref 8.5–10.1)
Chloride: 106 mmol/L (ref 98–107)
Co2: 28 mmol/L (ref 21–32)
Creatinine: 0.81 mg/dL (ref 0.60–1.30)
EGFR (African American): >60
EGFR (Non-African Amer.): >60
Glucose: 109 mg/dL — ABNORMAL HIGH (ref 65–99)
Osmolality: 281 (ref 275–301)
Potassium: 4 mmol/L (ref 3.5–5.1)
SGOT(AST): 10 U/L — ABNORMAL LOW (ref 15–37)
SGPT (ALT): 15 U/L
Sodium: 141 mmol/L (ref 136–145)
Total Protein: 7.1 g/dL (ref 6.4–8.2)

## 2014-08-30 LAB — CBC WITH DIFFERENTIAL/PLATELET
Basophil #: 0 10*3/uL (ref 0.0–0.1)
Basophil %: 0.5 %
Eosinophil #: 0.2 10*3/uL (ref 0.0–0.7)
Eosinophil %: 2.1 %
HCT: 43.9 % (ref 35.0–47.0)
HGB: 14.3 g/dL (ref 12.0–16.0)
Lymphocyte #: 2.3 10*3/uL (ref 1.0–3.6)
Lymphocyte %: 26.2 %
MCH: 30.3 pg (ref 26.0–34.0)
MCHC: 32.7 g/dL (ref 32.0–36.0)
MCV: 93 fL (ref 80–100)
Monocyte #: 0.6 x10 3/mm (ref 0.2–0.9)
Monocyte %: 6.3 %
Neutrophil #: 5.8 10*3/uL (ref 1.4–6.5)
Neutrophil %: 64.9 %
Platelet: 266 10*3/uL (ref 150–440)
RBC: 4.74 10*6/uL (ref 3.80–5.20)
RDW: 13.4 % (ref 11.5–14.5)
WBC: 8.9 10*3/uL (ref 3.6–11.0)

## 2014-08-30 LAB — HCG, QUANTITATIVE, PREGNANCY: Beta Hcg, Quant.: 89 m[IU]/mL — ABNORMAL HIGH

## 2014-10-11 ENCOUNTER — Emergency Department: Payer: Self-pay | Admitting: Emergency Medicine

## 2014-10-11 LAB — URINALYSIS, COMPLETE
Bacteria: NONE SEEN
Bilirubin,UR: NEGATIVE
Blood: NEGATIVE
Glucose,UR: NEGATIVE mg/dL (ref 0–75)
Ketone: NEGATIVE
Leukocyte Esterase: NEGATIVE
Nitrite: NEGATIVE
Ph: 5 (ref 4.5–8.0)
Protein: 30
RBC,UR: 5 /HPF (ref 0–5)
Specific Gravity: 1.026 (ref 1.003–1.030)
Squamous Epithelial: 1
WBC UR: <1 /HPF (ref 0–5)

## 2014-10-11 LAB — CBC
HCT: 41.7 % (ref 35.0–47.0)
HGB: 14.1 g/dL (ref 12.0–16.0)
MCH: 31.2 pg (ref 26.0–34.0)
MCHC: 34 g/dL (ref 32.0–36.0)
MCV: 92 fL (ref 80–100)
Platelet: 251 10*3/uL (ref 150–440)
RBC: 4.53 10*6/uL (ref 3.80–5.20)
RDW: 13.3 % (ref 11.5–14.5)
WBC: 11.8 10*3/uL — ABNORMAL HIGH (ref 3.6–11.0)

## 2014-10-11 LAB — HCG, QUANTITATIVE, PREGNANCY: Beta Hcg, Quant.: 200 m[IU]/mL — ABNORMAL HIGH

## 2014-10-11 IMAGING — US US PELV - US TRANSVAGINAL
1 series · 13 of 25 positions shown · non-contrast
Comparison: Ultrasound [DATE].

CLINICAL DATA: Vaginal bleeding.

EXAM:
TRANSABDOMINAL AND TRANSVAGINAL ULTRASOUND OF PELVIS
TECHNIQUE: Both transabdominal and transvaginal ultrasound examinations of the
pelvis were performed. Transabdominal technique was performed for
global imaging of the pelvis including uterus, ovaries, adnexal
regions, and pelvic cul-de-sac. It was necessary to proceed with
endovaginal exam following the transabdominal exam to visualize the
endometrium and ovaries.

[Series 1: us pelv - us transvaginal · 0.20mm/px · 13 of 72 slices shown]
[im 1/72]
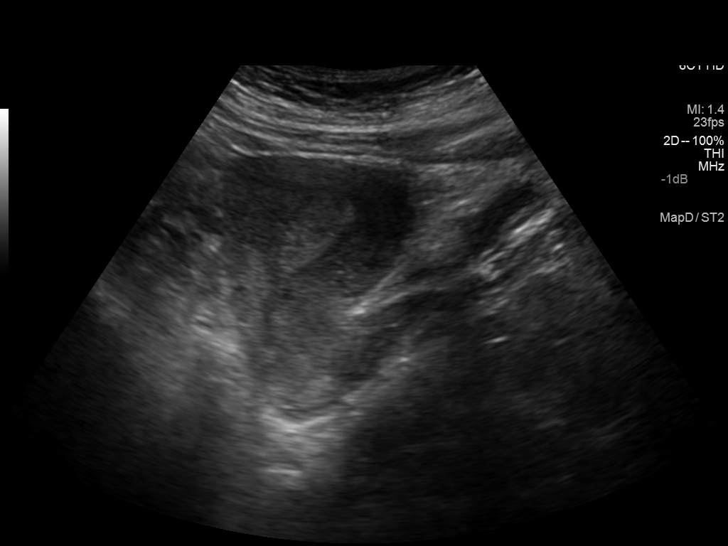
[im 6/72]
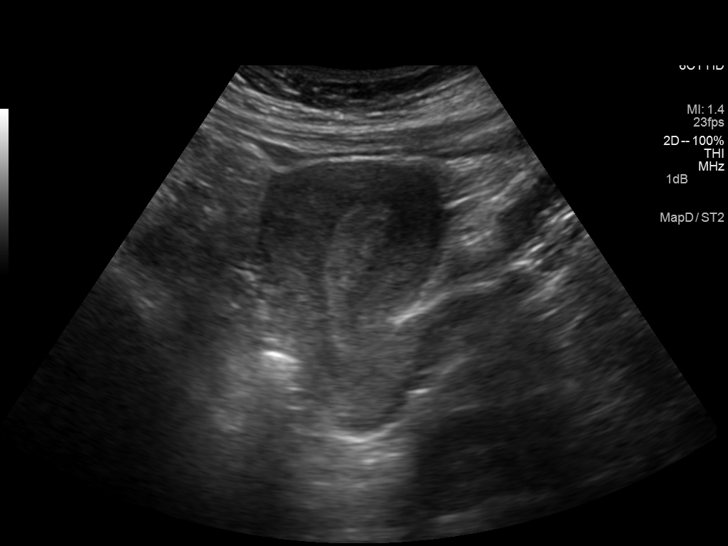
[im 12/72]
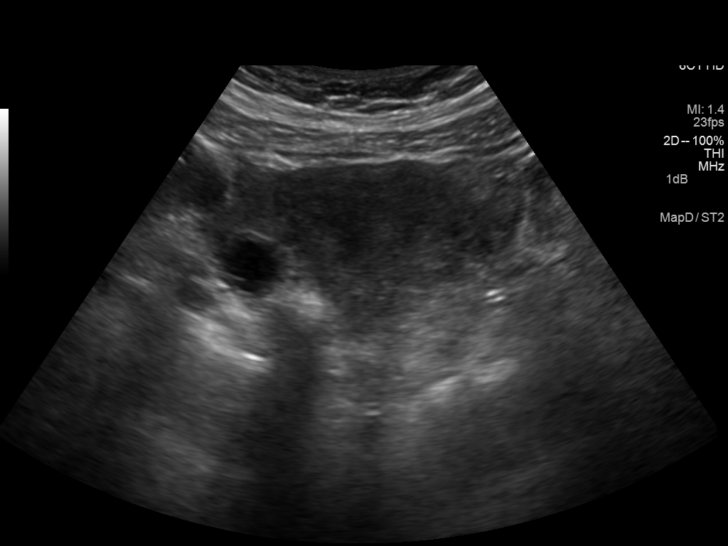
[im 18/72]
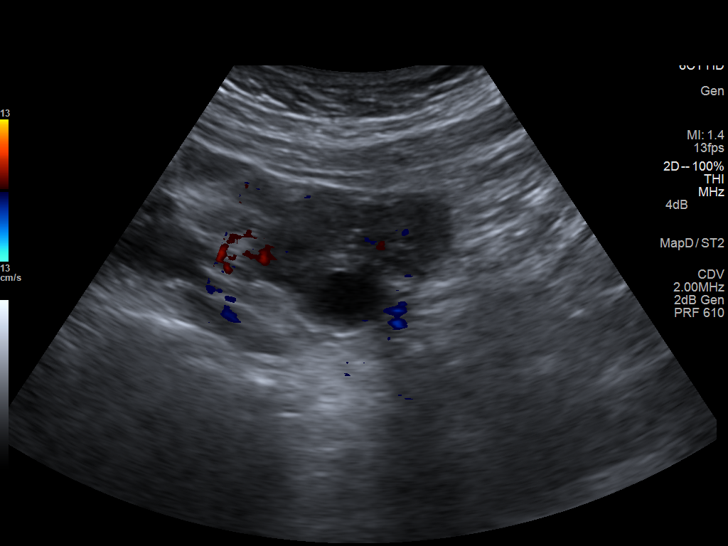
[im 24/72]
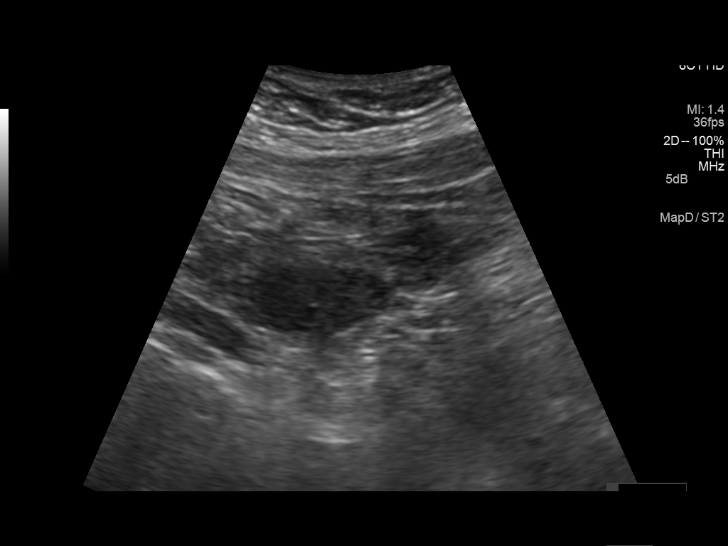
[im 30/72]
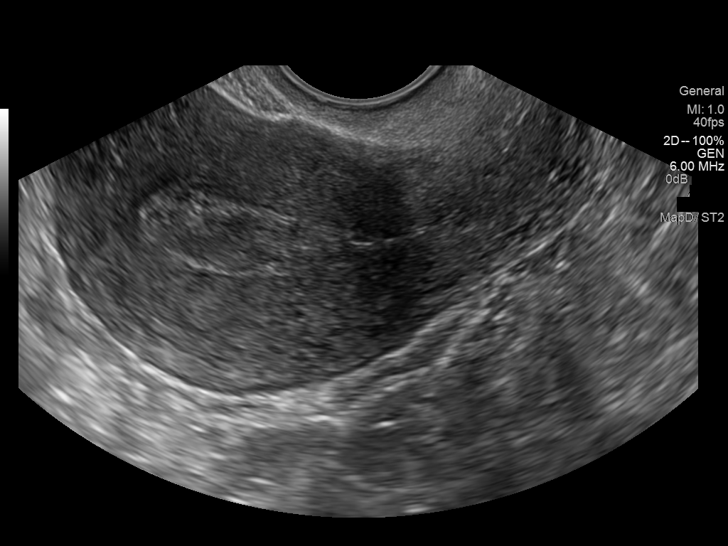
[im 36/72]
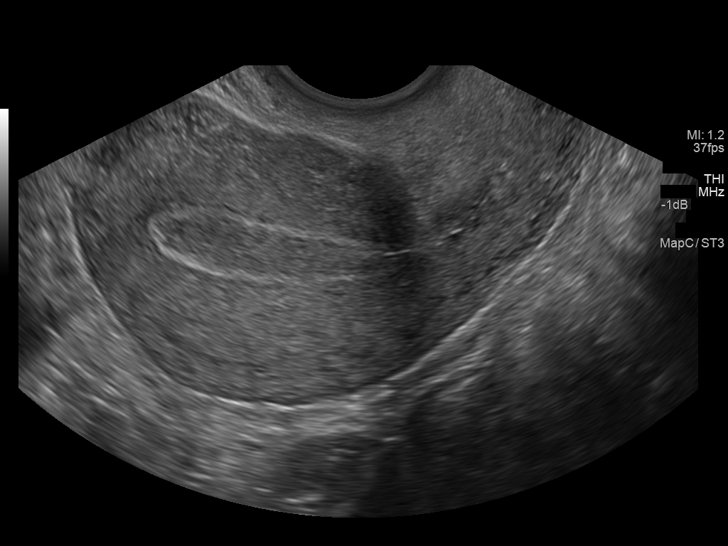
[im 42/72]
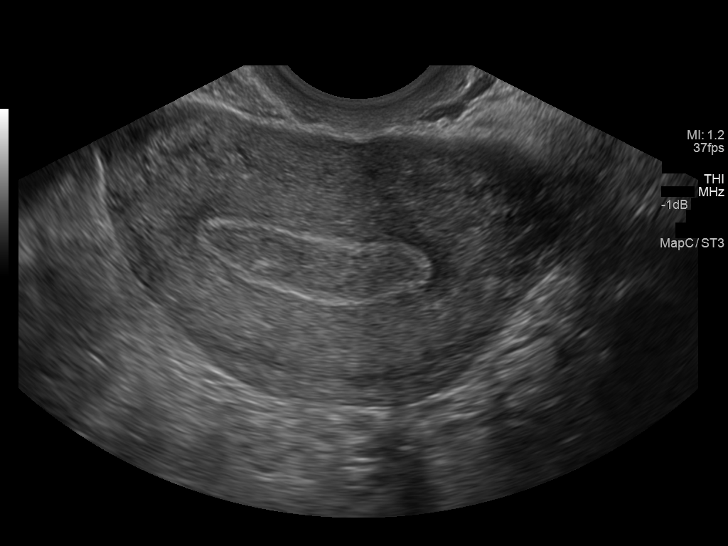
[im 48/72]
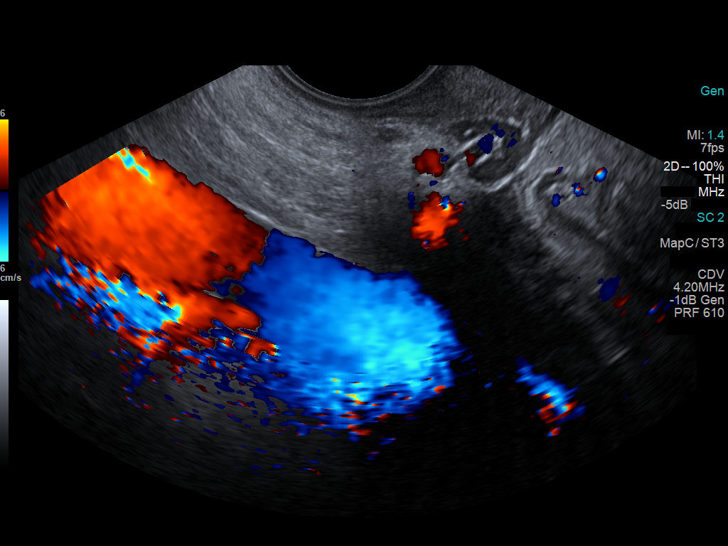
[im 54/72]
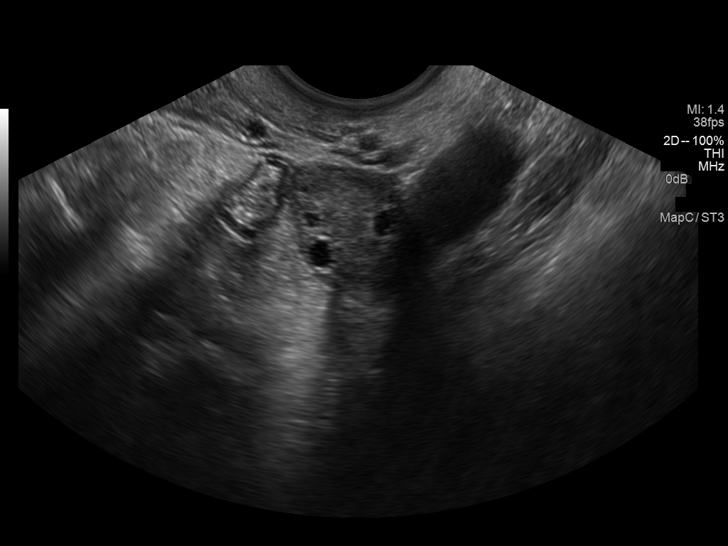
[im 60/72]
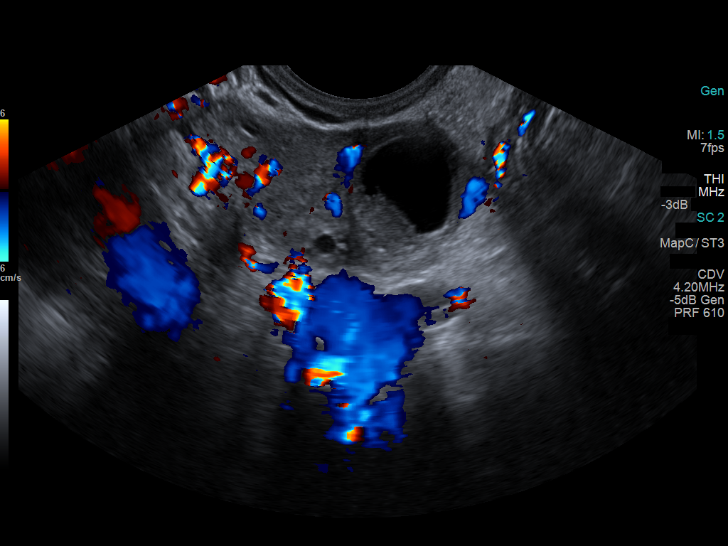
[im 66/72]
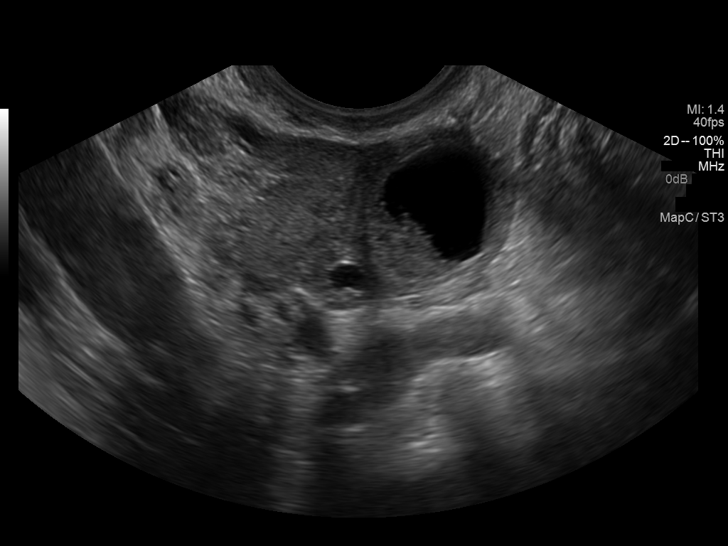
[im 72/72]
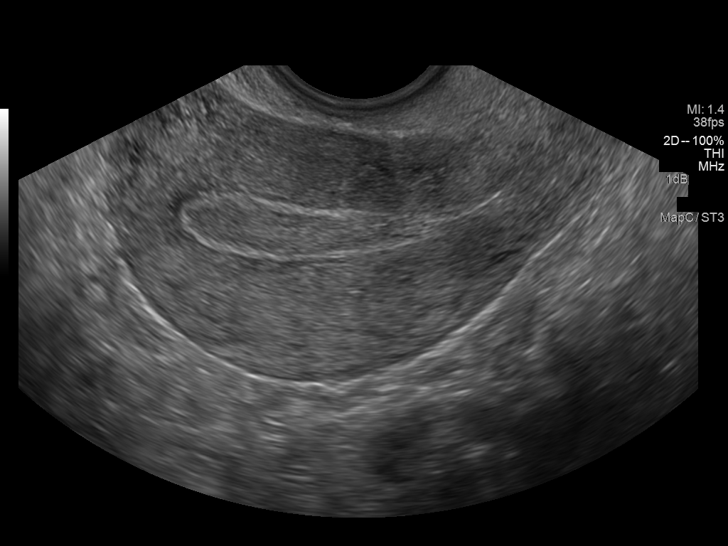

[13 of 25 positions shown; findings below may reference images not displayed]

FINDINGS: Uterus

Measurements: 7.5 x 5.9 x 5.2 cm. No fibroids or other mass
visualized.

Endometrium

Thickness: 10.9 mm.  No focal abnormality visualized.

Right ovary

Measurements: 3.5 x 2.2 x 1.6 cm. Complex cyst with internal
echogenicities is noted measuring 1.9 cm in diameter. This is most
consistent with hemorrhagic cyst.

Left ovary

Measurements: 2.6 x 1.9 x 1.6 cm. Normal appearance/no adnexal mass.

Other findings

No free fluid.
IMPRESSION: 1.9 cm complex cyst seen in right ovary with internal echogenicities
most consistent with hemorrhagic cyst in a nonpregnant patient.

However, if the patient is pregnant, potentially this may represent
corpus luteum cyst. No intrauterine fluid collection or gestational
sac is noted, and the differential in a pregnant patient would
include early intrauterine gestation, missed abortion or
nonvisualized ectopic pregnancy. Correlation with beta HCG levels is
recommended.

## 2014-12-02 ENCOUNTER — Emergency Department: Payer: Self-pay | Admitting: Emergency Medicine

## 2014-12-02 LAB — CBC WITH DIFFERENTIAL/PLATELET
Basophil #: 0 10*3/uL (ref 0.0–0.1)
Basophil %: 0.3 %
Eosinophil #: 0.3 10*3/uL (ref 0.0–0.7)
Eosinophil %: 2.1 %
HCT: 46 % (ref 35.0–47.0)
HGB: 15.4 g/dL (ref 12.0–16.0)
Lymphocyte #: 2.3 10*3/uL (ref 1.0–3.6)
Lymphocyte %: 16.1 %
MCH: 31.1 pg (ref 26.0–34.0)
MCHC: 33.5 g/dL (ref 32.0–36.0)
MCV: 93 fL (ref 80–100)
Monocyte #: 0.6 x10 3/mm (ref 0.2–0.9)
Monocyte %: 4.1 %
Neutrophil #: 11.2 10*3/uL — ABNORMAL HIGH (ref 1.4–6.5)
Neutrophil %: 77.4 %
Platelet: 247 10*3/uL (ref 150–440)
RBC: 4.95 10*6/uL (ref 3.80–5.20)
RDW: 13.1 % (ref 11.5–14.5)
WBC: 14.5 10*3/uL — ABNORMAL HIGH (ref 3.6–11.0)

## 2014-12-02 LAB — COMPREHENSIVE METABOLIC PANEL
Albumin: 3.6 g/dL (ref 3.4–5.0)
Alkaline Phosphatase: 75 U/L
Anion Gap: 6 — ABNORMAL LOW (ref 7–16)
BUN: 7 mg/dL (ref 7–18)
Bilirubin,Total: 0.5 mg/dL (ref 0.2–1.0)
Calcium, Total: 8.8 mg/dL (ref 8.5–10.1)
Chloride: 106 mmol/L (ref 98–107)
Co2: 27 mmol/L (ref 21–32)
Creatinine: 0.89 mg/dL (ref 0.60–1.30)
EGFR (African American): >60
EGFR (Non-African Amer.): >60
Glucose: 109 mg/dL — ABNORMAL HIGH (ref 65–99)
Osmolality: 276 (ref 275–301)
Potassium: 4.1 mmol/L (ref 3.5–5.1)
SGOT(AST): 13 U/L — ABNORMAL LOW (ref 15–37)
SGPT (ALT): 18 U/L
Sodium: 139 mmol/L (ref 136–145)
Total Protein: 7.3 g/dL (ref 6.4–8.2)

## 2014-12-02 LAB — URINALYSIS, COMPLETE
Bacteria: NONE SEEN
Bilirubin,UR: NEGATIVE
Glucose,UR: NEGATIVE mg/dL (ref 0–75)
Ketone: NEGATIVE
Nitrite: NEGATIVE
Ph: 6 (ref 4.5–8.0)
Protein: NEGATIVE
RBC,UR: 2 /HPF (ref 0–5)
Specific Gravity: 1.013 (ref 1.003–1.030)
Squamous Epithelial: 13
WBC UR: 9 /HPF (ref 0–5)

## 2014-12-02 LAB — LIPASE, BLOOD: Lipase: 90 U/L (ref 73–393)

## 2014-12-02 IMAGING — CT CT ABD-PELV W/ CM
2 of 4 series · 17 of 46 positions shown, 19 images · IV contrast (omnipaque)
Comparison: [DATE]

CLINICAL DATA: Patient ambulatory to triage with c/o midumbilical
abdominal pain that began at [A2] today. Nausea present,denies
vomiting, and denies diarrhea. HX: Chole.

EXAM:
CT ABDOMEN AND PELVIS WITH CONTRAST
TECHNIQUE: Multidetector CT imaging of the abdomen and pelvis was performed
using the standard protocol following bolus administration of
intravenous contrast.
CONTRAST:  100 mL of Omnipaque 300 intravenous contrast

[Series 2: routine abd pel with · axial · 0.71mm/px · z∈[-632,-232]mm · 14 of 88 slices shown, 16 images]
[im 4/88  soft-tissue]
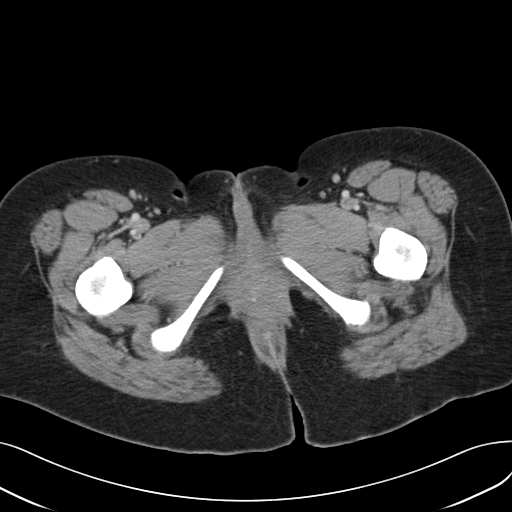
[im 4/88  bone]
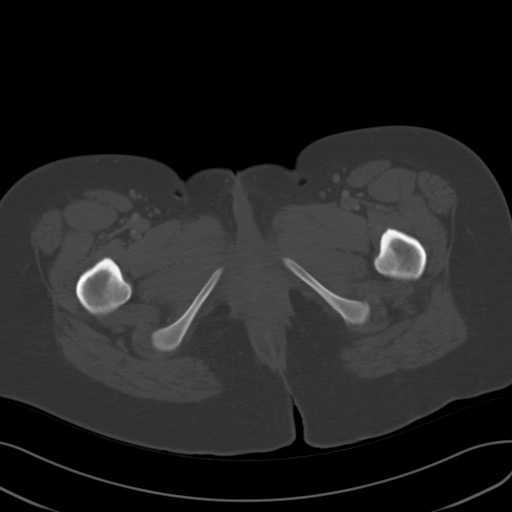
[im 11/88  soft-tissue]
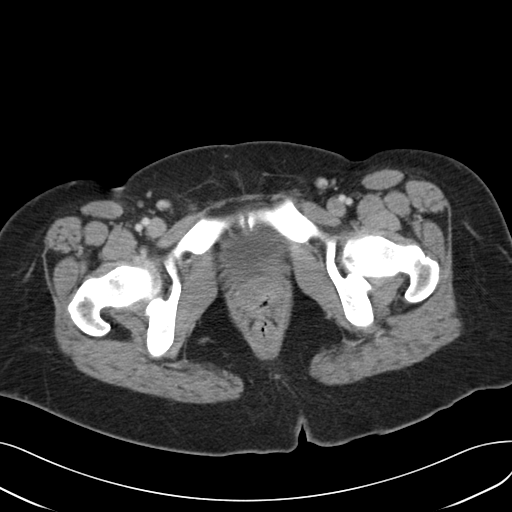
[im 19/88  soft-tissue]
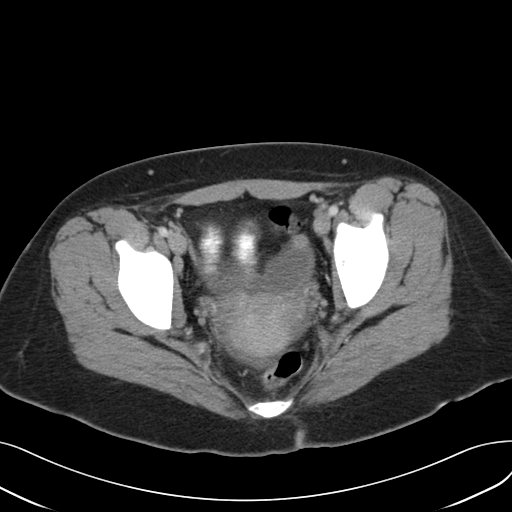
[im 22/88  soft-tissue]
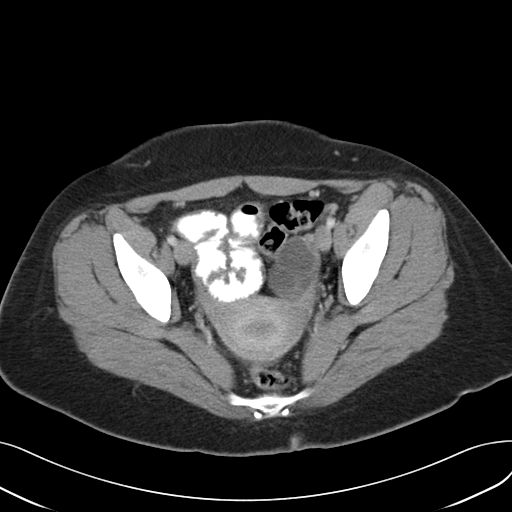
[im 30/88  soft-tissue]
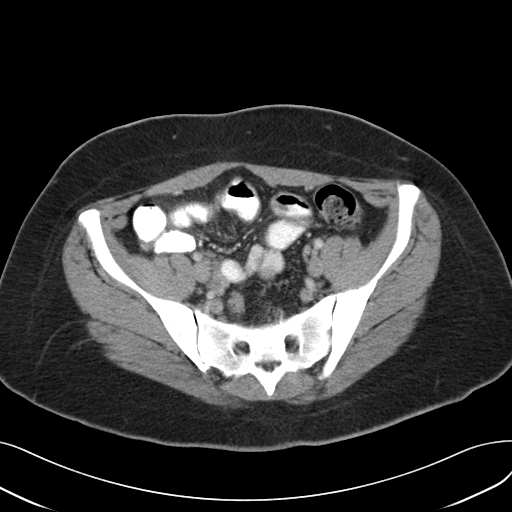
[im 37/88  soft-tissue]
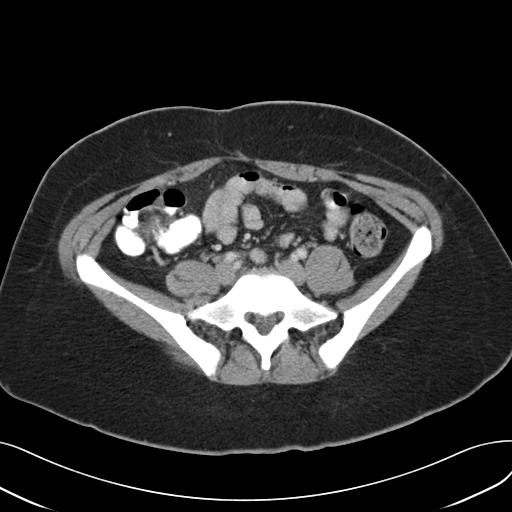
[im 40/88  soft-tissue]
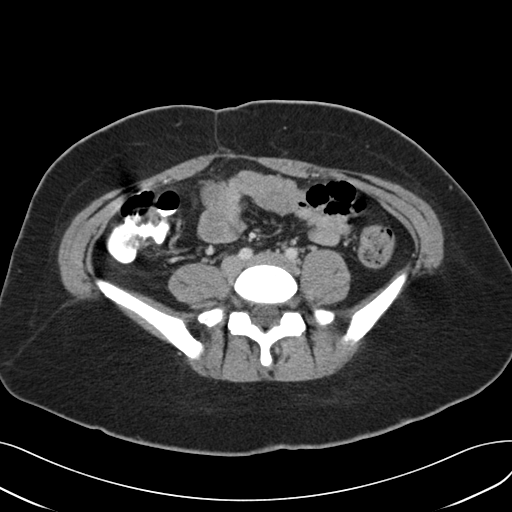
[im 48/88  soft-tissue]
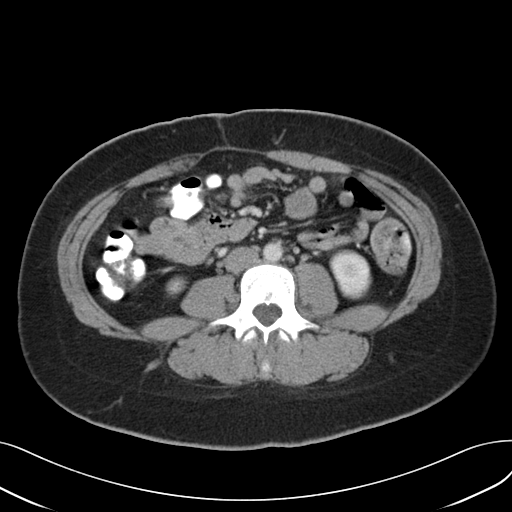
[im 51/88  soft-tissue]
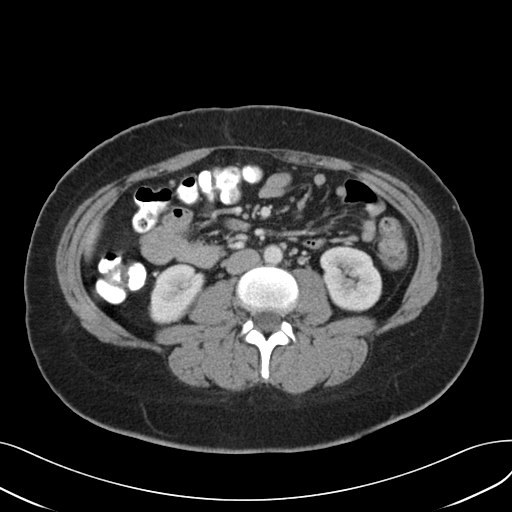
[im 51/88  bone]
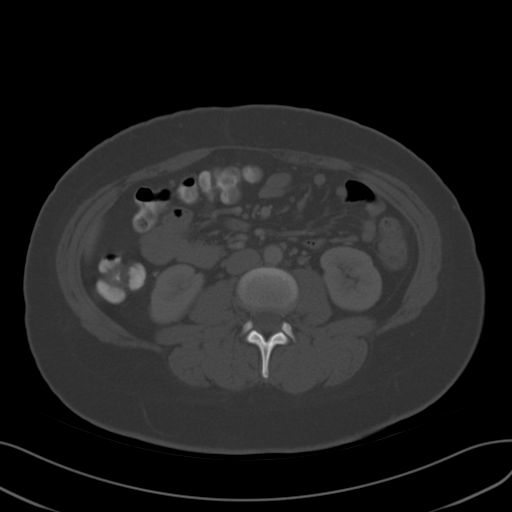
[im 59/88  soft-tissue]
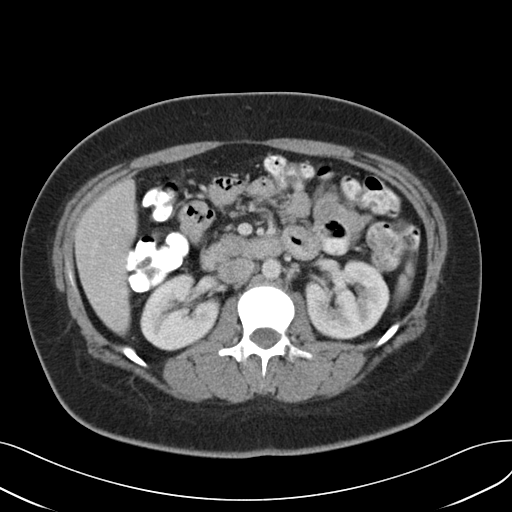
[im 66/88  soft-tissue]
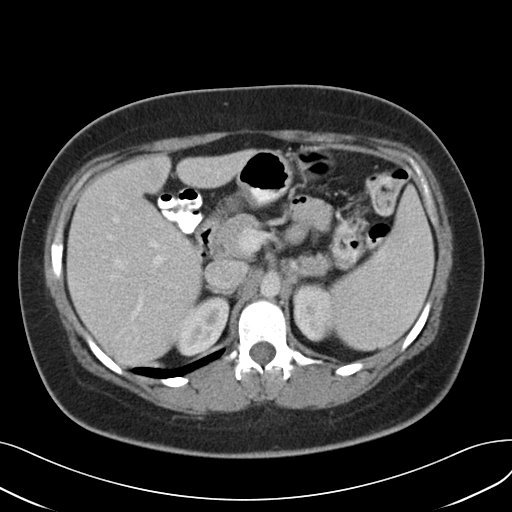
[im 69/88  soft-tissue]
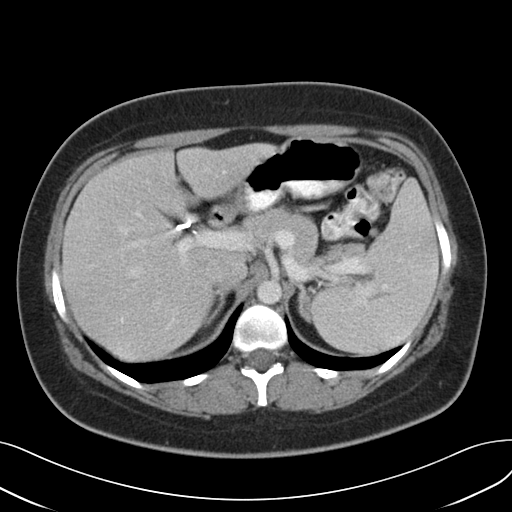
[im 77/88  soft-tissue]
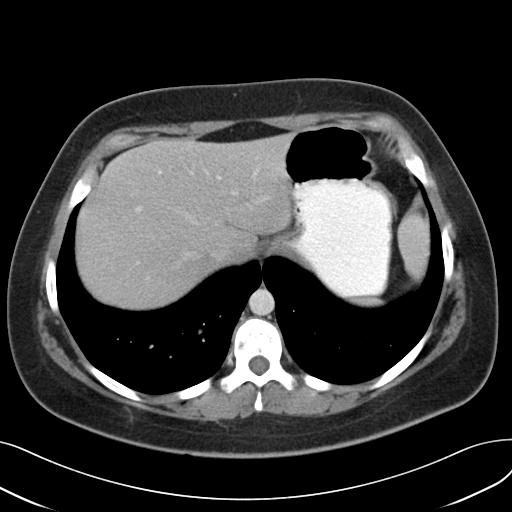
[im 84/88  soft-tissue]
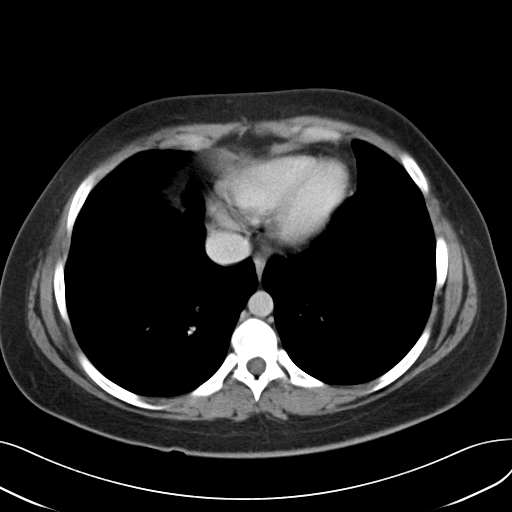

[Series 5: cor routine abd pel with · coronal · 0.76mm/px · 3 of 129 slices shown]
[im 43/129  soft-tissue]
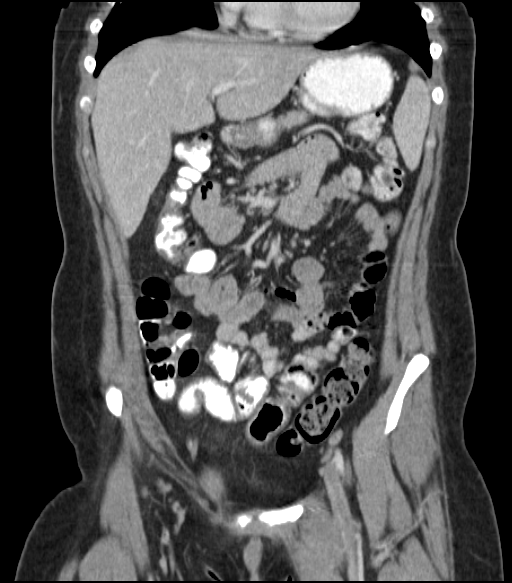
[im 57/129  soft-tissue]
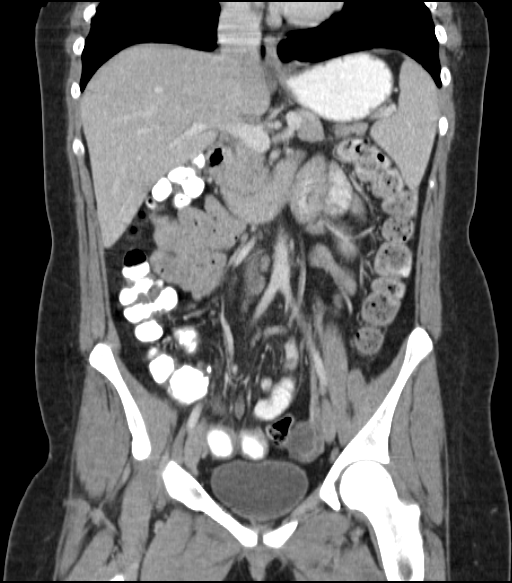
[im 72/129  soft-tissue]
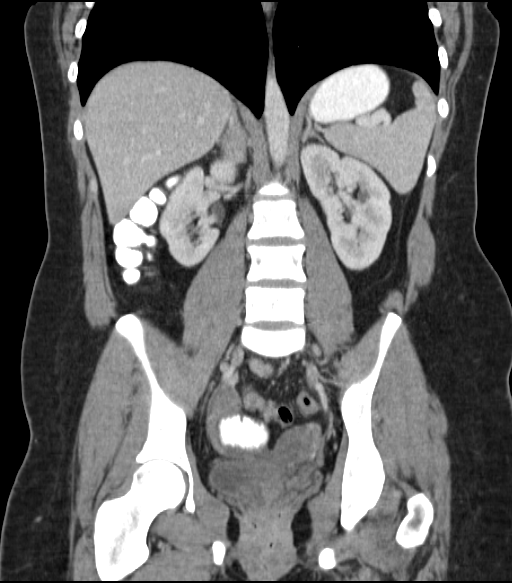

[17 of 46 positions shown; findings below may reference images not displayed]

FINDINGS: Clear lung bases.  Heart normal in size.

Liver, spleen, pancreas, adrenal glands: Normal. Gallbladder
surgically absent. No bile duct dilation.

Normal kidneys, ureters and bladder.

Left ovary is prominent measuring 4.6 cm in greatest dimension. This
size is within the normal physiologic range. Ovary likely prominent
from a physiologic cyst. Normal uterus. No adnexal masses. Trace
pelvic free fluid, physiologic.

No adenopathy.

There is mild prominence of the wall and folds of the distal ileum
without associated mesenteric inflammation. This may reflect a mild
infectious or inflammatory enteritis. Remainder of the small bowel
is unremarkable. Normal:. Normal appendix.

No bony abnormality.
IMPRESSION: 1. Mild prominence of the wall and falls of the distal ileum
consistent with a mild infectious or inflammatory enteritis.
2. No other evidence of an acute abnormality. Normal appendix
visualized.

## 2015-11-10 NOTE — L&D Delivery Note (Signed)
Delivery Summary for Isabel [name]  Labor Events:   Preterm labor:   Rupture date:   Rupture time:   Rupture type: Artificial  Fluid Color: Clear  Induction:   Augmentation:   Complications:   Cervical ripening:          Delivery:   Episiotomy:   Lacerations:   Repair suture:   Repair # of packets:   Blood loss (ml): 200   Information for the patient's newborn:  Semika, Zou [id number]    Delivery 11/02/2016 3:53 PM by  Vaginal, Spontaneous Delivery Sex:  female Gestational Age: [redacted]w[redacted]d Delivery Clinician:   Living?:         APGARS  One minute Five minutes Ten minutes  Skin color:        Heart rate:        Grimace:        Muscle tone:        Breathing:        Totals: 8  9      Presentation/position:      Resuscitation:   Cord information:    Disposition of cord blood:     Blood gases sent?  Complications:   Placenta: Delivered:       appearance Newborn Measurements: Weight: 6 lb 8.4 oz (2960 g)  Height: 19.37"  Head circumference:    Chest circumference:    Other providers:    Additional  information: Forceps:   Vacuum:   Breech:   Observed anomalies       Delivery Note At 3:53 PM a viable and non-viable female was delivered via Vaginal, Spontaneous Delivery (Presentation: Vertex;  ROA position).  APGAR: 8, 9; weight 6 lb 8.4 oz (2960 g).   Placenta status: removed spontaneously, intact.  Cord: 3-vessel, with the following complications: None.  Cord pH: not obtained.   Anesthesia: IV sedation Episiotomy: None Lacerations: None Suture Repair: none Est. Blood Loss (mL):  200  Mom to postpartum.  Baby to Couplet care / Skin to Skin.  [name] 11/03/2016, 11:32 PM

## 2016-02-26 ENCOUNTER — Ambulatory Visit (INDEPENDENT_AMBULATORY_CARE_PROVIDER_SITE_OTHER): Payer: Medicaid Other | Admitting: Obstetrics and Gynecology

## 2016-02-26 ENCOUNTER — Encounter: Payer: Self-pay | Admitting: Obstetrics and Gynecology

## 2016-02-26 VITALS — BP 102/65 | HR 90 | Ht 64.0 in | Wt 139.6 lb

## 2016-02-26 DIAGNOSIS — Z720 Tobacco use: Secondary | ICD-10-CM

## 2016-02-26 DIAGNOSIS — N926 Irregular menstruation, unspecified: Secondary | ICD-10-CM | POA: Diagnosis not present

## 2016-02-26 DIAGNOSIS — N912 Amenorrhea, unspecified: Secondary | ICD-10-CM

## 2016-02-26 LAB — POCT URINE PREGNANCY: Preg Test, Ur: POSITIVE — AB

## 2016-02-26 MED ORDER — CONCEPT DHA 53.5-38-1 MG PO CAPS
53.5000 mg | ORAL_CAPSULE | Freq: Every day | ORAL | Status: DC
Start: 2016-02-26 — End: 2018-06-22

## 2016-02-26 NOTE — Progress Notes (Signed)
Chief complaint: 1. Positive home pregnancy test 2. Pregnancy confirmation  25 year old white female gravida 4 para 1021, LMP-unsure, with history of irregular menstrual cycles, having a positive home pregnancy test, presents for pregnancy confirmation. Patient denies pelvic pain. Patient denies nausea and vomiting. Patient denies breast tenderness. Patient smokes cigarettes.  Past Medical History  Diagnosis Date  . Migraines    Past Surgical History  Procedure Laterality Date  . Cholecystectomy     OBJECTIVE: BP 102/65 mmHg  Pulse 90  Ht 5\' 4"  (1.626 m)  Wt 139 lb 9.6 oz (63.322 kg)  BMI 23.95 kg/m2  LMP 01/23/2016 (Approximate) Pleasant white female in no acute distress. Alert and oriented. Physical exam deferred UPT-faintly positive  ASSESSMENT: 1. Possible early pregnancy, irregular menstrual cycle  PLAN: 1. Quantitative hCG on 02/26/2016 and 02/28/2016 2. Return in 1 week for further management planning.  Brayton Mars, MD  Note: This dictation was prepared with Dragon dictation along with smaller phrase technology. Any transcriptional errors that result from this process are unintentional.

## 2016-02-26 NOTE — Patient Instructions (Signed)
1. Quantitative hCG is drawn today 2. Repeat quantitative hCG will be drawn 02/28/2016 3. Return in 1 week for follow-up and further management

## 2016-02-27 LAB — BETA HCG QUANT (REF LAB): hCG Quant: 70 m[IU]/mL

## 2016-02-28 ENCOUNTER — Other Ambulatory Visit: Payer: Medicaid Other

## 2016-02-28 DIAGNOSIS — N912 Amenorrhea, unspecified: Secondary | ICD-10-CM

## 2016-02-29 LAB — BETA HCG QUANT (REF LAB): hCG Quant: 184 m[IU]/mL

## 2016-03-05 ENCOUNTER — Telehealth: Payer: Self-pay

## 2016-03-05 ENCOUNTER — Ambulatory Visit: Payer: Medicaid Other | Admitting: Obstetrics and Gynecology

## 2016-03-05 DIAGNOSIS — Z3687 Encounter for antenatal screening for uncertain dates: Secondary | ICD-10-CM

## 2016-03-05 NOTE — Telephone Encounter (Signed)
Pt aware. U/s ordered and scheduled. Appt made to see mad after.

## 2016-03-05 NOTE — Telephone Encounter (Signed)
-----   Message from Brayton Mars, MD sent at 03/05/2016  1:28 PM EDT ----- Please Notify - Labs normal HCG titers are rising consistent with normal early pregnancy Consider ultrasound in 2 weeks to assess for viable intrauterine pregnancy

## 2016-03-19 ENCOUNTER — Encounter: Payer: Self-pay | Admitting: Obstetrics and Gynecology

## 2016-03-19 ENCOUNTER — Ambulatory Visit: Payer: Medicaid Other | Admitting: Obstetrics and Gynecology

## 2016-03-19 ENCOUNTER — Ambulatory Visit (INDEPENDENT_AMBULATORY_CARE_PROVIDER_SITE_OTHER): Payer: Medicaid Other

## 2016-03-19 ENCOUNTER — Ambulatory Visit (INDEPENDENT_AMBULATORY_CARE_PROVIDER_SITE_OTHER): Payer: Medicaid Other | Admitting: Obstetrics and Gynecology

## 2016-03-19 VITALS — BP 107/63 | HR 86 | Ht 64.0 in | Wt 137.8 lb

## 2016-03-19 DIAGNOSIS — Z3687 Encounter for antenatal screening for uncertain dates: Secondary | ICD-10-CM

## 2016-03-19 DIAGNOSIS — Z36 Encounter for antenatal screening of mother: Secondary | ICD-10-CM | POA: Diagnosis not present

## 2016-03-19 DIAGNOSIS — Z3491 Encounter for supervision of normal pregnancy, unspecified, first trimester: Secondary | ICD-10-CM | POA: Diagnosis not present

## 2016-03-19 LAB — OB RESULTS CONSOLE VARICELLA ZOSTER ANTIBODY, IGG: Varicella: IMMUNE

## 2016-03-19 NOTE — Patient Instructions (Signed)
1. Prenatal labs today 2. Maintain oral hydration and consider BRAT diet for nausea. Call for anti nausea medication if vomiting develops 3. New OB nursing intake in 2 weeks

## 2016-03-19 NOTE — Progress Notes (Signed)
Patient ID: TYAUNA EDMUNDS, female   DOB: [birth year], 25 y.o.   MRN: [id number] GYN ENCOUNTER NOTE  Subjective:       Isabel [name] is a 25 y.o. [phone] female is here for gynecologic evaluation of the following issues:  1. Follow up on dating scan. Patient was unsure of date of LMP. 2 positive Quantitative hCG on 4/19 and 4/21. Patient reports currently experiencing nausea w/o vomiting and mild swelling in her feet with prolonged standing. Denies HA, pain, spotting or bleeding.    Gynecologic History Patient's last menstrual period was 01/23/2016 (approximate).   Obstetric History OB History  Gravida Para Term Preterm AB SAB TAB Ectopic Multiple Living  3 1 1  2 2    1     # Outcome Date GA Lbr Len/2nd Weight Sex Delivery Anes PTL Lv  3 SAB 2016          2 SAB 2016          1 Term 2011   7 lb 1.8 oz (3.225 kg) F Vag-Spont   Y      Past Medical History  Diagnosis Date  . Migraines     Past Surgical History  Procedure Laterality Date  . Cholecystectomy      Current Outpatient Prescriptions on File Prior to Visit  Medication Sig Dispense Refill  . Prenat-FeFum-FePo-FA-Omega 3 (CONCEPT DHA) 53.5-38-1 MG CAPS Take 53.5 mg by mouth daily. 30 capsule 11   No current facility-administered medications on file prior to visit.    Allergies  Allergen Reactions  . Amoxicillin Hives  . Penicillins Hives    Social History   Social History  . Marital Status: Single    Spouse Name: N/A  . Number of Children: N/A  . Years of Education: 12   Occupational History  . Not on file.   Social History Main Topics  . Smoking status: Current Every Day Smoker -- 0.50 packs/day    Types: Cigarettes  . Smokeless tobacco: Not on file  . Alcohol Use: No  . Drug Use: Yes    Special: Marijuana     Comment: last smoked - 01/23/2016- helps with her cramps- smokes weekly  . Sexual Activity: Yes    Birth Control/ Protection: Condom   Other Topics Concern  . Not on file   Social  History Narrative    Family History  Problem Relation Age of Onset  . Arthritis    . Diabetes    . Cancer    . Breast cancer Mother   . Ovarian cancer Mother   . Diabetes Maternal Grandmother   . Diabetes Maternal Grandfather   . Diabetes Paternal Grandmother   . Diabetes Paternal Grandfather   . Colon cancer Neg Hx   . Heart disease Neg Hx     The following portions of the patient's history were reviewed and updated as appropriate: allergies, current medications, past family history, past medical history, past social history, past surgical history and problem list.  Review of Systems Review of Systems - Negative except as reported in HPI   Objective:   BP 107/63 mmHg  Pulse 86  Ht 5\' 4"  (1.626 m)  Wt 137 lb 12.8 oz (62.506 kg)  BMI 23.64 kg/m2  LMP 01/23/2016 (Approximate) CONSTITUTIONAL: Well-developed, well-nourished female in no acute distress.  HENT:  Normocephalic, atraumatic.  NECK: Normal range of motion, supple, no masses.  Normal thyroid.  SKIN: Skin is warm and dry. No rash noted. Not diaphoretic. No erythema. No  pallor. Isabel [name]: Alert and oriented to person, place, and time. PSYCHIATRIC: Normal mood and affect. Normal behavior. Normal judgment and thought content. CARDIOVASCULAR:Not Examined RESPIRATORY: Not Examined BREASTS: Not Examined ABDOMEN: Soft, non distended; Non tender.  No Organomegaly. PELVIC:  External Genitalia: Normal  BUS: Normal  Vagina: Normal  Cervix: Normal  Uterus: Normal size, shape,consistency, mobile  Adnexa: Normal  RV: Normal   Bladder: Nontender MUSCULOSKELETAL: Normal range of motion. No tenderness.  No cyanosis, clubbing, or edema.  [place]: Indications: Dating. Unsure LMP Findings:  Isabel [name] intrauterine pregnancy is visualized with a CRL consistent with 7 1/[redacted] weeks gestation, giving an (U/S) EDD of 11/04/16.  FHR: 130 CRL measurement: 10.4 mm Yolk sac and and early anatomy is normal.  Right Ovary measures 3.1 x 2.2  x 2.4 cm. It is normal in appearance. Left Ovary measures 2.9 x 1.7 x 1.4 cm. It is normal appearance. There is evidence of a corpus luteal cyst in the Right Survey of the adnexa demonstrates no adnexal masses. There is no free peritoneal fluid in the cul de sac.  Impression: 1. 7 1/7 week Viable Singleton Intrauterine pregnancy by U/S.  Assessment:   1. F/u for dating US - done 5/11 - consistent with 7 wk 1d, EDD 11/04/16  2. Early Pregnancy  - prenatal labs today - will need to establish OB care  Plan:   1. Prenatal labs drawn today. 2. Follow up in 3 weeks for new OB nurse intake visit. 3. Follow up in 5 weeks for new OB physical. 4. Advised on BRAT diet for n/v. Pt instructed to call for antiemetic if unable to keep food and water down.  A total of 15 minutes were spent face-to-face with the patient during this encounter and over half of that time dealt with counseling and coordination of care.  Claiborne Billings Rayburn PA-S Brayton Mars, MD    I have seen, interviewed, and examined the patient in conjunction with the [facility].A. student and affirm the diagnosis and management plan. Martin A. DeFrancesco, MD, FACOG    Note: This dictation was prepared with Dragon dictation along with smaller phrase technology. Any transcriptional errors that result from this process are unintentional.

## 2016-03-20 LAB — CBC WITH DIFFERENTIAL/PLATELET
Basophils Absolute: 0 10*3/uL (ref 0.0–0.2)
Basos: 0 %
EOS (ABSOLUTE): 0.4 10*3/uL (ref 0.0–0.4)
Eos: 3 %
Hematocrit: 42.3 % (ref 34.0–46.6)
Hemoglobin: 14.7 g/dL (ref 11.1–15.9)
Immature Grans (Abs): 0 10*3/uL (ref 0.0–0.1)
Immature Granulocytes: 0 %
Lymphocytes Absolute: 2.9 10*3/uL (ref 0.7–3.1)
Lymphs: 25 %
MCH: 32.5 pg (ref 26.6–33.0)
MCHC: 34.8 g/dL (ref 31.5–35.7)
MCV: 93 fL (ref 79–97)
Monocytes Absolute: 0.6 10*3/uL (ref 0.1–0.9)
Monocytes: 6 %
Neutrophils Absolute: 7.6 10*3/uL — ABNORMAL HIGH (ref 1.4–7.0)
Neutrophils: 66 %
Platelets: 273 10*3/uL (ref 150–379)
RBC: 4.53 x10E6/uL (ref 3.77–5.28)
RDW: 13.8 % (ref 12.3–15.4)
WBC: 11.5 10*3/uL — ABNORMAL HIGH (ref 3.4–10.8)

## 2016-03-20 LAB — "ABO AND RH ": Rh Factor: POSITIVE

## 2016-03-20 LAB — HIV ANTIBODY (ROUTINE TESTING W REFLEX): HIV Screen 4th Generation wRfx: NONREACTIVE

## 2016-03-20 LAB — VARICELLA ZOSTER ANTIBODY, IGG: Varicella zoster IgG: 1140 {index}

## 2016-03-20 LAB — RUBELLA SCREEN: Rubella Antibodies, IGG: 3.38 {index}

## 2016-03-20 LAB — HEPATITIS B SURFACE ANTIGEN: Hepatitis B Surface Ag: NEGATIVE

## 2016-03-20 LAB — ANTIBODY SCREEN: Antibody Screen: NEGATIVE

## 2016-03-20 LAB — SYPHILIS: RPR W/REFLEX TO RPR TITER AND TREPONEMAL ANTIBODIES, TRADITIONAL SCREENING AND DIAGNOSIS ALGORITHM: RPR Ser Ql: NONREACTIVE

## 2016-03-21 LAB — CULTURE, OB URINE

## 2016-03-21 LAB — GC/CHLAMYDIA PROBE AMP
Chlamydia trachomatis, NAA: NEGATIVE
Neisseria gonorrhoeae by PCR: NEGATIVE

## 2016-03-21 LAB — URINE CULTURE, OB REFLEX: Organism ID, Bacteria: NO GROWTH

## 2016-03-27 ENCOUNTER — Encounter: Payer: Self-pay | Admitting: Obstetrics and Gynecology

## 2016-03-27 DIAGNOSIS — R825 Elevated urine levels of drugs, medicaments and biological substances: Secondary | ICD-10-CM | POA: Insufficient documentation

## 2016-03-27 LAB — URINALYSIS, ROUTINE W REFLEX MICROSCOPIC
Bilirubin, UA: NEGATIVE
Glucose, UA: NEGATIVE
Ketones, UA: NEGATIVE
Leukocytes, UA: NEGATIVE
Nitrite, UA: NEGATIVE
Protein, UA: NEGATIVE
RBC, UA: NEGATIVE
Specific Gravity, UA: 1.013 (ref 1.005–1.030)
Urobilinogen, Ur: 0.2 mg/dL (ref 0.2–1.0)
pH, UA: 7.5 (ref 5.0–7.5)

## 2016-03-27 LAB — MONITOR DRUG PROFILE 14(MW)
Amphetamine Scrn, Ur: NEGATIVE ng/mL
BARBITURATE SCREEN URINE: NEGATIVE ng/mL
BENZODIAZEPINE SCREEN, URINE: NEGATIVE ng/mL
Buprenorphine, Urine: NEGATIVE ng/mL
Cocaine (Metab) Scrn, Ur: NEGATIVE ng/mL
Creatinine(Crt), U: 55.1 mg/dL (ref 20.0–300.0)
Fentanyl, Urine: NEGATIVE pg/mL
Meperidine Screen, Urine: NEGATIVE ng/mL
Methadone Screen, Urine: NEGATIVE ng/mL
OXYCODONE+OXYMORPHONE UR QL SCN: NEGATIVE ng/mL
Opiate Scrn, Ur: NEGATIVE ng/mL
Ph of Urine: 7.2 (ref 4.5–8.9)
Phencyclidine Qn, Ur: NEGATIVE ng/mL
Propoxyphene Scrn, Ur: NEGATIVE ng/mL
SPECIFIC GRAVITY: 1.012
Tramadol Screen, Urine: NEGATIVE ng/mL

## 2016-03-27 LAB — NICOTINE SCREEN, URINE: Cotinine Ql Scrn, Ur: POSITIVE ng/mL

## 2016-03-27 LAB — CANNABINOID (GC/MS), URINE
Cannabinoid: POSITIVE — AB
Carboxy THC (GC/MS): 168 ng/mL

## 2016-04-02 ENCOUNTER — Ambulatory Visit: Payer: Medicaid Other | Admitting: Obstetrics and Gynecology

## 2016-04-02 VITALS — BP 95/60 | HR 82 | Wt 135.4 lb

## 2016-04-02 DIAGNOSIS — Z3682 Encounter for antenatal screening for nuchal translucency: Secondary | ICD-10-CM

## 2016-04-02 DIAGNOSIS — Z3490 Encounter for supervision of normal pregnancy, unspecified, unspecified trimester: Secondary | ICD-10-CM

## 2016-04-02 NOTE — Progress Notes (Signed)
Isabel [name] presents for NOB nurse interview visit. G4  P1. Pregnancy education material explained and given. No cats in the home. NOB labs ordered already performed on 03/19/2016. Drug screen POSITIVE for marijuana. PNV encouraged, pt currently taking Concept DHA. NT ordered. Pt states she is down to 5 cigarettes per day. Pt. To follow up with provider in 3 weeks for NOB physical.  All questions answered.

## 2016-04-02 NOTE — Patient Instructions (Signed)
Follow up as scheduled.  

## 2016-04-07 ENCOUNTER — Telehealth: Payer: Self-pay | Admitting: Obstetrics and Gynecology

## 2016-04-07 NOTE — Telephone Encounter (Signed)
PT IS [redacted] WKS PREGNANT AND HER RIGHT SHOULDER IS RED AND IS TINGLES AND NEEDLE FEELING AND BOTH CHEEKS ON HER FACE SAME THINGS. SHE SAID SHE FREAKING OUT B/C SHE THINKS IT MAY BE SHINGLES.

## 2016-04-07 NOTE — Telephone Encounter (Signed)
Pt states that her "boss" at work told her she may have shingles and pt was afraid she may not be able to return to work. Pt called at 2:08pm but felt like she needed to be seen. Pt has no rash of shoulder or neck, no itching or severe pain. Does c/o tingling (needle feeling) and both checks on her face. Also mentioned lower back of head tingling. I informed pt at this time it did not look like a rash of any type and that possibly be allergies or hormonal. Pt advised that she could take Benadryl 25mg  po as needed, Tylenol as needed, or possibly some Claritin or Zyrtec. Pt states she does not take any medication while she is pregnant.  Pt appreciative and given note for her employer at [employer].

## 2016-04-21 ENCOUNTER — Emergency Department: Payer: Medicaid Other

## 2016-04-21 ENCOUNTER — Emergency Department
Admission: EM | Admit: 2016-04-21 | Discharge: 2016-04-21 | Disposition: A | Discharge status: Home / Self Care | Payer: Medicaid Other | Attending: Emergency Medicine | Admitting: Emergency Medicine

## 2016-04-21 ENCOUNTER — Ambulatory Visit (INDEPENDENT_AMBULATORY_CARE_PROVIDER_SITE_OTHER): Payer: Medicaid Other

## 2016-04-21 ENCOUNTER — Ambulatory Visit (INDEPENDENT_AMBULATORY_CARE_PROVIDER_SITE_OTHER): Payer: Medicaid Other | Admitting: Obstetrics and Gynecology

## 2016-04-21 ENCOUNTER — Encounter: Payer: Self-pay | Admitting: Obstetrics and Gynecology

## 2016-04-21 ENCOUNTER — Encounter: Payer: Self-pay | Admitting: Emergency Medicine

## 2016-04-21 ENCOUNTER — Other Ambulatory Visit: Payer: Self-pay | Admitting: Obstetrics and Gynecology

## 2016-04-21 VITALS — BP 90/59 | HR 91 | Wt 136.7 lb

## 2016-04-21 DIAGNOSIS — O208 Other hemorrhage in early pregnancy: Secondary | ICD-10-CM | POA: Diagnosis not present

## 2016-04-21 DIAGNOSIS — Z3A12 12 weeks gestation of pregnancy: Secondary | ICD-10-CM | POA: Insufficient documentation

## 2016-04-21 DIAGNOSIS — R825 Elevated urine levels of drugs, medicaments and biological substances: Secondary | ICD-10-CM

## 2016-04-21 DIAGNOSIS — Z3682 Encounter for antenatal screening for nuchal translucency: Secondary | ICD-10-CM

## 2016-04-21 DIAGNOSIS — Z124 Encounter for screening for malignant neoplasm of cervix: Secondary | ICD-10-CM

## 2016-04-21 DIAGNOSIS — Z3491 Encounter for supervision of normal pregnancy, unspecified, first trimester: Secondary | ICD-10-CM

## 2016-04-21 DIAGNOSIS — Z36 Encounter for antenatal screening of mother: Secondary | ICD-10-CM

## 2016-04-21 DIAGNOSIS — F17210 Nicotine dependence, cigarettes, uncomplicated: Secondary | ICD-10-CM | POA: Insufficient documentation

## 2016-04-21 DIAGNOSIS — Z720 Tobacco use: Secondary | ICD-10-CM

## 2016-04-21 DIAGNOSIS — Z331 Pregnant state, incidental: Secondary | ICD-10-CM

## 2016-04-21 DIAGNOSIS — O09291 Supervision of pregnancy with other poor reproductive or obstetric history, first trimester: Secondary | ICD-10-CM

## 2016-04-21 DIAGNOSIS — F1290 Cannabis use, unspecified, uncomplicated: Secondary | ICD-10-CM | POA: Insufficient documentation

## 2016-04-21 DIAGNOSIS — Z3481 Encounter for supervision of other normal pregnancy, first trimester: Secondary | ICD-10-CM

## 2016-04-21 DIAGNOSIS — O09299 Supervision of pregnancy with other poor reproductive or obstetric history, unspecified trimester: Secondary | ICD-10-CM | POA: Insufficient documentation

## 2016-04-21 DIAGNOSIS — Z3490 Encounter for supervision of normal pregnancy, unspecified, unspecified trimester: Secondary | ICD-10-CM

## 2016-04-21 LAB — POCT URINALYSIS DIPSTICK
Bilirubin, UA: NEGATIVE
Glucose, UA: NEGATIVE
Ketones, UA: NEGATIVE
Leukocytes, UA: NEGATIVE
Nitrite, UA: NEGATIVE
Protein, UA: NEGATIVE
Spec Grav, UA: 1.015
Urobilinogen, UA: NEGATIVE
pH, UA: 6.5

## 2016-04-21 LAB — CBC
HCT: 41.5 % (ref 35.0–47.0)
Hemoglobin: 14.7 g/dL (ref 12.0–16.0)
MCH: 32.2 pg (ref 26.0–34.0)
MCHC: 35.4 g/dL (ref 32.0–36.0)
MCV: 90.9 fL (ref 80.0–100.0)
Platelets: 235 10*3/uL (ref 150–440)
RBC: 4.57 MIL/uL (ref 3.80–5.20)
RDW: 13.4 % (ref 11.5–14.5)
WBC: 14.8 10*3/uL — ABNORMAL HIGH (ref 3.6–11.0)

## 2016-04-21 LAB — HCG, QUANTITATIVE, PREGNANCY: hCG, Beta Chain, Quant, S: 102273 m[IU]/mL — ABNORMAL HIGH (ref <5)

## 2016-04-21 NOTE — ED Notes (Signed)
Patient transported to US 

## 2016-04-21 NOTE — ED Notes (Addendum)
Patient ambulatory to triage with steady gait, without difficulty or distress noted; pt reports vag bleeding, [redacted]wks pregnant; seen at [facility] today with normal u/s and urinalysis--results in computer; pt A+ blood type; denies abd pain; G3, 2 previous miscarriages

## 2016-04-21 NOTE — Progress Notes (Signed)
OBSTETRIC INITIAL PRENATAL VISIT  Subjective:    Isabel [name] is being seen today for her first obstetrical visit.  This is not a planned pregnancy. She is a 25 y.o. [id number] female at [redacted]w[redacted]d gestation, Estimated Date of Delivery: 11/04/16 with Patient's last menstrual period was 01/23/2016 (approximate). (consistent with 7 week sono). 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 Relationship with FOB: significant other, living together. Patient does intend to breast feed. Pregnancy history fully reviewed.    Obstetric History   G4   P1   T1   P0   A2   TAB0   SAB2   E0   M0   L1     # Outcome Date GA Lbr Len/2nd Weight Sex Delivery Anes PTL Lv  4 Current           3 SAB 2016 [redacted]w[redacted]d         2 SAB 2016 [redacted]w[redacted]d         1 Term 2011 [redacted]w[redacted]d  7 lb 1.8 oz (3.225 kg) F Vag-Spont EPI  Y      Gynecologic History:  Last pap smear was ~ 5 years ago.  Results were normal.  Denies/ h/o abnormal pap smaers in the past.  Denies/admits history of STIs.  Contraception: condoms   Past Medical History  Diagnosis Date  . Migraines   . Dysmenorrhea     Family History  Problem Relation Age of Onset  . Arthritis    . Diabetes    . Cancer    . Breast cancer Mother   . Ovarian cancer Mother   . Diabetes Maternal Grandmother   . Diabetes Maternal Grandfather   . Diabetes Paternal Grandmother   . Diabetes Paternal Grandfather   . Colon cancer Neg Hx   . Heart disease Neg Hx     Past Surgical History  Procedure Laterality Date  . Cholecystectomy      Social History   Social History  . Marital Status: Single    Spouse Name: N/A  . Number of Children: N/A  . Years of Education: 12   Occupational History  . Not on file.   Social History Main Topics  . Smoking status: Current Every Day Smoker -- 0.50 packs/day    Types: Cigarettes  . Smokeless tobacco: Not on file  . Alcohol Use: No  . Drug Use: Yes    Special: Marijuana     Comment: last smoked  - 01/23/2016- helps with her cramps- smokes weekly  . Sexual Activity: Yes     Comment: Pregnant    Other Topics Concern  . Not on file   Social History Narrative    Current Outpatient Prescriptions on File Prior to Visit  Medication Sig Dispense Refill  . Prenat-FeFum-FePo-FA-Omega 3 (CONCEPT DHA) 53.5-38-1 MG CAPS Take 53.5 mg by mouth daily. 30 capsule 11   No current facility-administered medications on file prior to visit.    Allergies  Allergen Reactions  . Amoxicillin Hives  . Penicillins Hives    Review of Systems General:Present - fatigue. Not Present- Fever, Weight Loss and Weight Gain. Skin:Not Present- Rash. HEENT:Not Present- Blurred Vision, Headache and Bleeding Gums. Respiratory:Not Present- Difficulty Breathing. Breast:Not Present- Breast Mass. Cardiovascular:Not Present- Chest Pain, Elevated Blood Pressure, Fainting / Blacking Out and Shortness of Breath. Gastrointestinal:Not Present- Abdominal Pain, Constipation, Nausea and Vomiting. Female Genitourinary:Not Present- Frequency, Painful Urination, Pelvic Pain, Vaginal Bleeding, Vaginal Discharge, Contractions, regular, Fetal Movements Decreased, Urinary Complaints and Vaginal  Fluid. Musculoskeletal:Not Present- Back Pain and Leg Cramps. Neurological:Not Present- Dizziness. Psychiatric:Not Present- Depression.     Objective:   Blood pressure 90/59, pulse 91, weight 136 lb 11.2 oz (62.007 kg), last menstrual period 01/23/2016.  Body mass index is 23.45 kg/(m^2).  General Appearance:    Alert, cooperative, no distress, appears stated age  Head:    Normocephalic, without obvious abnormality, atraumatic  Eyes:    PERRL, conjunctiva/corneas clear, EOM's intact, both eyes  Ears:    Normal external ear canals, both ears  Nose:   Nares normal, septum midline, mucosa normal, no drainage or sinus tenderness  Throat:   Lips, mucosa, and tongue normal; teeth and gums normal  Neck:   Supple, symmetrical,  trachea midline, no adenopathy; thyroid: no enlargement/tenderness/nodules; no carotid bruit or JVD  Back:     Symmetric, no curvature, ROM normal, no CVA tenderness  Lungs:     Clear to auscultation bilaterally, respirations unlabored  Chest Wall:    No tenderness or deformity   Heart:    Regular rate and rhythm, S1 and S2 normal, no murmur, rub or gallop  Breast Exam:    No tenderness, masses, or nipple abnormality  Abdomen:     Soft, non-tender, bowel sounds active all four quadrants, no masses, no organomegaly. FHT 165  bpm.  Genitalia:    Pelvic:external genitalia normal, vagina without lesions, discharge, or tenderness, rectovaginal septum  normal. Cervix normal in appearance, no cervical motion tenderness, no adnexal masses or tenderness.  Pregnancy positive findings: uterine enlargement: 12 wk size, nontender.   Rectal:    Normal external sphincter.  No hemorrhoids appreciated. Internal exam not done.   Extremities:   Extremities normal, atraumatic, no cyanosis or edema  Pulses:   2+ and symmetric all extremities  Skin:   Skin color, texture, turgor normal, no rashes or lesions  Lymph nodes:   Cervical, supraclavicular, and axillary nodes normal  Neurologic:   CNII-XII intact, normal strength, sensation and reflexes throughout     Assessment:    Pregnancy at 11 and 6/7 weeks   Tobacco abuse  Marijuana use, in remission H/o miscarriages  Plan:    Initial labs reviewed. Pap smear performed today.  Prenatal vitamins encouraged. Problem list reviewed and updated. Tobacco abuse - patient notes that she has cut down to 3 cig per day.  Is continuing to work towards cessation.  Discussed risks of tobacco use in pregnancy.  Marijuana use, in remission.  Notes no further use after last visit.  New OB counseling:  The patient has been given an overview regarding routine prenatal care.  Recommendations regarding diet, weight gain, and exercise in pregnancy were given. Prenatal testing,  optional genetic testing, and ultrasound use in pregnancy were reviewed.  AFP3 discussed: ordered. Benefits of Breast Feeding were discussed. The patient is encouraged to consider nursing her baby post partum. Follow up in 4 weeks.  50% of 30 min visit spent on counseling and coordination of care.   Pregnancy Medicaid form completed today.     [name], MD [facility]

## 2016-04-21 NOTE — ED Provider Notes (Signed)
Time Seen: Approximately 2100  I have reviewed the triage notes  Chief Complaint: Vaginal Bleeding   History of Present Illness: Isabel [name] is a 25 y.o. female who is gravida 4 para 1 currently [redacted] weeks pregnant. Patient was seen and evaluated by her OB/GYN earlier today and had an ultrasound which she states was normal. Patient states she went home and this afternoon had vaginal bleeding of approximately 1 full pad. She denies any large clots or fetal products, etc. She denies any significant abdominal pain.   Past Medical History  Diagnosis Date  . Migraines   . Dysmenorrhea     Patient Active Problem List   Diagnosis Date Noted  . History of miscarriage, currently pregnant 04/21/2016  . Tobacco abuse 04/21/2016  . Positive urine drug screen 03/27/2016  . Tobacco user 02/26/2016  . Irregular periods/menstrual cycles 02/26/2016  . Neck pain 02/22/2012  . DIARRHEA 10/25/2008  . ABDOMINAL PAIN, EPIGASTRIC 09/25/2008  . LOW BACK PAIN 08/28/2008  . VASOVAGAL SYNCOPE 08/28/2008    Past Surgical History  Procedure Laterality Date  . Cholecystectomy      Past Surgical History  Procedure Laterality Date  . Cholecystectomy      Current Outpatient Rx  Name  Route  Sig  Dispense  Refill  . Prenat-FeFum-FePo-FA-Omega 3 (CONCEPT DHA) 53.5-38-1 MG CAPS   Oral   Take 53.5 mg by mouth daily.   30 capsule   11     Allergies:  Amoxicillin and Penicillins  Family History: Family History  Problem Relation Age of Onset  . Arthritis    . Diabetes    . Cancer    . Breast cancer Mother   . Ovarian cancer Mother   . Diabetes Maternal Grandmother   . Diabetes Maternal Grandfather   . Diabetes Paternal Grandmother   . Diabetes Paternal Grandfather   . Colon cancer Neg Hx   . Heart disease Neg Hx     Social History: Social History  Substance Use Topics  . Smoking status: Current Every Day Smoker -- 0.50 packs/day    Types: Cigarettes  . Smokeless tobacco:  None  . Alcohol Use: No     Review of Systems:   10 point review of systems was performed and was otherwise negative:  Constitutional: No fever Eyes: No visual disturbances ENT: No sore throat, ear pain Cardiac: No chest pain Respiratory: No shortness of breath, wheezing, or stridor Abdomen: No abdominal pain, no vomiting, No diarrhea Endocrine: No weight loss, No night sweats Extremities: No peripheral edema, cyanosis Skin: No rashes, easy bruising Neurologic: No focal weakness, trouble with speech or swollowing Urologic: No dysuria, Hematuria, or urinary frequency   Physical Exam:  ED Triage Vitals  Enc Vitals Group     BP 04/21/16 2006 137/75 mmHg     Pulse Rate 04/21/16 2006 105     Resp 04/21/16 2006 18     Temp 04/21/16 2006 97.8 F (36.6 C)     Temp Source 04/21/16 2006 Oral     SpO2 04/21/16 2006 100 %     Weight 04/21/16 2006 139 lb (63.05 kg)     Height 04/21/16 2006 5\' 4"  (1.626 m)     Head Cir --      Peak Flow --      Pain Score --      Pain Loc --      Pain Edu? --      Excl. in [place]? --  General: Awake , Alert , and Oriented times 3; GCS 15 Head: Normal cephalic , atraumatic Eyes: Pupils equal , round, reactive to light Nose/Throat: No nasal drainage, patent upper airway without erythema or exudate.  Neck: Supple, Full range of motion, No anterior adenopathy or palpable thyroid masses Lungs: Clear to ascultation without wheezes , rhonchi, or rales Heart: Regular rate, regular rhythm without murmurs , gallops , or rubs Abdomen: Soft, non tender without rebound, guarding , or rigidity; bowel sounds positive and symmetric in all 4 quadrants. No organomegaly .        Extremities: 2 plus symmetric pulses. No edema, clubbing or cyanosis Neurologic: normal ambulation, Motor symmetric without deficits, sensory intact Skin: warm, dry, no rashes  Pelvic exam was deferred for ultrasound evaluation Labs:   All laboratory work was reviewed including any  pertinent negatives or positives listed below:  Labs Reviewed  HCG, QUANTITATIVE, PREGNANCY - Abnormal; Notable for the following:    hCG, Beta Chain, Laqueta Carina [id number] (*)    All other components within normal limits  CBC - Abnormal; Notable for the following:    WBC 14.8 (*)    All other components within normal limits    Radiology    US OB Comp Less 14 Wks (Final result) Result time: 04/21/16 22:25:02   Final result by Rad Results In Interface (04/21/16 22:25:02)   Narrative:   CLINICAL DATA: Acute onset of vaginal bleeding. Initial encounter.  EXAM: OBSTETRIC <14 WK [place] AND TRANSVAGINAL OB US  TECHNIQUE: Both transabdominal and transvaginal ultrasound examinations were performed for complete evaluation of the gestation as well as the maternal uterus, adnexal regions, and pelvic cul-de-sac. Transvaginal technique was performed to assess early pregnancy.  COMPARISON: CT of the abdomen and pelvis from 12/02/2014  FINDINGS: Intrauterine gestational sac: Single; visualized and normal in shape.  Yolk sac: No  Embryo: Yes  Cardiac Activity: Yes  Heart Rate: 163 bpm  CRL: 4.91 cm  11 w  5 d         [place] EDC: 11/05/2016  Subchorionic hemorrhage: A moderate amount of subchorionic hemorrhage is noted.  Maternal uterus/adnexae: The uterus is otherwise unremarkable.  The right ovary is unremarkable in appearance, measuring 3.2 x 1.8 x 1.8 cm. The left ovary is not visualized on this study. No suspicious adnexal masses are seen.  No free fluid is seen within the pelvic cul-de-sac.  IMPRESSION: 1. Single live intrauterine pregnancy noted, with a crown-rump length of 4.9 cm, corresponding to a gestational age of [redacted] weeks 5 days. This matches the gestational age of [redacted] weeks 5 days by LMP, reflecting an estimated date of delivery of October 29, 2016. 2. Moderate amount of subchorionic hemorrhage noted.   Electronically Signed By: Garald Balding  M.D. On: 04/21/2016 22:25          US OB Transvaginal (Final result) Result time: 04/21/16 22:25:02   Final result by Rad Results In Interface (04/21/16 22:25:02)   Narrative:   CLINICAL DATA: Acute onset of vaginal bleeding. Initial encounter.  EXAM: OBSTETRIC <14 WK [place] AND TRANSVAGINAL OB US  TECHNIQUE: Both transabdominal and transvaginal ultrasound examinations were performed for complete evaluation of the gestation as well as the maternal uterus, adnexal regions, and pelvic cul-de-sac. Transvaginal technique was performed to assess early pregnancy.  COMPARISON: CT of the abdomen and pelvis from 12/02/2014  FINDINGS: Intrauterine gestational sac: Single; visualized and normal in shape.  Yolk sac: No  Embryo: Yes  Cardiac Activity: Yes  Heart Rate: 163  bpm  CRL: 4.91 cm  11 w  5 d         [place] EDC: 11/05/2016  Subchorionic hemorrhage: A moderate amount of subchorionic hemorrhage is noted.  Maternal uterus/adnexae: The uterus is otherwise unremarkable.  The right ovary is unremarkable in appearance, measuring 3.2 x 1.8 x 1.8 cm. The left ovary is not visualized on this study. No suspicious adnexal masses are seen.  No free fluid is seen within the pelvic cul-de-sac.  IMPRESSION: 1. Single live intrauterine pregnancy noted, with a crown-rump length of 4.9 cm, corresponding to a gestational age of [redacted] weeks 5 days. This matches the gestational age of [redacted] weeks 5 days by LMP, reflecting an estimated date of delivery of October 29, 2016. 2. Moderate amount of subchorionic hemorrhage noted.   Electronically Signed By: Garald Balding M.D. On: 04/21/2016 22:25            I personally reviewed the radiologic studies   ED Course:  Patient's stay here was uneventful and appears that she has been proceeding with a normal pregnancy and her bleeding was small. The patient by history and by exam here does not have an ectopic  pregnancy and no evidence of a miscarriage at this time. Patient's case was reviewed with Dr. Marcelline Mates who is on call for OB/GYN andthe patient well. Patient be followed up on an outpatient basis. She was advised drink plenty of fluids and take Tylenol for pain.  Assessment:  First trimester vaginal bleeding Normal interuterine pregnancy      Plan: * Outpatient management          Daymon Larsen, MD 04/21/16 [phone]

## 2016-04-21 NOTE — Discharge Instructions (Signed)
First Trimester of Pregnancy  The first trimester of pregnancy is from week 1 until the end of week 12 (months 1 through 3). A week after a sperm fertilizes an egg, the egg will implant on the wall of the uterus. This embryo will begin to develop into a baby. Genes from you and your partner are forming the baby. The female genes determine whether the baby is a boy or a girl. At 6-8 weeks, the eyes and face are formed, and the heartbeat can be seen on ultrasound. At the end of 12 weeks, all the baby's organs are formed.   Now that you are pregnant, you will want to do everything you can to have a healthy baby. Two of the most important things are to get good prenatal care and to follow your health care provider's instructions. Prenatal care is all the medical care you receive before the baby's birth. This care will help prevent, find, and treat any problems during the pregnancy and childbirth.  BODY CHANGES  Your body goes through many changes during pregnancy. The changes vary from woman to woman.   · You may gain or lose a couple of pounds at first.  · You may feel sick to your stomach (nauseous) and throw up (vomit). If the vomiting is uncontrollable, call your health care provider.  · You may tire easily.  · You may develop headaches that can be relieved by medicines approved by your health care provider.  · You may urinate more often. Painful urination may mean you have a bladder infection.  · You may develop heartburn as a result of your pregnancy.  · You may develop constipation because certain hormones are causing the muscles that push waste through your intestines to slow down.  · You may develop hemorrhoids or swollen, bulging veins (varicose veins).  · Your breasts may begin to grow larger and become tender. Your nipples may stick out more, and the tissue that surrounds them (areola) may become darker.  · Your gums may bleed and may be sensitive to brushing and flossing.   · Dark spots or blotches (chloasma, mask of pregnancy) may develop on your face. This will likely fade after the baby is born.  · Your menstrual periods will stop.  · You may have a loss of appetite.  · You may develop cravings for certain kinds of food.  · You may have changes in your emotions from day to day, such as being excited to be pregnant or being concerned that something may go wrong with the pregnancy and baby.  · You may have more vivid and strange dreams.  · You may have changes in your hair. These can include thickening of your hair, rapid growth, and changes in texture. Some women also have hair loss during or after pregnancy, or hair that feels dry or thin. Your hair will most likely return to normal after your baby is born.  WHAT TO EXPECT AT YOUR PRENATAL VISITS  During a routine prenatal visit:  · You will be weighed to make sure you and the baby are growing normally.  · Your blood pressure will be taken.  · Your abdomen will be measured to track your baby's growth.  · The fetal heartbeat will be listened to starting around week 10 or 12 of your pregnancy.  · Test results from any previous visits will be discussed.  Your health care provider may ask you:  · How you are feeling.  · If you   including cigarettes, chewing tobacco, and electronic cigarettes.  If you have any questions. Other tests that may be performed during your first trimester include:  Blood tests to find your blood type and to check for the presence of any previous infections. They will also be used to check for low iron levels (anemia) and Rh antibodies. Later in the pregnancy, blood tests for diabetes will be done along with other tests if problems develop.  Urine tests to check for infections, diabetes, or protein in the urine.  An ultrasound to confirm the proper growth  and development of the baby.  An amniocentesis to check for possible genetic problems.  Fetal screens for spina bifida and Down syndrome.  You may need other tests to make sure you and the baby are doing well.  HIV (human immunodeficiency virus) testing. Routine prenatal testing includes screening for HIV, unless you choose not to have this test. HOME CARE INSTRUCTIONS  Medicines  Follow your health care provider's instructions regarding medicine use. Specific medicines may be either safe or unsafe to take during pregnancy.  Take your prenatal vitamins as directed.  If you develop constipation, try taking a stool softener if your health care provider approves. Diet  Eat regular, well-balanced meals. Choose a variety of foods, such as meat or vegetable-based protein, fish, milk and low-fat dairy products, vegetables, fruits, and whole grain breads and cereals. Your health care provider will help you determine the amount of weight gain that is right for you.  Avoid raw meat and uncooked cheese. These carry germs that can cause birth defects in the baby.  Eating four or five small meals rather than three large meals a day may help relieve nausea and vomiting. If you start to feel nauseous, eating a few soda crackers can be helpful. Drinking liquids between meals instead of during meals also seems to help nausea and vomiting.  If you develop constipation, eat more high-fiber foods, such as fresh vegetables or fruit and whole grains. Drink enough fluids to keep your urine clear or pale yellow. Activity and Exercise  Exercise only as directed by your health care provider. Exercising will help you:  Control your weight.  Stay in shape.  Be prepared for labor and delivery.  Experiencing pain or cramping in the lower abdomen or low back is a good sign that you should stop exercising. Check with your health care provider before continuing normal exercises.  Try to avoid standing for long  periods of time. Move your legs often if you must stand in one place for a long time.  Avoid heavy lifting.  Wear low-heeled shoes, and practice good posture.  You may continue to have sex unless your health care provider directs you otherwise. Relief of Pain or Discomfort  Wear a good support bra for breast tenderness.   Take warm sitz baths to soothe any pain or discomfort caused by hemorrhoids. Use hemorrhoid cream if your health care provider approves.   Rest with your legs elevated if you have leg cramps or low back pain.  If you develop varicose veins in your legs, wear support hose. Elevate your feet for 15 minutes, 3-4 times a day. Limit salt in your diet. Prenatal Care  Schedule your prenatal visits by the twelfth week of pregnancy. They are usually scheduled monthly at first, then more often in the last 2 months before delivery.  Write down your questions. Take them to your prenatal visits.  Keep all your prenatal visits as directed by your  health care provider. Safety  Wear your seat belt at all times when driving.  Make a list of emergency phone numbers, including numbers for family, friends, the hospital, and police and fire departments. General Tips  Ask your health care provider for a referral to a local prenatal education class. Begin classes no later than at the beginning of month 6 of your pregnancy.  Ask for help if you have counseling or nutritional needs during pregnancy. Your health care provider can offer advice or refer you to specialists for help with various needs.  Do not use hot tubs, steam rooms, or saunas.  Do not douche or use tampons or scented sanitary pads.  Do not cross your legs for long periods of time.  Avoid cat litter boxes and soil used by cats. These carry germs that can cause birth defects in the baby and possibly loss of the fetus by miscarriage or stillbirth.  Avoid all smoking, herbs, alcohol, and medicines not prescribed by  your health care provider. Chemicals in these affect the formation and growth of the baby.  Do not use any tobacco products, including cigarettes, chewing tobacco, and electronic cigarettes. If you need help quitting, ask your health care provider. You may receive counseling support and other resources to help you quit.  Schedule a dentist appointment. At home, brush your teeth with a soft toothbrush and be gentle when you floss. SEEK MEDICAL CARE IF:   You have dizziness.  You have mild pelvic cramps, pelvic pressure, or nagging pain in the abdominal area.  You have persistent nausea, vomiting, or diarrhea.  You have a bad smelling vaginal discharge.  You have pain with urination.  You notice increased swelling in your face, hands, legs, or ankles. SEEK IMMEDIATE MEDICAL CARE IF:   You have a fever.  You are leaking fluid from your vagina.  You have spotting or bleeding from your vagina.  You have severe abdominal cramping or pain.  You have rapid weight gain or loss.  You vomit blood or material that looks like coffee grounds.  You are exposed to [place] measles and have never had them.  You are exposed to fifth disease or chickenpox.  You develop a severe headache.  You have shortness of breath.  You have any kind of trauma, such as from a fall or a car accident.   This information is not intended to replace advice given to you by your health care provider. Make sure you discuss any questions you have with your health care provider.   Document Released: 10/20/2001 Document Revised: 11/16/2014 Document Reviewed: 09/05/2013 Elsevier Interactive Patient Education [employer].  Please return immediately if condition worsens. Please contact her primary physician or the physician you were given for referral. If you have any specialist physicians involved in her treatment and plan please also contact them. Thank you for using [facility]. Tylenol for pain

## 2016-04-23 ENCOUNTER — Ambulatory Visit (INDEPENDENT_AMBULATORY_CARE_PROVIDER_SITE_OTHER): Payer: Medicaid Other | Admitting: Obstetrics and Gynecology

## 2016-04-23 VITALS — BP 89/60 | HR 89 | Wt 137.0 lb

## 2016-04-23 DIAGNOSIS — O09292 Supervision of pregnancy with other poor reproductive or obstetric history, second trimester: Secondary | ICD-10-CM

## 2016-04-23 LAB — POCT URINALYSIS DIPSTICK
Bilirubin, UA: NEGATIVE
Glucose, UA: NEGATIVE
Ketones, UA: NEGATIVE
Leukocytes, UA: NEGATIVE
Nitrite, UA: NEGATIVE
Protein, UA: NEGATIVE
Spec Grav, UA: 1.01
Urobilinogen, UA: NEGATIVE
pH, UA: 7

## 2016-04-23 NOTE — Patient Instructions (Signed)
Subchorionic Hematoma °A subchorionic hematoma is a gathering of blood between the outer wall of the placenta and the inner wall of the womb (uterus). The placenta is the organ that connects the fetus to the wall of the uterus. The placenta performs the feeding, breathing (oxygen to the fetus), and waste removal (excretory work) of the fetus.  °Subchorionic hematoma is the most common abnormality found on a result from ultrasonography done during the first trimester or early second trimester of pregnancy. If there has been little or no vaginal bleeding, early small hematomas usually shrink on their own and do not affect your baby or pregnancy. The blood is gradually absorbed over 1-2 weeks. When bleeding starts later in pregnancy or the hematoma is larger or occurs in an older pregnant woman, the outcome may not be as good. Larger hematomas may get bigger, which increases the chances for miscarriage. Subchorionic hematoma also increases the risk of premature detachment of the placenta from the uterus, preterm (premature) labor, and stillbirth. °HOME CARE INSTRUCTIONS °· Stay on bed rest if your health care provider recommends this. Although bed rest will not prevent more bleeding or prevent a miscarriage, your health care provider may recommend bed rest until you are advised otherwise. °· Avoid heavy lifting (more than 10 lb [4.5 kg]), exercise, sexual intercourse, or douching as directed by your health care provider. °· Keep track of the number of pads you use each day and how soaked (saturated) they are. Write down this information. °· Do not use tampons. °· Keep all follow-up appointments as directed by your health care provider. Your health care provider may ask you to have follow-up blood tests or ultrasound tests or both. °SEEK IMMEDIATE MEDICAL CARE IF: °· You have severe cramps in your stomach, back, abdomen, or pelvis. °· You have a fever. °· You pass large clots or tissue. Save any tissue for your health  care provider to look at. °· Your bleeding increases or you become lightheaded, feel weak, or have fainting episodes. °  °This information is not intended to replace advice given to you by your health care provider. Make sure you discuss any questions you have with your health care provider. °  °Document Released: 02/10/2007 Document Revised: 11/16/2014 Document Reviewed: 05/25/2013 °Elsevier Interactive Patient Education ©2016 Elsevier Inc. ° °

## 2016-04-23 NOTE — Progress Notes (Signed)
Problem OB: Patient presents for f/u of recent ER visit for vaginal bleeding  3 days ago.  Patient reports that she felt a gush of blood while at work.  Was seen in the ER where she was diagnosed with moderate subchorionic hemorrhage noted on ultrasound. Patient denies any further bleeding since that time.  Offered patient reassurance, advised on pelvic rest until anatomy scan.  Bleeding precautions given.

## 2016-04-24 LAB — FIRST TRIMESTER SCREEN W/NT
CRL: 51.8 mm
DIA MoM: 1.42
DIA Value: 426.3 pg/mL
Gest Age-Collect: 11.9 weeks
Maternal Age At EDD: 25.2 years
Nuchal Translucency MoM: 1.15
Nuchal Translucency: 1.6 mm
Number of Fetuses: 1
PAPP-A MoM: 0.51
PAPP-A Value: 422.1 ng/mL
PDF: 0
Test Results:: NEGATIVE
Weight: 136 [lb_av]
hCG MoM: 0.96
hCG Value: 104.9 IU/mL

## 2016-04-26 LAB — PAP IG, CT-NG, RFX HPV ASCU
Chlamydia, Nuc. Acid Amp: NEGATIVE
Gonococcus by Nucleic Acid Amp: NEGATIVE
PAP Smear Comment: 0

## 2016-04-27 ENCOUNTER — Encounter: Payer: Self-pay | Admitting: Obstetrics and Gynecology

## 2016-04-30 ENCOUNTER — Encounter: Payer: Self-pay | Admitting: Obstetrics and Gynecology

## 2016-05-27 ENCOUNTER — Ambulatory Visit (INDEPENDENT_AMBULATORY_CARE_PROVIDER_SITE_OTHER): Payer: Medicaid Other | Admitting: Obstetrics and Gynecology

## 2016-05-27 ENCOUNTER — Other Ambulatory Visit: Payer: Self-pay | Admitting: Obstetrics and Gynecology

## 2016-05-27 ENCOUNTER — Encounter: Payer: Self-pay | Admitting: Obstetrics and Gynecology

## 2016-05-27 VITALS — BP 94/61 | HR 108 | Wt 141.8 lb

## 2016-05-27 DIAGNOSIS — O418X10 Other specified disorders of amniotic fluid and membranes, first trimester, not applicable or unspecified: Secondary | ICD-10-CM | POA: Insufficient documentation

## 2016-05-27 DIAGNOSIS — N926 Irregular menstruation, unspecified: Secondary | ICD-10-CM

## 2016-05-27 DIAGNOSIS — O43891 Other placental disorders, first trimester: Secondary | ICD-10-CM

## 2016-05-27 DIAGNOSIS — Z3492 Encounter for supervision of normal pregnancy, unspecified, second trimester: Secondary | ICD-10-CM

## 2016-05-27 DIAGNOSIS — O468X1 Other antepartum hemorrhage, first trimester: Secondary | ICD-10-CM

## 2016-05-27 DIAGNOSIS — Z3482 Encounter for supervision of other normal pregnancy, second trimester: Secondary | ICD-10-CM

## 2016-05-27 DIAGNOSIS — O09292 Supervision of pregnancy with other poor reproductive or obstetric history, second trimester: Secondary | ICD-10-CM

## 2016-05-27 LAB — POCT URINALYSIS DIPSTICK
Bilirubin, UA: NEGATIVE
Glucose, UA: NEGATIVE
Ketones, UA: NEGATIVE
Leukocytes, UA: NEGATIVE
Nitrite, UA: NEGATIVE
Protein, UA: NEGATIVE
Spec Grav, UA: 1.01
Urobilinogen, UA: NEGATIVE
pH, UA: 7.5

## 2016-05-27 NOTE — Progress Notes (Signed)
ROB: C/o right sciatic pain. Declines using Tylenol for pain.  Advised on using stretches as needed.  Denies further vaginal bleeding. Continue to maintain pelvic rest until ultrasound. For anatomy scan in 2-3 weeks.  Continue to encourage smoking cessation. RTC in 4 weeks.

## 2016-06-24 ENCOUNTER — Encounter: Payer: Self-pay | Admitting: Obstetrics and Gynecology

## 2016-06-24 ENCOUNTER — Other Ambulatory Visit: Payer: Medicaid Other

## 2016-06-24 ENCOUNTER — Ambulatory Visit (INDEPENDENT_AMBULATORY_CARE_PROVIDER_SITE_OTHER): Payer: Medicaid Other

## 2016-06-24 ENCOUNTER — Ambulatory Visit (INDEPENDENT_AMBULATORY_CARE_PROVIDER_SITE_OTHER): Payer: Medicaid Other | Admitting: Obstetrics and Gynecology

## 2016-06-24 VITALS — BP 95/62 | HR 90 | Wt 145.4 lb

## 2016-06-24 DIAGNOSIS — I83813 Varicose veins of bilateral lower extremities with pain: Secondary | ICD-10-CM | POA: Insufficient documentation

## 2016-06-24 DIAGNOSIS — Z3482 Encounter for supervision of other normal pregnancy, second trimester: Secondary | ICD-10-CM

## 2016-06-24 DIAGNOSIS — Z3492 Encounter for supervision of normal pregnancy, unspecified, second trimester: Secondary | ICD-10-CM

## 2016-06-24 DIAGNOSIS — Z720 Tobacco use: Secondary | ICD-10-CM

## 2016-06-24 DIAGNOSIS — I8393 Asymptomatic varicose veins of bilateral lower extremities: Secondary | ICD-10-CM

## 2016-06-24 DIAGNOSIS — I8390 Asymptomatic varicose veins of unspecified lower extremity: Secondary | ICD-10-CM

## 2016-06-24 LAB — POCT URINALYSIS DIPSTICK
Bilirubin, UA: NEGATIVE
Glucose, UA: NEGATIVE
Ketones, UA: NEGATIVE
Leukocytes, UA: NEGATIVE
Nitrite, UA: NEGATIVE
Protein, UA: NEGATIVE
Spec Grav, UA: 1.01
Urobilinogen, UA: NEGATIVE
pH, UA: 7.5

## 2016-06-24 NOTE — Progress Notes (Signed)
Patient doing well, complains of varicose veins.  Discussed conservative treatments.  Continue to encourage smoking cessation.  RTC in 4 weeks. For anatomy scan today.

## 2016-07-24 ENCOUNTER — Other Ambulatory Visit: Payer: Self-pay | Admitting: Obstetrics and Gynecology

## 2016-07-24 DIAGNOSIS — IMO0002 Reserved for concepts with insufficient information to code with codable children: Secondary | ICD-10-CM

## 2016-07-24 DIAGNOSIS — Z0489 Encounter for examination and observation for other specified reasons: Secondary | ICD-10-CM

## 2016-07-29 ENCOUNTER — Ambulatory Visit (INDEPENDENT_AMBULATORY_CARE_PROVIDER_SITE_OTHER): Payer: Medicaid Other | Admitting: Obstetrics and Gynecology

## 2016-07-29 ENCOUNTER — Ambulatory Visit (INDEPENDENT_AMBULATORY_CARE_PROVIDER_SITE_OTHER): Payer: Medicaid Other

## 2016-07-29 VITALS — BP 105/71 | HR 88 | Wt 148.0 lb

## 2016-07-29 DIAGNOSIS — Z3492 Encounter for supervision of normal pregnancy, unspecified, second trimester: Secondary | ICD-10-CM

## 2016-07-29 DIAGNOSIS — Z131 Encounter for screening for diabetes mellitus: Secondary | ICD-10-CM

## 2016-07-29 DIAGNOSIS — I8393 Asymptomatic varicose veins of bilateral lower extremities: Secondary | ICD-10-CM

## 2016-07-29 DIAGNOSIS — Z36 Encounter for antenatal screening of mother: Secondary | ICD-10-CM | POA: Diagnosis not present

## 2016-07-29 DIAGNOSIS — IMO0002 Reserved for concepts with insufficient information to code with codable children: Secondary | ICD-10-CM

## 2016-07-29 DIAGNOSIS — Z3482 Encounter for supervision of other normal pregnancy, second trimester: Secondary | ICD-10-CM

## 2016-07-29 DIAGNOSIS — I8390 Asymptomatic varicose veins of unspecified lower extremity: Secondary | ICD-10-CM

## 2016-07-29 DIAGNOSIS — Z0489 Encounter for examination and observation for other specified reasons: Secondary | ICD-10-CM

## 2016-07-29 DIAGNOSIS — Z720 Tobacco use: Secondary | ICD-10-CM

## 2016-07-29 LAB — POCT URINALYSIS DIPSTICK
Bilirubin, UA: NEGATIVE
Glucose, UA: NEGATIVE
Ketones, UA: NEGATIVE
Leukocytes, UA: NEGATIVE
Nitrite, UA: NEGATIVE
Protein, UA: NEGATIVE
Spec Grav, UA: <=1.005
Urobilinogen, UA: NEGATIVE
pH, UA: 8

## 2016-07-29 NOTE — Progress Notes (Signed)
ROB: Noting worsening varicose veins.  Continue conservative treatment.  Is down to 1 cig in morning.  Continue to encourage smoking cessation.  S/p f/u anatomy scan, normal remaining anatomy noted (heart views).  RTC in 2-3 weeks.  For 28 week labs, Tdap, and flu vaccine next visit.

## 2016-08-19 ENCOUNTER — Other Ambulatory Visit: Payer: Medicaid Other

## 2016-08-19 ENCOUNTER — Ambulatory Visit (INDEPENDENT_AMBULATORY_CARE_PROVIDER_SITE_OTHER): Payer: Medicaid Other | Admitting: Obstetrics and Gynecology

## 2016-08-19 VITALS — BP 95/56 | HR 97 | Wt 152.8 lb

## 2016-08-19 DIAGNOSIS — Z23 Encounter for immunization: Secondary | ICD-10-CM

## 2016-08-19 DIAGNOSIS — Z3482 Encounter for supervision of other normal pregnancy, second trimester: Secondary | ICD-10-CM | POA: Diagnosis not present

## 2016-08-19 DIAGNOSIS — Z131 Encounter for screening for diabetes mellitus: Secondary | ICD-10-CM

## 2016-08-19 DIAGNOSIS — O219 Vomiting of pregnancy, unspecified: Secondary | ICD-10-CM

## 2016-08-19 LAB — POCT URINALYSIS DIPSTICK
Bilirubin, UA: NEGATIVE
Glucose, UA: NEGATIVE
Ketones, UA: NEGATIVE
Nitrite, UA: NEGATIVE
Protein, UA: NEGATIVE
Spec Grav, UA: <=1.005
Urobilinogen, UA: NEGATIVE
pH, UA: 7.5

## 2016-08-19 MED ORDER — TETANUS-DIPHTH-ACELL PERTUSSIS 5-2.5-18.5 LF-MCG/0.5 IM SUSP
0.5000 mL | Freq: Once | INTRAMUSCULAR | Status: AC
  Administered 2016-08-19: 0.5 mL via INTRAMUSCULAR

## 2016-08-19 NOTE — Progress Notes (Signed)
ROB: Notes intermittent episodes of stomachaches with nausea/vomiting, feeling clammy, but then resolves after vomiting. Denies any changes in diet . Discussed use of Pepto-bismol, brat diet, ginger ale. For 28 week labs today.  Desires to breastfeed, desires vasectomy for contraception. For Tdap and flu vaccine today, signed blood consent, discussed cord blood banking. RTC in 2 weeks.

## 2016-08-20 LAB — CBC
Hematocrit: 33.2 % — ABNORMAL LOW (ref 34.0–46.6)
Hemoglobin: 11.2 g/dL (ref 11.1–15.9)
MCH: 31.5 pg (ref 26.6–33.0)
MCHC: 33.7 g/dL (ref 31.5–35.7)
MCV: 93 fL (ref 79–97)
Platelets: 229 10*3/uL (ref 150–379)
RBC: 3.56 x10E6/uL — ABNORMAL LOW (ref 3.77–5.28)
RDW: 13.9 % (ref 12.3–15.4)
WBC: 12.1 10*3/uL — ABNORMAL HIGH (ref 3.4–10.8)

## 2016-08-20 LAB — GLUCOSE, 1 HOUR GESTATIONAL: Gestational Diabetes Screen: 98 mg/dL (ref 65–139)

## 2016-09-02 ENCOUNTER — Encounter: Payer: Medicaid Other | Admitting: Obstetrics and Gynecology

## 2016-09-16 ENCOUNTER — Ambulatory Visit (INDEPENDENT_AMBULATORY_CARE_PROVIDER_SITE_OTHER): Payer: Medicaid Other | Admitting: Obstetrics and Gynecology

## 2016-09-16 VITALS — BP 100/63 | HR 112 | Wt 156.3 lb

## 2016-09-16 DIAGNOSIS — O2200 Varicose veins of lower extremity in pregnancy, unspecified trimester: Secondary | ICD-10-CM

## 2016-09-16 DIAGNOSIS — R102 Pelvic and perineal pain: Secondary | ICD-10-CM

## 2016-09-16 DIAGNOSIS — Z3483 Encounter for supervision of other normal pregnancy, third trimester: Secondary | ICD-10-CM

## 2016-09-16 LAB — POCT URINALYSIS DIPSTICK
Bilirubin, UA: NEGATIVE
Glucose, UA: NEGATIVE
Ketones, UA: NEGATIVE
Nitrite, UA: NEGATIVE
Protein, UA: NEGATIVE
Spec Grav, UA: 1.01
Urobilinogen, UA: NEGATIVE
pH, UA: 8

## 2016-09-16 NOTE — Progress Notes (Signed)
ROB: C/o bilateral leg pain, vulvar varicose veins.  Notes working 6 days per week.  Advised on more frequent breaks at work.  RTC in 2 weeks.

## 2016-10-07 ENCOUNTER — Ambulatory Visit (INDEPENDENT_AMBULATORY_CARE_PROVIDER_SITE_OTHER): Payer: Medicaid Other | Admitting: Obstetrics and Gynecology

## 2016-10-07 VITALS — BP 101/64 | HR 96 | Wt 157.0 lb

## 2016-10-07 DIAGNOSIS — O2213 Genital varices in pregnancy, third trimester: Secondary | ICD-10-CM | POA: Insufficient documentation

## 2016-10-07 DIAGNOSIS — Z113 Encounter for screening for infections with a predominantly sexual mode of transmission: Secondary | ICD-10-CM

## 2016-10-07 DIAGNOSIS — Z3685 Encounter for antenatal screening for Streptococcus B: Secondary | ICD-10-CM

## 2016-10-07 DIAGNOSIS — O322XX0 Maternal care for transverse and oblique lie, not applicable or unspecified: Secondary | ICD-10-CM

## 2016-10-07 DIAGNOSIS — Z3483 Encounter for supervision of other normal pregnancy, third trimester: Secondary | ICD-10-CM | POA: Insufficient documentation

## 2016-10-07 LAB — POCT URINALYSIS DIPSTICK
Bilirubin, UA: NEGATIVE
Blood, UA: NEGATIVE
Glucose, UA: NEGATIVE
Ketones, UA: NEGATIVE
Nitrite, UA: NEGATIVE
Protein, UA: NEGATIVE
Spec Grav, UA: 1.01
Urobilinogen, UA: NEGATIVE
pH, UA: 7

## 2016-10-07 LAB — OB RESULTS CONSOLE GBS: GBS: NEGATIVE

## 2016-10-07 NOTE — Progress Notes (Signed)
ROB: continue to note usual body aches.  36 week labs done today.  Fetus in oblique presentation, will f/u in 1 week with scan for presentation. Discussed management options if continued malpresentation (ECV, moxibustion, acupuncture, C-section).  To discuss further next visit. RTC in 1 week.

## 2016-10-10 LAB — GC/CHLAMYDIA PROBE AMP
Chlamydia trachomatis, NAA: NEGATIVE
Neisseria gonorrhoeae by PCR: NEGATIVE

## 2016-10-11 LAB — CULTURE, BETA STREP (GROUP B ONLY): Strep Gp B Culture: NEGATIVE

## 2016-10-13 ENCOUNTER — Ambulatory Visit (INDEPENDENT_AMBULATORY_CARE_PROVIDER_SITE_OTHER): Payer: Medicaid Other | Admitting: Obstetrics and Gynecology

## 2016-10-13 ENCOUNTER — Ambulatory Visit (INDEPENDENT_AMBULATORY_CARE_PROVIDER_SITE_OTHER): Payer: Medicaid Other

## 2016-10-13 VITALS — BP 100/58 | HR 94 | Wt 161.5 lb

## 2016-10-13 DIAGNOSIS — O322XX0 Maternal care for transverse and oblique lie, not applicable or unspecified: Secondary | ICD-10-CM | POA: Diagnosis not present

## 2016-10-13 DIAGNOSIS — Z3483 Encounter for supervision of other normal pregnancy, third trimester: Secondary | ICD-10-CM

## 2016-10-13 DIAGNOSIS — Z720 Tobacco use: Secondary | ICD-10-CM

## 2016-10-13 LAB — POCT URINALYSIS DIPSTICK
Bilirubin, UA: NEGATIVE
Glucose, UA: NEGATIVE
Ketones, UA: NEGATIVE
Nitrite, UA: NEGATIVE
Protein, UA: NEGATIVE
Spec Grav, UA: 1.01
Urobilinogen, UA: NEGATIVE
pH, UA: 7.5

## 2016-10-13 NOTE — Progress Notes (Signed)
ROB: Declined cervical check. Scan reveals vertex presentation.  GBS negative.  RTC in 1 week

## 2016-10-20 ENCOUNTER — Ambulatory Visit (INDEPENDENT_AMBULATORY_CARE_PROVIDER_SITE_OTHER): Payer: Medicaid Other | Admitting: Obstetrics and Gynecology

## 2016-10-20 VITALS — BP 96/67 | HR 75 | Wt 158.4 lb

## 2016-10-20 DIAGNOSIS — Z3493 Encounter for supervision of normal pregnancy, unspecified, third trimester: Secondary | ICD-10-CM

## 2016-10-20 LAB — POCT URINALYSIS DIPSTICK
Bilirubin, UA: NEGATIVE
Blood, UA: NEGATIVE
Glucose, UA: NEGATIVE
Ketones, UA: NEGATIVE
Leukocytes, UA: NEGATIVE
Nitrite, UA: NEGATIVE
Protein, UA: NEGATIVE
Spec Grav, UA: 1.01
Urobilinogen, UA: 0.2
pH, UA: 7

## 2016-10-20 NOTE — Patient Instructions (Signed)
   Place 32-42 weeks prenatal visit patient instructions here.  

## 2016-10-20 NOTE — Progress Notes (Signed)
ROb- Reports more pelvic pressure and worsening back pain since last week. Denies vaginal bleeding. Is having some Braxton Hicks contractions about 6/day. Follow up in 1 week for ROB.

## 2016-10-20 NOTE — Progress Notes (Signed)
ROB- pt is having lots of pelvic pressure, some low back pain

## 2016-10-27 ENCOUNTER — Ambulatory Visit (INDEPENDENT_AMBULATORY_CARE_PROVIDER_SITE_OTHER): Payer: Medicaid Other | Admitting: Obstetrics and Gynecology

## 2016-10-27 VITALS — BP 97/65 | HR 67 | Wt 161.1 lb

## 2016-10-27 DIAGNOSIS — Z3483 Encounter for supervision of other normal pregnancy, third trimester: Secondary | ICD-10-CM

## 2016-10-27 LAB — POCT URINALYSIS DIPSTICK
Bilirubin, UA: NEGATIVE
Blood, UA: NEGATIVE
Glucose, UA: NEGATIVE
Ketones, UA: NEGATIVE
Nitrite, UA: NEGATIVE
Protein, UA: NEGATIVE
Spec Grav, UA: <=1.005
Urobilinogen, UA: NEGATIVE
pH, UA: 7.5

## 2016-10-27 NOTE — Progress Notes (Signed)
ROB: Patient thinks she lost her mucus plug.  Reiterated labor precautions. RTC in 1 week.

## 2016-11-02 ENCOUNTER — Inpatient Hospital Stay
Admission: EM | Admit: 2016-11-02 | Discharge: 2016-11-04 | DRG: 775 | Disposition: A | Discharge status: Home / Self Care | Payer: Medicaid Other | Attending: Obstetrics and Gynecology | Admitting: Obstetrics and Gynecology

## 2016-11-02 ENCOUNTER — Observation Stay (HOSPITAL_BASED_OUTPATIENT_CLINIC_OR_DEPARTMENT_OTHER)
Admission: EM | Admit: 2016-11-02 | Discharge: 2016-11-02 | Disposition: A | Discharge status: Home / Self Care | Payer: Medicaid Other | Source: Home / Self Care | Admitting: Obstetrics and Gynecology

## 2016-11-02 DIAGNOSIS — O99334 Smoking (tobacco) complicating childbirth: Principal | ICD-10-CM | POA: Diagnosis present

## 2016-11-02 DIAGNOSIS — Z3A39 39 weeks gestation of pregnancy: Secondary | ICD-10-CM

## 2016-11-02 DIAGNOSIS — D72829 Elevated white blood cell count, unspecified: Secondary | ICD-10-CM | POA: Diagnosis present

## 2016-11-02 DIAGNOSIS — O99324 Drug use complicating childbirth: Secondary | ICD-10-CM | POA: Diagnosis present

## 2016-11-02 DIAGNOSIS — O479 False labor, unspecified: Secondary | ICD-10-CM | POA: Diagnosis present

## 2016-11-02 DIAGNOSIS — F17210 Nicotine dependence, cigarettes, uncomplicated: Secondary | ICD-10-CM | POA: Diagnosis present

## 2016-11-02 DIAGNOSIS — F1290 Cannabis use, unspecified, uncomplicated: Secondary | ICD-10-CM | POA: Diagnosis present

## 2016-11-02 DIAGNOSIS — Z3493 Encounter for supervision of normal pregnancy, unspecified, third trimester: Secondary | ICD-10-CM | POA: Diagnosis present

## 2016-11-02 LAB — TYPE AND SCREEN
ABO/RH(D): A POS
Antibody Screen: NEGATIVE

## 2016-11-02 LAB — URINALYSIS, ROUTINE W REFLEX MICROSCOPIC
Bilirubin Urine: NEGATIVE
Glucose, UA: NEGATIVE mg/dL
Hgb urine dipstick: NEGATIVE
Ketones, ur: NEGATIVE mg/dL
Nitrite: NEGATIVE
Protein, ur: NEGATIVE mg/dL
Specific Gravity, Urine: 1.01 (ref 1.005–1.030)
pH: 7 (ref 5.0–8.0)

## 2016-11-02 LAB — URINE DRUG SCREEN, QUALITATIVE (ARMC ONLY)
Amphetamines, Ur Screen: NOT DETECTED
Barbiturates, Ur Screen: NOT DETECTED
Benzodiazepine, Ur Scrn: NOT DETECTED
Cannabinoid 50 Ng, Ur ~~LOC~~: POSITIVE — AB
Cocaine Metabolite,Ur ~~LOC~~: NOT DETECTED
MDMA (Ecstasy)Ur Screen: NOT DETECTED
Methadone Scn, Ur: NOT DETECTED
Opiate, Ur Screen: NOT DETECTED
Phencyclidine (PCP) Ur S: NOT DETECTED
Tricyclic, Ur Screen: NOT DETECTED

## 2016-11-02 LAB — CBC
HCT: 38.5 % (ref 35.0–47.0)
Hemoglobin: 13.5 g/dL (ref 12.0–16.0)
MCH: 32.3 pg (ref 26.0–34.0)
MCHC: 35 g/dL (ref 32.0–36.0)
MCV: 92.2 fL (ref 80.0–100.0)
Platelets: 274 10*3/uL (ref 150–440)
RBC: 4.17 MIL/uL (ref 3.80–5.20)
RDW: 14 % (ref 11.5–14.5)
WBC: 23.2 10*3/uL — ABNORMAL HIGH (ref 3.6–11.0)

## 2016-11-02 MED ORDER — OXYCODONE-ACETAMINOPHEN 5-325 MG PO TABS: 1.0000 | ORAL_TABLET | ORAL | Status: DC | PRN

## 2016-11-02 MED ORDER — TETANUS-DIPHTH-ACELL PERTUSSIS 5-2.5-18.5 LF-MCG/0.5 IM SUSP: 0.5000 mL | Freq: Once | INTRAMUSCULAR | Status: DC

## 2016-11-02 MED ORDER — OXYTOCIN 40 UNITS IN LACTATED RINGERS INFUSION - SIMPLE MED
2.5000 [IU]/h | INTRAVENOUS | Status: DC
  Administered 2016-11-02: 2.5 [IU]/h via INTRAVENOUS
  Filled 2016-11-02: qty 1000

## 2016-11-02 MED ORDER — NALBUPHINE HCL 10 MG/ML IJ SOLN
INTRAMUSCULAR | Status: AC
  Administered 2016-11-02: 10 mg via INTRAVENOUS
  Filled 2016-11-02: qty 1

## 2016-11-02 MED ORDER — LACTATED RINGERS IV SOLN
INTRAVENOUS | Status: DC
  Administered 2016-11-02: 11:00:00 via INTRAVENOUS

## 2016-11-02 MED ORDER — LACTATED RINGERS IV SOLN: 500.0000 mL | INTRAVENOUS | Status: DC | PRN

## 2016-11-02 MED ORDER — OXYCODONE-ACETAMINOPHEN 5-325 MG PO TABS
2.0000 | ORAL_TABLET | ORAL | Status: DC | PRN
  Administered 2016-11-02: 2 via ORAL
  Filled 2016-11-02: qty 2

## 2016-11-02 MED ORDER — ONDANSETRON HCL 4 MG PO TABS: 4.0000 mg | ORAL_TABLET | ORAL | Status: DC | PRN

## 2016-11-02 MED ORDER — TERBUTALINE SULFATE 1 MG/ML IJ SOLN: 0.2500 mg | Freq: Once | INTRAMUSCULAR | Status: DC | PRN

## 2016-11-02 MED ORDER — SOD CITRATE-CITRIC ACID 500-334 MG/5ML PO SOLN: 30.0000 mL | ORAL | Status: DC | PRN

## 2016-11-02 MED ORDER — OXYCODONE-ACETAMINOPHEN 5-325 MG PO TABS
1.0000 | ORAL_TABLET | Freq: Once | ORAL | Status: AC
  Administered 2016-11-02: 2 via ORAL

## 2016-11-02 MED ORDER — WITCH HAZEL-GLYCERIN EX PADS: 1.0000 "application " | MEDICATED_PAD | CUTANEOUS | Status: DC | PRN

## 2016-11-02 MED ORDER — BUTORPHANOL TARTRATE 1 MG/ML IJ SOLN
1.0000 mg | INTRAMUSCULAR | Status: DC | PRN
  Administered 2016-11-02: 1 mg via INTRAVENOUS
  Filled 2016-11-02: qty 1
  Filled 2016-11-02: qty 2

## 2016-11-02 MED ORDER — NALBUPHINE HCL 10 MG/ML IJ SOLN
10.0000 mg | INTRAMUSCULAR | Status: DC | PRN
  Administered 2016-11-02: 10 mg via INTRAVENOUS

## 2016-11-02 MED ORDER — DIPHENHYDRAMINE HCL 25 MG PO CAPS: 25.0000 mg | ORAL_CAPSULE | Freq: Four times a day (QID) | ORAL | Status: DC | PRN

## 2016-11-02 MED ORDER — IBUPROFEN 600 MG PO TABS
600.0000 mg | ORAL_TABLET | Freq: Four times a day (QID) | ORAL | Status: DC
  Administered 2016-11-02 – 2016-11-03 (×2): 600 mg via ORAL
  Filled 2016-11-02 (×2): qty 1

## 2016-11-02 MED ORDER — LIDOCAINE HCL (PF) 1 % IJ SOLN: 30.0000 mL | INTRAMUSCULAR | Status: DC | PRN

## 2016-11-02 MED ORDER — OXYTOCIN BOLUS FROM INFUSION
500.0000 mL | Freq: Once | INTRAVENOUS | Status: AC
  Administered 2016-11-02: 500 mL via INTRAVENOUS

## 2016-11-02 MED ORDER — ZOLPIDEM TARTRATE 5 MG PO TABS: 5.0000 mg | ORAL_TABLET | Freq: Every evening | ORAL | Status: DC | PRN

## 2016-11-02 MED ORDER — BENZOCAINE-MENTHOL 20-0.5 % EX AERO
1.0000 "application " | INHALATION_SPRAY | CUTANEOUS | Status: DC | PRN
  Administered 2016-11-02: 1 via TOPICAL
  Filled 2016-11-02 (×2): qty 56

## 2016-11-02 MED ORDER — OXYCODONE-ACETAMINOPHEN 5-325 MG PO TABS
1.0000 | ORAL_TABLET | ORAL | Status: DC | PRN
  Administered 2016-11-02: 1 via ORAL
  Administered 2016-11-03 (×4): 2 via ORAL
  Administered 2016-11-03 – 2016-11-04 (×2): 1 via ORAL
  Filled 2016-11-02 (×2): qty 1
  Filled 2016-11-02: qty 2
  Filled 2016-11-02: qty 1
  Filled 2016-11-02 (×3): qty 2

## 2016-11-02 MED ORDER — COCONUT OIL OIL: 1.0000 "application " | TOPICAL_OIL | Status: DC | PRN

## 2016-11-02 MED ORDER — PRENATAL MULTIVITAMIN CH
1.0000 | ORAL_TABLET | Freq: Every day | ORAL | Status: DC
  Administered 2016-11-03: 1 via ORAL
  Filled 2016-11-02: qty 1

## 2016-11-02 MED ORDER — DIBUCAINE 1 % RE OINT: 1.0000 "application " | TOPICAL_OINTMENT | RECTAL | Status: DC | PRN

## 2016-11-02 MED ORDER — ACETAMINOPHEN 325 MG PO TABS: 650.0000 mg | ORAL_TABLET | ORAL | Status: DC | PRN

## 2016-11-02 MED ORDER — BUTORPHANOL TARTRATE 1 MG/ML IJ SOLN
2.0000 mg | INTRAMUSCULAR | Status: DC | PRN
  Administered 2016-11-02 (×3): 2 mg via INTRAVENOUS
  Filled 2016-11-02 (×2): qty 2

## 2016-11-02 MED ORDER — OXYTOCIN 40 UNITS IN LACTATED RINGERS INFUSION - SIMPLE MED
1.0000 m[IU]/min | INTRAVENOUS | Status: DC
  Filled 2016-11-02: qty 1000

## 2016-11-02 MED ORDER — SENNOSIDES-DOCUSATE SODIUM 8.6-50 MG PO TABS
2.0000 | ORAL_TABLET | ORAL | Status: DC
  Administered 2016-11-03 (×2): 2 via ORAL
  Filled 2016-11-02 (×2): qty 2

## 2016-11-02 MED ORDER — SIMETHICONE 80 MG PO CHEW: 80.0000 mg | CHEWABLE_TABLET | ORAL | Status: DC | PRN

## 2016-11-02 MED ORDER — ONDANSETRON HCL 4 MG/2ML IJ SOLN: 4.0000 mg | Freq: Four times a day (QID) | INTRAMUSCULAR | Status: DC | PRN

## 2016-11-02 MED ORDER — OXYCODONE-ACETAMINOPHEN 5-325 MG PO TABS
ORAL_TABLET | ORAL | Status: AC
  Administered 2016-11-02: 2 via ORAL
  Filled 2016-11-02: qty 2

## 2016-11-02 MED ORDER — ONDANSETRON HCL 4 MG/2ML IJ SOLN: 4.0000 mg | INTRAMUSCULAR | Status: DC | PRN

## 2016-11-02 NOTE — H&P (Signed)
Obstetric History and Physical  Isabel [name] is a 25 y.o. [phone] with IUP at [redacted]w[redacted]d presenting for worsening contractions. Patient states she has been having  regular, every 2-4 minutes contractions, none vaginal bleeding, intact membranes, with active fetal movement.   Of note, patient was seen in triage earlier this morning for similar complaints, but was not found to be in active labor and discharged home.   Prenatal Course Source of Care: [facility] with onset of care at 9 weeks Pregnancy complications or risks: Patient Active Problem List   Diagnosis Date Noted  . Labor and delivery, indication for care 11/02/2016  . Uterine contractions during pregnancy 11/02/2016  . Varicose veins of vulva in third trimester, antepartum 10/07/2016  . Supervision of normal intrauterine pregnancy in multigravida in third trimester 10/07/2016  . 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 Subchorionic hematoma in first trimester, antepartum 05/27/2016  . History of miscarriage, currently pregnant 04/21/2016  . Positive urine drug screen 03/27/2016  . Tobacco user 02/26/2016  . Irregular periods/menstrual cycles 02/26/2016  . Neck pain 02/22/2012  . DIARRHEA 10/25/2008  . ABDOMINAL PAIN, EPIGASTRIC 09/25/2008  . LOW BACK PAIN 08/28/2008  . VASOVAGAL SYNCOPE 08/28/2008   She plans to breastfeed She desires vasectomy for postpartum contraception.   Prenatal labs and studies: ABO, Rh: A/Positive/-- (05/11 1516) Antibody: Negative (05/11 1516) Rubella: 3.38 (05/11 1516) RPR: Non Reactive (05/11 1516)  HBsAg: Negative (05/11 1516)  HIV: Non Reactive (05/11 1516)  [id number] (11/29 1621) 1 hr Glucola  normal Genetic screening normal Anatomy US normal   Past Medical History:  Diagnosis Date  . Dysmenorrhea   . Migraines     Past Surgical History:  Procedure Laterality Date  . CHOLECYSTECTOMY      OB History  Gravida Para Term Preterm AB Living  4 1 1   2 1   SAB TAB  Ectopic Multiple Live Births  2       1    # Outcome Date GA Lbr Len/2nd Weight Sex Delivery Anes PTL Lv  4 Current           3 SAB 2016 [redacted]w[redacted]d         2 SAB 2016 [redacted]w[redacted]d         1 Term 2011 [redacted]w[redacted]d  7 lb 1.8 oz (3.225 kg) F Vag-Spont EPI  LIV      Social History   Social History  . Marital status: Single    Spouse name: N/A  . Number of children: N/A  . Years of education: 61   Social History Main Topics  . Smoking status: Current Every Day Smoker    Packs/day: 0.25    Types: Cigarettes  . Smokeless tobacco: Never Used  . Alcohol use No  . Drug use:     Types: Marijuana     Comment: last smoked - 01/23/2016- helps with her cramps- smokes weekly  . Sexual activity: Yes     Comment: Pregnant    Other Topics Concern  . None   Social History Narrative  . None    Family History  Problem Relation Age of Onset  . Breast cancer Mother   . Ovarian cancer Mother   . Arthritis    . Diabetes    . Cancer    . Diabetes Maternal Grandmother   . Diabetes Maternal Grandfather   . Diabetes Paternal Grandmother   . Diabetes Paternal Grandfather   . Colon cancer Neg Hx   . Heart disease  Neg Hx     Prescriptions Prior to Admission  Medication Sig Dispense Refill Last Dose  . Prenat-FeFum-FePo-FA-Omega 3 (CONCEPT DHA) 53.5-38-1 MG CAPS Take 53.5 mg by mouth daily. 30 capsule 11 Taking    Allergies  Allergen Reactions  . Amoxicillin Hives  . Penicillins Hives    Review of Systems: Negative except for what is mentioned in HPI.  Physical Exam: BP 118/75 (BP Location: Left Arm)   Pulse 94   Temp 98 F (36.7 C) (Oral)   Resp 18   Ht 5\' 4"  (1.626 m)   Wt 161 lb (73 kg)   LMP 01/23/2016 (Approximate)   BMI 27.64 kg/m  CONSTITUTIONAL: Well-developed, well-nourished female in mild distress with contractions HENT:  Normocephalic, atraumatic, External right and left ear normal. Oropharynx is clear and moist EYES: Conjunctivae and EOM are normal. Pupils are equal, round, and  reactive to light. No scleral icterus.  NECK: Normal range of motion, supple, no masses SKIN: Skin is warm and dry. No rash noted. Not diaphoretic. No erythema. No pallor. NEUROLOGIC: Alert and oriented to person, place, and time. Normal reflexes, muscle tone coordination. No cranial nerve deficit noted. PSYCHIATRIC: Normal mood and affect. Normal behavior. Normal judgment and thought content. CARDIOVASCULAR: Normal heart rate noted, regular rhythm RESPIRATORY: Effort and breath sounds normal, no problems with respiration noted ABDOMEN: Soft, nontender, nondistended, gravid. MUSCULOSKELETAL: Normal range of motion. No edema and no tenderness. 2+ distal pulses.  Cervical Exam: Dilatation 5cm   Effacement 90-100%   Station 0   Presentation: cephalic FHT:  Baseline rate 115 bpm   Variability moderate  Accelerations present   Decelerations none Contractions: Every 2-3 mins   Pertinent Labs/Studies:   Results for orders placed or performed during the hospital encounter of 11/02/16  CBC  Result Value Ref Range   WBC 23.2 (H) 3.6 - 11.0 K/uL   RBC 4.17 3.80 - 5.20 MIL/uL   Hemoglobin 13.5 12.0 - 16.0 g/dL   HCT 38.5 35.0 - 47.0 %   MCV 92.2 80.0 - 100.0 fL   MCH 32.3 26.0 - 34.0 pg   MCHC 35.0 32.0 - 36.0 g/dL   RDW 14.0 11.5 - 14.5 %   Platelets 274 150 - 440 K/uL  OB RESULTS CONSOLE Varicella zoster antibody, IgG  Result Value Ref Range   Varicella Immune   Type and screen  Result Value Ref Range   ABO/RH(D) A POS    Antibody Screen NEG    Sample Expiration 11/05/2016      Results for orders placed or performed during the hospital encounter of 11/02/16 (from the past 24 hour(s))  Urinalysis, Routine w reflex microscopic     Status: Abnormal   Collection Time: 11/02/16  2:28 AM  Result Value Ref Range   Color, Urine YELLOW (A) YELLOW   APPearance CLEAR (A) CLEAR   Specific Gravity, Urine 1.010 1.005 - 1.030   pH 7.0 5.0 - 8.0   Glucose, UA NEGATIVE NEGATIVE mg/dL   Hgb urine  dipstick NEGATIVE NEGATIVE   Bilirubin Urine NEGATIVE NEGATIVE   Ketones, ur NEGATIVE NEGATIVE mg/dL   Protein, ur NEGATIVE NEGATIVE mg/dL   Nitrite NEGATIVE NEGATIVE   Leukocytes, UA SMALL (A) NEGATIVE   RBC / HPF 0-5 0 - 5 RBC/hpf   WBC, UA 0-5 0 - 5 WBC/hpf   Bacteria, UA FEW (A) NONE SEEN   Squamous Epithelial / LPF 0-5 (A) NONE SEEN   Mucous PRESENT   Urine Drug Screen, Qualitative ([facility] only)  Status: Abnormal   Collection Time: 11/02/16  2:28 AM  Result Value Ref Range   Tricyclic, Ur Screen NONE DETECTED NONE DETECTED   Amphetamines, Ur Screen NONE DETECTED NONE DETECTED   MDMA (Ecstasy)Ur Screen NONE DETECTED NONE DETECTED   Cocaine Metabolite,Ur Junction NONE DETECTED NONE DETECTED   Opiate, Ur Screen NONE DETECTED NONE DETECTED   Phencyclidine (PCP) Ur S NONE DETECTED NONE DETECTED   Cannabinoid 50 Ng, Ur [place] POSITIVE (A) NONE DETECTED   Barbiturates, Ur Screen NONE DETECTED NONE DETECTED   Benzodiazepine, Ur Scrn NONE DETECTED NONE DETECTED   Methadone Scn, Ur NONE DETECTED NONE DETECTED    Assessment : ONELIA ZEIEN is a 25 y.o. [id number] at [redacted]w[redacted]d being admitted for labor.  UDS positive again today for marijuana use in pregnancy.   Plan: Labor: Expectant management. Augmentation as needed, per protocol FWB: Reassuring fetal heart tracing.  GBS negative. Delivery plan: Hopeful for vaginal delivery   [name], MD [facility]

## 2016-11-02 NOTE — Discharge Instructions (Signed)
LABOR: When contractions begin, you should start to time them from the beginning of one contraction to the beginning of the next.  When contractions are 5-10 minutes apart or less and have been regular for at least an hour, you should call your health care provider.  Notify your doctor if any of the following occur: 1. Bleeding from the vagina 7. Sudden, constant, or occasional abdominal pain  2. Pain or burning when urinating 8. Sudden gushing of fluid from the vagina (with or without continued leaking)  3. Chills or fever 9. Fainting spells, "black outs" or loss of consciousness  4. Increase in vaginal discharge 10. Severe or continued nausea or vomiting  5. Pelvic pressure (sudden increase) 11. Blurring of vision or spots before the eyes  6. Baby moving less than usual 12. Leaking of fluid    FETAL KICK COUNT: Lie on your left side for one hour after a meal, and count the number of times your baby kicks. If it is less than 5 times, get up, move around and drink some juice. Repeat the test 30 minutes later. If it is still less than 5 kicks in an hour, notify your doctor.

## 2016-11-02 NOTE — H&P (Deleted)
Intrapartum Progress Note  S: Patient very uncomfortable, unable to get epidural due to elevated WBC count, per anesthesia.   O: Blood pressure 113/76, pulse 76, temperature 98 F (36.7 C), temperature source Oral, resp. rate 18, height 5\' 4"  (1.626 m), weight 161 lb (73 kg), last menstrual period 01/23/2016. Gen App: NAD, moderately distressed with contractions Abdomen: soft, gravid FHT: baseline 130 bpm.  Accels present.  Decels absent. moderate in degree variability.   Tocometer: contractions irregular, q 2-4 minutes Cervix: 6/90/0 to +1 Extremities: Nontender, no edema.  Labs: No new labs   Assessment:  1: SIUP at [redacted]w[redacted]d 2. Leukocytosis  Plan:  1. Notes that Stadol helps, but wears off very fast.  Will change to Nubain.  2. Will repeat CBC at 6 hr mark to reassess leukocytosis. If decreased, patient will be able to receive epidural.  3. AROM'd with clear fluid (small amount).   4. Anticipate vaginal delivery  [name], MD 11/02/2016 3:08 PM

## 2016-11-02 NOTE — Final Progress Note (Signed)
L&D OB Triage Note  Isabel [name] is a 25 y.o. [id number] female at [redacted]w[redacted]d, EDD Estimated Date of Delivery: 11/04/16 who presented to triage for complaints of contractions q 5 minutes.  She was evaluated by the nurses with no significant findings for active labor. Vital signs stable. An NST was performed and has been reviewed by MD. She was treated with Percocet for pain.    Physical Exam:  Blood pressure 124/81, pulse 85, temperature 97.8 F (36.6 C), temperature source Oral, resp. rate 18, last menstrual period 01/23/2016.  Gen App: mild distress with contractions Cervix: 3/80/-1/posterior (unchanged after 2 hrs by nurse exam)  NST INTERPRETATION: Indications: rule out uterine contractions  Mode: External Baseline Rate (A): 110 bpm Variability: Moderate Accelerations: 15 x 15 Decelerations: None     Contraction Frequency (min): 2-5  Impression: reactive   Plan: NST performed was reviewed and was found to be reactive. She was discharged home with bleeding/labor precautions.  Continue routine prenatal care. Follow up with OB/GYN as previously scheduled.     [name], MD [facility]

## 2016-11-03 LAB — CBC
HCT: 32.8 % — ABNORMAL LOW (ref 35.0–47.0)
Hemoglobin: 11.5 g/dL — ABNORMAL LOW (ref 12.0–16.0)
MCH: 32.9 pg (ref 26.0–34.0)
MCHC: 35 g/dL (ref 32.0–36.0)
MCV: 93.8 fL (ref 80.0–100.0)
Platelets: 212 10*3/uL (ref 150–440)
RBC: 3.5 MIL/uL — ABNORMAL LOW (ref 3.80–5.20)
RDW: 13.6 % (ref 11.5–14.5)
WBC: 16.7 10*3/uL — ABNORMAL HIGH (ref 3.6–11.0)

## 2016-11-03 LAB — RPR: RPR Ser Ql: NONREACTIVE

## 2016-11-03 MED ORDER — IBUPROFEN 800 MG PO TABS: 800.0000 mg | ORAL_TABLET | Freq: Three times a day (TID) | ORAL | 1 refills | Status: DC | PRN

## 2016-11-03 MED ORDER — IBUPROFEN 600 MG PO TABS
600.0000 mg | ORAL_TABLET | Freq: Four times a day (QID) | ORAL | Status: DC
  Administered 2016-11-03 – 2016-11-04 (×4): 600 mg via ORAL
  Filled 2016-11-03 (×4): qty 1

## 2016-11-03 NOTE — Progress Notes (Signed)
Intrapartum Progress Note  S: Patient very uncomfortable, unable to get epidural due to elevated WBC count, per anesthesia.   O: Blood pressure 106/66, pulse 84, temperature 98.4 F (36.9 C), temperature source Oral, resp. rate 19, height 5\' 4"  (1.626 m), weight 161 lb (73 kg), last menstrual period 01/23/2016, SpO2 100 %, unknown if currently breastfeeding. Gen App: NAD, moderately distressed with contractions Abdomen: soft, gravid FHT: baseline 130 bpm.  Accels present.  Decels absent. moderate in degree variability.   Tocometer: contractions irregular, q 2-4 minutes Cervix: 6/90/0 to +1 Extremities: Nontender, no edema.  Labs: No new labs   Assessment:  1: SIUP at [redacted]w[redacted]d 2. Leukocytosis  Plan:  1. Notes that Stadol helps, but wears off very fast.  Will change to Nubain.  2. Will repeat CBC at 6 hr mark to reassess leukocytosis. If decreased, patient will be able to receive epidural.  3. AROM'd with clear fluid (small amount).   4. Anticipate vaginal delivery  [name], MD 11/03/2016 9:00 AM

## 2016-11-03 NOTE — Discharge Summary (Signed)
Obstetric Discharge Summary Reason for Admission: onset of labor Prenatal Procedures: ultrasound Intrapartum Procedures: spontaneous vaginal delivery Postpartum Procedures: none Complications-Operative and Postpartum: none Hemoglobin  Date Value Ref Range Status  11/03/2016 11.5 (L) 12.0 - 16.0 g/dL Final   HGB  Date Value Ref Range Status  12/02/2014 15.4 12.0 - 16.0 g/dL Final   HCT  Date Value Ref Range Status  11/03/2016 32.8 (L) 35.0 - 47.0 % Final   Hematocrit  Date Value Ref Range Status  08/19/2016 33.2 (L) 34.0 - 46.6 % Final    Physical Exam:  Blood pressure 106/66, pulse 84, temperature 98.4 F (36.9 C), temperature source Oral, resp. rate 19, height 5\' 4"  (1.626 m), weight 161 lb (73 kg), last menstrual period 01/23/2016, SpO2 100 %, unknown if currently breastfeeding.  General: alert and no distress Lochia: appropriate Uterine Fundus: firm Incision: none DVT Evaluation: No evidence of DVT seen on physical exam. Negative Homan's sign. No cords or calf tenderness. No significant calf/ankle edema.  Discharge Diagnoses: Term Pregnancy-delivered  Discharge Information: Date: 11/04/2016 Activity: pelvic rest Diet: routine Medications: PNV and Ibuprofen Condition: stable Instructions: refer to practice specific booklet Discharge to: home [place], [place] Follow up in 6 week(s).   Specialties:  Obstetrics and Gynecology, Radiology Why:  Postpartum visit Contact information: [place] [address] [phone]           Newborn Data: Live born female  Birth Weight: 6 lb 8.4 oz (2960 g) APGAR: 8, 9  Home with mother.  Isabel [name] 11/04/2016, 7:53 AM

## 2016-11-03 NOTE — Clinical Social Work Note (Signed)
The following is the CSW documentation on patient's newborn's MEDICAL RECORD NUMBER CLINICAL SOCIAL WORK MATERNAL/CHILD NOTE  Patient Details  Name: Isabel [name] MRN: [id number] Date of Birth: 11/02/2016  Date:  11/03/2016  Clinical Social Worker Initiating Note:  Shela Leff MSW,LCSW         Date/ Time Initiated:  11/03/16/                 Child's Name:      Legal Guardian:  Mother   Need for Interpreter:  None   Date of Referral:        Reason for Referral:  Current Substance Use/Substance Use During Pregnancy    Referral Source:  RN   Address:     Phone number:      Household Members: Minor Children, Significant Other   Natural Supports (not living in the home): Extended Family   Professional Supports:None   Employment:    Type of Work:     Education:      Museum/gallery curator Resources:Medicaid   Other Resources: [facility]   Cultural/Religious Considerations Which May Impact Care: none  Strengths: Ability to meet basic needs , Home prepared for child    Risk Factors/Current Problems: Substance Use    Cognitive State: Alert , Able to Concentrate , Goal Oriented    Mood/Affect: Calm , Bright , Relaxed    CSW Assessment:CSW consulted for drug exposure of newborn. CSW met with patient's mother this morning and explained role and purpose of visit. Patient's mother stated that in the home will be her 25 year old, her significant other/father of baby and their newborn. Patient's mother states she has all necessities for her newborn and has no concerns regarding transportation. Patient denies any history of mental illness. In response to her and her newborn being marijuana positive in their drug screens, patient's mother reports that she used marijuana to increase her appetite. CSW explained that a DSS CPS report will need to be made and patient did not seem concerned or surprised by this. CSW to make DSS CPS report.  CSW  Plan/Description: Child Protective Service Report     Shela Leff, LCSW 11/03/2016, 10:14 AM

## 2016-11-03 NOTE — Care Management (Signed)
Received referral for wanting more information about arranging Medical for the new baby. Discussed that she would need to contact the Department of Social Services and arrange an appointment to start the baby's medicaid application. Shelbie Ammons RN MSN CCM Care Management

## 2016-11-03 NOTE — Discharge Instructions (Signed)

## 2016-11-03 NOTE — Progress Notes (Signed)
Post Partum Day # 1, s/p SVD  Subjective: no complaints, up ad lib, voiding and tolerating PO  Objective: Temp:  [98 F (36.7 C)-98.4 F (36.9 C)] 98.4 F (36.9 C) (12/26 0748) Pulse Rate:  [56-141] 84 (12/26 0748) Resp:  [14-19] 19 (12/26 0748) BP: (91-118)/(57-81) 106/66 (12/26 0748) SpO2:  [96 %-100 %] 100 % (12/26 0748) Weight:  [161 lb (73 kg)] 161 lb (73 kg) (12/25 1002)  Physical Exam:  General: alert and no distress  Lungs: clear to auscultation bilaterally Breasts: normal appearance, no masses or tenderness Heart: regular rate and rhythm, S1, S2 normal, no murmur, click, rub or gallop Pelvis: Lochia: appropriate, Uterine Fundus: firm Extremities: DVT Evaluation: No evidence of DVT seen on physical exam. Negative Homan's sign. No cords or calf tenderness. No significant calf/ankle edema.   CBC Latest Ref Rng & Units 11/03/2016 11/02/2016 08/19/2016  WBC 3.6 - 11.0 K/uL 16.7(H) 23.2(H) 12.1(H)  Hemoglobin 12.0 - 16.0 g/dL 11.5(L) 13.5 -  Hematocrit 35.0 - 47.0 % 32.8(L) 38.5 33.2(L)  Platelets 150 - 440 K/uL 212 274 229    Assessment/Plan: Circumcision prior to discharge and Contraception vasectomy.   Bottle feeding due to h/o marijuana use in pregnancy Can d/c home later today, pending Pediatrician recommendations.    LOS: 1 day   [place]

## 2016-11-04 ENCOUNTER — Encounter: Payer: Medicaid Other | Admitting: Obstetrics and Gynecology

## 2016-11-04 NOTE — Progress Notes (Signed)
Post Partum Day # 2, s/p SVD  Subjective: no complaints, up ad lib, voiding and tolerating PO  Objective: Vitals:   11/03/16 1113 11/03/16 1637 11/03/16 1906 11/04/16 0742  BP: 99/62 103/61 111/72 101/70  Pulse: 65 67 76 80  Resp: 18 18 18 18   Temp: 98.6 F (37 C) 98.1 F (36.7 C) 98 F (36.7 C) 98.1 F (36.7 C)  TempSrc: Oral Oral Oral Oral  SpO2:   99% 100%  Weight:      Height:        Physical Exam:  General: alert and no distress  Lungs: clear to auscultation bilaterally Breasts: normal appearance, no masses or tenderness Heart: regular rate and rhythm, S1, S2 normal, no murmur, click, rub or gallop Pelvis: Lochia: appropriate, Uterine Fundus: firm Extremities: DVT Evaluation: No evidence of DVT seen on physical exam. Negative Homan's sign. No cords or calf tenderness. No significant calf/ankle edema.   CBC Latest Ref Rng & Units 11/03/2016 11/02/2016 08/19/2016  WBC 3.6 - 11.0 K/uL 16.7(H) 23.2(H) 12.1(H)  Hemoglobin 12.0 - 16.0 g/dL 11.5(L) 13.5 -  Hematocrit 35.0 - 47.0 % 32.8(L) 38.5 33.2(L)  Platelets 150 - 440 K/uL 212 274 229    Assessment/Plan: Discharge home, Circumcision after discharge and Contraception vasectomy.   Bottle feeding due to h/o marijuana use in pregnancy   LOS: 2 days   [name], MD [facility]

## 2016-11-04 NOTE — Progress Notes (Signed)
Patient understands all discharge instructions and the need to make follow up appointments. Patient discharge via wheelchair with auxillary. 

## 2016-11-06 IMAGING — US US OB COMP LESS 14 WK
1 series · 13 of 28 positions shown · non-contrast
Comparison: CT of the abdomen and pelvis from [DATE]

CLINICAL DATA: Acute onset of vaginal bleeding.  Initial encounter.

EXAM:
OBSTETRIC <14 WK US AND TRANSVAGINAL OB US
TECHNIQUE: Both transabdominal and transvaginal ultrasound examinations were
performed for complete evaluation of the gestation as well as the
maternal uterus, adnexal regions, and pelvic cul-de-sac.
Transvaginal technique was performed to assess early pregnancy.

[Series 1: us ob comp less 14 wk · 0.23mm/px · 45 acquisitions, 13 frames shown]
[im 2/45]
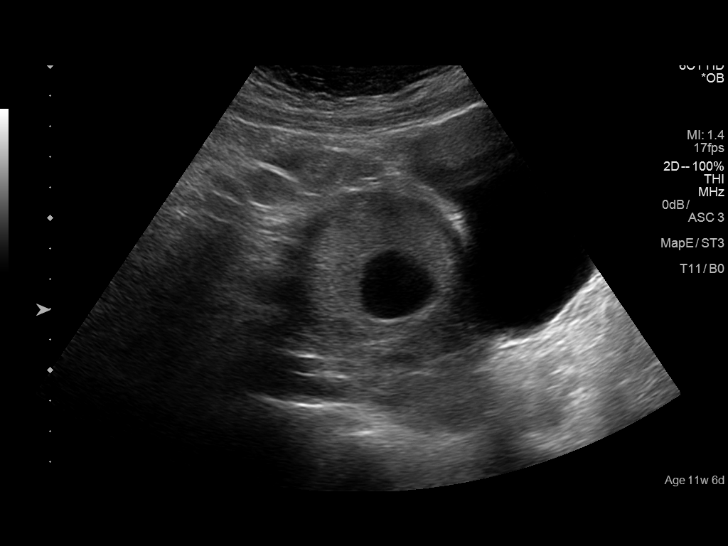
[im 5/45]
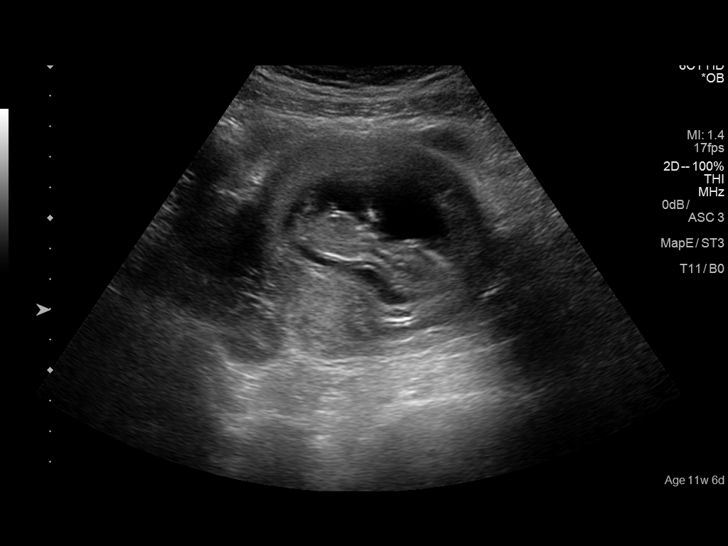
[im 9/45]
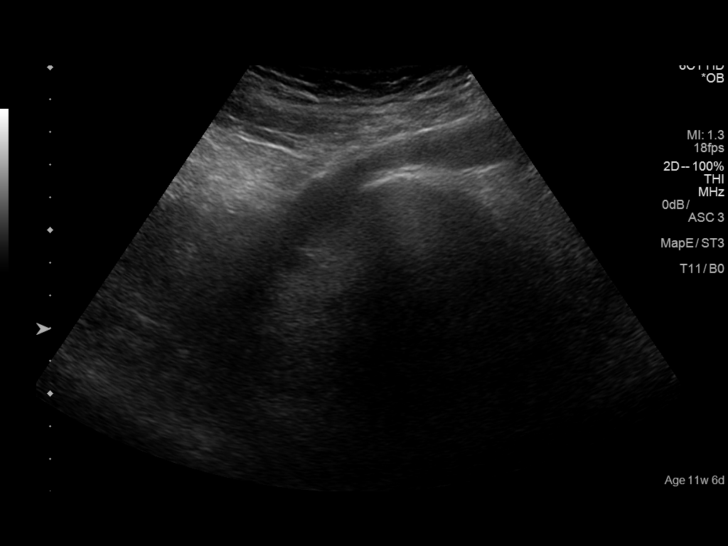
[im 12/45]
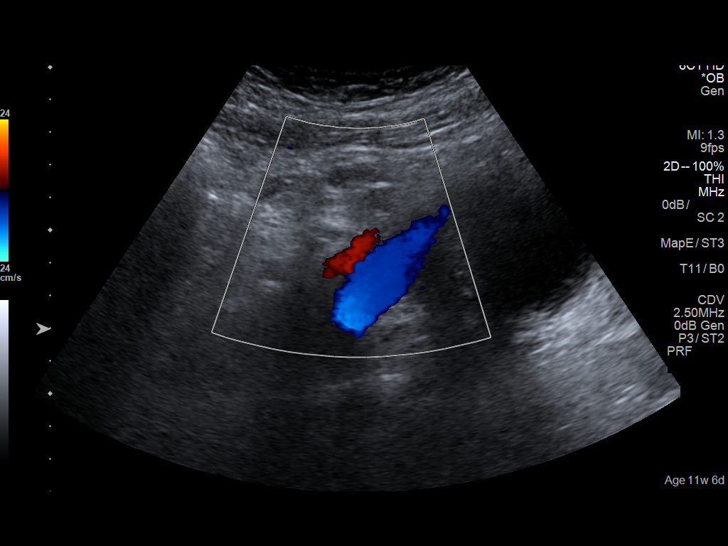
[im 15/45]
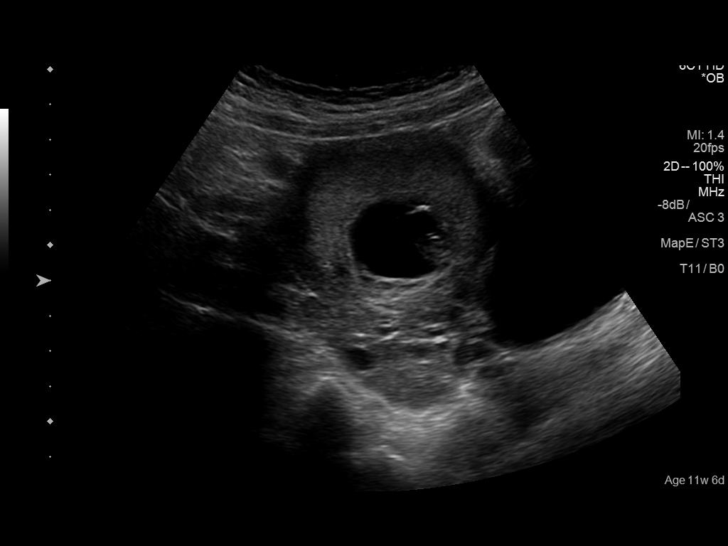
[im 18/45]
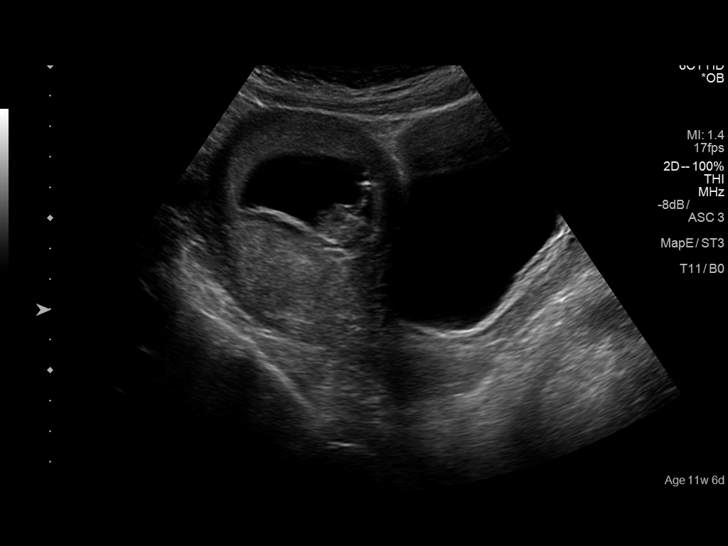
[im 23/45]
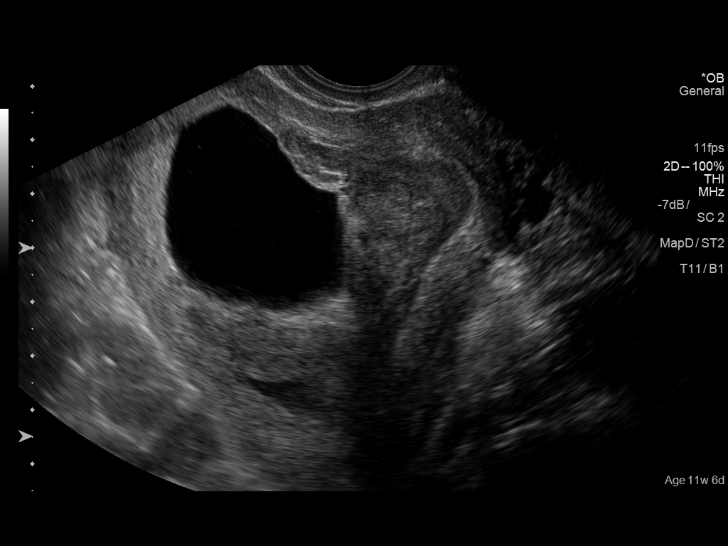
[im 27/45]
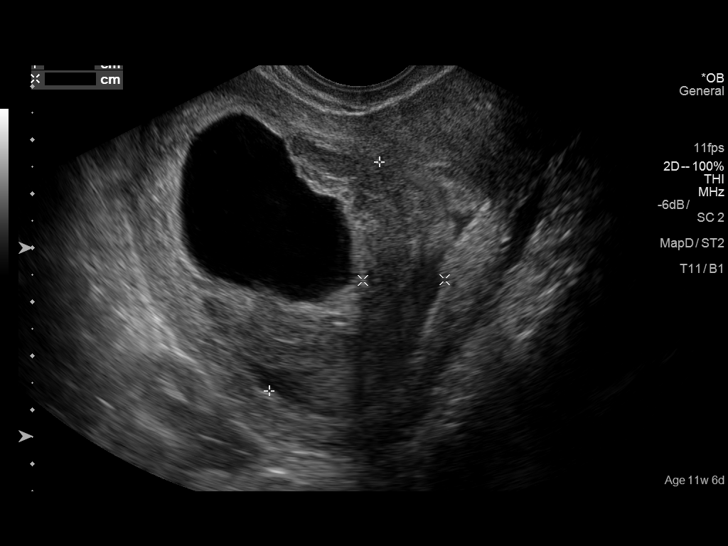
[im 30/45]
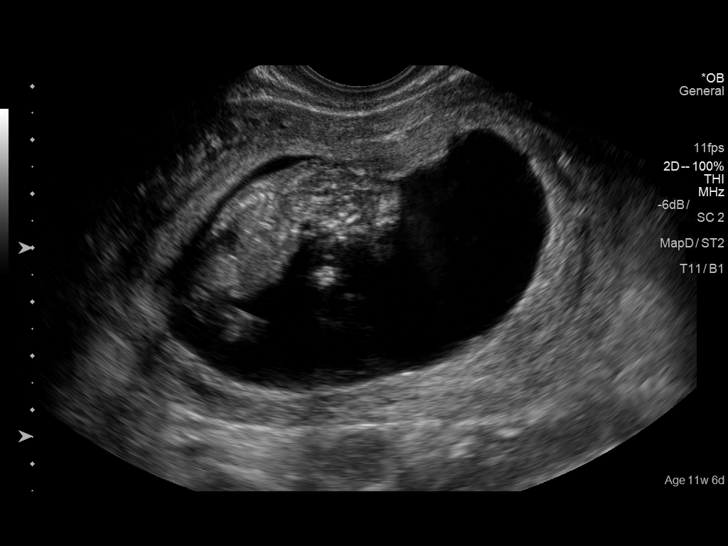
[im 33/45]
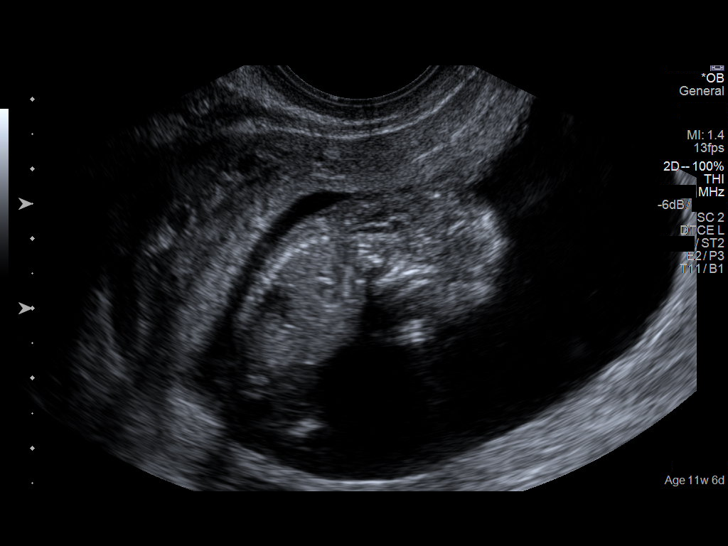
[im 36/45]
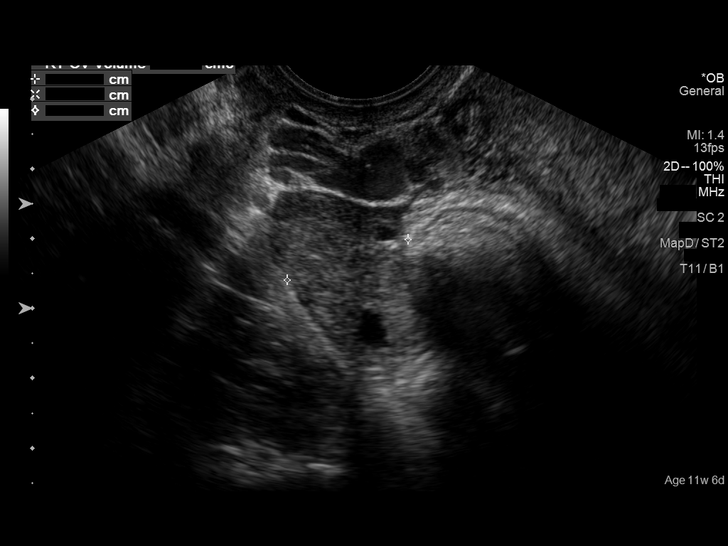
[im 40/45]
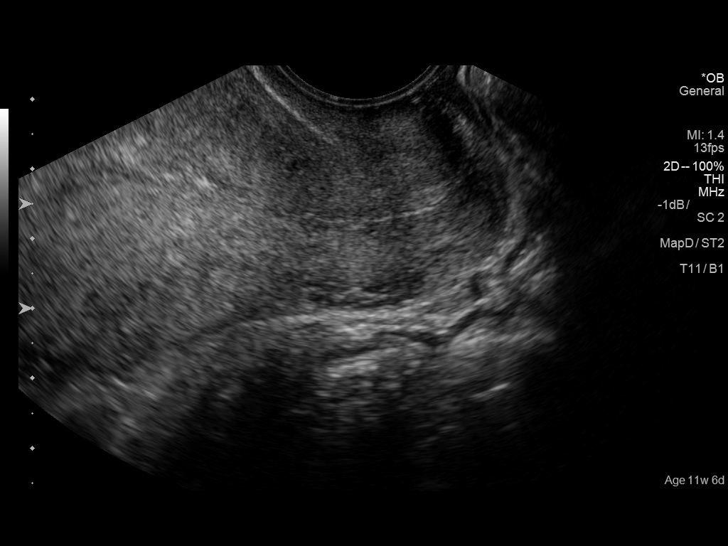
[im 43/45]
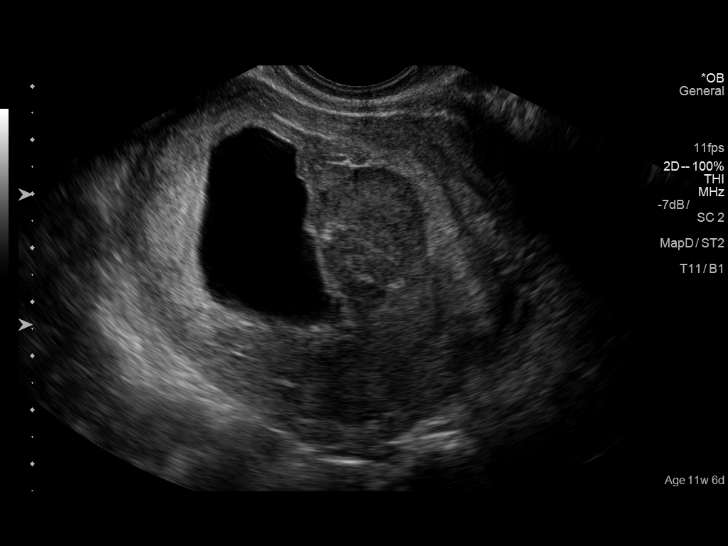

[13 of 28 positions shown; findings below may reference images not displayed]

FINDINGS: Intrauterine gestational sac: Single; visualized and normal in
shape.

Yolk sac:  No

Embryo:  Yes

Cardiac Activity: Yes

Heart Rate: 163  bpm

CRL:  4.91 cm   11 w   5 d                  US EDC: [DATE]

Subchorionic hemorrhage: A moderate amount of subchorionic
hemorrhage is noted.

Maternal uterus/adnexae: The uterus is otherwise unremarkable.

The right ovary is unremarkable in appearance, measuring 3.2 x 1.8 x
1.8 cm. The left ovary is not visualized on this study. No
suspicious adnexal masses are seen.

No free fluid is seen within the pelvic cul-de-sac.
IMPRESSION: 1. Single live intrauterine pregnancy noted, with a crown-rump
length of 4.9 cm, corresponding to a gestational age of 11 weeks 5
days. This matches the gestational age of 12 weeks 5 days by LMP,
reflecting an estimated date of delivery [DATE].
2. Moderate amount of subchorionic hemorrhage noted.

## 2016-12-08 ENCOUNTER — Encounter: Payer: Medicaid Other | Admitting: Obstetrics and Gynecology

## 2016-12-08 ENCOUNTER — Ambulatory Visit (INDEPENDENT_AMBULATORY_CARE_PROVIDER_SITE_OTHER): Payer: Medicaid Other | Admitting: Obstetrics and Gynecology

## 2016-12-08 ENCOUNTER — Encounter: Payer: Self-pay | Admitting: Obstetrics and Gynecology

## 2016-12-08 DIAGNOSIS — F53 Postpartum depression: Secondary | ICD-10-CM

## 2016-12-08 DIAGNOSIS — Z720 Tobacco use: Secondary | ICD-10-CM

## 2016-12-08 DIAGNOSIS — Z8742 Personal history of other diseases of the female genital tract: Secondary | ICD-10-CM

## 2016-12-08 DIAGNOSIS — Z87898 Personal history of other specified conditions: Secondary | ICD-10-CM

## 2016-12-08 DIAGNOSIS — O99345 Other mental disorders complicating the puerperium: Secondary | ICD-10-CM

## 2016-12-08 MED ORDER — BUPROPION HCL 100 MG PO TABS: 100.0000 mg | ORAL_TABLET | Freq: Two times a day (BID) | ORAL | 3 refills | Status: DC

## 2016-12-08 NOTE — Progress Notes (Signed)
   OBSTETRICS POSTPARTUM CLINIC PROGRESS NOTE  Subjective:     Isabel [name] is a 26 y.o. [phone] female who presents for a postpartum visit. She is 6 weeks postpartum following a spontaneous vaginal delivery. I have fully reviewed the prenatal and intrapartum course. The delivery was at 15 gestational weeks.  Anesthesia: epidural. Postpartum course has been well. Baby's course has been well. Baby is feeding by bottle - Similac Advance. Bleeding: patient has not resumed menses, with No LMP recorded. Bowel function is normal. Bladder function is normal. Patient is not sexually active. Contraception method desired is vasectomy. Postpartum depression screening: POSITIVE. PHQ-9 Score 16.  The following portions of the patient's history were reviewed and updated as appropriate: allergies, current medications, past family history, past medical history, past social history, past surgical history and problem list.  Review of Systems Pertinent items noted in HPI and remainder of comprehensive ROS otherwise negative.   Objective:    BP 106/67 (BP Location: Left Arm, Patient Position: Sitting)   Pulse 91   Wt 143 lb 4.8 oz (65 kg)   Breastfeeding? No   BMI 24.60 kg/m      General:  alert and no distress   Breasts:  inspection negative, no nipple discharge or bleeding, no masses or nodularity palpable  Lungs: clear to auscultation bilaterally  Heart:  regular rate and rhythm, S1, S2 normal, no murmur, click, rub or gallop  Abdomen: soft, non-tender; bowel sounds normal; no masses,  no organomegaly.    Vulva:  normal  Vagina: normal vagina, no discharge, exudate, lesion, or erythema  Cervix:  no cervical motion tenderness and no lesions  Corpus: normal size, contour, position, consistency, mobility, non-tender  Adnexa:  normal adnexa and no mass, fullness, tenderness  Rectal Exam: Not performed.         Labs:  Lab Results  Component Value Date   HGB 11.5 (L) 11/03/2016    Assessment:    Routine postpartum exam.   Tobacco abuse Postpartum depression.  H/o abnormal pap smear  Plan:   1. Contraception: vasectomy. But will use OCPs until that time. Given samples of Lo-Loestrin.  2. Postpartum depression, discussed counseling and medications. Patient declined counseling, willing to try medication. Will prescribe Wellbutrin as patient also desires smoking cessation.  3. Follow up in: 2-3 weeks for postpartum depression .  4. Abnormal pap smear (LGSIL) last year 03/2016, needs repeat. Will perform at annual exam in 5 months.   [name], MD [facility]

## 2016-12-08 NOTE — Progress Notes (Deleted)
   OBSTETRICS POSTPARTUM CLINIC PROGRESS NOTE  Subjective:     Isabel [name] is a 26 y.o. [phone] female who presents for a postpartum visit. She is 6 weeks postpartum following a spontaneous vaginal delivery. I have fully reviewed the prenatal and intrapartum course. The delivery was at 17 gestational weeks.  Anesthesia: epidural. Postpartum course has been well. Baby's course has been well. Baby is feeding by bottle - Similac Advance. Bleeding: patient has not resumed menses, with No LMP recorded.. Bowel function is normal. Bladder function is normal. Patient is not sexually active. Contraception method desired is vasectomy. Postpartum depression screening: POSITIVE. PHQ-9 Score 16.  {Common ambulatory SmartLinks:19316}  Review of Systems {ros; complete:30496}   Objective:    There were no vitals taken for this visit.  General:  alert and no distress   Breasts:  inspection negative, no nipple discharge or bleeding, no masses or nodularity palpable  Lungs: clear to auscultation bilaterally  Heart:  regular rate and rhythm, S1, S2 normal, no murmur, click, rub or gallop  Abdomen: soft, non-tender; bowel sounds normal; no masses,  no organomegaly.  ***Well healed Pfannenstiel incision   Vulva:  normal  Vagina: normal vagina, no discharge, exudate, lesion, or erythema  Cervix:  no cervical motion tenderness and no lesions  Corpus: normal size, contour, position, consistency, mobility, non-tender  Adnexa:  normal adnexa and no mass, fullness, tenderness  Rectal Exam: Not performed.         Labs:  Lab Results  Component Value Date   HGB 11.5 (L) 11/03/2016     Assessment:    *** postpartum exam.   @DIAGNOSES @   Plan:    1. Contraception: {method:5051} 2. Will check Hgb for h/o anemia.  3. Follow up in: {1-10:13787} {time; units:19136} or as needed.    Gordy Clement, [address] [facility]

## 2016-12-29 ENCOUNTER — Encounter: Payer: Medicaid Other | Admitting: Obstetrics and Gynecology

## 2017-09-28 ENCOUNTER — Encounter: Payer: Self-pay | Admitting: Emergency Medicine

## 2017-09-28 ENCOUNTER — Emergency Department: Payer: Self-pay

## 2017-09-28 ENCOUNTER — Emergency Department
Admission: EM | Admit: 2017-09-28 | Discharge: 2017-09-28 | Disposition: A | Discharge status: Home / Self Care | Payer: Self-pay | Attending: Emergency Medicine | Admitting: Emergency Medicine

## 2017-09-28 DIAGNOSIS — Z79899 Other long term (current) drug therapy: Secondary | ICD-10-CM | POA: Insufficient documentation

## 2017-09-28 DIAGNOSIS — M62838 Other muscle spasm: Secondary | ICD-10-CM | POA: Insufficient documentation

## 2017-09-28 DIAGNOSIS — F17210 Nicotine dependence, cigarettes, uncomplicated: Secondary | ICD-10-CM | POA: Insufficient documentation

## 2017-09-28 DIAGNOSIS — R600 Localized edema: Secondary | ICD-10-CM | POA: Insufficient documentation

## 2017-09-28 MED ORDER — CYCLOBENZAPRINE HCL 10 MG PO TABS
ORAL_TABLET | ORAL | Status: AC
  Filled 2017-09-28: qty 1

## 2017-09-28 MED ORDER — CYCLOBENZAPRINE HCL 10 MG PO TABS
10.0000 mg | ORAL_TABLET | Freq: Once | ORAL | Status: AC
  Administered 2017-09-28: 10 mg via ORAL

## 2017-09-28 MED ORDER — CARISOPRODOL 350 MG PO TABS: 350.0000 mg | ORAL_TABLET | Freq: Three times a day (TID) | ORAL | 0 refills | Status: DC | PRN

## 2017-09-28 NOTE — ED Triage Notes (Signed)
First Nurse Note:  Arrives c/o right lower leg pain and swelling.  Patient has history of vericose veins, so PCP sent patient to ED for evaluation of possible DVT.

## 2017-09-28 NOTE — ED Provider Notes (Signed)
[facility] Emergency Department Provider Note  ____________________________________________   First MD Initiated Contact with Patient 09/28/17 1822     (approximate)  I have reviewed the triage vital signs and the nursing notes.   HISTORY  Chief Complaint Leg Swelling   HPI CARIME DINKEL is a 26 y.o. female with a history of dysmenorrhea migraines and varicose veins the bilateral lower cavities was presenting with several hours of cramping to the left calf as well as swelling.  She says that she stands hours at work and wears flat shoes.  Says that the muscle seem like it was tensing and in a spasm.  She says that it hurts her to dorsiflex at the ankle.  Despite the triage note saying she was sent in by her PCP the patient denies this and says that she came in on her own volition.  Patient denies any history of blood clot.  No long trips lately. Past Medical History:  Diagnosis Date  . Dysmenorrhea   . Migraines     Patient Active Problem List   Diagnosis Date Noted  . Labor and delivery, indication for care 11/02/2016  . Uterine contractions during pregnancy 11/02/2016  . Varicose veins of vulva in third trimester, antepartum 10/07/2016  . Supervision of normal intrauterine pregnancy in multigravida in third trimester 10/07/2016  . 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 Subchorionic hematoma in first trimester, antepartum 05/27/2016  . History of miscarriage, currently pregnant 04/21/2016  . Positive urine drug screen 03/27/2016  . Tobacco user 02/26/2016  . Irregular periods/menstrual cycles 02/26/2016  . Neck pain 02/22/2012  . DIARRHEA 10/25/2008  . ABDOMINAL PAIN, EPIGASTRIC 09/25/2008  . LOW BACK PAIN 08/28/2008  . VASOVAGAL SYNCOPE 08/28/2008    Past Surgical History:  Procedure Laterality Date  . CHOLECYSTECTOMY      Prior to Admission medications   Medication Sig Start Date End Date Taking? Authorizing Provider  buPROPion  (WELLBUTRIN) 100 MG tablet Take 1 tablet (100 mg total) by mouth 2 (two) times daily. 12/08/16   [name], MD  ibuprofen (ADVIL,MOTRIN) 800 MG tablet Take 1 tablet (800 mg total) by mouth every 8 (eight) hours as needed. 11/03/16   [name], MD  Prenat-FeFum-FePo-FA-Omega 3 (CONCEPT DHA) 53.5-38-1 MG CAPS Take 53.5 mg by mouth daily. 02/26/16   Defrancesco, Alanda Slim, MD    Allergies Amoxicillin and Penicillins  Family History  Problem Relation Age of Onset  . Breast cancer Mother   . Ovarian cancer Mother   . Arthritis Unknown   . Diabetes Unknown   . Cancer Unknown   . Diabetes Maternal Grandmother   . Diabetes Maternal Grandfather   . Diabetes Paternal Grandmother   . Diabetes Paternal Grandfather   . Colon cancer Neg Hx   . Heart disease Neg Hx     Social History Social History   Tobacco Use  . Smoking status: Current Every Day Smoker    Packs/day: 0.25    Types: Cigarettes  . Smokeless tobacco: Never Used  Substance Use Topics  . Alcohol use: No  . Drug use: Yes    Types: Marijuana    Comment: last smoked - 01/23/2016- helps with her cramps- smokes weekly    Review of Systems  Constitutional: No fever/chills Eyes: No visual changes. ENT: No sore throat. Cardiovascular: Denies chest pain. Respiratory: Denies shortness of breath. Gastrointestinal: No abdominal pain.  No nausea, no vomiting.  No diarrhea.  No constipation. Genitourinary: Negative for dysuria. Musculoskeletal: Negative  for back pain. Skin: Negative for rash. Neurological: Negative for headaches, focal weakness or numbness.   ____________________________________________   PHYSICAL EXAM:  VITAL SIGNS: ED Triage Vitals  Enc Vitals Group     BP 09/28/17 1731 114/70     Pulse Rate 09/28/17 1731 82     Resp 09/28/17 1731 15     Temp 09/28/17 1731 98.3 F (36.8 C)     Temp Source 09/28/17 1731 Oral     SpO2 09/28/17 1731 99 %     Weight 09/28/17 1732 140 lb (63.5 kg)     Height  09/28/17 1732 5\' 4"  (1.626 m)     Head Circumference --      Peak Flow --      Pain Score 09/28/17 1731 8     Pain Loc --      Pain Edu? --      Excl. in [place]? --     Constitutional: Alert and oriented. Well appearing and in no acute distress. Eyes: Conjunctivae are normal.  Head: Atraumatic. Nose: No congestion/rhinnorhea. Mouth/Throat: Mucous membranes are moist.  Neck: No stridor.   Cardiovascular: Normal rate, regular rhythm. Grossly normal heart sounds.  Good peripheral circulation with bilateral equal dorsalis pedis pulses. Respiratory: Normal respiratory effort.  No retractions. Lungs CTAB. Gastrointestinal: Soft and nontender. No distention. No CVA tenderness. Musculoskeletal:   Multiple small non-engorged varicose veins the bilateral lower extreme is.  Fullness to the left calf but it is soft.  Sensation is intact and the patient is neurovascularly intact distal to the calf.  Swelling is only mild.  However, it is tender and the patient has her foot resting in a plantar flexed position.  Neurologic:  Normal speech and language. No gross focal neurologic deficits are appreciated. Skin:  Skin is warm, dry and intact. No rash noted. Psychiatric: Mood and affect are normal. Speech and behavior are normal.  ____________________________________________   LABS (all labs ordered are listed, but only abnormal results are displayed)  Labs Reviewed - No data to display ____________________________________________  EKG   ____________________________________________  RADIOLOGY  No DVT on the ultrasound of the left lower extremity ____________________________________________   PROCEDURES  Procedure(s) performed:   Procedures  Critical Care performed:   ____________________________________________   INITIAL IMPRESSION / ASSESSMENT AND PLAN / ED COURSE  Pertinent labs & imaging results that were available during my care of the patient were reviewed by me and  considered in my medical decision making (see chart for details).  DDX: DVT, muscle spasm, compartment syndrome  As part of my medical decision making, I reviewed the following data within the [place] Notes from prior office visits  Patient with likely muscle spasm of the calf.  We discussed stretching as well as using ibuprofen as well as salves such as icy hot.  She is understanding of the plan willing to comply.  We also discussed changing her shoes to footwear with a more substantial sole.  She is understanding the plan willing to comply will be discharged home.  Unlikely to be compartment syndrome as the compartments are soft and the patient is neurovascularly intact.  Also without risk for compartment syndrome.       ____________________________________________   FINAL CLINICAL IMPRESSION(S) / ED DIAGNOSES  Muscle spasm.    NEW MEDICATIONS STARTED DURING THIS VISIT:  This SmartLink is deprecated. Use AVSMEDLIST instead to display the medication list for a patient.   Note:  This document was prepared using Dragon voice  recognition software and may include unintentional dictation errors.     Orbie Pyo, MD 09/28/17 1901

## 2017-09-28 NOTE — ED Notes (Signed)
Pt states that she has vericose veins in left calf. Started swelling today around 3:00pm. Family at bedside.

## 2017-09-28 NOTE — ED Triage Notes (Signed)
Patient presents to ED via POV from work with c/o left calf pain, tenderness and swelling. Patient states she thought it was a muscle spasm and tried to work through it but she is unable to touch her calf without being in immense pain.

## 2017-09-29 ENCOUNTER — Other Ambulatory Visit
Admission: RE | Admit: 2017-09-29 | Discharge: 2017-09-29 | Disposition: A | Discharge status: Home / Self Care | Payer: Medicaid Other | Source: Ambulatory Visit | Attending: Family Medicine | Admitting: Family Medicine

## 2017-09-29 DIAGNOSIS — M7989 Other specified soft tissue disorders: Secondary | ICD-10-CM | POA: Insufficient documentation

## 2017-09-29 LAB — FIBRIN DERIVATIVES D-DIMER (ARMC ONLY): Fibrin derivatives D-dimer (ARMC): 139.83 ng/mL (FEU) (ref 0.00–499.00)

## 2018-04-11 ENCOUNTER — Inpatient Hospital Stay: Payer: Medicaid Other

## 2018-04-11 ENCOUNTER — Inpatient Hospital Stay: Payer: Medicaid Other | Attending: Genetic Counselor | Admitting: Genetic Counselor

## 2018-04-11 ENCOUNTER — Encounter: Payer: Self-pay | Admitting: Genetic Counselor

## 2018-04-11 DIAGNOSIS — Z803 Family history of malignant neoplasm of breast: Secondary | ICD-10-CM

## 2018-04-11 NOTE — Progress Notes (Signed)
[facility]      Initial Visit   Patient Name: Isabel [name] Patient DOB: 1991/03/31 Patient Age: 27 y.o. Encounter Date: 04/11/2018  Referring Provider: Self-referred  Primary Care Provider: Casilda Carls, MD  Reason for Visit: Evaluate for hereditary susceptibility to cancer    Assessment and Plan:  . Isabel [name] understood that she has a 50% chance of having inherited the PALB2 mutation in her aunt because it is very likely her mother also has this mutation.    . Testing is recommended to determine whether she has this pathogenic mutation that will impact her screening and risk-reduction for cancer. A negative result will be very reassuring. She was given a copy of the latest NCCN guidelines for women with a PALB2 mutation. We will discuss these in more detail if needed.  . Isabel [name] wished to pursue genetic testing of only the PALB2 gene which is free through [facility]. She does not currently have insurance and did not want to pay for analysis of additional genes. Her blood sample will be sent for analysis of the PALB2 gene to [facility].   Marland Kitchen Results should be available in approximately 2-3 weeks, at which point we will contact her and address implications for her as well as address genetic testing for at-risk family members, if needed.     Dr. Jana [name] was available for questions concerning this case. Total time spent by me in face-to-face counseling was approximately 30 minutes.   _____________________________________________________________________   History of Present Illness: Ms. Isabel [name], a 27 y.o. female, is being seen at the [facility] due to a family history of breast cancer and a recently identified PALB2 mutation in her maternal aunt. She presents to clinic today with her mother and sister to discuss the implications of this information and to pursue genetic testing.  Isabel [name] has no personal  history of cancer.  Her aunt pursued genetic testing due to her diagnosis of triple negative breast cancer. She was found to have a pathogenic mutation in the PALB2 gene called c.1317del (p.[id number]*12). A Variant of Uncertain Significance in CEBPA was also found, but that is not clinically relevant at this time. Isabel [name] mother will be undergoing genetic testing, but very likely has this mutation due to her history of breast cancer at age 20.  Past Medical History:  Diagnosis Date  . Dysmenorrhea   . Family history of breast cancer in mother   . Migraines     Past Surgical History:  Procedure Laterality Date  . CHOLECYSTECTOMY      Family History:  During the visit, a 4-generation pedigree was obtained. Family tree will be scanned in the Media tab in Epic  Significant diagnoses includes breast cancer in her mother at age 86 (she is now 55) and her maternal aunt at age 4.  Her mother had negative BRCA1/BRCA2 testing at diagnosis, and is pursuing additional genetic testing today. Her mother has a brother (age 24) and a sister (age 73) who was recently diagnosed with breast cancer and found to have the pathogenic PALB2 mutation.  Her father (age 63) is cancer-free and had no siblings.  Additionally, Isabel [name] has one sister (age 43) who is also being evaluated today.   Isabel [name] ancestry is Caucasian - NOS. There is no known Jewish ancestry and no consanguinity.  Discussion: We reviewed the characteristics, features and inheritance patterns of hereditary cancer syndromes with a focus on  the PALB2 gene. We discussed her risk of harboring this PALB2 mutation and any other mutation in the context of her personal and family history. We discussed that her small paternal family and paucity of women make risk assessment challenging for that side of the family. We discussed the process of genetic testing, insurance coverage and implications of results: positive, negative and  variant of unknown significance (VUS).   PALB2 Cancer Risks: It is important to note that the studies on cancer risk associated with the PALB2 gene are generally limited. For this reason, the cancer risk estimates below are likely to change as new data is obtained about PALB2 pathogenic mutations. We discussed that pathogenic mutations in PALB2 have been shown to increase the risk of breast cancer to 33-58% by age 45 depending on the research study. PALB2 mutations have also been associated with an increased risk of pancreatic cancerand possibly ovarian cancer, but specific risk estimates are not fully defined yet. Men with a PALB2 mutation may have an increased risk of female breast cancer and prostate cancer, but data is yet preliminary.  We discussed the recommended cancer screenings for women with a PALB2 mutation as outlined in the [employer]  and gave her a copy.   Isabel [name] questions were answered to her satisfaction today and she is welcome to call with any additional questions or concerns. Thank you for the referral and allowing [place] to share in the care of your patient.    Isabel Berg, MS, [place] Certified Genetic Counselor phone: [phone] [email or web address]

## 2018-04-15 IMAGING — US US EXTREM LOW VENOUS*L*
1 series · 13 of 24 positions shown · non-contrast
Comparison: None.

CLINICAL DATA: Left leg pain for several hours



[Series 1: us extrem low venous*left* · 0.07mm/px · 13 of 36 slices shown]
[im 1/36]
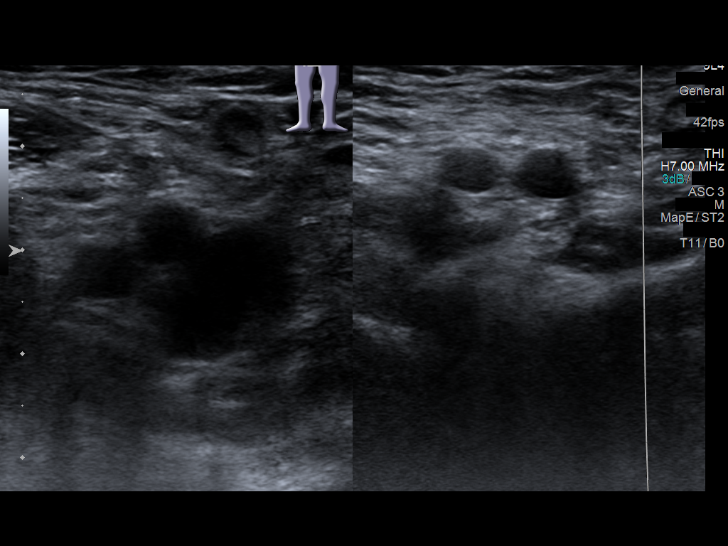
[im 4/36]
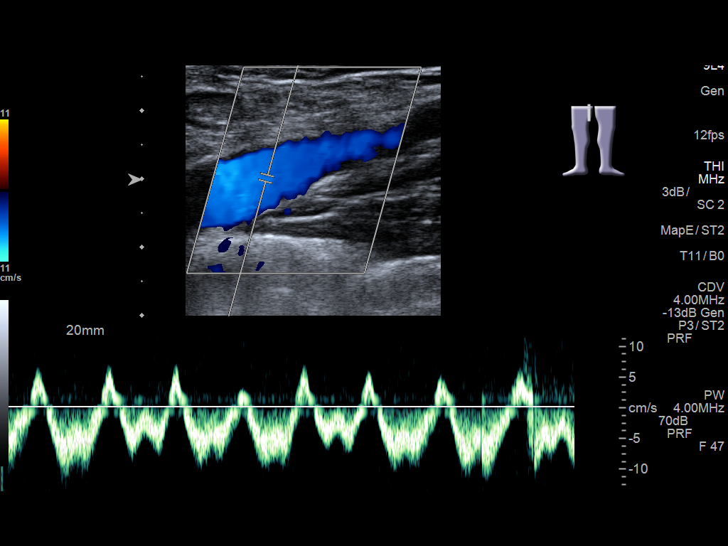
[im 7/36]
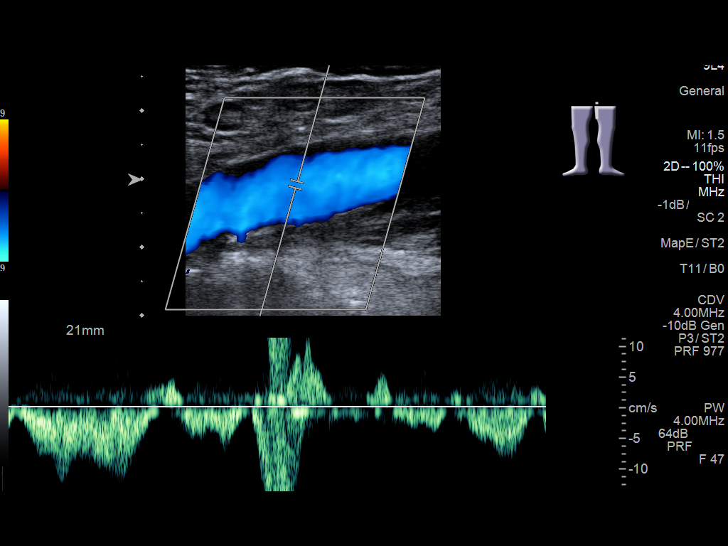
[im 10/36]
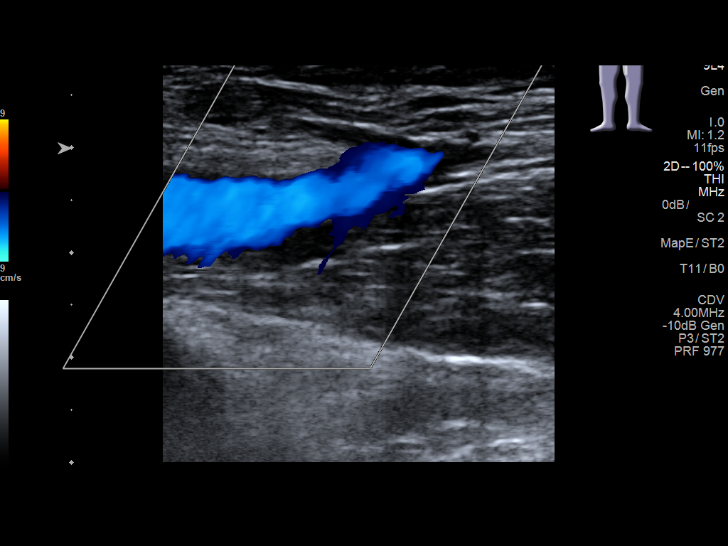
[im 13/36]
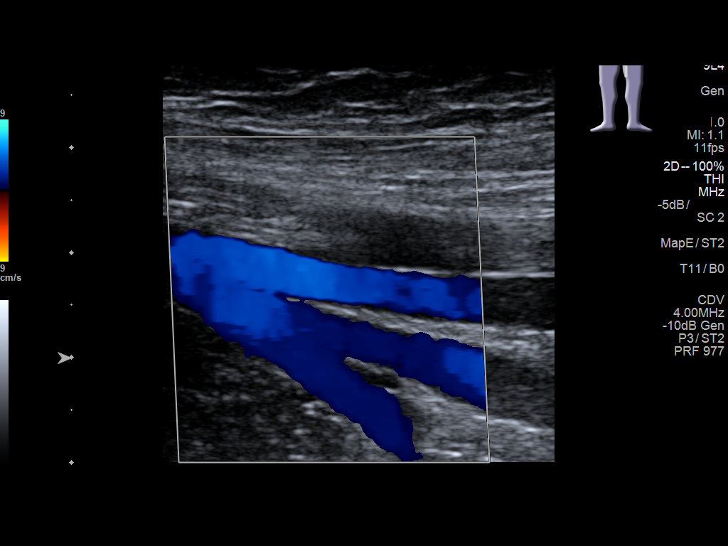
[im 16/36]
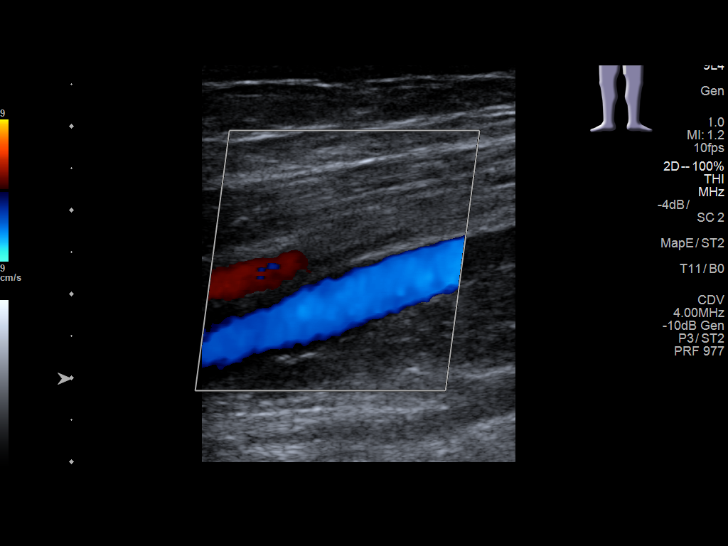
[im 19/36]
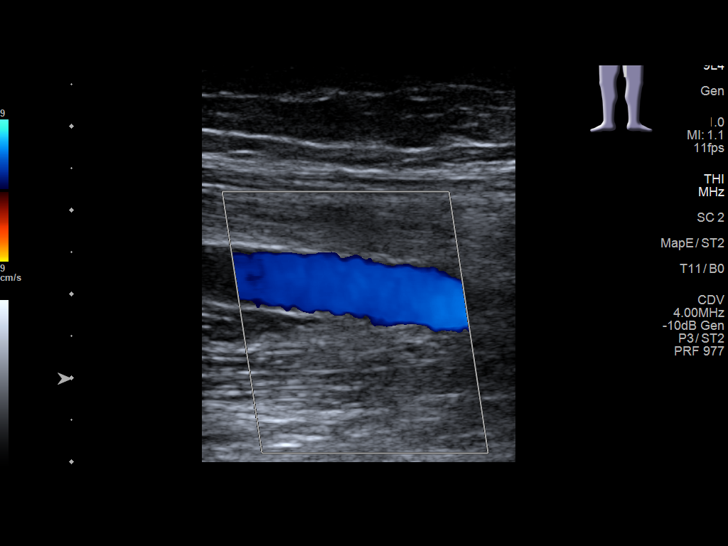
[im 20/36]
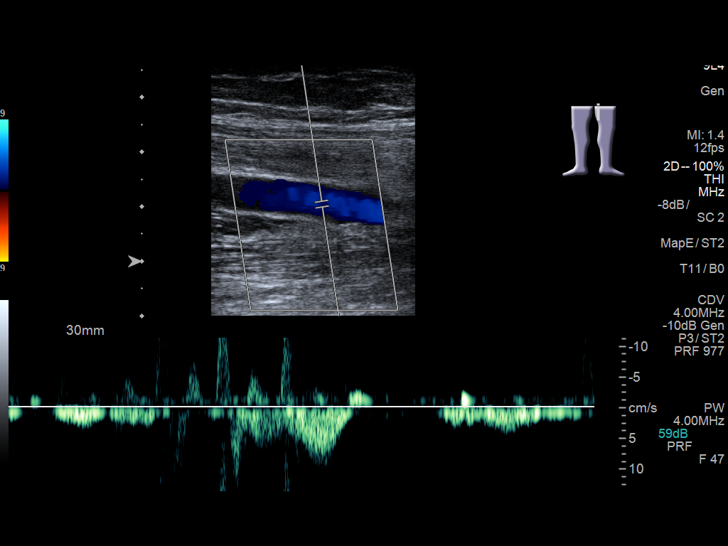
[im 23/36]
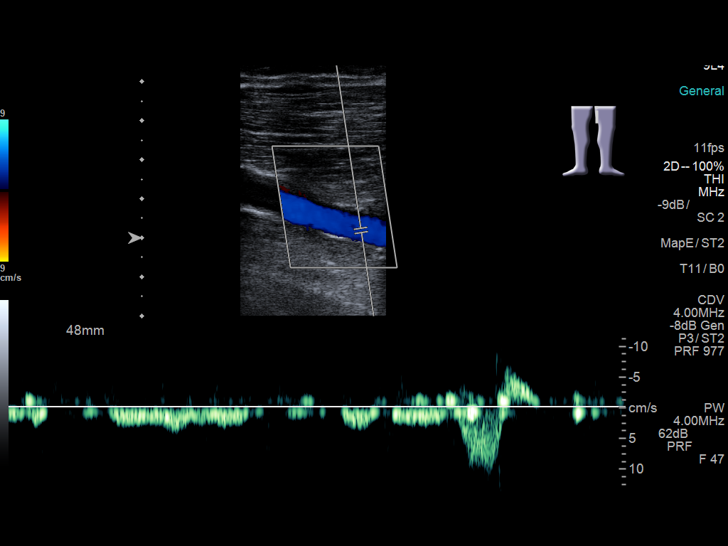
[im 26/36]
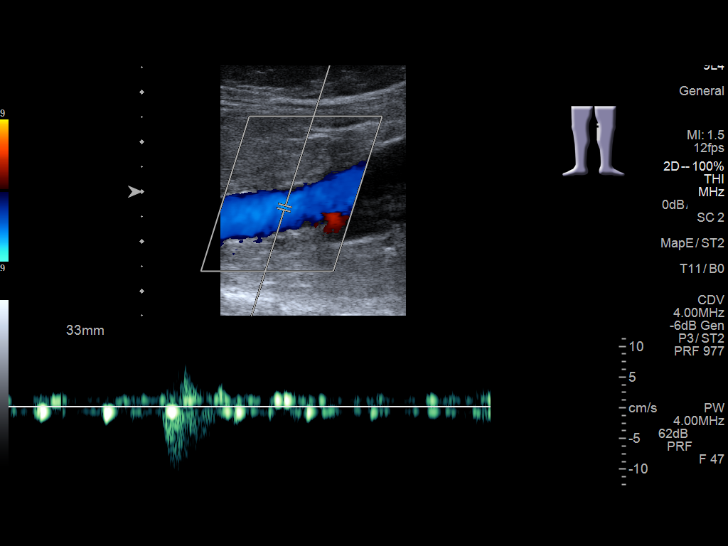
[im 29/36]
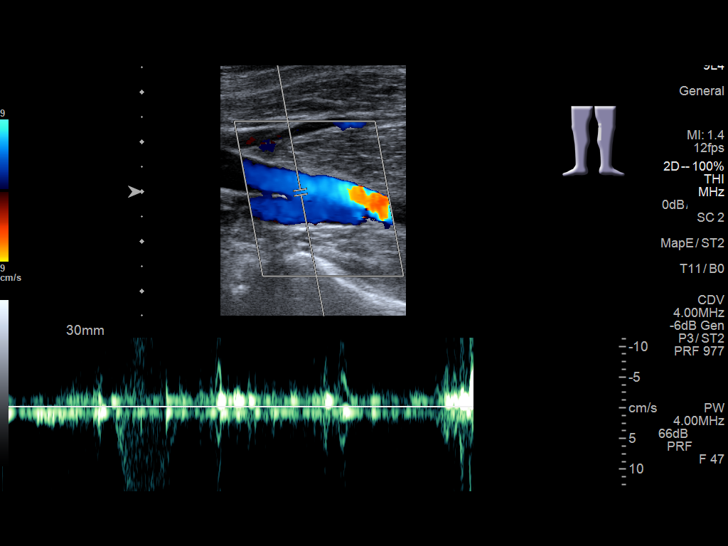
[im 32/36]
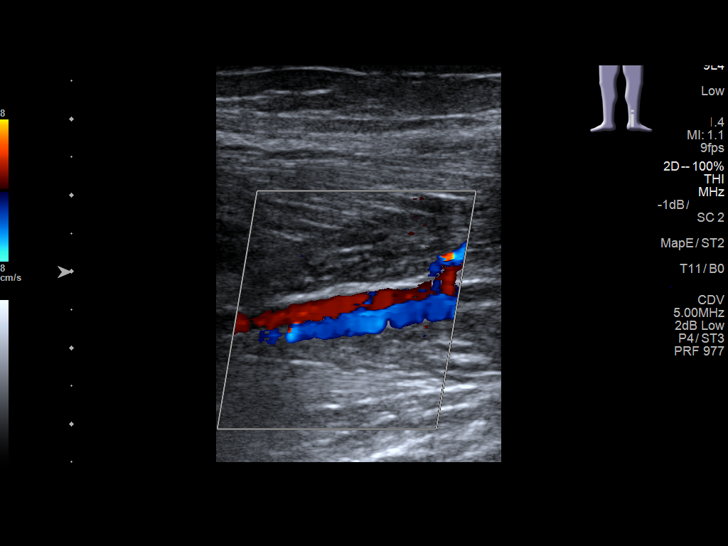
[im 36/36]
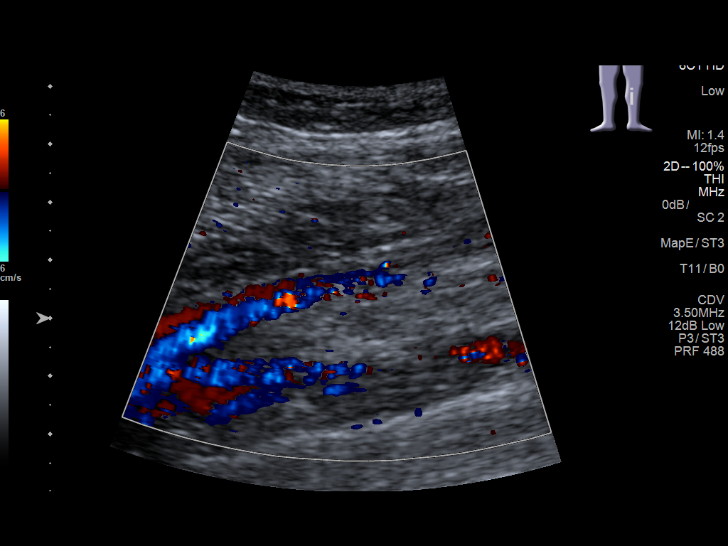

[13 of 24 positions shown; findings below may reference images not displayed]

FINDINGS: Contralateral Common Femoral Vein: Respiratory phasicity is normal
and symmetric with the symptomatic side. No evidence of thrombus.
Normal compressibility.

Common Femoral Vein: No evidence of thrombus. Normal
compressibility, respiratory phasicity and response to augmentation.

Saphenofemoral Junction: No evidence of thrombus. Normal
compressibility and flow on color Doppler imaging.

Profunda Femoral Vein: No evidence of thrombus. Normal
compressibility and flow on color Doppler imaging.

Femoral Vein: No evidence of thrombus. Normal compressibility,
respiratory phasicity and response to augmentation.

Popliteal Vein: No evidence of thrombus. Normal compressibility,
respiratory phasicity and response to augmentation.

Calf Veins: No evidence of thrombus. Normal compressibility and flow
on color Doppler imaging.

Superficial Great Saphenous Vein: No evidence of thrombus. Normal
compressibility.

Venous Reflux:  None.

Other Findings:  None.
IMPRESSION: No evidence of deep venous thrombosis.

## 2018-04-24 ENCOUNTER — Encounter: Payer: Self-pay | Admitting: Genetic Counselor

## 2018-04-24 DIAGNOSIS — Z0000 Encounter for general adult medical examination without abnormal findings: Secondary | ICD-10-CM | POA: Insufficient documentation

## 2018-04-24 DIAGNOSIS — Z1501 Genetic susceptibility to malignant neoplasm of breast: Secondary | ICD-10-CM | POA: Insufficient documentation

## 2018-04-24 DIAGNOSIS — Z1509 Genetic susceptibility to other malignant neoplasm: Secondary | ICD-10-CM

## 2018-04-24 DIAGNOSIS — Z1589 Genetic susceptibility to other disease: Secondary | ICD-10-CM

## 2018-04-24 DIAGNOSIS — Z1379 Encounter for other screening for genetic and chromosomal anomalies: Secondary | ICD-10-CM | POA: Insufficient documentation

## 2018-04-24 NOTE — Progress Notes (Signed)
[facility]         Results Disclosure   Patient Name: Isabel [name] Patient DOB: [birth year] Patient Age: 27 y.o. Encounter Date: 04/25/2018  Referring Provider: Self-referred  Reason for Call: Discuss results of genetic testing   Isabel [name] was seen in the [facility] on 04/11/18 due to a family history of cancer and a recently identified mutation in the [place] gene in her maternal aunt. Please refer to the prior Genetics clinic note for more information regarding Isabel [name]'s medical and family histories and our assessment at the time.   Genetic Test Results: At the time of Isabel [name]'s visit, she chose to pursue genetic testing of only the PALB2 gene to determine whether she inherited the familial mutation. This test included sequencing and deletion/duplication analysis of this gene. 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  A copy of the genetic test report will be scanned into Epic under the media tab.  PALB2 Cancer Risks: It is important to note that the studies on cancer risk associated with the PALB2 gene are generally limited. For this reason, the cancer risk estimates given are likely to change as new data is obtained about PALB2 pathogenic mutations. Pathogenic mutations in PALB2 have been shown to increase the risk of breast cancer to 33-58% by age 7 depending on the research study. PALB2 mutations have also been associated with an increased risk of both ovarian cancer and pancreatic cancer, but specific risk estimates are not fully defined yet. Men with a PALB2 mutation may have an increased risk of female breast cancer and prostate cancer, but data is yet preliminary.    Medical Management: The following are recommendations from the NCCN (Genetic/Familial High-Risk Assessment: Breast and Ovarian V3.2019) that are specific to women with a PALB2 mutation. Because the NCCN  updates these guidelines periodically, clinicians are recommended to view the most recent guidelines at https://www.martin.info/. Isabel [name] mother was diagnosed with breast cancer at age 82 and also has the above mutation. Isabel [name] may wish to initiate breast screenings at age 29 instead of wait until age 42.  Breast Cancer:  - Starting at age 31 or 5-10 years prior to the earliest diagnosis of breast cancer in the family (whichever is earlier): Annual mammogram with consideration of tomosynthesis and breast MRI with contrast. - Option of risk-reducing bilateral mastectomies in consideration of the family history.  Ovarian Cancer: - While the NCCN does not provide specific guidance regarding ovarian cancer risk management for women with a PALB2 mutation, women may wish to discuss the option of risk-reducing bilateral salping-oophorectomy based on their age and other factors.  Pancreatic Cancer: - NCCN does not provide specific guidance regarding pancreatic cancer risk management for women with a PALB2 mutation.  Family Members: It is important that all of Isabel [name]'s relatives (both men and women) know of the presence of this PALB2 gene mutation. Genetic testing can sort out who in the family is at risk and who is not. Her sister has already been evaluated.   If a child inherits two PALB2 mutations, one from each parent, they would have a rare genetic condition called Fanconi Anemia (FA). For this reason, if anyone in the family who has this PALB2 mutation is considering having children or has very young children, they are recommended to have their partner tested. Rather than testing Isabel [name]'s children to determine their genetic status as it relates  to Isabel [name], the father of her children is recommended to pursue testing first.  Support and Resources: If Isabel [name] is interested in information and support, there are two groups, Facing Our Risk (www.facingourrisk.com) and Bright Pink (www.brightpink.org)  which some people have found useful. They provide opportunities to speak with other individuals from high-risk families. To locate genetic counselors in other cities, visit the website of the [employer] (ArtistMovie.se) and Secretary/administrator for a Social worker by zip code.  Isabel [name] is encouraged to remain in contact with Genetics on an annual basis so we can update her personal and family histories, and let her know of advances in cancer genetics that may benefit the family. Isabel [name] questions were answered to her satisfaction today, and she knows she is welcome to call anytime with additional questions.     Steele Berg, MS, [place] Certified Genetic Counselor phone: [phone]

## 2018-04-25 ENCOUNTER — Ambulatory Visit: Payer: Self-pay | Admitting: Genetic Counselor

## 2018-04-25 DIAGNOSIS — Z1509 Genetic susceptibility to other malignant neoplasm: Secondary | ICD-10-CM

## 2018-04-25 DIAGNOSIS — Z1501 Genetic susceptibility to malignant neoplasm of breast: Secondary | ICD-10-CM

## 2018-04-25 DIAGNOSIS — Z1379 Encounter for other screening for genetic and chromosomal anomalies: Secondary | ICD-10-CM

## 2018-04-25 DIAGNOSIS — Z1589 Genetic susceptibility to other disease: Secondary | ICD-10-CM

## 2018-04-25 HISTORY — DX: Genetic susceptibility to other malignant neoplasm: Z15.09

## 2018-04-25 HISTORY — DX: Genetic susceptibility to malignant neoplasm of breast: Z15.01

## 2018-04-25 HISTORY — DX: Encounter for other screening for genetic and chromosomal anomalies: Z13.79

## 2018-06-07 ENCOUNTER — Encounter: Payer: Self-pay | Admitting: Obstetrics and Gynecology

## 2018-06-22 ENCOUNTER — Ambulatory Visit: Payer: Medicaid Other | Admitting: Obstetrics and Gynecology

## 2018-06-22 ENCOUNTER — Encounter: Payer: Self-pay | Admitting: Obstetrics and Gynecology

## 2018-06-22 VITALS — BP 108/74 | HR 125 | Ht 64.0 in | Wt 132.0 lb

## 2018-06-22 DIAGNOSIS — N943 Premenstrual tension syndrome: Secondary | ICD-10-CM

## 2018-06-22 DIAGNOSIS — N6310 Unspecified lump in the right breast, unspecified quadrant: Secondary | ICD-10-CM

## 2018-06-22 DIAGNOSIS — Z803 Family history of malignant neoplasm of breast: Secondary | ICD-10-CM

## 2018-06-22 DIAGNOSIS — N946 Dysmenorrhea, unspecified: Secondary | ICD-10-CM

## 2018-06-22 DIAGNOSIS — N63 Unspecified lump in unspecified breast: Secondary | ICD-10-CM

## 2018-06-22 DIAGNOSIS — N643 Galactorrhea not associated with childbirth: Secondary | ICD-10-CM

## 2018-06-22 MED ORDER — NORETHIN ACE-ETH ESTRAD-FE 1-20 MG-MCG(24) PO CAPS: 1.0000 | ORAL_CAPSULE | Freq: Every day | ORAL | 2 refills | Status: DC

## 2018-06-22 MED ORDER — IBUPROFEN 600 MG PO TABS: 600.0000 mg | ORAL_TABLET | Freq: Four times a day (QID) | ORAL | 3 refills | Status: DC | PRN

## 2018-06-22 NOTE — Progress Notes (Signed)
Pt stated that she noticed pain and leaking from right breast nipple. Pt stated there is also a knot in one area on her right breast.

## 2018-06-23 ENCOUNTER — Encounter: Payer: Self-pay | Admitting: Obstetrics and Gynecology

## 2018-06-23 NOTE — Progress Notes (Signed)
GYNECOLOGY PROGRESS NOTE  Subjective:    Patient ID: Isabel [name], female    DOB: [birth year], 27 y.o.   MRN: [id number]  HPI  Patient is a 27 y.o. [phone] female who presents for complaints of right breast pain and milky discharge from the nipple of her right breast.  She also notes that there was a knot that appeared in the right breast. All of this occurred beginning ~ 3 days ago, and has been ongoing since that time. She notes that the knot has gone down some since then but can still express the milky discharge. She denies tenderness or redness of the breast. 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   In addition, Collier would like to discuss issues with her menstrual cycle. She states that since the birth of her child last year, her menstrual cycles have been causing her issues. She states that approximately 3 days before her cycle she has pelvic pain, becomes nauseated, and becomes swollen in the vaginal region and legs, even sometimes noting varicose veins. She is currently using condoms for contraception. Takes nothing when the pain occurs.   The following portions of the patient's history were reviewed and updated as appropriate: allergies, current medications, past family history, past medical history, past social history, past surgical history and problem list.  Review of Systems Pertinent items noted in HPI and remainder of comprehensive ROS otherwise negative.   Objective:   Blood pressure 108/74, pulse (!) 125, height '5\' 4"'  (1.626 m), weight 132 lb (59.9 kg), last menstrual period 06/15/2018, not currently breastfeeding. General appearance: alert and no distress    Breasts: Right breast with mild nodularity (possible fibrocystic change); no skin or nipple changes or axillary nodes. Small amount of thin milky discharge expressed.  Left breast normal without mass, skin or nipple changes or axillary nodes, no nipple discharge. Abdomen: soft, non-tender; bowel sounds normal; no masses,  no organomegaly Pelvic: cervix normal in appearance, external genitalia normal, no adnexal masses or tenderness, no cervical motion tenderness, rectovaginal septum normal, uterus normal size, shape, and consistency and vagina normal without discharge    Assessment:   Galactorrhea Breast nodularity Family h/o breast cancer (hereditary) PMS syndrome with dysmenorrhea  Plan:   1. Galactorrhea - will order TSH and prolactin.  No palpable ducts noted.  2. Breast nodularity - in conjunction with galactorrhea and family h/o breast cancer, would recommend breast ultrasound, and possible mammogram if warranted.  3. Family h/o breast cancer (herditary) - Discussion had with patient regarding recommendations for screening, as was also given to her at her Genetic counseling visit after her testing. She should initiate mammograms starting at age 27 (46 years less than age of earliest diagnosed cancer in family member),  And consider alternating with MRI after age 57. She can also consider risk-reducing bilateral mastectomy. She has also been previously counseled regarding her risk of ovarian cancer (although no specific risk known with current gene), but may consider risk-reducing BSO based on age and other factors.  4. PMS with dysmenorrhea - discussed management options including use of hormonal therapy with a trial of OCPs with/without the use of NSAIDs. Patient will use  both. Given sample of Taytulla in the office and will prescribe for a 3 month trial. Will also prescribe Ibuprofen 600 mg.   To f/u in 4 months.    A total of 15 minutes were spent face-to-face with the patient  during this encounter and over half of that time dealt with counseling and coordination of care.   [name], MD [facility]

## 2018-06-27 ENCOUNTER — Ambulatory Visit
Admission: RE | Admit: 2018-06-27 | Discharge: 2018-06-27 | Disposition: A | Discharge status: Home / Self Care | Payer: Medicaid Other | Source: Ambulatory Visit | Attending: Obstetrics and Gynecology | Admitting: Obstetrics and Gynecology

## 2018-06-27 ENCOUNTER — Other Ambulatory Visit: Payer: Self-pay | Admitting: Obstetrics and Gynecology

## 2018-06-27 DIAGNOSIS — N63 Unspecified lump in unspecified breast: Secondary | ICD-10-CM | POA: Insufficient documentation

## 2018-06-27 DIAGNOSIS — Z803 Family history of malignant neoplasm of breast: Secondary | ICD-10-CM | POA: Insufficient documentation

## 2018-06-27 DIAGNOSIS — R928 Other abnormal and inconclusive findings on diagnostic imaging of breast: Secondary | ICD-10-CM

## 2018-06-27 DIAGNOSIS — N643 Galactorrhea not associated with childbirth: Secondary | ICD-10-CM

## 2018-06-27 IMAGING — MG MM DIGITAL DIAGNOSTIC BILAT W/ TOMO W/ CAD
8 series · 8 of 24 positions shown · non-contrast
Comparison: Baseline day

CLINICAL DATA: The patient reports spontaneous and expressed clear
and milky discharge from the RIGHT breast only. She notices
discharge when she has a hot shower. Patient is 2 years post partum
and did not breast-feed. Patient also feels an abnormality in the
UPPER INNER QUADRANT of the RIGHT breast. Patient's mother and
maternal aunt were diagnosed with breast cancer. Patient's mother
was diagnosed at age 38. The patient and her family members are
positive for the [V4] mutation.

EXAM:
DIGITAL DIAGNOSTIC BILATERAL MAMMOGRAM WITH CAD AND TOMO
ULTRASOUND RIGHT BREAST

[L MLO synth-2D]
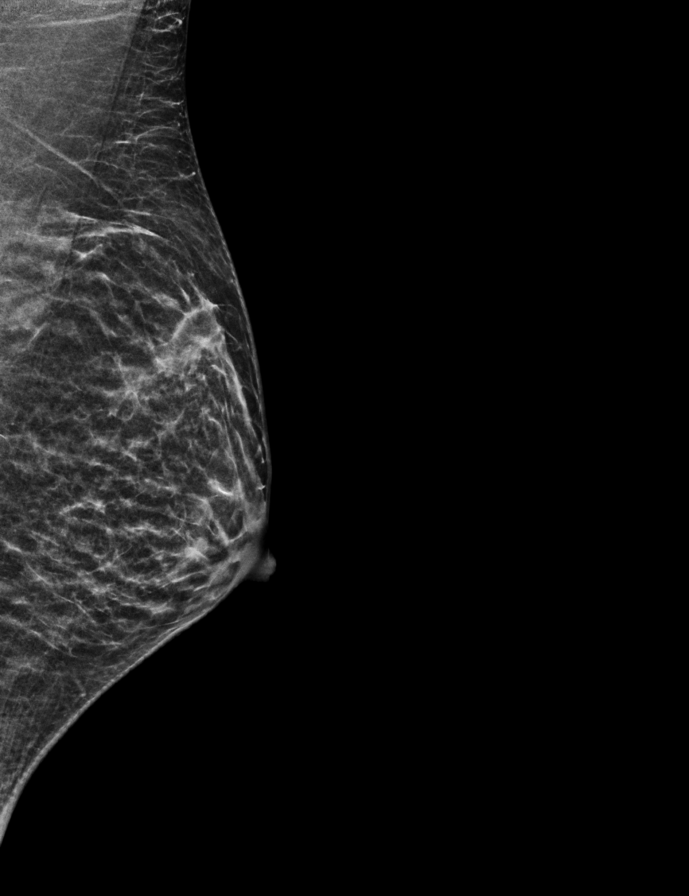

[L CC synth-2D]
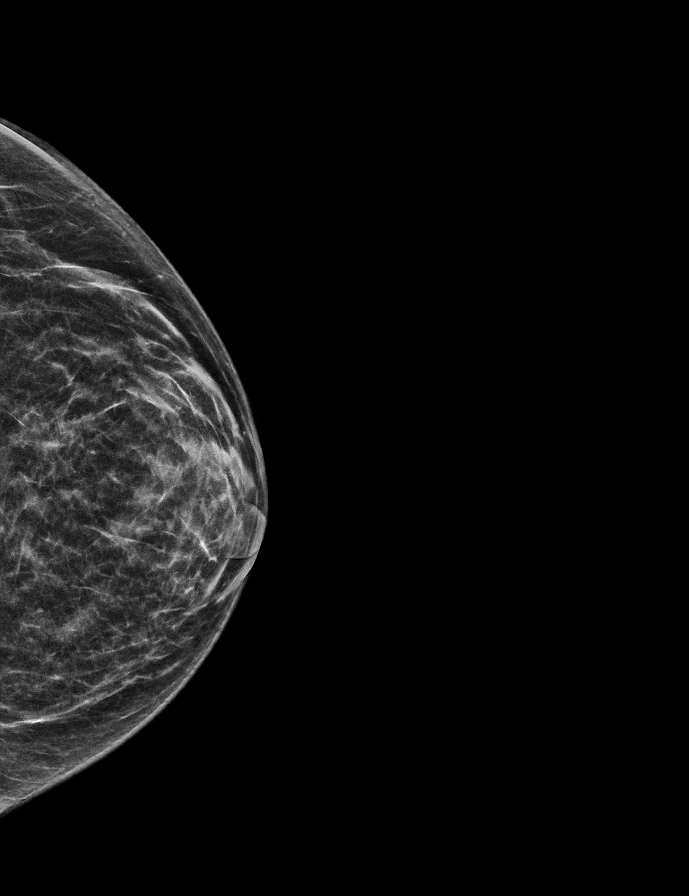

[R CC synth-2D]
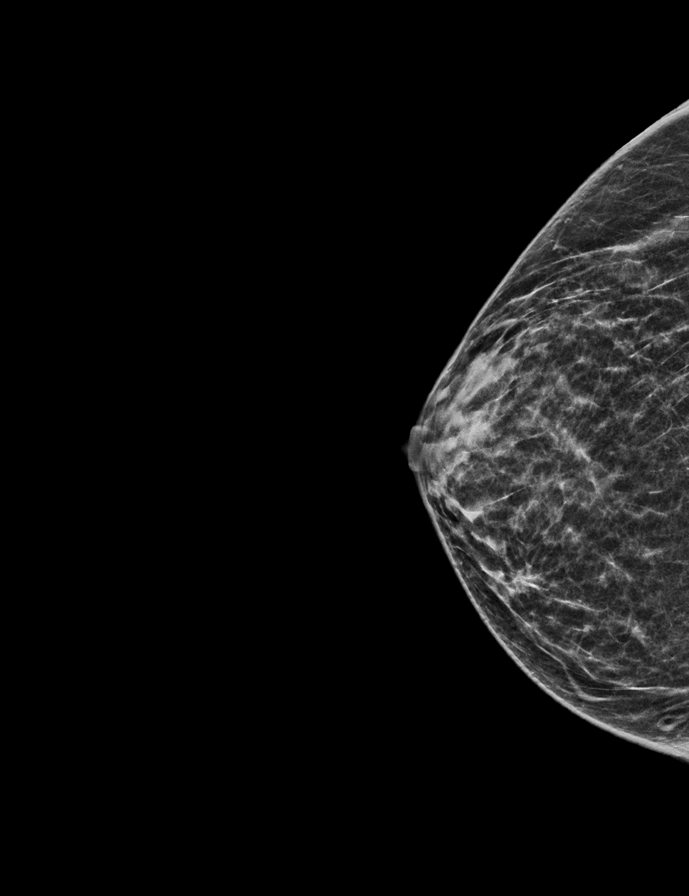

[R MLO synth-2D]
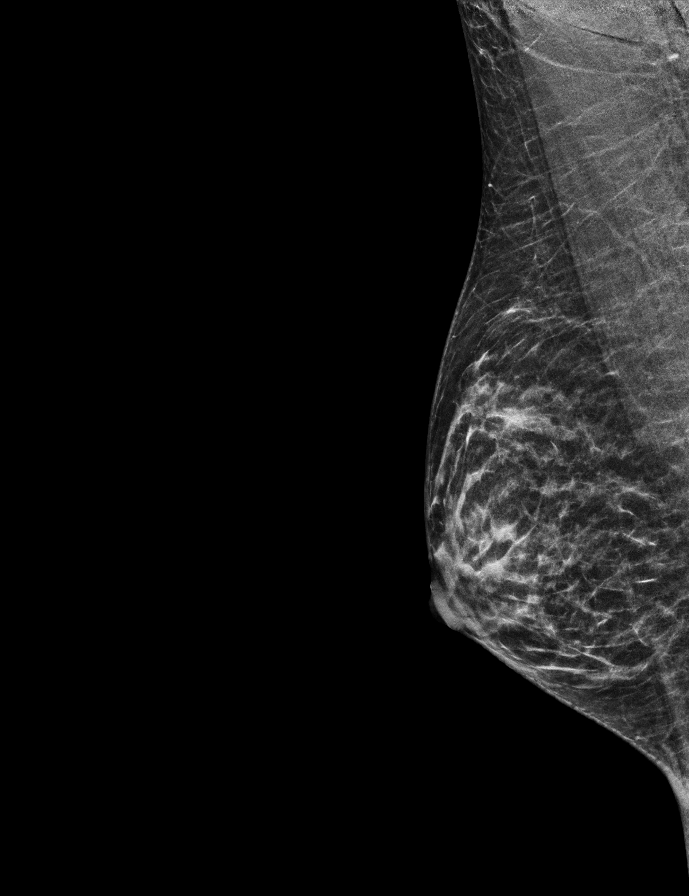

[L CC tomo · tomo slice 20/39.0]
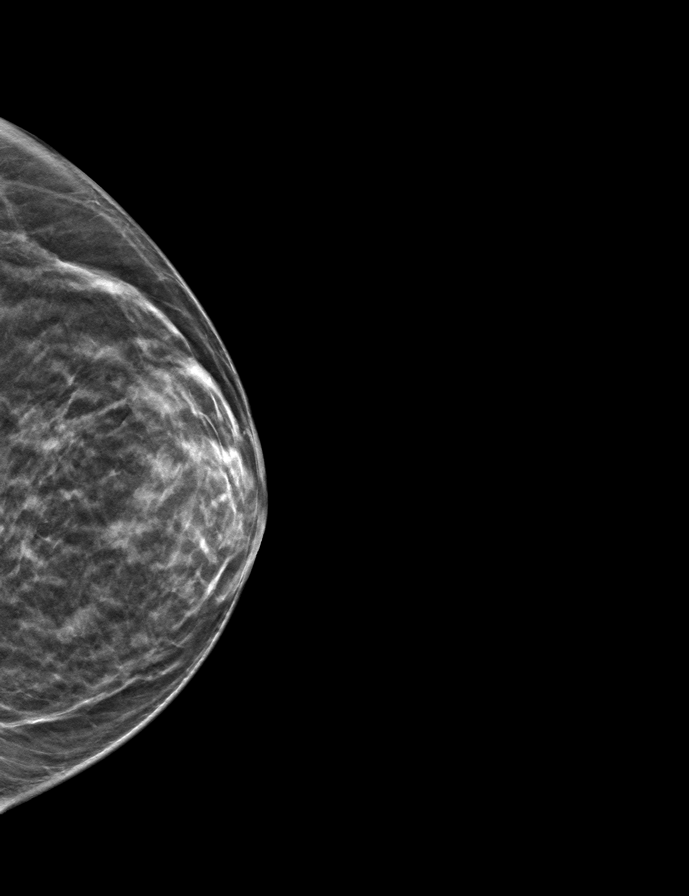

[R CC tomo · tomo slice 17/34.0]
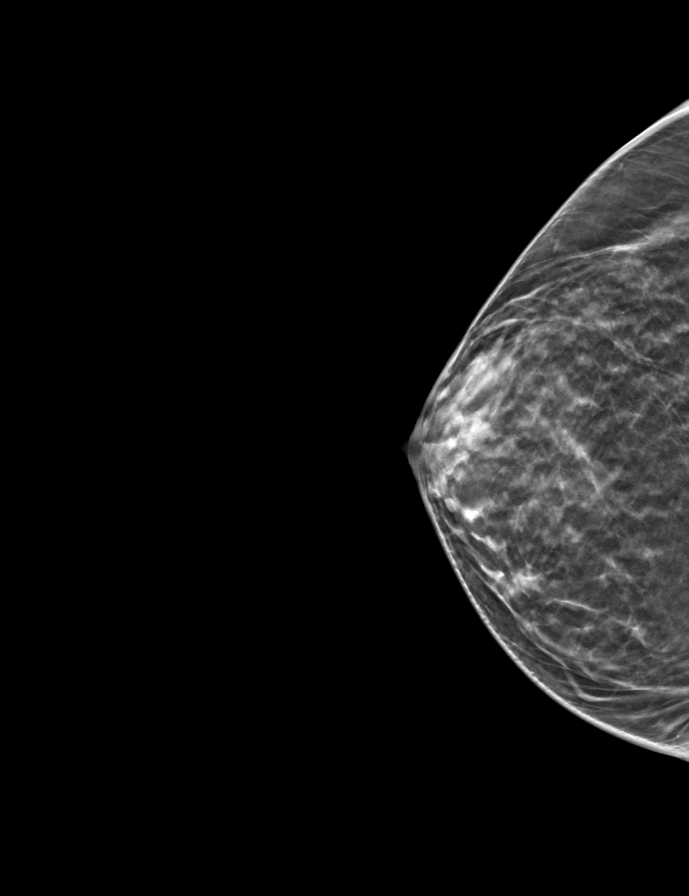

[R MLO tomo · tomo slice 19/38.0]
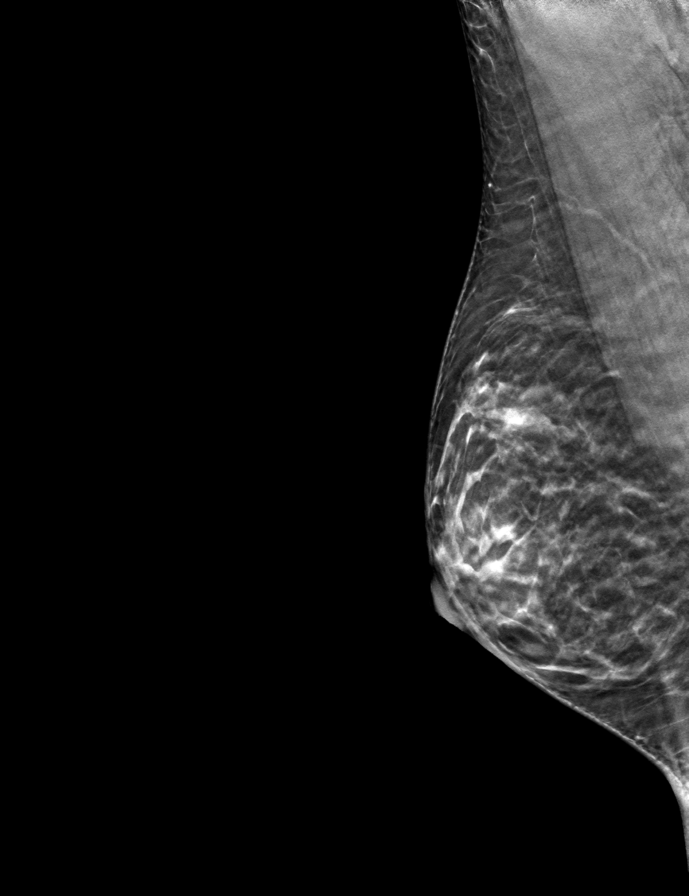

[L MLO tomo · tomo slice 17/34.0]
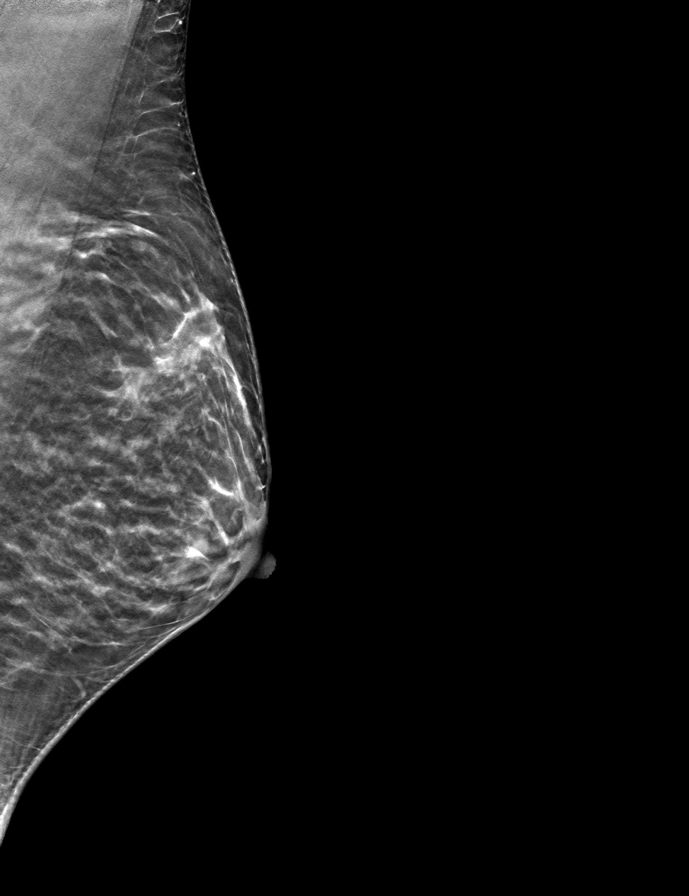

[8 of 24 positions shown; findings below may reference images not displayed]

ACR Breast Density Category c: The breast tissue is heterogeneously
dense, which may obscure small masses.
FINDINGS: No suspicious mass, distortion, or microcalcifications are
identified to suggest presence of malignancy.

Mammographic images were processed with CAD.

On physical exam, I palpate soft thickening in the UPPER INNER
QUADRANT of the RIGHT breast not associated with a discrete mass. I
am able to express clear and milky discharge from numerous RIGHT
nipple ducts.

Targeted ultrasound is performed, showing normal appearing
fibroglandular tissue in the UPPER INNER QUADRANT of the RIGHT
breast. Mildly prominent ducts are identified in the UPPER-OUTER
QUADRANT of the RIGHT breast, not associated with intraductal mass.
The appearance of ductal dilatation in the UPPER-OUTER QUADRANT of
the RIGHT breast is similar to the UPPER-OUTER QUADRANT of the LEFT
breast on comparison scan.
IMPRESSION: 1.  No mammographic or ultrasound evidence for malignancy.
2. Given the multi duct character of the RIGHT nipple discharge, no
further specific evaluation is felt be necessary.

RECOMMENDATION:
Recommend annual screening mammography to begin at age 28 given the
patient's personal and family history.

Based on the recommendations of the American Cancer Society, annual
screening MRI is suggested in addition to annual mammography if the
patient has an estimated lifetime risk of developing breast cancer
which is greater than 20%.

I have discussed the findings and recommendations with the patient.
Results were also provided in writing at the conclusion of the
visit. If applicable, a reminder letter will be sent to the patient
regarding the next appointment.

BI-RADS CATEGORY  1: Negative.

## 2018-06-27 IMAGING — US US BREAST*R* LIMITED INC AXILLA
1 series · 10 of 10 positions shown · non-contrast
Comparison: Baseline day

CLINICAL DATA: The patient reports spontaneous and expressed clear
and milky discharge from the RIGHT breast only. She notices
discharge when she has a hot shower. Patient is 2 years post partum
and did not breast-feed. Patient also feels an abnormality in the
UPPER INNER QUADRANT of the RIGHT breast. Patient's mother and
maternal aunt were diagnosed with breast cancer. Patient's mother
was diagnosed at age 38. The patient and her family members are
positive for the [V4] mutation.

EXAM:
DIGITAL DIAGNOSTIC BILATERAL MAMMOGRAM WITH CAD AND TOMO
ULTRASOUND RIGHT BREAST

[Series 1: us breast*right* limited inc axilla · 0.04mm/px · 10 of 10 slices shown]
[im 1/10]
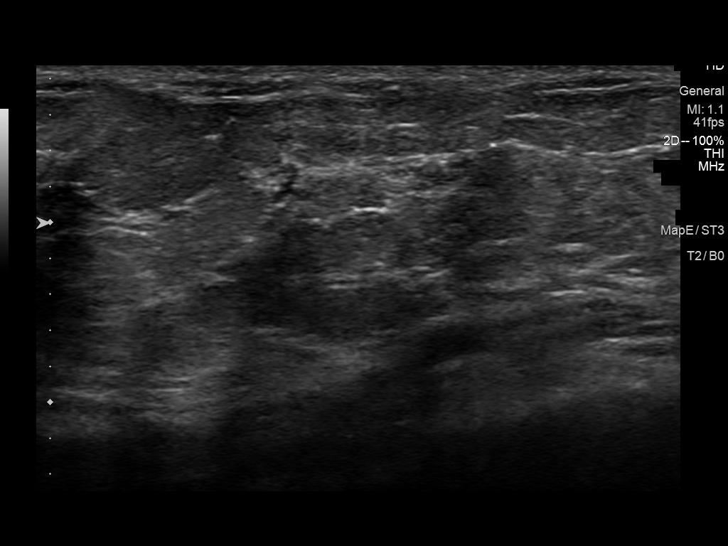
[im 2/10]
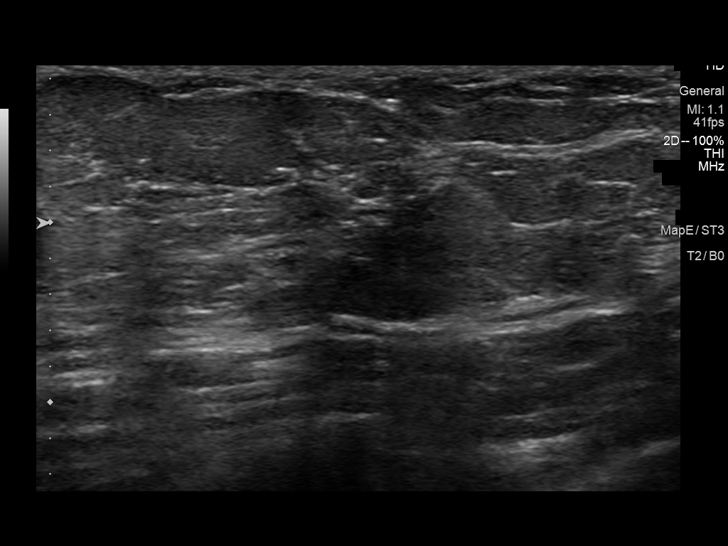
[im 3/10]
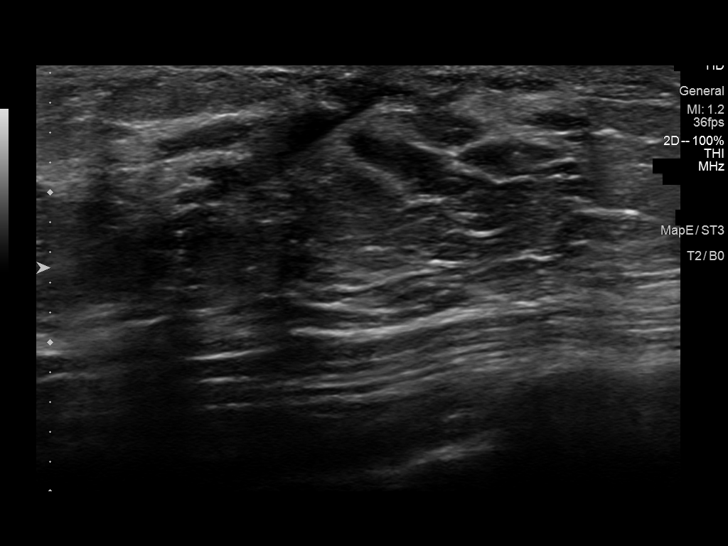
[im 4/10]
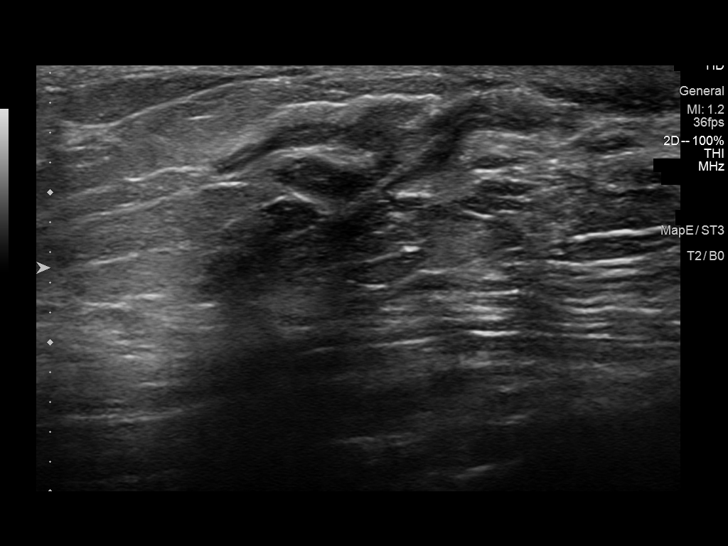
[im 5/10]
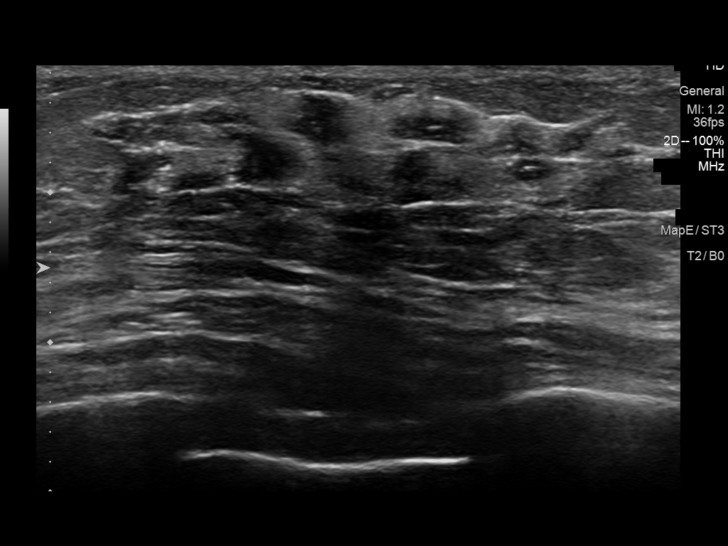
[im 6/10]
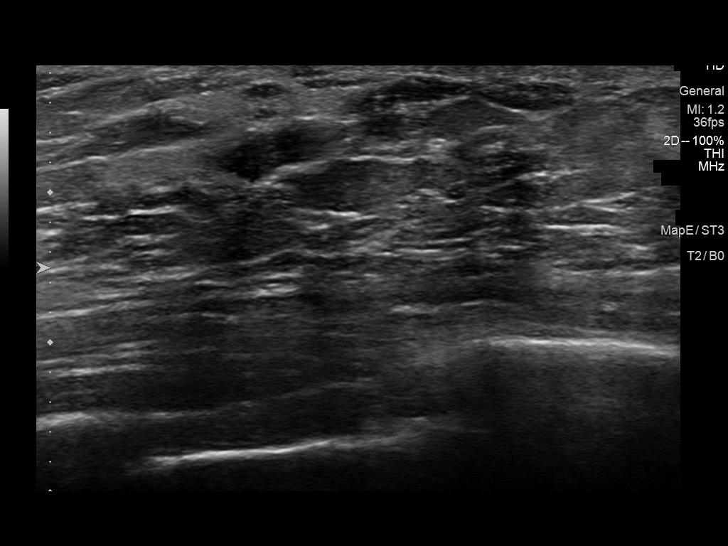
[im 7/10]
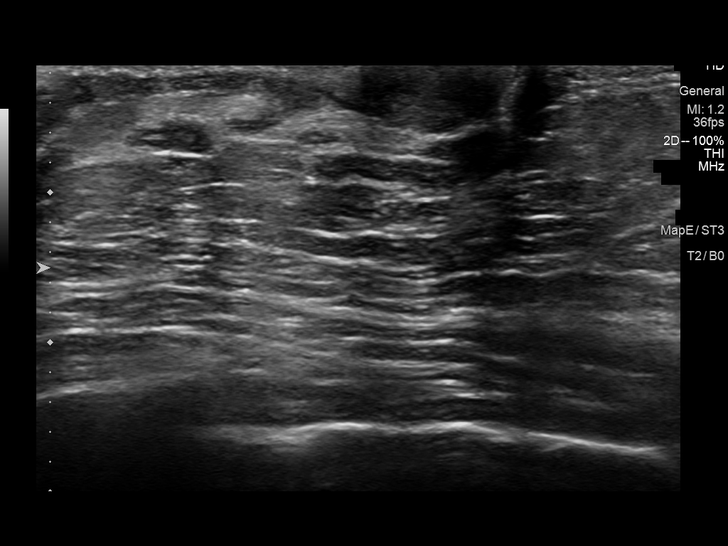
[im 8/10]
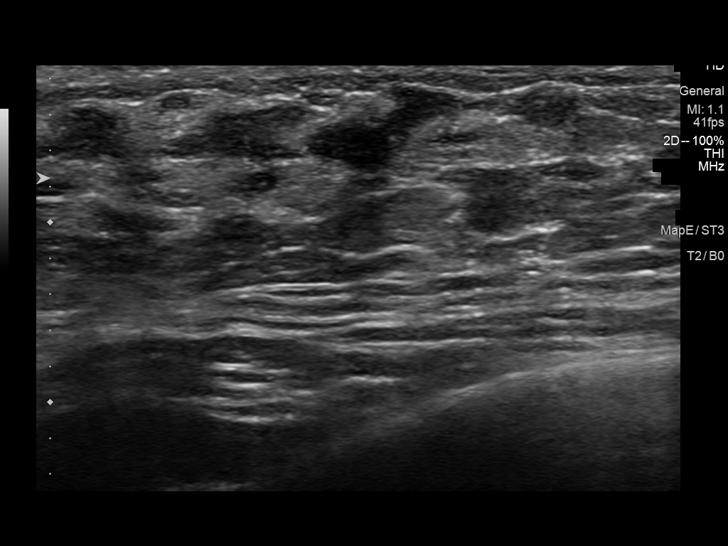
[im 9/10]
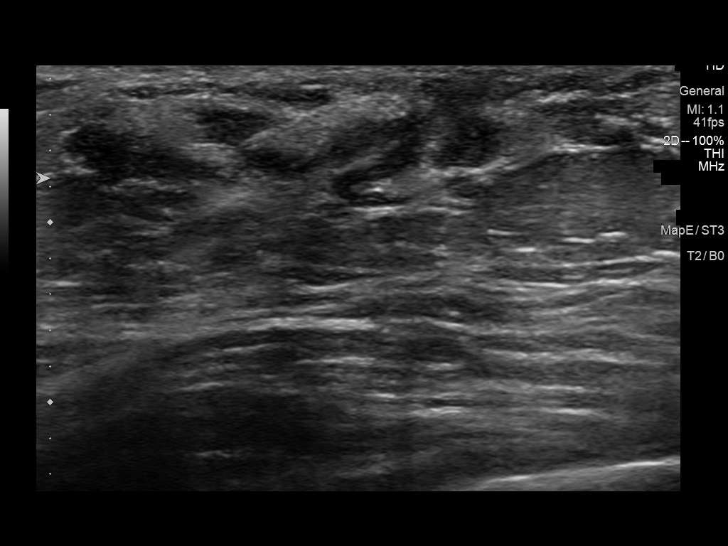
[im 10/10]
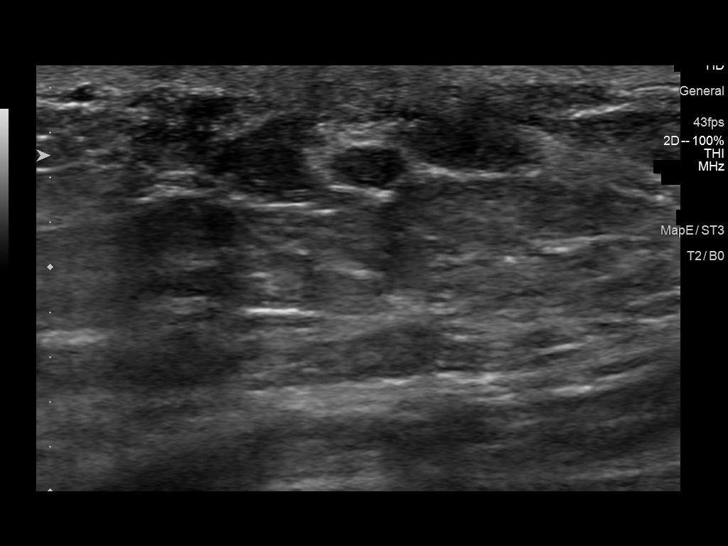

[10 of 10 positions shown; findings below may reference images not displayed]

ACR Breast Density Category c: The breast tissue is heterogeneously
dense, which may obscure small masses.
FINDINGS: No suspicious mass, distortion, or microcalcifications are
identified to suggest presence of malignancy.

Mammographic images were processed with CAD.

On physical exam, I palpate soft thickening in the UPPER INNER
QUADRANT of the RIGHT breast not associated with a discrete mass. I
am able to express clear and milky discharge from numerous RIGHT
nipple ducts.

Targeted ultrasound is performed, showing normal appearing
fibroglandular tissue in the UPPER INNER QUADRANT of the RIGHT
breast. Mildly prominent ducts are identified in the UPPER-OUTER
QUADRANT of the RIGHT breast, not associated with intraductal mass.
The appearance of ductal dilatation in the UPPER-OUTER QUADRANT of
the RIGHT breast is similar to the UPPER-OUTER QUADRANT of the LEFT
breast on comparison scan.
IMPRESSION: 1.  No mammographic or ultrasound evidence for malignancy.
2. Given the multi duct character of the RIGHT nipple discharge, no
further specific evaluation is felt be necessary.

RECOMMENDATION:
Recommend annual screening mammography to begin at age 28 given the
patient's personal and family history.

Based on the recommendations of the American Cancer Society, annual
screening MRI is suggested in addition to annual mammography if the
patient has an estimated lifetime risk of developing breast cancer
which is greater than 20%.

I have discussed the findings and recommendations with the patient.
Results were also provided in writing at the conclusion of the
visit. If applicable, a reminder letter will be sent to the patient
regarding the next appointment.

BI-RADS CATEGORY  1: Negative.

## 2018-06-30 ENCOUNTER — Encounter: Payer: Self-pay | Admitting: Obstetrics and Gynecology

## 2018-07-13 ENCOUNTER — Other Ambulatory Visit (HOSPITAL_COMMUNITY)
Admission: RE | Admit: 2018-07-13 | Discharge: 2018-07-13 | Disposition: A | Discharge status: Home / Self Care | Payer: Medicaid Other | Source: Ambulatory Visit | Attending: Obstetrics and Gynecology | Admitting: Obstetrics and Gynecology

## 2018-07-13 ENCOUNTER — Encounter: Payer: Self-pay | Admitting: Obstetrics and Gynecology

## 2018-07-13 ENCOUNTER — Ambulatory Visit (INDEPENDENT_AMBULATORY_CARE_PROVIDER_SITE_OTHER): Payer: Medicaid Other | Admitting: Obstetrics and Gynecology

## 2018-07-13 VITALS — BP 102/73 | HR 65 | Ht 64.0 in | Wt 136.7 lb

## 2018-07-13 DIAGNOSIS — Z124 Encounter for screening for malignant neoplasm of cervix: Secondary | ICD-10-CM

## 2018-07-13 DIAGNOSIS — Z30430 Encounter for insertion of intrauterine contraceptive device: Secondary | ICD-10-CM

## 2018-07-13 DIAGNOSIS — Z309 Encounter for contraceptive management, unspecified: Secondary | ICD-10-CM

## 2018-07-13 DIAGNOSIS — Z803 Family history of malignant neoplasm of breast: Secondary | ICD-10-CM

## 2018-07-13 DIAGNOSIS — Z1501 Genetic susceptibility to malignant neoplasm of breast: Secondary | ICD-10-CM

## 2018-07-13 DIAGNOSIS — Z01419 Encounter for gynecological examination (general) (routine) without abnormal findings: Secondary | ICD-10-CM

## 2018-07-13 DIAGNOSIS — N943 Premenstrual tension syndrome: Secondary | ICD-10-CM

## 2018-07-13 DIAGNOSIS — Z8742 Personal history of other diseases of the female genital tract: Secondary | ICD-10-CM

## 2018-07-13 DIAGNOSIS — N946 Dysmenorrhea, unspecified: Secondary | ICD-10-CM

## 2018-07-13 NOTE — Patient Instructions (Signed)
Health Maintenance, Female Adopting a healthy lifestyle and getting preventive care can go a long way to promote health and wellness. Talk with your health care provider about what schedule of regular examinations is right for you. This is a good chance for you to check in with your provider about disease prevention and staying healthy. In between checkups, there are plenty of things you can do on your own. Experts have done a lot of research about which lifestyle changes and preventive measures are most likely to keep you healthy. Ask your health care provider for more information. Weight and diet Eat a healthy diet  Be sure to include plenty of vegetables, fruits, low-fat dairy products, and lean protein.  Do not eat a lot of foods high in solid fats, added sugars, or salt.  Get regular exercise. This is one of the most important things you can do for your health. ? Most adults should exercise for at least 150 minutes each week. The exercise should increase your heart rate and make you sweat (moderate-intensity exercise). ? Most adults should also do strengthening exercises at least twice a week. This is in addition to the moderate-intensity exercise.  Maintain a healthy weight  Body mass index (BMI) is a measurement that can be used to identify possible weight problems. It estimates body fat based on height and weight. Your health care provider can help determine your BMI and help you achieve or maintain a healthy weight.  For females 20 years of age and older: ? A BMI below 18.5 is considered underweight. ? A BMI of 18.5 to 24.9 is normal. ? A BMI of 25 to 29.9 is considered overweight. ? A BMI of 30 and above is considered obese.  Watch levels of cholesterol and blood lipids  You should start having your blood tested for lipids and cholesterol at 27 years of age, then have this test every 5 years.  You may need to have your cholesterol levels checked more often if: ? Your lipid or  cholesterol levels are high. ? You are older than 27 years of age. ? You are at high risk for heart disease.  Cancer screening Lung Cancer  Lung cancer screening is recommended for adults 55-80 years old who are at high risk for lung cancer because of a history of smoking.  A yearly low-dose CT scan of the lungs is recommended for people who: ? Currently smoke. ? Have quit within the past 15 years. ? Have at least a 30-pack-year history of smoking. A pack year is smoking an average of one pack of cigarettes a day for 1 year.  Yearly screening should continue until it has been 15 years since you quit.  Yearly screening should stop if you develop a health problem that would prevent you from having lung cancer treatment.  Breast Cancer  Practice breast self-awareness. This means understanding how your breasts normally appear and feel.  It also means doing regular breast self-exams. Let your health care provider know about any changes, no matter how small.  If you are in your 20s or 30s, you should have a clinical breast exam (CBE) by a health care provider every 1-3 years as part of a regular health exam.  If you are 40 or older, have a CBE every year. Also consider having a breast X-ray (mammogram) every year.  If you have a family history of breast cancer, talk to your health care provider about genetic screening.  If you are at high risk   for breast cancer, talk to your health care provider about having an MRI and a mammogram every year.  Breast cancer gene (BRCA) assessment is recommended for women who have family members with BRCA-related cancers. BRCA-related cancers include: ? Breast. ? Ovarian. ? Tubal. ? Peritoneal cancers.  Results of the assessment will determine the need for genetic counseling and BRCA1 and BRCA2 testing.  Cervical Cancer Your health care provider may recommend that you be screened regularly for cancer of the pelvic organs (ovaries, uterus, and  vagina). This screening involves a pelvic examination, including checking for microscopic changes to the surface of your cervix (Pap test). You may be encouraged to have this screening done every 3 years, beginning at age 22.  For women ages 56-65, health care providers may recommend pelvic exams and Pap testing every 3 years, or they may recommend the Pap and pelvic exam, combined with testing for human papilloma virus (HPV), every 5 years. Some types of HPV increase your risk of cervical cancer. Testing for HPV may also be done on women of any age with unclear Pap test results.  Other health care providers may not recommend any screening for nonpregnant women who are considered low risk for pelvic cancer and who do not have symptoms. Ask your health care provider if a screening pelvic exam is right for you.  If you have had past treatment for cervical cancer or a condition that could lead to cancer, you need Pap tests and screening for cancer for at least 20 years after your treatment. If Pap tests have been discontinued, your risk factors (such as having a new sexual partner) need to be reassessed to determine if screening should resume. Some women have medical problems that increase the chance of getting cervical cancer. In these cases, your health care provider may recommend more frequent screening and Pap tests.  Colorectal Cancer  This type of cancer can be detected and often prevented.  Routine colorectal cancer screening usually begins at 27 years of age and continues through 27 years of age.  Your health care provider may recommend screening at an earlier age if you have risk factors for colon cancer.  Your health care provider may also recommend using home test kits to check for hidden blood in the stool.  A small camera at the end of a tube can be used to examine your colon directly (sigmoidoscopy or colonoscopy). This is done to check for the earliest forms of colorectal  cancer.  Routine screening usually begins at age 33.  Direct examination of the colon should be repeated every 5-10 years through 27 years of age. However, you may need to be screened more often if early forms of precancerous polyps or small growths are found.  Skin Cancer  Check your skin from head to toe regularly.  Tell your health care provider about any new moles or changes in moles, especially if there is a change in a mole's shape or color.  Also tell your health care provider if you have a mole that is larger than the size of a pencil eraser.  Always use sunscreen. Apply sunscreen liberally and repeatedly throughout the day.  Protect yourself by wearing long sleeves, pants, a wide-brimmed hat, and sunglasses whenever you are outside.  Heart disease, diabetes, and high blood pressure  High blood pressure causes heart disease and increases the risk of stroke. High blood pressure is more likely to develop in: ? People who have blood pressure in the high end of  the normal range (130-139/85-89 mm Hg). ? People who are overweight or obese. ? People who are African American.  If you are 21-29 years of age, have your blood pressure checked every 3-5 years. If you are 3 years of age or older, have your blood pressure checked every year. You should have your blood pressure measured twice-once when you are at a hospital or clinic, and once when you are not at a hospital or clinic. Record the average of the two measurements. To check your blood pressure when you are not at a hospital or clinic, you can use: ? An automated blood pressure machine at a pharmacy. ? A home blood pressure monitor.  If you are between 17 years and 37 years old, ask your health care provider if you should take aspirin to prevent strokes.  Have regular diabetes screenings. This involves taking a blood sample to check your fasting blood sugar level. ? If you are at a normal weight and have a low risk for diabetes,  have this test once every three years after 27 years of age. ? If you are overweight and have a high risk for diabetes, consider being tested at a younger age or more often. Preventing infection Hepatitis B  If you have a higher risk for hepatitis B, you should be screened for this virus. You are considered at high risk for hepatitis B if: ? You were born in a country where hepatitis B is common. Ask your health care provider which countries are considered high risk. ? Your parents were born in a high-risk country, and you have not been immunized against hepatitis B (hepatitis B vaccine). ? You have HIV or AIDS. ? You use needles to inject street drugs. ? You live with someone who has hepatitis B. ? You have had sex with someone who has hepatitis B. ? You get hemodialysis treatment. ? You take certain medicines for conditions, including cancer, organ transplantation, and autoimmune conditions.  Hepatitis C  Blood testing is recommended for: ? Everyone born from [birth year] through 1965. ? Anyone with known risk factors for hepatitis C.  Sexually transmitted infections (STIs)  You should be screened for sexually transmitted infections (STIs) including gonorrhea and chlamydia if: ? You are sexually active and are younger than 27 years of age. ? You are older than 27 years of age and your health care provider tells you that you are at risk for this type of infection. ? Your sexual activity has changed since you were last screened and you are at an increased risk for chlamydia or gonorrhea. Ask your health care provider if you are at risk.  If you do not have HIV, but are at risk, it may be recommended that you take a prescription medicine daily to prevent HIV infection. This is called pre-exposure prophylaxis (PrEP). You are considered at risk if: ? You are sexually active and do not regularly use condoms or know the HIV status of your partner(s). ? You take drugs by injection. ? You are  sexually active with a partner who has HIV.  Talk with your health care provider about whether you are at high risk of being infected with HIV. If you choose to begin PrEP, you should first be tested for HIV. You should then be tested every 3 months for as long as you are taking PrEP. Pregnancy  If you are premenopausal and you may become pregnant, ask your health care provider about preconception counseling.  If you may become  pregnant, take 400 to 800 micrograms (mcg) of folic acid every day.  If you want to prevent pregnancy, talk to your health care provider about birth control (contraception). Osteoporosis and menopause  Osteoporosis is a disease in which the bones lose minerals and strength with aging. This can result in serious bone fractures. Your risk for osteoporosis can be identified using a bone density scan.  If you are 4 years of age or older, or if you are at risk for osteoporosis and fractures, ask your health care provider if you should be screened.  Ask your health care provider whether you should take a calcium or vitamin D supplement to lower your risk for osteoporosis.  Menopause may have certain physical symptoms and risks.  Hormone replacement therapy may reduce some of these symptoms and risks. Talk to your health care provider about whether hormone replacement therapy is right for you. Follow these instructions at home:  Schedule regular health, dental, and eye exams.  Stay current with your immunizations.  Do not use any tobacco products including cigarettes, chewing tobacco, or electronic cigarettes.  If you are pregnant, do not drink alcohol.  If you are breastfeeding, limit how much and how often you drink alcohol.  Limit alcohol intake to no more than 1 drink per day for nonpregnant women. One drink equals 12 ounces of beer, 5 ounces of wine, or 1 ounces of hard liquor.  Do not use street drugs.  Do not share needles.  Ask your health care  provider for help if you need support or information about quitting drugs.  Tell your health care provider if you often feel depressed.  Tell your health care provider if you have ever been abused or do not feel safe at home. This information is not intended to replace advice given to you by your health care provider. Make sure you discuss any questions you have with your health care provider. Document Released: 05/11/2011 Document Revised: 04/02/2016 Document Reviewed: 07/30/2015 Elsevier Interactive Patient Education  2018 [employer].    Intrauterine Device Insertion, Care After This sheet gives you information about how to care for yourself after your procedure. Your health care provider may also give you more specific instructions. If you have problems or questions, contact your health care provider. What can I expect after the procedure? After the procedure, it is common to have: Cramps and pain in the abdomen. Light bleeding (spotting) or heavier bleeding that is like your menstrual period. This may last for up to a few days. Lower back pain. Dizziness. Headaches. Nausea.  Follow these instructions at home: Before resuming sexual activity, check to make sure that you can feel the IUD string(s). You should be able to feel the end of the string(s) below the opening of your cervix. If your IUD string is in place, you may resume sexual activity. If you had a hormonal IUD inserted more than 7 days after your most recent period started, you will need to use a backup method of birth control for 7 days after IUD insertion. Ask your health care provider whether this applies to you. Continue to check that the IUD is still in place by feeling for the string(s) after every menstrual period, or once a month. Take over-the-counter and prescription medicines only as told by your health care provider. Do not drive or use heavy machinery while taking prescription pain medicine. Keep all follow-up  visits as told by your health care provider. This is important. Contact a health care  provider if: You have bleeding that is heavier or lasts longer than a normal menstrual cycle. You have a fever. You have cramps or abdominal pain that get worse or do not get better with medicine. You develop abdominal pain that is new or is not in the same area of earlier cramping and pain. You feel lightheaded or weak. You have abnormal or bad-smelling discharge from your vagina. You have pain during sexual activity. You have any of the following problems with your IUD string(s): The string bothers or hurts you or your sexual partner. You cannot feel the string. The string has gotten longer. You can feel the IUD in your vagina. You think you may be pregnant, or you miss your menstrual period. You think you may have an STI (sexually transmitted infection). Get help right away if: You have flu-like symptoms. You have a fever and chills. You can feel that your IUD has slipped out of place. Summary After the procedure, it is common to have cramps and pain in the abdomen. It is also common to have light bleeding (spotting) or heavier bleeding that is like your menstrual period. Continue to check that the IUD is still in place by feeling for the string(s) after every menstrual period, or once a month. Keep all follow-up visits as told by your health care provider. This is important. Contact your health care provider if you have problems with your IUD string(s), such as the string getting longer or bothering you or your sexual partner. This information is not intended to replace advice given to you by your health care provider. Make sure you discuss any questions you have with your health care provider. Document Released: 06/24/2011 Document Revised: 09/16/2016 Document Reviewed: 09/16/2016 Elsevier Interactive Patient Education  2017 [employer].

## 2018-07-13 NOTE — Progress Notes (Signed)
PT is present today for an annual exam. Pt stated that she is having a lot of issues with her cycle other than that pt stated that she is doing well no complaints.

## 2018-07-13 NOTE — Progress Notes (Signed)
GYNECOLOGY ANNUAL PHYSICAL EXAM PROGRESS NOTE  Subjective:    Isabel [name] is a 27 y.o. [phone] female who presents for an annual exam. The patient has no complaints today. The patient is sexually active. The patient wears seatbelts: yes. The patient participates in regular exercise: no. Has the patient ever been transfused or tattooed?: no. The patient reports that there is not domestic violence in her life.   The patient has the following complaints today:  1. Still noting that her cycles are painful and slightly heavy.  She was given a trial of OCPs last visit ([place]) and prescribed Ibuprofen, however notes that her cycles have not improved much. Still noting back pain, moderate, that causes her to miss some days of work and cycles are still moderately heavy, lasting 5-6 days. Also still noting PMS symptoms such as mood changes and nausea/vomiting.  Gynecologic History Menarche age: 28 Patient's last menstrual period was 07/13/2018 (exact date). Contraception: condoms History of STI's: Denies Last Pap: 04/21/2016. Results were: abnormal (LGSIL).  Denies prior h/o abnormal pap smears. Last mammogram: 06/27/2018 (for nipple discharge and palpable breast lump in right breast). Results were: normal   OB History  Gravida Para Term Preterm AB Living  '4 2 2 ' 0 2 1  SAB TAB Ectopic Multiple Live Births  2 0 0 0 1    # Outcome Date GA Lbr Len/2nd Weight Sex Delivery Anes PTL Lv  4 Term 11/02/16 [redacted]w[redacted]d/ 00:09 6 lb 8.4 oz (2.96 kg) M Vag-Spont None  LIV     Name: Kmetz,BOY Hadlee     Apgar1: 8  Apgar5: 9  3 SAB 2016 [id number]       2 SAB 2016 [id number]      1 Term 2011 [id number] 1.8 oz (3.225 kg) F Vag-Spont EPI  LIV    Past Medical History:  Diagnosis Date  . Dysmenorrhea   . Family history of breast cancer in mother   . Genetic testing 04/25/18   PALB2 analysis @ [facility] - Familial pathogenic PALB2 mutation detected  . Migraines   . 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 CHOLECYSTECTOMY      Family History  Problem Relation Age of Onset  . Breast cancer Mother 39  84   currently 48  28Arthritis Unknown   . Diabetes Unknown   . Cancer Unknown   . Diabetes Maternal Grandmother   . Diabetes Maternal Grandfather   . Diabetes Paternal Grandmother   . Diabetes Paternal Grandfather   . Breast cancer Maternal Aunt 50  28   currently 50; 79LB2 mutation  . Colon cancer Neg Hx   . Heart disease Neg Hx     Social History   Socioeconomic History  . Marital status: Single    Spouse name: Not on file  . Number of children: Not on file  . Years of education: 12  42Highest education level: Not on file  Occupational History  . Not on file  Social Needs  . Financial resource strain: Not on file  . Food insecurity:    Worry: Not on file    Inability: Not on file  . Transportation needs:    Medical: Not on file    Non-medical: Not on file  Tobacco Use  . Smoking status: Current Every Day  Smoker    Packs/day: 0.25    Types: Cigarettes  . Smokeless tobacco: Never Used  Substance and Sexual Activity  . Alcohol use: No  . Drug use: Yes    Types: Marijuana    Comment: last smoked - 01/23/2016- helps with her cramps- smokes weekly  . Sexual activity: Yes    Birth control/protection: None, Condom, Pill    Comment: Husband plans vasectomy   Lifestyle  . Physical activity:    Days per week: Not on file    Minutes per session: Not on file  . Stress: Not on file  Relationships  . Social connections:    Talks on phone: Not on file    Gets together: Not on file    Attends religious service: Not on file    Active member of club or organization: Not on file    Attends meetings of clubs or organizations: Not on file    Relationship status: Not on file  . Intimate partner violence:    Fear of current or ex partner: Not on file     Emotionally abused: Not on file    Physically abused: Not on file    Forced sexual activity: Not on file  Other Topics Concern  . Not on file  Social History Narrative  . Not on file    Current Outpatient Medications on File Prior to Visit  Medication Sig Dispense Refill  . ibuprofen (ADVIL,MOTRIN) 600 MG tablet Take 1 tablet (600 mg total) by mouth every 6 (six) hours as needed. 60 tablet 3  . Norethin Ace-Eth Estrad-FE (TAYTULLA) 1-20 MG-MCG(24) CAPS Take 1 tablet by mouth daily. 28 capsule 2   No current facility-administered medications on file prior to visit.     Allergies  Allergen Reactions  . Amoxicillin Hives  . Penicillins Hives      Review of Systems Constitutional: negative for chills, fatigue, fevers and sweats Eyes: negative for irritation, redness and visual disturbance Ears, nose, mouth, throat, and face: negative for hearing loss, nasal congestion, snoring and tinnitus Respiratory: negative for asthma, cough, sputum Cardiovascular: negative for chest pain, dyspnea, exertional chest pressure/discomfort, irregular heart beat, palpitations and syncope Gastrointestinal: negative for abdominal pain, change in bowel habits, nausea and vomiting Genitourinary: negative for abnormal menstrual periods, genital lesions, sexual problems and vaginal discharge, dysuria and urinary incontinence Integument/breast: negative for breast lump, breast tenderness and nipple discharge Hematologic/lymphatic: negative for bleeding and easy bruising Musculoskeletal:negative for back pain and muscle weakness Neurological: negative for dizziness, headaches, vertigo and weakness Endocrine: negative for diabetic symptoms including polydipsia, polyuria and skin dryness Allergic/Immunologic: negative for hay fever and urticaria        Objective:  Blood pressure 102/73, pulse 65, height '5\' 4"'  (1.626 m), weight 136 lb 11.2 oz (62 kg), last menstrual period 06/15/2018, not currently  breastfeeding. Body mass index is 23.46 kg/m.  General Appearance:    Alert, cooperative, no distress, appears stated age  Head:    Normocephalic, without obvious abnormality, atraumatic  Eyes:    PERRL, conjunctiva/corneas clear, EOM's intact, both eyes  Ears:    Normal external ear canals, both ears  Nose:   Nares normal, septum midline, mucosa normal, no drainage or sinus tenderness  Throat:   Lips, mucosa, and tongue normal; teeth and gums normal  Neck:   Supple, symmetrical, trachea midline, no adenopathy; thyroid: no enlargement/tenderness/nodules; no carotid bruit or JVD  Back:     Symmetric, no curvature, ROM normal, no CVA tenderness  Lungs:  Clear to auscultation bilaterally, respirations unlabored  Chest Wall:    No tenderness or deformity   Heart:    Regular rate and rhythm, S1 and S2 normal, no murmur, rub or gallop  Breast Exam:    No tenderness, masses, or nipple abnormality  Abdomen:     Soft, non-tender, bowel sounds active all four quadrants, no masses, no organomegaly.    Genitalia:    Pelvic:external genitalia normal, vagina without lesions, or tenderness.  Small amount of blood in vaginal vault. rectovaginal septum  normal. Cervix normal in appearance, no cervical motion tenderness, no adnexal masses or tenderness.  Uterus normal size, shape, mobile, regular contours, nontender.  Rectal:    Normal external sphincter.  No hemorrhoids appreciated. Internal exam not done.   Extremities:   Extremities normal, atraumatic, no cyanosis or edema  Pulses:   2+ and symmetric all extremities  Skin:   Skin color, texture, turgor normal, no rashes or lesions  Lymph nodes:   Cervical, supraclavicular, and axillary nodes normal  Neurologic:   CNII-XII intact, normal strength, sensation and reflexes throughout   .  Labs:  Lab Results  Component Value Date   WBC 16.7 (H) 11/03/2016   HGB 11.5 (L) 11/03/2016   HCT 32.8 (L) 11/03/2016   MCV 93.8 11/03/2016   PLT 212 11/03/2016      Lab Results  Component Value Date   CREATININE 0.89 12/02/2014   BUN 7 12/02/2014   NA 139 12/02/2014   K 4.1 12/02/2014   CL 106 12/02/2014   CO2 27 12/02/2014    Lab Results  Component Value Date   ALT 18 12/02/2014   AST 13 (L) 12/02/2014   ALKPHOS 75 12/02/2014   BILITOT 0.5 12/02/2014    No results found for: TSH   Assessment:   Routine gynecologic exam.  Contraception management Dysmenorrhea Heavy menses PMS symptoms Family history of cancer with positive genetic screen H/o abnormal pap smear  Plan:    - Blood tests: Basic metabolic panel and Comprehensive metabolic panel. - Breast self exam technique reviewed and patient encouraged to perform self-exam monthly. - Contraception: condoms currently but recently had a trial of OCPs which has not helped her cycle. Discussed all methods of contraception, more specifically LARC and continuous regimens. Patient willing to try IUD. Inserted today (see procedure note below). No UPT done as patient just started cycle today.  - Heavy menses with dysmenorrhea and PMS - discussed attempting to decrease or eliminate cycles by use of other contraceptive methods such as an IUD or continuous OCPs.  Patient would like to try IUD. Inserted today (see below). If patient still with significant symptoms, may need further evaluation or workup for conditions like endometriosis.  - Discussed healthy lifestyle modifications. - Pap smear performed today. H/o abnormal pap smear x 1.  - Family h/o breast cancer (herditary) - Previous discussion had with patient regarding recommendations for screening, as was also given to her at her genetic counseling visit after her testing. She should initiate mammograms starting at age 64 (30 years less than age of earliest diagnosed cancer in family member),  And also alternating with MRI every 6 weeks. She can also consider risk-reducing bilateral mastectomy. She has also been previously counseled  regarding her risk of ovarian cancer (although no specific risk known with current gene), but may consider risk-reducing BSO based on age and other factors.  - Follow up in 4 weeks for IUD string check. In 1 year for annual exam.  GYNECOLOGY OFFICE PROCEDURE NOTE  WILSON SAMPLE is a 27 y.o. [phone] here for [place] IUD insertion.  Last pap smear was on 07/2016 and was normal.  IUD Insertion Procedure Note Patient identified, informed consent performed, consent signed.   Discussed risks of irregular bleeding, cramping, infection, malpositioning or misplacement of the IUD outside the uterus which may require further procedure such as laparoscopy. Time out was performed.  Urine pregnancy test not performed as patient on menstrual cycle starting today.  Speculum placed in the vagina.  Cervix visualized.  Cleaned with Betadine x 2.  Grasped anteriorly with a single tooth tenaculum.  Uterus sounded to 7 cm.  Liletta IUD placed per manufacturer's recommendations.  Strings trimmed to 3 cm. Tenaculum was removed, good hemostasis noted.  Patient tolerated procedure well.   Patient was given post-procedure instructions.  She was advised to have backup contraception for one week.  Patient was also asked to check IUD strings periodically and follow up in 4 weeks for IUD check.    [name], MD [facility]

## 2018-07-15 LAB — CYTOLOGY - PAP: Diagnosis: NEGATIVE

## 2018-07-20 ENCOUNTER — Other Ambulatory Visit: Payer: Medicaid Other

## 2018-07-21 LAB — COMPREHENSIVE METABOLIC PANEL
ALT: 8 IU/L (ref 0–32)
AST: 7 IU/L (ref 0–40)
Albumin/Globulin Ratio: 1.7 (ref 1.2–2.2)
Albumin: 4.1 g/dL (ref 3.5–5.5)
Alkaline Phosphatase: 54 IU/L (ref 39–117)
BUN/Creatinine Ratio: 10 (ref 9–23)
BUN: 7 mg/dL (ref 6–20)
Bilirubin Total: 0.5 mg/dL (ref 0.0–1.2)
CO2: 23 mmol/L (ref 20–29)
Calcium: 9.3 mg/dL (ref 8.7–10.2)
Chloride: 102 mmol/L (ref 96–106)
Creatinine, Ser: 0.7 mg/dL (ref 0.57–1.00)
GFR calc Af Amer: 138 mL/min/{1.73_m2} (ref >59)
GFR calc non Af Amer: 120 mL/min/{1.73_m2} (ref >59)
Globulin, Total: 2.4 g/dL (ref 1.5–4.5)
Glucose: 99 mg/dL (ref 65–99)
Potassium: 4.5 mmol/L (ref 3.5–5.2)
Sodium: 143 mmol/L (ref 134–144)
Total Protein: 6.5 g/dL (ref 6.0–8.5)

## 2018-07-21 LAB — CBC
Hematocrit: 43.7 % (ref 34.0–46.6)
Hemoglobin: 14.8 g/dL (ref 11.1–15.9)
MCH: 31.9 pg (ref 26.6–33.0)
MCHC: 33.9 g/dL (ref 31.5–35.7)
MCV: 94 fL (ref 79–97)
Platelets: 226 10*3/uL (ref 150–450)
RBC: 4.64 x10E6/uL (ref 3.77–5.28)
RDW: 12.9 % (ref 12.3–15.4)
WBC: 6.9 10*3/uL (ref 3.4–10.8)

## 2018-10-11 ENCOUNTER — Other Ambulatory Visit: Payer: Self-pay | Admitting: Obstetrics and Gynecology

## 2018-10-11 DIAGNOSIS — Z30431 Encounter for routine checking of intrauterine contraceptive device: Secondary | ICD-10-CM

## 2018-10-11 DIAGNOSIS — R102 Pelvic and perineal pain: Secondary | ICD-10-CM

## 2018-10-12 ENCOUNTER — Ambulatory Visit (INDEPENDENT_AMBULATORY_CARE_PROVIDER_SITE_OTHER): Payer: Self-pay

## 2018-10-12 ENCOUNTER — Encounter: Payer: Self-pay | Admitting: Obstetrics and Gynecology

## 2018-10-12 ENCOUNTER — Ambulatory Visit: Payer: Self-pay | Admitting: Obstetrics and Gynecology

## 2018-10-12 VITALS — BP 110/66 | HR 66 | Ht 64.0 in | Wt 137.7 lb

## 2018-10-12 DIAGNOSIS — R109 Unspecified abdominal pain: Secondary | ICD-10-CM

## 2018-10-12 DIAGNOSIS — Z975 Presence of (intrauterine) contraceptive device: Secondary | ICD-10-CM

## 2018-10-12 DIAGNOSIS — Z30431 Encounter for routine checking of intrauterine contraceptive device: Secondary | ICD-10-CM

## 2018-10-12 DIAGNOSIS — Z113 Encounter for screening for infections with a predominantly sexual mode of transmission: Secondary | ICD-10-CM

## 2018-10-12 DIAGNOSIS — R102 Pelvic and perineal pain: Secondary | ICD-10-CM

## 2018-10-12 DIAGNOSIS — R350 Frequency of micturition: Secondary | ICD-10-CM

## 2018-10-12 DIAGNOSIS — N83202 Unspecified ovarian cyst, left side: Secondary | ICD-10-CM

## 2018-10-12 LAB — POCT URINALYSIS DIPSTICK
Bilirubin, UA: NEGATIVE
Glucose, UA: NEGATIVE
Ketones, UA: NEGATIVE
Leukocytes, UA: NEGATIVE
Nitrite, UA: NEGATIVE
Protein, UA: NEGATIVE
Spec Grav, UA: 1.01 (ref 1.010–1.025)
Urobilinogen, UA: 0.2 E.U./dL
pH, UA: 7 (ref 5.0–8.0)

## 2018-10-12 MED ORDER — TRAMADOL HCL 50 MG PO TABS: 50.0000 mg | ORAL_TABLET | Freq: Four times a day (QID) | ORAL | 0 refills | Status: DC | PRN

## 2018-10-12 NOTE — Addendum Note (Signed)
Addended by: Edwyna Shell on: 10/12/2018 05:12 PM   Modules accepted: Orders

## 2018-10-12 NOTE — Progress Notes (Signed)
Pt stated that since she has had the mirena she is having irregular bleeding and cramps. No other concerns.

## 2018-10-12 NOTE — Patient Instructions (Signed)
Take active pills only of birth control samples (first 3 rows, a total of 6 weeks worth given).

## 2018-10-12 NOTE — Progress Notes (Signed)
GYNECOLOGY PROGRESS NOTE  Subjective:    Patient ID: Isabel [name], female    DOB: [birth year], 27 y.o.   MRN: [id number]  HPI  Patient is a 27 y.o. [phone] female who presents for complaints of pelvic pain (mostly right sided), right flank pain, and irregular bleeding x 2 weeks. Of note, she has a [place] IUD in place since September of this year. Notes that everything seemed to be going well with it until ~ 2 weeks ago.  She has been taking Ibuprofen with minimal relief.  The following portions of the patient's history were reviewed and updated as appropriate: allergies, current medications, past family history, past medical history, past social history, past surgical history and problem list.  Review of Systems Genitourinary:positive for urinary frequency for ~ 1-2 weeks, and also noting pain radiating from right pelvis to right flank.   Objective:   Blood pressure 110/66, pulse 66, height 5\' 4"  (1.626 m), weight 137 lb 11.2 oz (62.5 kg), not currently breastfeeding. General appearance: alert and no distress Abdomen: normal findings: bowel sounds normal, no masses palpable and soft, nondistended and abnormal findings:  mild tenderness in the RLQ Pelvic: external genitalia normal, rectovaginal septum normal.  Vagina with moderate amount of thin white discharge mixed with mucus.  Liletta strings visualized ~ 2 cm in length.  Cervix normal appearing, no lesions and no motion tenderness.  Uterus mobile, nontender, normal shape and size.  Adnexae non-palpable, but fullness noted on the right. Mild tenderness on the right.   Extremities: extremities normal, atraumatic, no cyanosis or edema Neurologic: Grossly normal   Imaging:  Patient Name: Isabel [name] DOB: 29-Sep-1991 MRN: [id number]  ULTRASOUND REPORT  Location: [facility]  Date of Service: 10/12/2018     Indications:Pelvic Pain , IUD checking  Findings:  The uterus is anteverted and measures  7.6x3.6x6cm. Echo texture is homogenous without evidence of focal masses. The Endometrium measures 4.7 mm.  IUD in its proper position in the endometrial canal, distance from top of iud to the fundus = 1.7cm  Right Ovary measures 4.9 cm3. It is normal in appearance. Left Ovary measures 40 cm3. It is not normal in appearance. A complex cyst (mostly echogenic) is seen on the Lt ovary (possibility of dermoid cyst vs others) . There is no free fluid in the cul de sac.  Impression: 1. IUD in its proper position in the endometrial canal, distance from the fundus = 1.7cm 2. Left Ovary measures 40 cm3. It is not normal in appearance. A complex cyst (mostly echogenic) is seen on the Lt ovary (possibility of dermoid cyst vs others)  Recommendations: 1.Clinical correlation with the patient's History and Physical Exam.   Ninfa Linden ,RDMS   Assessment:   Pelvic pain Irregular bleeding IUD in place Ovarian cyst (left) Urinary frequency and flank pain  Plan:   1. Discussion had on ultrasound findings, including correct positioning of IUD and left ovarian cyst. Cyst unclear in nature, possibly dermoid vs other type.  Discussed management options. Can start trial of OCPs, and if it is a non-dermoid cyst, then this will likely help it to resolve, as well as help with her irregular cycle. Given samples in office for 6 weeks (to only take active pills) 2. Pelvic pain not relieved with Ibuprofen.  Will prescribe short trial of Tramadol for pain.  Can continue to take Ibuprofen/Tylenol for milder pain 3. Urinary frequency and flank pain. Patient has a history of urinary issues in the past.  Will screen for UTI.  4. To return in 1 month if symptoms have not improved.   [name], MD [facility]

## 2018-10-14 ENCOUNTER — Other Ambulatory Visit (HOSPITAL_COMMUNITY)
Admission: RE | Admit: 2018-10-14 | Discharge: 2018-10-14 | Disposition: A | Discharge status: Home / Self Care | Payer: Medicaid Other | Source: Ambulatory Visit | Attending: Obstetrics and Gynecology | Admitting: Obstetrics and Gynecology

## 2018-10-14 DIAGNOSIS — R102 Pelvic and perineal pain: Secondary | ICD-10-CM | POA: Diagnosis present

## 2018-10-14 DIAGNOSIS — Z113 Encounter for screening for infections with a predominantly sexual mode of transmission: Secondary | ICD-10-CM | POA: Insufficient documentation

## 2018-10-14 NOTE — Addendum Note (Signed)
Addended by: Edwyna Shell on: 10/14/2018 08:39 AM   Modules accepted: Orders

## 2018-10-18 LAB — CERVICOVAGINAL ANCILLARY ONLY
Bacterial vaginitis: POSITIVE — AB
Candida vaginitis: NEGATIVE
Chlamydia: NEGATIVE
Neisseria Gonorrhea: NEGATIVE
Trichomonas: NEGATIVE

## 2018-10-20 ENCOUNTER — Other Ambulatory Visit: Payer: Self-pay

## 2018-10-20 MED ORDER — METRONIDAZOLE 500 MG PO TABS: 500.0000 mg | ORAL_TABLET | Freq: Two times a day (BID) | ORAL | 0 refills | Status: DC

## 2018-10-25 ENCOUNTER — Encounter: Payer: Medicaid Other | Admitting: Obstetrics and Gynecology

## 2018-11-04 ENCOUNTER — Encounter: Payer: Medicaid Other | Admitting: Obstetrics and Gynecology

## 2019-02-24 ENCOUNTER — Other Ambulatory Visit: Payer: Self-pay

## 2019-02-28 NOTE — Telephone Encounter (Signed)
Send refill request to [facility].

## 2019-03-02 MED ORDER — TRAMADOL HCL 50 MG PO TABS: 50.0000 mg | ORAL_TABLET | Freq: Four times a day (QID) | ORAL | 0 refills | Status: DC | PRN

## 2019-03-02 NOTE — Telephone Encounter (Signed)
Please advise. Thanks Fikisha 

## 2019-05-09 ENCOUNTER — Other Ambulatory Visit: Payer: Self-pay

## 2019-05-09 NOTE — Telephone Encounter (Signed)
Please advise. Thanks Fikisha 

## 2019-05-10 MED ORDER — TRAMADOL HCL 50 MG PO TABS: 50.0000 mg | ORAL_TABLET | Freq: Four times a day (QID) | ORAL | 0 refills | Status: DC | PRN

## 2019-05-18 ENCOUNTER — Other Ambulatory Visit: Payer: Self-pay | Admitting: *Deleted

## 2019-05-18 DIAGNOSIS — Z20822 Contact with and (suspected) exposure to covid-19: Secondary | ICD-10-CM

## 2019-05-23 LAB — NOVEL CORONAVIRUS, NAA: SARS-CoV-2, NAA: NOT DETECTED

## 2019-06-25 ENCOUNTER — Other Ambulatory Visit: Payer: Self-pay

## 2019-06-27 NOTE — Telephone Encounter (Signed)
Please advise. Thanks Fikisha 

## 2019-06-28 ENCOUNTER — Other Ambulatory Visit: Payer: Self-pay

## 2019-06-29 MED ORDER — TRAMADOL HCL 50 MG PO TABS: 50.0000 mg | ORAL_TABLET | Freq: Four times a day (QID) | ORAL | 0 refills | Status: DC | PRN

## 2019-06-29 NOTE — Telephone Encounter (Signed)
Patient needs to schedule an appointment for an annual exam as she has not had one in some time, also to re-evaluate her pain as she has been having to use her medication more frequently.

## 2019-06-29 NOTE — Telephone Encounter (Signed)
Please advise. Thanks Fikisha 

## 2019-06-30 NOTE — Telephone Encounter (Signed)
Pt called to give her information given by [facility] and pt made an appointment for annual exam as well.

## 2019-07-19 ENCOUNTER — Ambulatory Visit (INDEPENDENT_AMBULATORY_CARE_PROVIDER_SITE_OTHER): Payer: Medicaid Other | Admitting: Obstetrics and Gynecology

## 2019-07-19 ENCOUNTER — Other Ambulatory Visit: Payer: Self-pay

## 2019-07-19 ENCOUNTER — Encounter: Payer: Self-pay | Admitting: Obstetrics and Gynecology

## 2019-07-19 VITALS — BP 101/66 | HR 80 | Ht 64.0 in | Wt 147.3 lb

## 2019-07-19 DIAGNOSIS — G8929 Other chronic pain: Secondary | ICD-10-CM

## 2019-07-19 DIAGNOSIS — Z1231 Encounter for screening mammogram for malignant neoplasm of breast: Secondary | ICD-10-CM

## 2019-07-19 DIAGNOSIS — Z01419 Encounter for gynecological examination (general) (routine) without abnormal findings: Secondary | ICD-10-CM

## 2019-07-19 DIAGNOSIS — Z0000 Encounter for general adult medical examination without abnormal findings: Secondary | ICD-10-CM

## 2019-07-19 DIAGNOSIS — R102 Pelvic and perineal pain: Secondary | ICD-10-CM

## 2019-07-19 DIAGNOSIS — Z975 Presence of (intrauterine) contraceptive device: Secondary | ICD-10-CM

## 2019-07-19 DIAGNOSIS — Z1501 Genetic susceptibility to malignant neoplasm of breast: Secondary | ICD-10-CM

## 2019-07-19 DIAGNOSIS — Z803 Family history of malignant neoplasm of breast: Secondary | ICD-10-CM

## 2019-07-19 MED ORDER — GABAPENTIN 100 MG PO CAPS: 100.0000 mg | ORAL_CAPSULE | Freq: Three times a day (TID) | ORAL | 0 refills | Status: DC

## 2019-07-19 NOTE — Progress Notes (Signed)
GYNECOLOGY ANNUAL PHYSICAL EXAM PROGRESS NOTE  Subjective:    Isabel [name] is a 28 y.o. [phone] female with family history of breast cancer in mother at young age (38), positive for PALB2 gene (increased risk for breast, unknown for ovarian cancer) who presents for an annual exam. The patient has no complaints today. The patient is sexually active. The patient wears seatbelts: yes. The patient participates in regular exercise: no. Has the patient ever been transfused or tattooed?: no. The patient reports that there is not domestic violence in her life.   The patient has the following complaints today:  1. Patient continues to note moderate back pain. This has been ongoing for ~ 1 year. Pain is mid-thoracic and lumbar. Sometimes radiates down to right leg.  Also now noting ongoing sharp shooting vaginal pains for the past several months, intermittent, usually lasting for approximately 5 to 30 seconds and then resolving.  Denies any vaginal discharge, or abnormal bleeding.  Notes that she is no longer having menstrual cycles due to her IUD which she is happy about.   Gynecologic History Menarche age: 86 No LMP recorded. (Menstrual status: IUD). Contraception: Liletta IUD, placed 07/2018 History of STI's: Denies Last Pap: 07/13/2018. Results were: normal.  History of abnormal (LGSIL) pap in 2017.   Last mammogram: 06/27/2018 (for nipple discharge and palpable breast lump in right breast). Results were: normal   OB History  Gravida Para Term Preterm AB Living  '4 2 2 ' 0 2 1  SAB TAB Ectopic Multiple Live Births  2 0 0 0 1    # Outcome Date GA Lbr Len/2nd Weight Sex Delivery Anes PTL Lv  4 Term 11/02/16 [redacted]w[redacted]d/ 00:09 6 lb 8.4 oz (2.96 kg) M Vag-Spont None  LIV     Name: Rosetti,BOY Griselle     Apgar1: 8  Apgar5: 9  3 SAB 2016 [id number]       2 SAB 2016 [id number]      1 Term 2011 [id number] 1.8 oz (3.225 kg) F Vag-Spont EPI  LIV    Past Medical History:  Diagnosis Date  .  Dysmenorrhea   . Family history of breast cancer in mother   . Genetic testing 04/25/18   PALB2 analysis @ [facility] - Familial pathogenic PALB2 mutation detected  . Migraines   . 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 CHOLECYSTECTOMY      Family History  Problem Relation Age of Onset  . Breast cancer Mother 39  77   currently 48  78Hypertension Father   . Arthritis Other   . Diabetes Other   . Cancer Other   . Diabetes Maternal Grandmother   . Diabetes Maternal Grandfather   . Diabetes Paternal Grandmother   . Diabetes Paternal Grandfather   . Breast cancer Maternal Aunt 50  13   currently 50; 41LB2 mutation  . Colon cancer Neg Hx   . Heart disease Neg Hx     Social History   Socioeconomic History  . Marital status: Single    Spouse name: Not on file  . Number of children: Not on file  . Years of education: 12  69Highest education level: Not on file  Occupational History  . Not on file  Social Needs  . Financial resource strain: Not on file  .  Food insecurity    Worry: Not on file    Inability: Not on file  . Transportation needs    Medical: Not on file    Non-medical: Not on file  Tobacco Use  . Smoking status: Current Every Day Smoker    Packs/day: 0.25    Types: Cigarettes  . Smokeless tobacco: Never Used  Substance and Sexual Activity  . Alcohol use: No  . Drug use: Yes    Types: Marijuana    Comment: last smoked - 01/23/2016- helps with her cramps- smokes weekly  . Sexual activity: Yes    Birth control/protection: None, Condom, I.U.D.    Comment: Husband plans vasectomy   Lifestyle  . Physical activity    Days per week: Not on file    Minutes per session: Not on file  . Stress: Not on file  Relationships  . Social Herbalist on phone: Not on file    Gets together: Not on file    Attends religious service: Not on file     Active member of club or organization: Not on file    Attends meetings of clubs or organizations: Not on file    Relationship status: Not on file  . Intimate partner violence    Fear of current or ex partner: Not on file    Emotionally abused: Not on file    Physically abused: Not on file    Forced sexual activity: Not on file  Other Topics Concern  . Not on file  Social History Narrative  . Not on file    Current Outpatient Medications on File Prior to Visit  Medication Sig Dispense Refill  . levonorgestrel (MIRENA) 20 MCG/24HR IUD 1 each by Intrauterine route once.    . traMADol (ULTRAM) 50 MG tablet Take 1 tablet (50 mg total) by mouth every 6 (six) hours as needed for moderate pain. May take up to 2 tabs q 6 hr for severe pain 60 tablet 0   No current facility-administered medications on file prior to visit.     Allergies  Allergen Reactions  . Amoxicillin Hives  . Penicillins Hives      Review of Systems Constitutional: negative for chills, fatigue, fevers and sweats Eyes: negative for irritation, redness and visual disturbance Ears, nose, mouth, throat, and face: negative for hearing loss, nasal congestion, snoring and tinnitus Respiratory: negative for asthma, cough, sputum Cardiovascular: negative for chest pain, dyspnea, exertional chest pressure/discomfort, irregular heart beat, palpitations and syncope Gastrointestinal: negative for abdominal pain, change in bowel habits, nausea and vomiting Genitourinary: negative for abnormal menstrual periods, genital lesions, sexual problems and vaginal discharge, dysuria and urinary incontinence Integument/breast: negative for breast lump, breast tenderness and nipple discharge Hematologic/lymphatic: negative for bleeding and easy bruising Musculoskeletal:negative for back pain and muscle weakness Neurological: negative for dizziness, headaches, vertigo and weakness Endocrine: negative for diabetic symptoms including  polydipsia, polyuria and skin dryness Allergic/Immunologic: negative for hay fever and urticaria        Objective:  Blood pressure 101/66, pulse 80, height '5\' 4"'  (1.626 m), weight 147 lb 4.8 oz (66.8 kg). Body mass index is 25.28 kg/m.  General Appearance:    Alert, cooperative, no distress, appears stated age  Head:    Normocephalic, without obvious abnormality, atraumatic  Eyes:    PERRL, conjunctiva/corneas clear, EOM's intact, both eyes  Ears:    Normal external ear canals, both ears  Nose:   Nares normal, septum midline, mucosa normal, no drainage or sinus  tenderness  Throat:   Lips, mucosa, and tongue normal; teeth and gums normal  Neck:   Supple, symmetrical, trachea midline, no adenopathy; thyroid: no enlargement/tenderness/nodules; no carotid bruit or JVD  Back:     Symmetric, no curvature, ROM normal, no CVA tenderness  Lungs:     Clear to auscultation bilaterally, respirations unlabored  Chest Wall:    No tenderness or deformity   Heart:    Regular rate and rhythm, S1 and S2 normal, no murmur, rub or gallop  Breast Exam:    No tenderness, masses, or nipple abnormality  Abdomen:     Soft, non-tender, bowel sounds active all four quadrants, no masses, no organomegaly.    Genitalia:    Pelvic:external genitalia normal, vagina without lesions, discharge, or tenderness. Rectovaginal septum  normal. Cervix normal in appearance, no cervical motion tenderness, no adnexal masses or tenderness.  IUD threads visible, ~ 3 cm from cervical os. Uterus normal size, shape, mobile, regular contours, nontender.  Rectal:    Normal external sphincter.  No hemorrhoids appreciated. Internal exam not done.   Extremities:   Extremities normal, atraumatic, no cyanosis or edema  Pulses:   2+ and symmetric all extremities  Skin:   Skin color, texture, turgor normal, no rashes or lesions  Lymph nodes:   Cervical, supraclavicular, and axillary nodes normal  Neurologic:   CNII-XII intact, normal strength,  sensation and reflexes throughout   .  Labs:  Lab Results  Component Value Date   WBC 6.9 07/20/2018   HGB 14.8 07/20/2018   HCT 43.7 07/20/2018   MCV 94 07/20/2018   PLT 226 07/20/2018    Lab Results  Component Value Date   CREATININE 0.70 07/20/2018   BUN 7 07/20/2018   NA 143 07/20/2018   K 4.5 07/20/2018   CL 102 07/20/2018   CO2 23 07/20/2018    Lab Results  Component Value Date   ALT 8 07/20/2018   AST 7 07/20/2018   ALKPHOS 54 07/20/2018   BILITOT 0.5 07/20/2018    No results found for: TSH   Assessment:   Well woman exam with routine gynecological exam  Family history of breast cancer in mother Chronic midline low back pain with right-sided sciatica IUD (intrauterine device) in place Vaginal pain Encounter for screening mammogram for malignant neoplasm of breast 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 - Breast self exam technique reviewed and patient encouraged to perform self-exam monthly.  Mammogram ordered for initial screening, as patient needs to begin mammograms beginning at age 6, 71 years prior to onset of breast cancer in her mother.  Also will alternate with MRIs every 6 months. - Contraception: IUD [place], placed 07/2018).  IUD is currently managing symptoms of dysmenorrhea, PMS, and heavy menses. - Discussed healthy lifestyle modifications. - Pap smear up-to-date.  Patient with history of abnormal Pap smear in 2017, however most recent Pap smear was normal.  Continue routine screening. - Family h/o breast cancer (hereditary) - Patient has undergone genetic testing (positive for PALB2 gene). Had mammogram performed last year due to persistent nipple discharge.  She should initiate screening mammograms starting at age 58 (3 years less than age of earliest diagnosed cancer in family member),  And also alternating with MRI  every 6 months. She can also consider risk-reducing bilateral mastectomy. She has also been previously counseled regarding her risk of ovarian cancer (  although no specific risk known with current gene), but may consider risk-reducing BSO based on age and other factors.  -Chronic low back pain -on further discussion with patient she works approximately 10-hour shifts 5 to 6 days/week standing.  Discussed supportive shoes, back exercises/stretches.  Also discussed the remote possibility that her IUD could be causing her back and vaginal pain, however patient does not desire at this time to remove.  She will continue use of her tramadol as needed for pain as well as use of NSAIDs as needed.  Unfortunately due to patient's insurance, with family planning Medicaid, she is limited to the number of visits and providers without incurring significant out of pocket expenses, as well as access to ancillary therapy such as physical therapy. Also discussed the use of Gabapentin for nerve pain.  Will initiate at 100 mg BID-TID, and can titrate up.  - Vaginal pain with no clear cause, may be referred pain from back, or could be related to IUD. Did not note any vaginal discharge or abnormal pelvic findings today. Reiterated possibility of IUD causing pain if no other cause is found. To f/u in 2 months if none of the above mentioned interventions are helping. Could consider a 1-2 month trial without the IUD. If pain still does not resolve, then it was less likely due to the IUD and can have a new one placed.  - Follow up in 1 year for annual exam.    [name], MD [facility]

## 2019-07-19 NOTE — Patient Instructions (Addendum)
Preventive Care 21-28 Years Old, Female Preventive care refers to visits with your health care provider and lifestyle choices that can promote health and wellness. This includes:  A yearly physical exam. This may also be called an annual well check.  Regular dental visits and eye exams.  Immunizations.  Screening for certain conditions.  Healthy lifestyle choices, such as eating a healthy diet, getting regular exercise, not using drugs or products that contain nicotine and tobacco, and limiting alcohol use. What can I expect for my preventive care visit? Physical exam Your health care provider will check your:  Height and weight. This may be used to calculate body mass index (BMI), which tells if you are at a healthy weight.  Heart rate and blood pressure.  Skin for abnormal spots. Counseling Your health care provider may ask you questions about your:  Alcohol, tobacco, and drug use.  Emotional well-being.  Home and relationship well-being.  Sexual activity.  Eating habits.  Work and work environment.  Method of birth control.  Menstrual cycle.  Pregnancy history. What immunizations do I need?  Influenza (flu) vaccine  This is recommended every year. Tetanus, diphtheria, and pertussis (Tdap) vaccine  You may need a Td booster every 10 years. Varicella (chickenpox) vaccine  You may need this if you have not been vaccinated. Human papillomavirus (HPV) vaccine  If recommended by your health care provider, you may need three doses over 6 months. Measles, mumps, and rubella (MMR) vaccine  You may need at least one dose of MMR. You may also need a second dose. Meningococcal conjugate (MenACWY) vaccine  One dose is recommended if you are age 19-21 years and a first-year college student living in a residence hall, or if you have one of several medical conditions. You may also need additional booster doses. Pneumococcal conjugate (PCV13) vaccine  You may need  this if you have certain conditions and were not previously vaccinated. Pneumococcal polysaccharide (PPSV23) vaccine  You may need one or two doses if you smoke cigarettes or if you have certain conditions. Hepatitis A vaccine  You may need this if you have certain conditions or if you travel or work in places where you may be exposed to hepatitis A. Hepatitis B vaccine  You may need this if you have certain conditions or if you travel or work in places where you may be exposed to hepatitis B. Haemophilus influenzae type b (Hib) vaccine  You may need this if you have certain conditions. You may receive vaccines as individual doses or as more than one vaccine together in one shot (combination vaccines). Talk with your health care provider about the risks and benefits of combination vaccines. What tests do I need?  Blood tests  Lipid and cholesterol levels. These may be checked every 5 years starting at age 20.  Hepatitis C test.  Hepatitis B test. Screening  Diabetes screening. This is done by checking your blood sugar (glucose) after you have not eaten for a while (fasting).  Sexually transmitted disease (STD) testing.  BRCA-related cancer screening. This may be done if you have a family history of breast, ovarian, tubal, or peritoneal cancers.  Pelvic exam and Pap test. This may be done every 3 years starting at age 21. Starting at age 30, this may be done every 5 years if you have a Pap test in combination with an HPV test. Talk with your health care provider about your test results, treatment options, and if necessary, the need for more tests.   Follow these instructions at home: °Eating and drinking ° °· Eat a diet that includes fresh fruits and vegetables, whole grains, lean protein, and low-fat dairy. °· Take vitamin and mineral supplements as recommended by your health care provider. °· Do not drink alcohol if: °? Your health care provider tells you not to drink. °? You are  pregnant, may be pregnant, or are planning to become pregnant. °· If you drink alcohol: °? Limit how much you have to 0-1 drink a day. °? Be aware of how much alcohol is in your drink. In the [place] one drink equals one 12 oz bottle of beer (355 mL), one 5 oz glass of wine (148 mL), or one 1½ oz glass of hard liquor (44 mL). °Lifestyle °· Take daily care of your teeth and gums. °· Stay active. Exercise for at least 30 minutes on 5 or more days each week. °· Do not use any products that contain nicotine or tobacco, such as cigarettes, e-cigarettes, and chewing tobacco. If you need help quitting, ask your health care provider. °· If you are sexually active, practice safe sex. Use a condom or other form of birth control (contraception) in order to prevent pregnancy and STIs (sexually transmitted infections). If you plan to become pregnant, see your health care provider for a preconception visit. °What's next? °· Visit your health care provider once a year for a well check visit. °· Ask your health care provider how often you should have your eyes and teeth checked. °· Stay up to date on all vaccines. °This information is not intended to replace advice given to you by your health care provider. Make sure you discuss any questions you have with your health care provider. °Document Released: 12/22/2001 Document Revised: 07/07/2018 Document Reviewed: 07/07/2018 °Elsevier Patient Education © 2020 [employer] °Breast Self-Awareness °Breast self-awareness is knowing how your breasts look and feel. Doing breast self-awareness is important. It allows you to catch a breast problem early while it is still small and can be treated. All women should do breast self-awareness, including women who have had breast implants. Tell your doctor if you notice a change in your breasts. °What you need: °· A mirror. °· A well-lit room. °How to do a breast self-exam °A breast self-exam is one way to learn what is normal for your breasts and to  check for changes. To do a breast self-exam: °Look for changes ° °1. Take off all the clothes above your waist. °2. Stand in front of a mirror in a room with good lighting. °3. Put your hands on your hips. °4. Push your hands down. °5. Look at your breasts and nipples in the mirror to see if one breast or nipple looks different from the other. Check to see if: °? The shape of one breast is different. °? The size of one breast is different. °? There are wrinkles, dips, and bumps in one breast and not the other. °6. Look at each breast for changes in the skin, such as: °? Redness. °? Scaly areas. °7. Look for changes in your nipples, such as: °? Liquid around the nipples. °? Bleeding. °? Dimpling. °? Redness. °? A change in where the nipples are. °Feel for changes ° °1. Lie on your back on the floor. °2. Feel each breast. To do this, follow these steps: °? Pick a breast to feel. °? Put the arm closest to that breast above your head. °? Use your other arm to feel the nipple area of   your breast. Feel the area with the pads of your three middle fingers by making small circles with your fingers. For the first circle, press lightly. For the second circle, press harder. For the third circle, press even harder. ? Keep making circles with your fingers at the different pressures as you move down your breast. Stop when you feel your ribs. ? Move your fingers a little toward the center of your body. ? Start making circles with your fingers again, this time going up until you reach your collarbone. ? Keep making up-and-down circles until you reach your armpit. Remember to keep using the three pressures. ? Feel the other breast in the same way. 3. Sit or stand in the tub or shower. 4. With soapy water on your skin, feel each breast the same way you did in step 2 when you were lying on the floor. Write down what you find Writing down what you find can help you remember what to tell your doctor. Write down:  What is  normal for each breast.  Any changes you find in each breast, including: ? The kind of changes you find. ? Whether you have pain. ? Size and location of any lumps.  When you last had your menstrual period. General tips  Check your breasts every month.  If you are breastfeeding, the best time to check your breasts is after you feed your baby or after you use a breast pump.  If you get menstrual periods, the best time to check your breasts is 5-7 days after your menstrual period is over.  With time, you will become comfortable with the self-exam, and you will begin to know if there are changes in your breasts. Contact a doctor if you:  See a change in the shape or size of your breasts or nipples.  See a change in the skin of your breast or nipples, such as red or scaly skin.  Have fluid coming from your nipples that is not normal.  Find a lump or thick area that was not there before.  Have pain in your breasts.  Have any concerns about your breast health. Summary  Breast self-awareness includes looking for changes in your breasts, as well as feeling for changes within your breasts.  Breast self-awareness should be done in front of a mirror in a well-lit room.  You should check your breasts every month. If you get menstrual periods, the best time to check your breasts is 5-7 days after your menstrual period is over.  Let your doctor know of any changes you see in your breasts, including changes in size, changes on the skin, pain or tenderness, or fluid from your nipples that is not normal. This information is not intended to replace advice given to you by your health care provider. Make sure you discuss any questions you have with your health care provider. Document Released: 04/13/2008 Document Revised: 06/14/2018 Document Reviewed: 06/14/2018 Elsevier Patient Education  2020 [place] your health care provider which exercises are safe for you.  Do exercises exactly as told by your health care provider and adjust them as directed. It is normal to feel mild stretching, pulling, tightness, or discomfort as you do these exercises. Stop right away if you feel sudden pain or your pain gets worse. Do not begin these exercises until told by your health care provider. Stretching and range-of-motion exercises These exercises warm up your muscles and joints and improve the movement and  flexibility of your hips and back. These exercises also help to relieve pain, numbness, and tingling. Sciatic nerve glide 1. Sit in a chair with your head facing down toward your chest. Place your hands behind your back. Let your shoulders slump forward. 2. Slowly straighten one of your legs while you tilt your head back as if you are looking toward the ceiling. Only straighten your leg as far as you can without making your symptoms worse. 3. Hold this position for _____10_____ seconds. 4. Slowly return to the starting position. 5. Repeat with your other leg. Repeat _____5_____ times. Complete this exercise ____2______ times a day. Knee to chest with hip adduction and internal rotation  1. Lie on your back on a firm surface with both legs straight. 2. Bend one of your knees and move it up toward your chest until you feel a gentle stretch in your lower back and buttock. Then, move your knee toward the shoulder that is on the opposite side from your leg. This is hip adduction and internal rotation. ? Hold your leg in this position by holding on to the front of your knee. 3. Hold this position for ____10______ seconds. 4. Slowly return to the starting position. 5. Repeat with your other leg. Repeat ___5_______ times. Complete this exercise _____2_____ times a day. Prone extension on elbows  1. Lie on your abdomen on a firm surface. A bed may be too soft for this exercise. 2. Prop yourself up on your elbows. 3. Use your arms to help lift your chest up until you feel  a gentle stretch in your abdomen and your lower back. ? This will place some of your body weight on your elbows. If this is uncomfortable, try stacking pillows under your chest. ? Your hips should stay down, against the surface that you are lying on. Keep your hip and back muscles relaxed. 4. Hold this position for _____10_____ seconds. 5. Slowly relax your upper body and return to the starting position. Repeat _____5_____ times. Complete this exercise ____2______ times a day. Strengthening exercises These exercises build strength and endurance in your back. Endurance is the ability to use your muscles for a long time, even after they get tired. Pelvic tilt This exercise strengthens the muscles that lie deep in the abdomen. 1. Lie on your back on a firm surface. Bend your knees and keep your feet flat on the floor. 2. Tense your abdominal muscles. Tip your pelvis up toward the ceiling and flatten your lower back into the floor. ? To help with this exercise, you may place a small towel under your lower back and try to push your back into the towel. 3. Hold this position for ____10______ seconds. 4. Let your muscles relax completely before you repeat this exercise. Repeat ____5______ times. Complete this exercise _____2_____ times a day. Alternating arm and leg raises  1. Get on your hands and knees on a firm surface. If you are on a hard floor, you may want to use padding, such as an exercise mat, to cushion your knees. 2. Line up your arms and legs. Your hands should be directly below your shoulders, and your knees should be directly below your hips. 3. Lift your left leg behind you. At the same time, raise your right arm and straighten it in front of you. ? Do not lift your leg higher than your hip. ? Do not lift your arm higher than your shoulder. ? Keep your abdominal and back muscles tight. ? Keep  your hips facing the ground. ? Do not arch your back. ? Keep your balance carefully, and  do not hold your breath. 4. Hold this position for _____10_____ seconds. 5. Slowly return to the starting position. 6. Repeat with your right leg and your left arm. Repeat _____3_____ times. Complete this exercise ____2______ times a day. Posture and body mechanics Good posture and healthy body mechanics can help to relieve stress in your body's tissues and joints. Body mechanics refers to the movements and positions of your body while you do your daily activities. Posture is part of body mechanics. Good posture means:  Your spine is in its natural S-curve position (neutral).  Your shoulders are pulled back slightly.  Your head is not tipped forward. Follow these guidelines to improve your posture and body mechanics in your everyday activities. Standing   When standing, keep your spine neutral and your feet about hip width apart. Keep a slight bend in your knees. Your ears, shoulders, and hips should line up.  When you do a task in which you stand in one place for a long time, place one foot up on a stable object that is 2-4 inches (5-10 cm) high, such as a footstool. This helps keep your spine neutral. Sitting   When sitting, keep your spine neutral and keep your feet flat on the floor. Use a footrest, if necessary, and keep your thighs parallel to the floor. Avoid rounding your shoulders, and avoid tilting your head forward.  When working at a desk or a computer, keep your desk at a height where your hands are slightly lower than your elbows. Slide your chair under your desk so you are close enough to maintain good posture.  When working at a computer, place your monitor at a height where you are looking straight ahead and you do not have to tilt your head forward or downward to look at the screen. Resting  When lying down and resting, avoid positions that are most painful for you.  If you have pain with activities such as sitting, bending, stooping, or squatting, lie in a position  in which your body does not bend very much. For example, avoid curling up on your side with your arms and knees near your chest (fetal position).  If you have pain with activities such as standing for a long time or reaching with your arms, lie with your spine in a neutral position and bend your knees slightly. Try the following positions: ? Lying on your side with a pillow between your knees. ? Lying on your back with a pillow under your knees. Lifting   When lifting objects, keep your feet at least shoulder width apart and tighten your abdominal muscles.  Bend your knees and hips and keep your spine neutral. It is important to lift using the strength of your legs, not your back. Do not lock your knees straight out.  Always ask for help to lift heavy or awkward objects. This information is not intended to replace advice given to you by your health care provider. Make sure you discuss any questions you have with your health care provider. Document Released: 10/26/2005 Document Revised: 02/17/2019 Document Reviewed: 11/17/2018 Elsevier Patient Education  2020 [employer].

## 2019-07-19 NOTE — Progress Notes (Signed)
Pt present for annual exam. Pt stated having pain in the vaginal area off and on that causes a lot of pain even to walk, sit or stand.

## 2019-07-20 LAB — CBC
Hematocrit: 43.6 % (ref 34.0–46.6)
Hemoglobin: 14.8 g/dL (ref 11.1–15.9)
MCH: 32 pg (ref 26.6–33.0)
MCHC: 33.9 g/dL (ref 31.5–35.7)
MCV: 94 fL (ref 79–97)
Platelets: 228 10*3/uL (ref 150–450)
RBC: 4.62 x10E6/uL (ref 3.77–5.28)
RDW: 12.7 % (ref 11.7–15.4)
WBC: 7.9 10*3/uL (ref 3.4–10.8)

## 2019-07-20 LAB — COMPREHENSIVE METABOLIC PANEL
ALT: 8 IU/L (ref 0–32)
AST: 11 IU/L (ref 0–40)
Albumin/Globulin Ratio: 2.3 — ABNORMAL HIGH (ref 1.2–2.2)
Albumin: 4.3 g/dL (ref 3.9–5.0)
Alkaline Phosphatase: 60 IU/L (ref 39–117)
BUN/Creatinine Ratio: 13 (ref 9–23)
BUN: 9 mg/dL (ref 6–20)
Bilirubin Total: 0.3 mg/dL (ref 0.0–1.2)
CO2: 26 mmol/L (ref 20–29)
Calcium: 9.2 mg/dL (ref 8.7–10.2)
Chloride: 100 mmol/L (ref 96–106)
Creatinine, Ser: 0.71 mg/dL (ref 0.57–1.00)
GFR calc Af Amer: 135 mL/min/{1.73_m2} (ref >59)
GFR calc non Af Amer: 117 mL/min/{1.73_m2} (ref >59)
Globulin, Total: 1.9 g/dL (ref 1.5–4.5)
Glucose: 67 mg/dL (ref 65–99)
Potassium: 4.2 mmol/L (ref 3.5–5.2)
Sodium: 141 mmol/L (ref 134–144)
Total Protein: 6.2 g/dL (ref 6.0–8.5)

## 2019-08-09 ENCOUNTER — Other Ambulatory Visit: Payer: Self-pay

## 2019-08-10 ENCOUNTER — Other Ambulatory Visit: Payer: Self-pay

## 2019-08-10 NOTE — Telephone Encounter (Signed)
Please advise. Thanks Fikisha 

## 2019-08-11 NOTE — Telephone Encounter (Signed)
Prior to me refilling this med, can we see if she is noting any improvement with the medication and what dose she is currently taking as I gave her permission to titrate up as needed.  Dr. Marcelline Mates

## 2019-08-13 ENCOUNTER — Other Ambulatory Visit: Payer: Self-pay | Admitting: Obstetrics and Gynecology

## 2019-08-17 NOTE — Telephone Encounter (Signed)
Dr. Marcelline Mates has already refilled this.

## 2019-09-03 ENCOUNTER — Other Ambulatory Visit: Payer: Self-pay | Admitting: Obstetrics and Gynecology

## 2019-09-04 NOTE — Telephone Encounter (Signed)
Pt called no answer LM via VM to call the office to speak about her medication refill.

## 2019-09-04 NOTE — Telephone Encounter (Signed)
Please see how many tablets the patient is taking per day so I can adjust her prescription as I just refilled this medication 3 weeks ago.

## 2019-09-11 NOTE — Telephone Encounter (Signed)
Pt called no answer LM via Vm to cal the office to go over medication refill.

## 2019-09-19 NOTE — Telephone Encounter (Signed)
Please advise. Thanks Fikisha 

## 2019-09-19 NOTE — Telephone Encounter (Signed)
Pt called back and stated that she was taking 6 tablets a day of Gabapentin 100mg  2 tablets in the morning 2 tablets in the afternoon and 2 tablets at bedtime. Pt also stated that she needs a refill of tramadol as well.

## 2019-10-02 ENCOUNTER — Other Ambulatory Visit: Payer: Self-pay

## 2019-10-02 DIAGNOSIS — Z7689 Persons encountering health services in other specified circumstances: Secondary | ICD-10-CM

## 2019-10-04 NOTE — Telephone Encounter (Signed)
Please advise.  thanks [employer]

## 2019-10-09 MED ORDER — TRAMADOL HCL 50 MG PO TABS: 50.0000 mg | ORAL_TABLET | Freq: Four times a day (QID) | ORAL | 0 refills | Status: DC | PRN

## 2019-10-09 NOTE — Telephone Encounter (Signed)
Please inform patient that I will refill her pain medication but we may need to send her to a PCP for at least 1 visit if she will continue to need pain medication regularly. I know her insurance poses an issue with how much care she can receive. Please place a referral to a PCP.   Dr. Marcelline Mates

## 2019-10-10 NOTE — Addendum Note (Signed)
Addended by: Edwyna Shell on: 10/10/2019 01:25 PM   Modules accepted: Orders

## 2019-10-10 NOTE — Telephone Encounter (Signed)
Pt called and stated that she wanted a referral to a pcp to manage her pain. Referral completed and done.

## 2020-02-05 ENCOUNTER — Ambulatory Visit: Payer: Medicaid Other | Admitting: Adult Health

## 2020-02-12 ENCOUNTER — Ambulatory Visit
Admission: EM | Admit: 2020-02-12 | Discharge: 2020-02-12 | Disposition: A | Discharge status: Home / Self Care | Payer: Medicaid Other | Attending: Urgent Care | Admitting: Urgent Care

## 2020-02-12 ENCOUNTER — Other Ambulatory Visit: Payer: Self-pay

## 2020-02-12 ENCOUNTER — Ambulatory Visit (INDEPENDENT_AMBULATORY_CARE_PROVIDER_SITE_OTHER)
Admission: RE | Admit: 2020-02-12 | Discharge: 2020-02-12 | Disposition: A | Discharge status: Other Acute Inpatient Hospital | Payer: Medicaid Other | Source: Ambulatory Visit

## 2020-02-12 ENCOUNTER — Encounter: Payer: Self-pay | Admitting: Emergency Medicine

## 2020-02-12 DIAGNOSIS — J3489 Other specified disorders of nose and nasal sinuses: Secondary | ICD-10-CM

## 2020-02-12 DIAGNOSIS — R05 Cough: Secondary | ICD-10-CM | POA: Insufficient documentation

## 2020-02-12 DIAGNOSIS — J3089 Other allergic rhinitis: Secondary | ICD-10-CM

## 2020-02-12 DIAGNOSIS — R059 Cough, unspecified: Secondary | ICD-10-CM

## 2020-02-12 DIAGNOSIS — R0982 Postnasal drip: Secondary | ICD-10-CM

## 2020-02-12 DIAGNOSIS — R070 Pain in throat: Secondary | ICD-10-CM

## 2020-02-12 DIAGNOSIS — F17210 Nicotine dependence, cigarettes, uncomplicated: Secondary | ICD-10-CM

## 2020-02-12 LAB — POCT RAPID STREP A (OFFICE): Rapid Strep A Screen: NEGATIVE

## 2020-02-12 MED ORDER — PREDNISONE 20 MG PO TABS: 40.0000 mg | ORAL_TABLET | Freq: Every day | ORAL | 0 refills | Status: DC

## 2020-02-12 NOTE — ED Provider Notes (Signed)
Virtual Visit via Video Note:  Isabel [name]  initiated request for Telemedicine visit with [facility] team. I connected with Isabel [name]  on 02/12/2020 at 8:46 AM  for a synchronized telemedicine visit using a video enabled HIPPA compliant telemedicine application. I verified that I am speaking with Isabel [name]  using two identifiers. Jaynee Eagles, PA-C  was physically located in a [facility]gent care site and Isabel [name] was located at a different location.   The limitations of evaluation and management by telemedicine as well as the availability of in-person appointments were discussed. Patient was informed that she  may incur a bill ( including co-pay) for this virtual visit encounter. Isabel [name]  expressed understanding and gave verbal consent to proceed with virtual visit.     History of Present Illness:Isabel [name]  is a 29 y.o. female presents with concerns about sinus infection and strep throat.  Would like to have consideration for an antibiotic course.  ROS  Current Outpatient Medications  Medication Sig Dispense Refill  . gabapentin (NEURONTIN) 100 MG capsule Take 2 capsules (200 mg total) by mouth 3 (three) times daily. 180 capsule 3  . levonorgestrel (MIRENA) 20 MCG/24HR IUD 1 each by Intrauterine route once.    . traMADol (ULTRAM) 50 MG tablet Take 1 tablet (50 mg total) by mouth every 6 (six) hours as needed for moderate pain. May take up to 2 tabs q 6 hr for severe pain 60 tablet 0   No current facility-administered medications for this encounter.     Allergies  Allergen Reactions  . Amoxicillin Hives  . Penicillins Hives     Past Medical History:  Diagnosis Date  . Dysmenorrhea   . Family history of breast cancer in mother   . Genetic testing 04/25/18   PALB2 analysis @ [facility] - Familial pathogenic PALB2 mutation detected  . Migraines   . 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 CHOLECYSTECTOMY        Observations/Objective: Physical Exam Constitutional:      General: She is not in acute distress.    Appearance: Normal appearance. She is well-developed. She is not ill-appearing, toxic-appearing or diaphoretic.  Eyes:     Extraocular Movements: Extraocular movements intact.  Pulmonary:     Effort: Pulmonary effort is normal.  Neurological:     General: No focal deficit present.     Mental Status: She is alert and oriented to person, place, and time.  Psychiatric:        Mood and Affect: Mood normal.        Behavior: Behavior normal.        Thought Content: Thought content normal.        Judgment: Judgment normal.      Assessment and Plan:  1. Throat pain    Video visit was very brief, discussed antibiotic stewardship.  Recommend the patient come into the clinic for an in person evaluation and strep testing.  She was very agreeable for this, will come into the [facility].   Follow Up Instructions:    I discussed the assessment and treatment plan with the patient. The patient was provided an opportunity to ask questions and all were answered. The patient agreed with the plan and demonstrated an understanding of the instructions.   The patient was advised to call back or seek  an in-person evaluation if the symptoms worsen or if the condition fails to improve as anticipated.  I provided 5 minutes of non-face-to-face time during this encounter.    Jaynee Eagles, PA-C  02/12/2020 8:46 AM         Jaynee Eagles, PA-C 02/12/20 [phone]

## 2020-02-12 NOTE — Discharge Instructions (Addendum)
In 2 to 3 weeks, start Flonase nasal spray for at least for the duration of your allergy season.  It is important to wait for this since he will be using a prednisone course now to treat an allergic rhinitis flare.  Keep taking Zyrtec daily through the end of April and longer if necessary.  After you are finished with the prednisone course Sudafed can be used as needed for breakthrough nasal congestion (congestion that happens even though you are taking daily allergy medications). Drink at least 64 ounces of water each day. Remove as many irritants/allergies as you are able to, no pets in the bedroom, change air filters in air vents.

## 2020-02-12 NOTE — ED Provider Notes (Signed)
Renae Gloss   MRN: [id number] DOB: 1991-02-08  Subjective:   Isabel [name] is a 29 y.o. female presenting for 3-day history of cute onset of recurrent stuffy and runny nose, throat pain, postnasal drainage and cough that elicits chest pain.  Patient states that she has the symptoms regularly around this time a year.  States that she would like to "nip her sinus infection in the bud" as she usually gets an infection this time a year and can have bronchitis.  Patient smokes cigarettes, smokes less than half pack a day and is working on quitting.  Has limited her Covid exposure, works at [employer] that does take out only.  No current facility-administered medications for this encounter.  Current Outpatient Medications:  .  gabapentin (NEURONTIN) 100 MG capsule, Take 2 capsules (200 mg total) by mouth 3 (three) times daily., Disp: 180 capsule, Rfl: 3 .  levonorgestrel (MIRENA) 20 MCG/24HR IUD, 1 each by Intrauterine route once., Disp: , Rfl:  .  traMADol (ULTRAM) 50 MG tablet, Take 1 tablet (50 mg total) by mouth every 6 (six) hours as needed for moderate pain. May take up to 2 tabs q 6 hr for severe pain, Disp: 60 tablet, Rfl: 0   Allergies  Allergen Reactions  . Amoxicillin Hives  . Penicillins Hives    Past Medical History:  Diagnosis Date  . Dysmenorrhea   . Family history of breast cancer in mother   . Genetic testing 04/25/18   PALB2 analysis @ [facility] - Familial pathogenic PALB2 mutation detected  . Migraines   . 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 CHOLECYSTECTOMY      Family History  Problem Relation Age of Onset  . Breast cancer Mother 73       currently 55  . Hypertension Father   . Arthritis Other   . Diabetes Other   . Cancer Other   . Diabetes Maternal Grandmother   . Diabetes Maternal Grandfather   . Diabetes Paternal  Grandmother   . Diabetes Paternal Grandfather   . Breast cancer Maternal Aunt 59       currently 47; PALB2 mutation  . Colon cancer Neg Hx   . Heart disease Neg Hx     Social History   Tobacco Use  . Smoking status: Current Every Day Smoker    Packs/day: 0.25    Types: Cigarettes  . Smokeless tobacco: Never Used  Substance Use Topics  . Alcohol use: No  . Drug use: Yes    Types: Marijuana    Comment: last smoked - 01/23/2016- helps with her cramps- smokes weekly    ROS Denies fever, sinus pain, ear pain, ear drainage, shortness of breath, wheezing.  Objective:   Vitals: BP 113/80 (BP Location: Left Arm)   Pulse 95   Temp 98.4 F (36.9 C) (Oral)   Resp 18   Wt 140 lb (63.5 kg)   SpO2 97%   BMI 24.03 kg/m   Physical Exam Constitutional:      General: She is not in acute distress.    Appearance: Normal appearance. She is well-developed. She is not ill-appearing, toxic-appearing or diaphoretic.  HENT:     Head: Normocephalic and atraumatic.     Right Ear: Tympanic membrane and ear canal normal. No drainage or tenderness. No middle ear effusion. Tympanic membrane is not erythematous.     Left  Ear: Tympanic membrane and ear canal normal. No drainage or tenderness.  No middle ear effusion. Tympanic membrane is not erythematous.     Nose: Congestion present. No rhinorrhea.     Mouth/Throat:     Mouth: Mucous membranes are moist. No oral lesions.     Pharynx: No pharyngeal swelling, oropharyngeal exudate, posterior oropharyngeal erythema or uvula swelling.     Tonsils: No tonsillar exudate or tonsillar abscesses.     Comments: Mild to moderate postnasal drainage overlying pharynx. Eyes:     Extraocular Movements: Extraocular movements intact.     Right eye: Normal extraocular motion.     Left eye: Normal extraocular motion.     Conjunctiva/sclera: Conjunctivae normal.     Pupils: Pupils are equal, round, and reactive to light.  Cardiovascular:     Rate and Rhythm:  Normal rate and regular rhythm.     Pulses: Normal pulses.     Heart sounds: Normal heart sounds. No murmur. No friction rub. No gallop.   Pulmonary:     Effort: Pulmonary effort is normal. No respiratory distress.     Breath sounds: Normal breath sounds. No stridor. No wheezing, rhonchi or rales.  Musculoskeletal:     Cervical back: Normal range of motion and neck supple.  Lymphadenopathy:     Cervical: No cervical adenopathy.  Skin:    General: Skin is warm and dry.     Findings: No rash.  Neurological:     General: No focal deficit present.     Mental Status: She is alert and oriented to person, place, and time.  Psychiatric:        Mood and Affect: Mood normal.        Behavior: Behavior normal.        Thought Content: Thought content normal.     Results for orders placed or performed during the hospital encounter of 02/12/20 (from the past 24 hour(s))  POCT rapid strep A     Status: None   Collection Time: 02/12/20  9:26 AM  Result Value Ref Range   Rapid Strep A Screen Negative Negative    Assessment and Plan :   1. Allergic rhinitis due to other allergic trigger, unspecified seasonality   2. Throat pain   3. Post-nasal drainage   4. Stuffy and runny nose   5. Cough   6. Cigarette smoker     Patient declined COVID-19 testing.  Counseled on antibiotic stewardship.  Use prednisone course given history of persistent recurrent rhinitis around this time a year, likely worsened by smoking.  Encourage patient to continue efforts at smoking cessation.  Counseled on management of allergies.  If patient has no improvement over the next 3 to 4 days, consider antibiotic course.  Strep culture pending.  Offered patient a note for work but she reports that she does not need this. Counseled patient on potential for adverse effects with medications prescribed/recommended today, ER and return-to-clinic precautions discussed, patient verbalized understanding.    Jaynee Eagles, [place]  02/12/20 [phone]

## 2020-02-12 NOTE — ED Notes (Signed)
Patient in office c/o sorethroat with yellow drainage and thick x 2d ago OTC: tylenol cold and flu  Denies:fever n/v

## 2020-02-14 LAB — CULTURE, GROUP A STREP (THRC)

## 2020-02-20 ENCOUNTER — Encounter: Payer: Self-pay | Admitting: Emergency Medicine

## 2020-02-20 ENCOUNTER — Other Ambulatory Visit: Payer: Self-pay

## 2020-02-20 ENCOUNTER — Ambulatory Visit
Admission: EM | Admit: 2020-02-20 | Discharge: 2020-02-20 | Disposition: A | Discharge status: Home / Self Care | Payer: Medicaid Other | Attending: Emergency Medicine | Admitting: Emergency Medicine

## 2020-02-20 DIAGNOSIS — J209 Acute bronchitis, unspecified: Secondary | ICD-10-CM

## 2020-02-20 MED ORDER — ALBUTEROL SULFATE HFA 108 (90 BASE) MCG/ACT IN AERS: 2.0000 | INHALATION_SPRAY | RESPIRATORY_TRACT | 0 refills | Status: DC | PRN

## 2020-02-20 MED ORDER — BENZONATATE 100 MG PO CAPS: 100.0000 mg | ORAL_CAPSULE | Freq: Three times a day (TID) | ORAL | 0 refills | Status: DC | PRN

## 2020-02-20 MED ORDER — AZITHROMYCIN 250 MG PO TABS: 250.0000 mg | ORAL_TABLET | Freq: Every day | ORAL | 0 refills | Status: DC

## 2020-02-20 NOTE — ED Triage Notes (Signed)
Patient in our UC for persistent chest congestion and cough medication regiment was completed and helped some but now has green mucus.  OTC: none

## 2020-02-20 NOTE — Discharge Instructions (Addendum)
Use the albuterol inhaler and take the Zithromax as directed.  Tessalon Perles as needed for cough.  Follow-up with your primary care provider if your symptoms are not improving.

## 2020-02-20 NOTE — ED Provider Notes (Signed)
[name]    CSN: [id number] Arrival date & time: 02/20/20  1419      History   Chief Complaint Chief Complaint  Patient presents with  . Cough  . chest congestion    HPI Isabel [name] is a 29 y.o. female.   Patient presents with ongoing cough which is now occasionally productive of green sputum.  She was seen here on 02/12/2020 and treated with prednisone for allergic rhinitis.  She states her nasal congestion cleared up completely with the prednisone but her cough has worsened.  She denies fever, shortness of breath, rash, vomiting, diarrhea, or other symptoms.  Patient declines COVID testing.  The history is provided by the patient.    Past Medical History:  Diagnosis Date  . Dysmenorrhea   . Family history of breast cancer in mother   . Genetic testing 04/25/18   PALB2 analysis @ [facility] - Familial pathogenic PALB2 mutation detected  . Migraines   . 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 Genetic testing   . Monoallelic mutation of PALB2 gene   . Family history of breast cancer in mother   . 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 Positive urine drug screen 03/27/2016  . Tobacco user 02/26/2016  . Irregular periods/menstrual cycles 02/26/2016  . Neck pain 02/22/2012  . LOW BACK PAIN 08/28/2008  . VASOVAGAL SYNCOPE 08/28/2008    Past Surgical History:  Procedure Laterality Date  . CHOLECYSTECTOMY      OB History    Gravida  4   Para  2   Term  2   Preterm      AB  2   Living  1     SAB  2   TAB      Ectopic      Multiple  0   Live Births  1            Home Medications    Prior to Admission medications   Medication Sig Start Date End Date Taking? Authorizing Provider  albuterol (VENTOLIN HFA) 108 (90 Base) MCG/ACT inhaler Inhale 2 puffs into the lungs every 4 (four) hours as needed for wheezing or  shortness of breath. 02/20/20   Sharion Balloon, NP  azithromycin (ZITHROMAX) 250 MG tablet Take 1 tablet (250 mg total) by mouth daily. Take first 2 tablets together, then 1 every day until finished. 02/20/20   Sharion Balloon, NP  benzonatate (TESSALON) 100 MG capsule Take 1 capsule (100 mg total) by mouth 3 (three) times daily as needed for cough. 02/20/20   Sharion Balloon, NP  gabapentin (NEURONTIN) 100 MG capsule Take 2 capsules (200 mg total) by mouth 3 (three) times daily. 09/20/19   [name], MD  levonorgestrel (MIRENA) 20 MCG/24HR IUD 1 each by Intrauterine route once.    [provider]  predniSONE (DELTASONE) 20 MG tablet Take 2 tablets (40 mg total) by mouth daily with breakfast. 02/12/20   Jaynee Eagles, PA-C  traMADol (ULTRAM) 50 MG tablet Take 1 tablet (50 mg total) by mouth every 6 (six) hours as needed for moderate pain. May take up to 2 tabs q 6 hr for severe pain 10/09/19   [name], MD    Family History Family History  Problem Relation Age of Onset  . Breast cancer Mother 29       currently 53  .  Hypertension Father   . Arthritis Other   . Diabetes Other   . Cancer Other   . Diabetes Maternal Grandmother   . Diabetes Maternal Grandfather   . Diabetes Paternal Grandmother   . Diabetes Paternal Grandfather   . Breast cancer Maternal Aunt 22       currently 70; PALB2 mutation  . Colon cancer Neg Hx   . Heart disease Neg Hx     Social History Social History   Tobacco Use  . Smoking status: Current Every Day Smoker    Packs/day: 0.25    Types: Cigarettes  . Smokeless tobacco: Never Used  Substance Use Topics  . Alcohol use: No  . Drug use: Yes    Types: Marijuana    Comment: last smoked - 01/23/2016- helps with her cramps- smokes weekly     Allergies   Amoxicillin and Penicillins   Review of Systems Review of Systems  Constitutional: Negative for chills and fever.  HENT: Negative for ear pain and sore throat.   Eyes: Negative for pain and  visual disturbance.  Respiratory: Positive for cough. Negative for shortness of breath.   Cardiovascular: Negative for chest pain and palpitations.  Gastrointestinal: Negative for abdominal pain, diarrhea, nausea and vomiting.  Genitourinary: Negative for dysuria and hematuria.  Musculoskeletal: Negative for arthralgias and back pain.  Skin: Negative for color change and rash.  Neurological: Negative for seizures and syncope.  All other systems reviewed and are negative.    Physical Exam Triage Vital Signs ED Triage Vitals  Enc Vitals Group     BP      Pulse      Resp      Temp      Temp src      SpO2      Weight      Height      Head Circumference      Peak Flow      Pain Score      Pain Loc      Pain Edu?      Excl. in [place]?    No data found.  Updated Vital Signs BP 120/86 (BP Location: Left Arm)   Pulse 96   Temp 98.9 F (37.2 C) (Oral)   Resp 18   Wt 140 lb (63.5 kg)   SpO2 98%   BMI 24.03 kg/m   Visual Acuity Right Eye Distance:   Left Eye Distance:   Bilateral Distance:    Right Eye Near:   Left Eye Near:    Bilateral Near:     Physical Exam Vitals and nursing note reviewed.  Constitutional:      General: She is not in acute distress.    Appearance: She is well-developed.  HENT:     Head: Normocephalic and atraumatic.     Right Ear: Tympanic membrane normal.     Left Ear: Tympanic membrane normal.     Nose: Nose normal.     Mouth/Throat:     Mouth: Mucous membranes are moist.     Pharynx: Oropharynx is clear.  Eyes:     Conjunctiva/sclera: Conjunctivae normal.  Cardiovascular:     Rate and Rhythm: Normal rate and regular rhythm.     Heart sounds: No murmur.  Pulmonary:     Effort: Pulmonary effort is normal. No respiratory distress.     Breath sounds: Rhonchi present. No wheezing.     Comments: Few scattered rhonchi in upper airway which clear easily with cough and deep  breathing. Abdominal:     Palpations: Abdomen is soft.      Tenderness: There is no abdominal tenderness.  Musculoskeletal:     Cervical back: Neck supple.  Skin:    General: Skin is warm and dry.     Findings: No rash.  Neurological:     General: No focal deficit present.     Mental Status: She is alert and oriented to person, place, and time.  Psychiatric:        Mood and Affect: Mood normal.        Behavior: Behavior normal.      UC Treatments / Results  Labs (all labs ordered are listed, but only abnormal results are displayed) Labs Reviewed - No data to display  EKG   Radiology No results found.  Procedures Procedures (including critical care time)  Medications Ordered in UC Medications - No data to display  Initial Impression / Assessment and Plan / UC Course  I have reviewed the triage vital signs and the nursing notes.  Pertinent labs & imaging results that were available during my care of the patient were reviewed by me and considered in my medical decision making (see chart for details).   Acute bronchitis.  Treating with Zithromax, albuterol inhaler, Tessalon Perles.  Instructed patient to follow-up with her PCP if her symptoms or not improving.  Patient declines COVID testing today.  Patient agrees to plan of care.      Final Clinical Impressions(s) / UC Diagnoses   Final diagnoses:  Acute bronchitis, unspecified organism     Discharge Instructions     Use the albuterol inhaler and take the Zithromax as directed.  Tessalon Perles as needed for cough.  Follow-up with your primary care provider if your symptoms are not improving.        ED Prescriptions    Medication Sig Dispense Auth. Provider   albuterol (VENTOLIN HFA) 108 (90 Base) MCG/ACT inhaler Inhale 2 puffs into the lungs every 4 (four) hours as needed for wheezing or shortness of breath. 18 g Sharion Balloon, NP   azithromycin (ZITHROMAX) 250 MG tablet Take 1 tablet (250 mg total) by mouth daily. Take first 2 tablets together, then 1 every day  until finished. 6 tablet Sharion Balloon, NP   benzonatate (TESSALON) 100 MG capsule Take 1 capsule (100 mg total) by mouth 3 (three) times daily as needed for cough. 21 capsule Sharion Balloon, NP     PDMP not reviewed this encounter.   Sharion Balloon, NP 02/20/20 1441

## 2020-02-23 NOTE — Progress Notes (Deleted)
New patient visit  {the wildcard for the provider attestation at the bottom of the note has been replaced with a short label [provider attestation**:1] to remind the CMA NOT to document over the wildcard. You can F2 to the label then type directly over it with the provider attestation dot phrase:1}   Patient: Isabel [name]   DOB: Nov 21, 1990   29 y.o. Female  MRN: [id number] Visit Date: 02/23/2020  Today's healthcare provider: Wellington Hampshire Flinchum, FNP  Subjective:   No chief complaint on file.   LATEKA [name] is a 29 y.o. female who presents today as a new patient to establish care.  HPI  -pap done on 07/2018 (see notes)   -patient last seen at urgent care 02/20/20 for  bronchitis (see notes)  Past Medical History:  Diagnosis Date  . Dysmenorrhea   . Family history of breast cancer in mother   . Genetic testing 04/25/18   PALB2 analysis @ [facility] - Familial pathogenic PALB2 mutation detected  . Migraines   . 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 CHOLECYSTECTOMY     Family Status  Relation Name Status  . Mother  Alive  . Father  Alive  . Other  Alive  . MGM  (Not Specified)  . MGF  Alive  . PGM  Deceased  . PGF  Alive  . Mat Aunt  (Not Specified)  . Neg Hx  (Not Specified)   Family History  Problem Relation Age of Onset  . Breast cancer Mother 66       currently 87  . Hypertension Father   . Arthritis Other   . Diabetes Other   . Cancer Other   . Diabetes Maternal Grandmother   . Diabetes Maternal Grandfather   . Diabetes Paternal Grandmother   . Diabetes Paternal Grandfather   . Breast cancer Maternal Aunt 26       currently 41; PALB2 mutation  . Colon cancer Neg Hx   . Heart disease Neg Hx    Social History   Socioeconomic History  . Marital status: Single    Spouse name: Not on file  . Number of children: Not on file  .  Years of education: 49  . Highest education level: Not on file  Occupational History  . Not on file  Tobacco Use  . Smoking status: Current Every Day Smoker    Packs/day: 0.25    Types: Cigarettes  . Smokeless tobacco: Never Used  Substance and Sexual Activity  . Alcohol use: No  . Drug use: Yes    Types: Marijuana    Comment: last smoked - 01/23/2016- helps with her cramps- smokes weekly  . Sexual activity: Yes    Birth control/protection: None, Condom, I.U.D.    Comment: Husband plans vasectomy   Other Topics Concern  . Not on file  Social History Narrative  . Not on file   Social Determinants of Health   Financial Resource Strain:   . Difficulty of Paying Living Expenses:   Food Insecurity:   . Worried About Charity fundraiser in the Last Year:   . Arboriculturist in the Last Year:   Transportation Needs:   . Film/video editor (Medical):   Marland Kitchen Lack of Transportation (Non-Medical):   Physical Activity:   . Days of Exercise per Week:   . Minutes of Exercise per Session:  Stress:   . Feeling of Stress :   Social Connections:   . Frequency of Communication with Friends and Family:   . Frequency of Social Gatherings with Friends and Family:   . Attends Religious Services:   . Active Member of Clubs or Organizations:   . Attends Archivist Meetings:   Marland Kitchen Marital Status:    Outpatient Medications Prior to Visit  Medication Sig  . albuterol (VENTOLIN HFA) 108 (90 Base) MCG/ACT inhaler Inhale 2 puffs into the lungs every 4 (four) hours as needed for wheezing or shortness of breath.  Marland Kitchen azithromycin (ZITHROMAX) 250 MG tablet Take 1 tablet (250 mg total) by mouth daily. Take first 2 tablets together, then 1 every day until finished.  . benzonatate (TESSALON) 100 MG capsule Take 1 capsule (100 mg total) by mouth 3 (three) times daily as needed for cough.  . gabapentin (NEURONTIN) 100 MG capsule Take 2 capsules (200 mg total) by mouth 3 (three) times daily.  Marland Kitchen  levonorgestrel (MIRENA) 20 MCG/24HR IUD 1 each by Intrauterine route once.  . predniSONE (DELTASONE) 20 MG tablet Take 2 tablets (40 mg total) by mouth daily with breakfast.  . traMADol (ULTRAM) 50 MG tablet Take 1 tablet (50 mg total) by mouth every 6 (six) hours as needed for moderate pain. May take up to 2 tabs q 6 hr for severe pain   No facility-administered medications prior to visit.   Allergies  Allergen Reactions  . Amoxicillin Hives  . Penicillins Hives    Patient Care Team: [name], MD as PCP - General (Obstetrics and Gynecology)  Review of Systems  {Show previous labs (optional):23779::" "}   Objective:    There were no vitals taken for this visit. Physical Exam ***  No results found for any visits on 02/26/20.    Assessment & Plan:    ***  No follow-ups on file.     {provider attestation***:1}   Marcille Buffy, [place] [phone] (phone) [phone] (fax)  [place]

## 2020-02-24 ENCOUNTER — Emergency Department
Admission: EM | Admit: 2020-02-24 | Discharge: 2020-02-24 | Disposition: A | Discharge status: Home / Self Care | Payer: Medicaid Other | Attending: Student | Admitting: Student

## 2020-02-24 ENCOUNTER — Emergency Department: Payer: Medicaid Other

## 2020-02-24 ENCOUNTER — Other Ambulatory Visit: Payer: Self-pay

## 2020-02-24 DIAGNOSIS — R000 Tachycardia, unspecified: Secondary | ICD-10-CM | POA: Insufficient documentation

## 2020-02-24 DIAGNOSIS — R059 Cough, unspecified: Secondary | ICD-10-CM

## 2020-02-24 DIAGNOSIS — R05 Cough: Secondary | ICD-10-CM | POA: Insufficient documentation

## 2020-02-24 DIAGNOSIS — R1013 Epigastric pain: Secondary | ICD-10-CM | POA: Insufficient documentation

## 2020-02-24 DIAGNOSIS — F17210 Nicotine dependence, cigarettes, uncomplicated: Secondary | ICD-10-CM | POA: Insufficient documentation

## 2020-02-24 DIAGNOSIS — F1210 Cannabis abuse, uncomplicated: Secondary | ICD-10-CM | POA: Insufficient documentation

## 2020-02-24 DIAGNOSIS — Z79899 Other long term (current) drug therapy: Secondary | ICD-10-CM | POA: Insufficient documentation

## 2020-02-24 DIAGNOSIS — R0602 Shortness of breath: Secondary | ICD-10-CM | POA: Insufficient documentation

## 2020-02-24 LAB — URINE DRUG SCREEN, QUALITATIVE (ARMC ONLY)
Amphetamines, Ur Screen: NOT DETECTED
Barbiturates, Ur Screen: NOT DETECTED
Benzodiazepine, Ur Scrn: NOT DETECTED
Cannabinoid 50 Ng, Ur ~~LOC~~: POSITIVE — AB
Cocaine Metabolite,Ur ~~LOC~~: NOT DETECTED
MDMA (Ecstasy)Ur Screen: NOT DETECTED
Methadone Scn, Ur: NOT DETECTED
Opiate, Ur Screen: NOT DETECTED
Phencyclidine (PCP) Ur S: NOT DETECTED
Tricyclic, Ur Screen: NOT DETECTED

## 2020-02-24 LAB — FIBRIN DERIVATIVES D-DIMER (ARMC ONLY): Fibrin derivatives D-dimer (ARMC): 197.61 ng/mL (FEU) (ref 0.00–499.00)

## 2020-02-24 LAB — TROPONIN I (HIGH SENSITIVITY)
Troponin I (High Sensitivity): <2 ng/L (ref <18)
Troponin I (High Sensitivity): <2 ng/L (ref <18)

## 2020-02-24 LAB — BASIC METABOLIC PANEL
Anion gap: 9 (ref 5–15)
BUN: 10 mg/dL (ref 6–20)
CO2: 23 mmol/L (ref 22–32)
Calcium: 9.3 mg/dL (ref 8.9–10.3)
Chloride: 109 mmol/L (ref 98–111)
Creatinine, Ser: 0.66 mg/dL (ref 0.44–1.00)
GFR calc Af Amer: >60 mL/min (ref >60)
GFR calc non Af Amer: >60 mL/min (ref >60)
Glucose, Bld: 124 mg/dL — ABNORMAL HIGH (ref 70–99)
Potassium: 4.1 mmol/L (ref 3.5–5.1)
Sodium: 141 mmol/L (ref 135–145)

## 2020-02-24 LAB — CBC
HCT: 44.7 % (ref 36.0–46.0)
Hemoglobin: 16 g/dL — ABNORMAL HIGH (ref 12.0–15.0)
MCH: 32.5 pg (ref 26.0–34.0)
MCHC: 35.8 g/dL (ref 30.0–36.0)
MCV: 90.9 fL (ref 80.0–100.0)
Platelets: 247 10*3/uL (ref 150–400)
RBC: 4.92 MIL/uL (ref 3.87–5.11)
RDW: 13 % (ref 11.5–15.5)
WBC: 13.6 10*3/uL — ABNORMAL HIGH (ref 4.0–10.5)
nRBC: 0 % (ref 0.0–0.2)

## 2020-02-24 IMAGING — CR DG CHEST 2V
1 series · 2 of 2 positions shown · non-contrast
Comparison: Radiograph [DATE]

CLINICAL DATA: Chest pain, right neck pain described as burning,
diagnosed with bronchitis on [REDACTED], started on RONLOR

EXAM:
CHEST - 2 VIEW

[Series 1: w chest pa · 0.14mm/px · 2 of 2 slices shown]
[im 1/2]
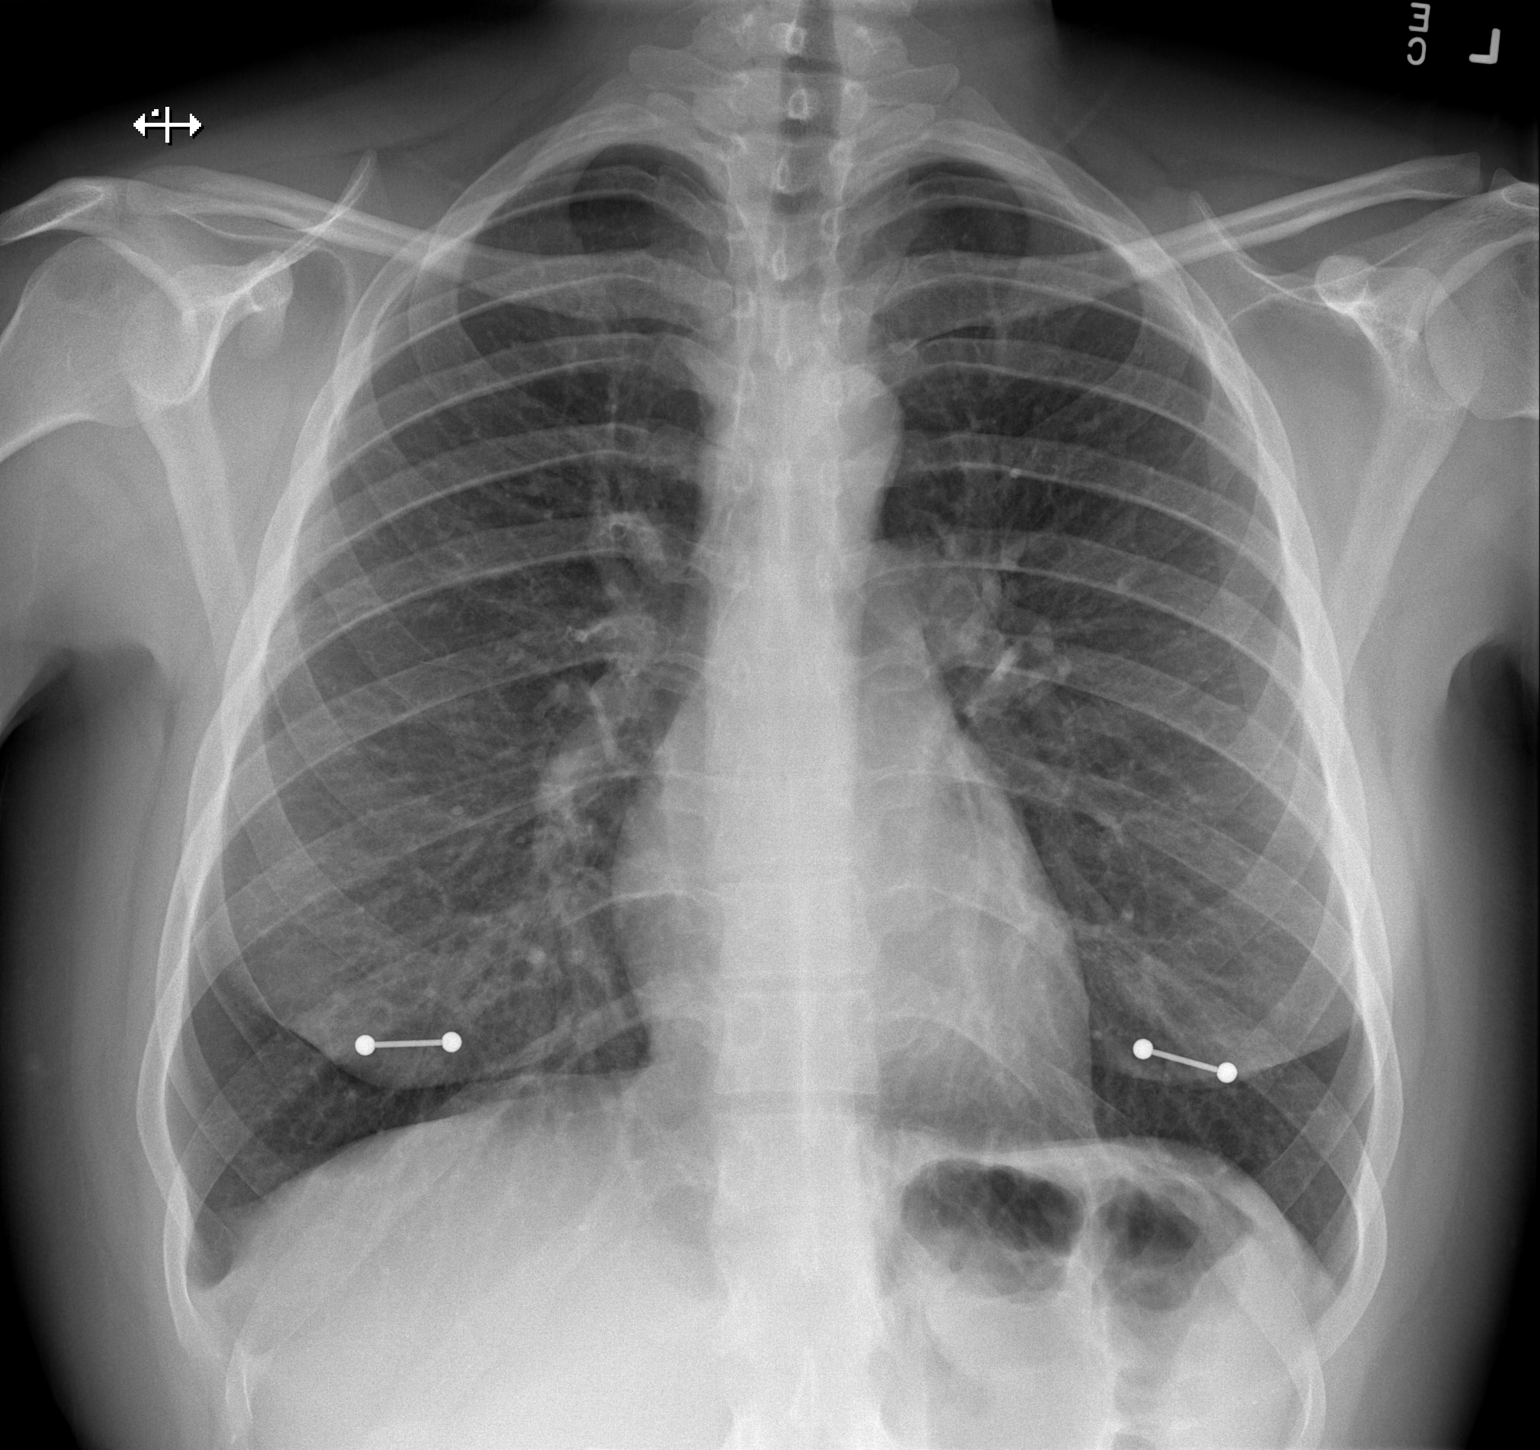
[im 2/2]
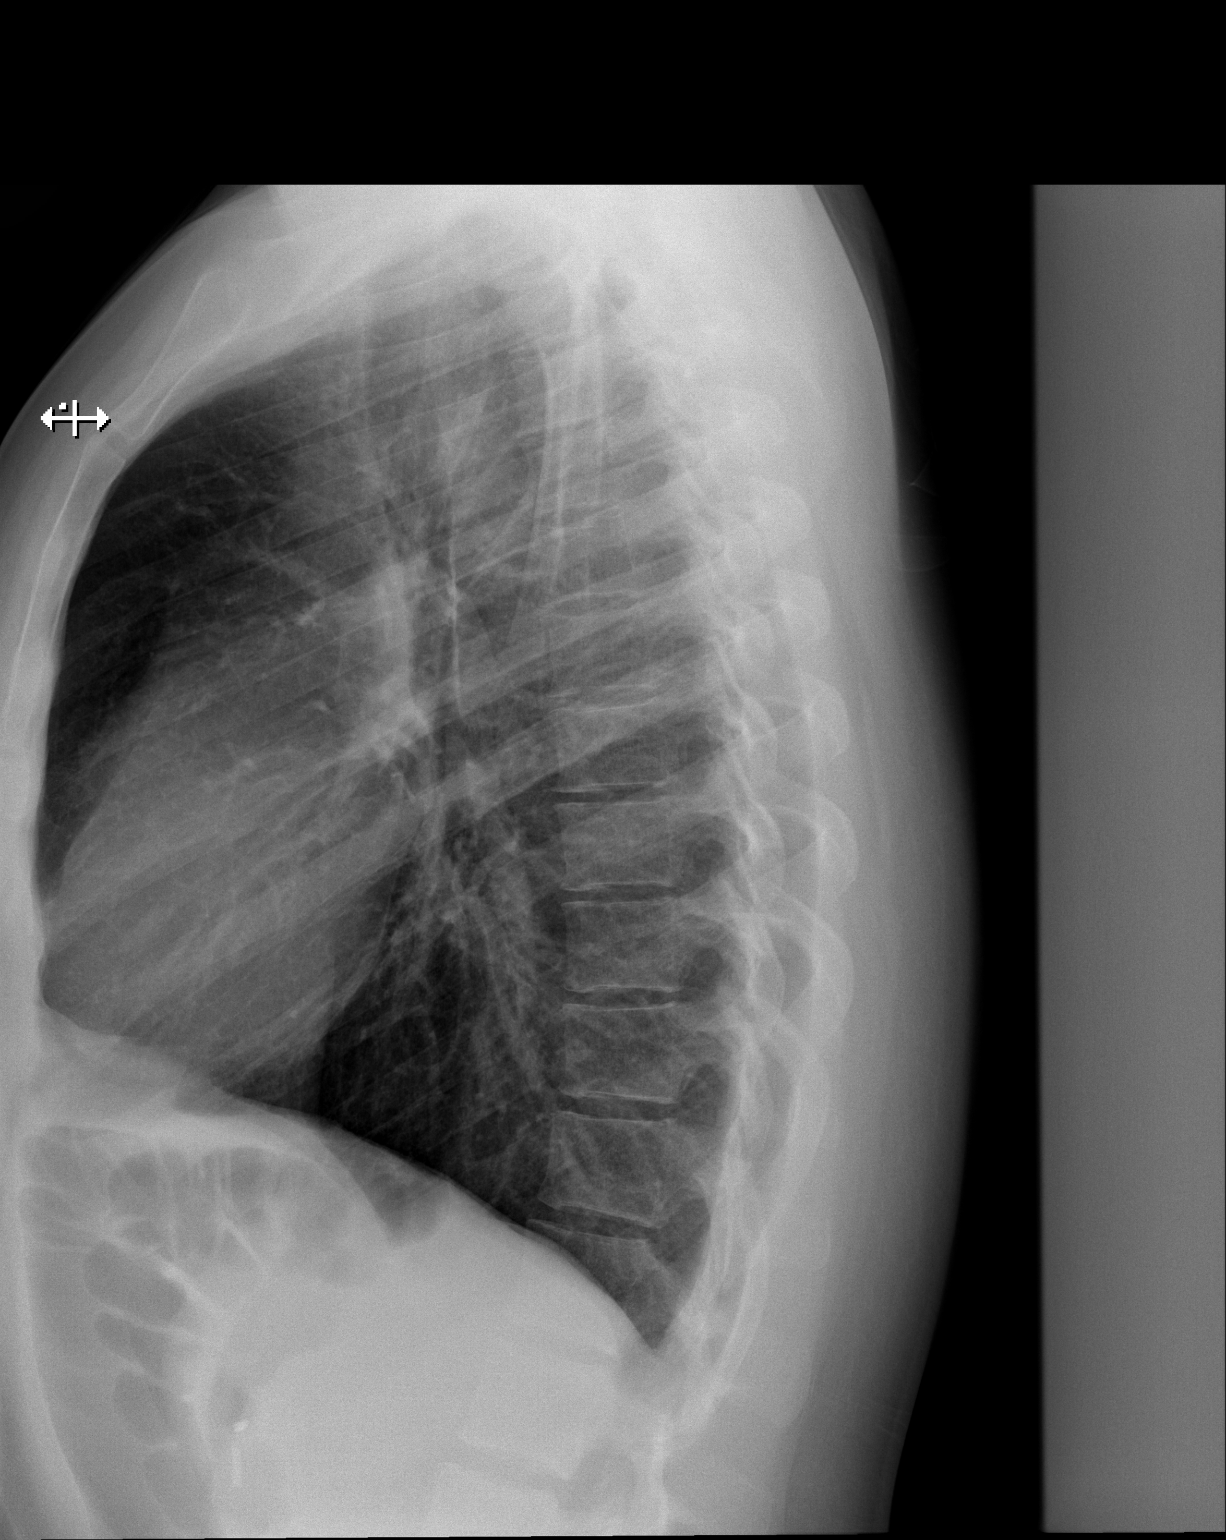

[2 of 2 positions shown; findings below may reference images not displayed]

FINDINGS: No consolidation or convincing features of edema. Slight blunting of
the costophrenic sulcus on the right is favored to reflect
inspiratory effort. No visible pneumothorax or effusion. Pulmonary
vascularity is normally distributed. The cardiomediastinal contours
are unremarkable. No acute osseous or soft tissue abnormality.
Bilateral metallic nipple ornamentation.
IMPRESSION: No acute cardiopulmonary abnormality.

## 2020-02-24 MED ORDER — DOXYCYCLINE HYCLATE 100 MG PO CAPS: 100.0000 mg | ORAL_CAPSULE | Freq: Two times a day (BID) | ORAL | 0 refills | Status: DC

## 2020-02-24 MED ORDER — FAMOTIDINE 20 MG PO TABS: 20.0000 mg | ORAL_TABLET | Freq: Two times a day (BID) | ORAL | 0 refills | Status: DC

## 2020-02-24 NOTE — ED Triage Notes (Signed)
Patient c/o medial chest pressure. Patient c/o right neck pain described as burning. Patient dx with bronchitis Wednesday and started on Zpack.

## 2020-02-24 NOTE — ED Provider Notes (Signed)
[facility] Emergency Department Provider Note  ____________________________________________   First MD Initiated Contact with Patient 02/24/20 0411     (approximate)  I have reviewed the triage vital signs and the nursing notes.  History  Chief Complaint Chest Pain    HPI Isabel [name] is a 29 y.o. female with past medical history as below who presents to the emergency department for heart pounding sensation, epigastric discomfort, shortness of breath, nasal congestion/URI symptoms, and right-sided lymphadenopathy.  Patient states symptoms started with nasal congestion and cough about 1 week ago.  She typically gets bronchitis yearly and is treated with steroids and antibiotics.  She was seen at urgent care and given a prednisone course and azithromycin.  She states despite this she has not had significant improvement in her symptoms and states she usually requires something other than a Z-Pak for resolution of her symptoms.  After taking the prednisone she has resultant heart pounding sensation as well as discomfort in her epigastrium.  She reports continued cough and some central chest discomfort with this.  She is also noticed a swollen lymph node on her right side neck.  Denies any sick contacts or known COVID exposures.   Past Medical Hx Past Medical History:  Diagnosis Date  . Dysmenorrhea   . Family history of breast cancer in mother   . Genetic testing 04/25/18   PALB2 analysis @ [facility] - Familial pathogenic PALB2 mutation detected  . Migraines   . 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 Genetic testing   . Monoallelic mutation of PALB2 gene   . Family history of breast cancer in mother   . 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 Positive urine drug screen 03/27/2016  . Tobacco user 02/26/2016  . Irregular  periods/menstrual cycles 02/26/2016  . Neck pain 02/22/2012  . LOW BACK PAIN 08/28/2008  . VASOVAGAL SYNCOPE 08/28/2008    Past Surgical Hx Past Surgical History:  Procedure Laterality Date  . CHOLECYSTECTOMY      Medications Prior to Admission medications   Medication Sig Start Date End Date Taking? Authorizing Provider  albuterol (VENTOLIN HFA) 108 (90 Base) MCG/ACT inhaler Inhale 2 puffs into the lungs every 4 (four) hours as needed for wheezing or shortness of breath. 02/20/20   Sharion Balloon, NP  azithromycin (ZITHROMAX) 250 MG tablet Take 1 tablet (250 mg total) by mouth daily. Take first 2 tablets together, then 1 every day until finished. 02/20/20   Sharion Balloon, NP  benzonatate (TESSALON) 100 MG capsule Take 1 capsule (100 mg total) by mouth 3 (three) times daily as needed for cough. 02/20/20   Sharion Balloon, NP  gabapentin (NEURONTIN) 100 MG capsule Take 2 capsules (200 mg total) by mouth 3 (three) times daily. 09/20/19   [name], MD  levonorgestrel (MIRENA) 20 MCG/24HR IUD 1 each by Intrauterine route once.    [provider]  predniSONE (DELTASONE) 20 MG tablet Take 2 tablets (40 mg total) by mouth daily with breakfast. 02/12/20   Jaynee Eagles, PA-C  traMADol (ULTRAM) 50 MG tablet Take 1 tablet (50 mg total) by mouth every 6 (six) hours as needed for moderate pain. May take up to 2 tabs q 6 hr for severe pain 10/09/19   [name], MD    Allergies Amoxicillin and Penicillins  Family Hx Family History  Problem  Relation Age of Onset  . Breast cancer Mother 88       currently 19  . Hypertension Father   . Arthritis Other   . Diabetes Other   . Cancer Other   . Diabetes Maternal Grandmother   . Diabetes Maternal Grandfather   . Diabetes Paternal Grandmother   . Diabetes Paternal Grandfather   . Breast cancer Maternal Aunt 12       currently 51; PALB2 mutation  . Colon cancer Neg Hx   . Heart disease Neg Hx     Social Hx Social History   Tobacco  Use  . Smoking status: Current Every Day Smoker    Packs/day: 0.25    Types: Cigarettes  . Smokeless tobacco: Never Used  Substance Use Topics  . Alcohol use: No  . Drug use: Yes    Types: Marijuana    Comment: last smoked - 01/23/2016- helps with her cramps- smokes weekly     Review of Systems  Constitutional: Negative for fever. Negative for chills. Eyes: Negative for visual changes. ENT: Negative for sore throat.  Positive for lymphadenopathy. Cardiovascular: Positive for chest pain. Respiratory: Positive for cough. Gastrointestinal: Negative for nausea. Negative for vomiting.  Positive for epigastric pain. Genitourinary: Negative for dysuria. Musculoskeletal: Negative for leg swelling. Skin: Negative for rash. Neurological: Negative for headaches.   Physical Exam  Vital Signs: ED Triage Vitals  Enc Vitals Group     BP 02/24/20 0127 134/84     Pulse Rate 02/24/20 0127 (!) 133     Resp 02/24/20 0127 19     Temp 02/24/20 0127 98.7 F (37.1 C)     Temp src --      SpO2 02/24/20 0127 99 %     Weight 02/24/20 0128 140 lb (63.5 kg)     Height 02/24/20 0128 '5\' 4"'  (1.626 m)     Head Circumference --      Peak Flow --      Pain Score 02/24/20 0127 7     Pain Loc --      Pain Edu? --      Excl. in [place]? --     Constitutional: Alert and oriented. Well appearing. NAD.  Head: Normocephalic. Atraumatic. Eyes: Conjunctivae clear. Sclera anicteric. Pupils equal and symmetric. Nose: No masses or lesions.  Mild nasal congestion. Mouth/Throat: Wearing mask.  No erythema of the posterior oropharynx.  No tonsillar hypertrophy or exudates. Neck: No stridor. Trachea midline.  Shotty cervical lymphadenopathy. Cardiovascular: Normal rate, regular rhythm. Extremities well perfused. Respiratory: Normal respiratory effort.  Rare, scattered coarse expiratory lung sound, otherwise lungs CTAB. Gastrointestinal: Soft. Non-distended.  Mild epigastric discomfort, no rebound, guarding, or  rigidity.  Remainder of abdomen is soft and nontender. Genitourinary: Deferred. Musculoskeletal: No lower extremity edema. No deformities. Neurologic:  Normal speech and language. No gross focal or lateralizing neurologic deficits are appreciated.  Skin: Skin is warm, dry and intact. No rash noted. Psychiatric: Mood and affect are appropriate for situation.  EKG  Personally reviewed and interpreted by myself.   Date: 02/24/2020 Time: 0120 Rate: 117 Rhythm: Sinus Axis: Normal Intervals: Within normal limits T wave inversion lead III And is tachycardia No STEMI    Radiology  Personally reviewed available imaging myself.   CXR IMPRESSION:  No acute cardiopulmonary abnormality.     Procedures  Procedure(s) performed (including critical care):  Procedures   Initial Impression / Assessment and Plan / MDM / ED Course  29 y.o. female who presents to the  ED for cough, epigastric discomfort, nasal congestion, lymphadenopathy.  Recently completed a course of prednisone and azithromycin from urgent care for bronchitis.  Ddx: viral URI, bronchitis, gastritis/esophagitis due to prednisone, pleurisy, PE  Will plan for labs, imaging, EKG  Labs reveal mildly elevated WBC count at 13.6, but this could be related to her prednisone use as well.  EKG demonstrates sinus tachycardia, HR improved by my exam and on recheck.  Troponin x 2 negative.  D-dimer negative.  CXR negative.  As such, feel patient is stable for discharge with outpatient follow-up.  Given the persistence of her symptoms and lack of response to azithromycin will plan for short course of doxycycline.  Also provided Rx for famotidine given suspicion for steroid-induced gastritis.  Patient voices understanding and is comfortable with the plan and discharge.  Given return precautions.   _______________________________   As part of my medical decision making I have reviewed available labs, radiology tests.   Final  Clinical Impression(s) / ED Diagnosis  Final diagnoses:  Cough  Epigastric pain  Tachycardia       Note:  This document was prepared using Dragon voice recognition software and may include unintentional dictation errors.   Lilia Pro., MD 02/24/20 [phone]

## 2020-02-24 NOTE — Discharge Instructions (Signed)
Thank you for letting us take care of you in the emergency department today.   Please continue to take any regular, prescribed medications.   New medications we have prescribed:  Doxycycline for bronchitis Famotidine to help with your reflux, likely exacerbated by the prednisone   Please follow up with: Your primary care doctor to review your ER visit and follow up on your symptoms.    Please return to the ER for any new or worsening symptoms.

## 2020-02-26 ENCOUNTER — Ambulatory Visit
Admission: RE | Admit: 2020-02-26 | Discharge: 2020-02-26 | Disposition: A | Discharge status: Home / Self Care | Payer: Self-pay | Source: Ambulatory Visit | Attending: Adult Health | Admitting: Adult Health

## 2020-02-26 ENCOUNTER — Other Ambulatory Visit
Admission: RE | Admit: 2020-02-26 | Discharge: 2020-02-26 | Disposition: A | Discharge status: Home / Self Care | Payer: Medicaid Other | Source: Ambulatory Visit | Attending: Adult Health | Admitting: Adult Health

## 2020-02-26 ENCOUNTER — Telehealth: Payer: Self-pay

## 2020-02-26 ENCOUNTER — Encounter: Payer: Self-pay | Admitting: Adult Health

## 2020-02-26 ENCOUNTER — Telehealth (INDEPENDENT_AMBULATORY_CARE_PROVIDER_SITE_OTHER): Payer: Self-pay | Admitting: Adult Health

## 2020-02-26 ENCOUNTER — Other Ambulatory Visit: Payer: Self-pay

## 2020-02-26 VITALS — Ht 64.0 in | Wt 140.0 lb

## 2020-02-26 DIAGNOSIS — R1013 Epigastric pain: Secondary | ICD-10-CM

## 2020-02-26 DIAGNOSIS — K449 Diaphragmatic hernia without obstruction or gangrene: Secondary | ICD-10-CM | POA: Insufficient documentation

## 2020-02-26 DIAGNOSIS — R0789 Other chest pain: Secondary | ICD-10-CM | POA: Insufficient documentation

## 2020-02-26 DIAGNOSIS — R05 Cough: Secondary | ICD-10-CM

## 2020-02-26 DIAGNOSIS — R059 Cough, unspecified: Secondary | ICD-10-CM

## 2020-02-26 DIAGNOSIS — R0602 Shortness of breath: Secondary | ICD-10-CM

## 2020-02-26 DIAGNOSIS — Z8616 Personal history of COVID-19: Secondary | ICD-10-CM | POA: Insufficient documentation

## 2020-02-26 DIAGNOSIS — R000 Tachycardia, unspecified: Secondary | ICD-10-CM | POA: Insufficient documentation

## 2020-02-26 DIAGNOSIS — F17210 Nicotine dependence, cigarettes, uncomplicated: Secondary | ICD-10-CM | POA: Insufficient documentation

## 2020-02-26 DIAGNOSIS — Z0184 Encounter for antibody response examination: Secondary | ICD-10-CM | POA: Insufficient documentation

## 2020-02-26 LAB — LIPID PANEL
Cholesterol: 209 mg/dL — ABNORMAL HIGH (ref 0–200)
HDL: 33 mg/dL — ABNORMAL LOW (ref >40)
LDL Cholesterol: 160 mg/dL — ABNORMAL HIGH (ref 0–99)
Total CHOL/HDL Ratio: 6.3 RATIO
Triglycerides: 81 mg/dL (ref <150)
VLDL: 16 mg/dL (ref 0–40)

## 2020-02-26 LAB — CBC WITH DIFFERENTIAL/PLATELET
Abs Immature Granulocytes: 0.04 10*3/uL (ref 0.00–0.07)
Basophils Absolute: 0 10*3/uL (ref 0.0–0.1)
Basophils Relative: 0 %
Eosinophils Absolute: 0.1 10*3/uL (ref 0.0–0.5)
Eosinophils Relative: 1 %
HCT: 43.5 % (ref 36.0–46.0)
Hemoglobin: 15.5 g/dL — ABNORMAL HIGH (ref 12.0–15.0)
Immature Granulocytes: 0 %
Lymphocytes Relative: 16 %
Lymphs Abs: 1.6 10*3/uL (ref 0.7–4.0)
MCH: 32.2 pg (ref 26.0–34.0)
MCHC: 35.6 g/dL (ref 30.0–36.0)
MCV: 90.2 fL (ref 80.0–100.0)
Monocytes Absolute: 0.6 10*3/uL (ref 0.1–1.0)
Monocytes Relative: 6 %
Neutro Abs: 7.8 10*3/uL — ABNORMAL HIGH (ref 1.7–7.7)
Neutrophils Relative %: 77 %
Platelets: 231 10*3/uL (ref 150–400)
RBC: 4.82 MIL/uL (ref 3.87–5.11)
RDW: 12.7 % (ref 11.5–15.5)
WBC: 10.2 10*3/uL (ref 4.0–10.5)
nRBC: 0 % (ref 0.0–0.2)

## 2020-02-26 LAB — COMPREHENSIVE METABOLIC PANEL
ALT: 13 U/L (ref 0–44)
AST: 14 U/L — ABNORMAL LOW (ref 15–41)
Albumin: 4.2 g/dL (ref 3.5–5.0)
Alkaline Phosphatase: 48 U/L (ref 38–126)
Anion gap: 12 (ref 5–15)
BUN: 13 mg/dL (ref 6–20)
CO2: 22 mmol/L (ref 22–32)
Calcium: 9.3 mg/dL (ref 8.9–10.3)
Chloride: 104 mmol/L (ref 98–111)
Creatinine, Ser: 0.8 mg/dL (ref 0.44–1.00)
GFR calc Af Amer: >60 mL/min (ref >60)
GFR calc non Af Amer: >60 mL/min (ref >60)
Glucose, Bld: 92 mg/dL (ref 70–99)
Potassium: 4.4 mmol/L (ref 3.5–5.1)
Sodium: 138 mmol/L (ref 135–145)
Total Bilirubin: 1 mg/dL (ref 0.3–1.2)
Total Protein: 7.3 g/dL (ref 6.5–8.1)

## 2020-02-26 LAB — LIPASE, BLOOD: Lipase: 30 U/L (ref 11–51)

## 2020-02-26 LAB — AMYLASE: Amylase: 44 U/L (ref 28–100)

## 2020-02-26 LAB — FIBRIN DERIVATIVES D-DIMER (ARMC ONLY): Fibrin derivatives D-dimer (ARMC): 271.86 ng/mL (FEU) (ref 0.00–499.00)

## 2020-02-26 LAB — TSH: TSH: 1.338 u[IU]/mL (ref 0.350–4.500)

## 2020-02-26 IMAGING — CT CT ANGIO CHEST
2 of 6 series · 19 of 46 positions shown · IV contrast (APPLIED)
Comparison: Chest x-ray dated [DATE]

CLINICAL DATA: Chest pain and shortness of breath. Cough.

EXAM:
CT ANGIOGRAPHY CHEST WITH CONTRAST
TECHNIQUE: Multidetector CT imaging of the chest was performed using the
standard protocol during bolus administration of intravenous
contrast. Multiplanar CT image reconstructions and MIPs were
obtained to evaluate the vascular anatomy.
CONTRAST:  75mL OMNIPAQUE IOHEXOL 350 MG/ML SOLN

[Series 5: thins · axial · 0.68mm/px · z∈[-569,-295]mm · 16 of 302 slices shown]
[im 14/302  lung]
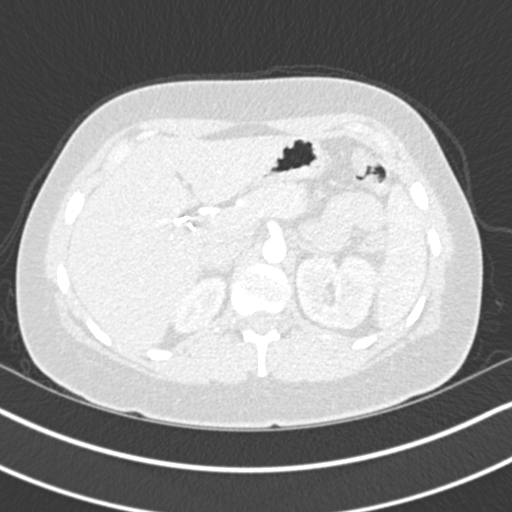
[im 40/302  soft-tissue]
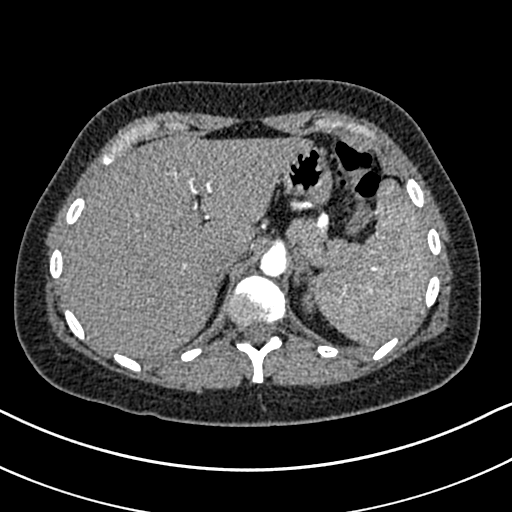
[im 53/302  lung]
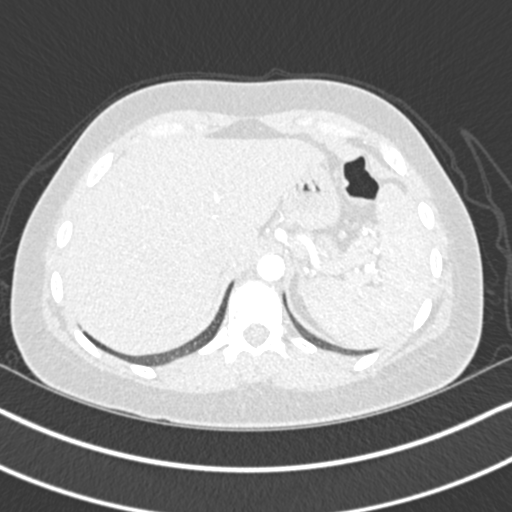
[im 66/302  soft-tissue]
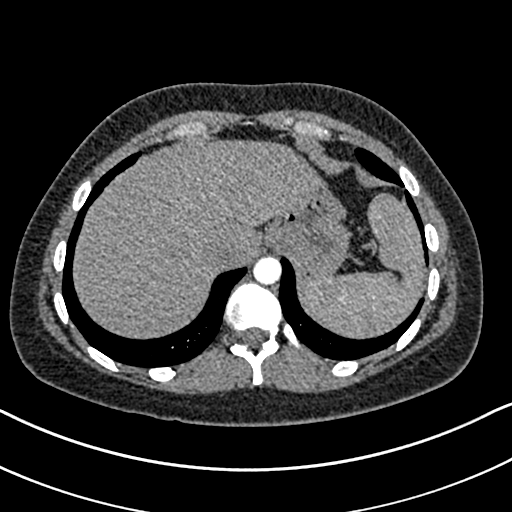
[im 92/302  lung]
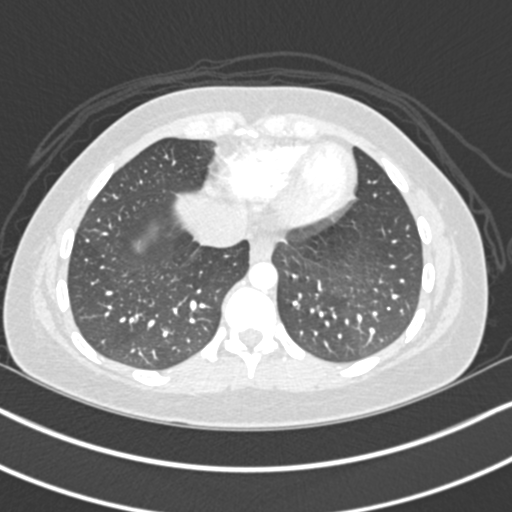
[im 105/302  soft-tissue]
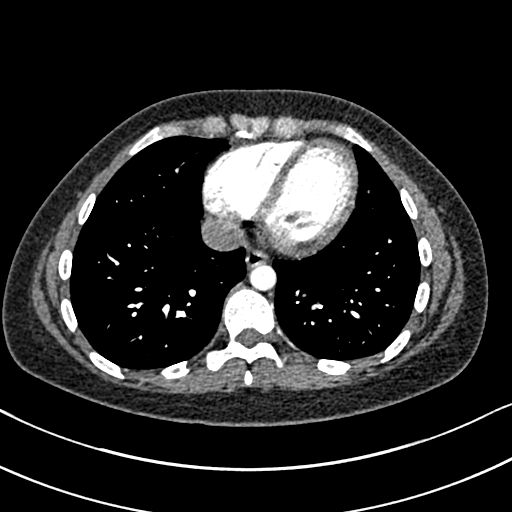
[im 118/302  lung]
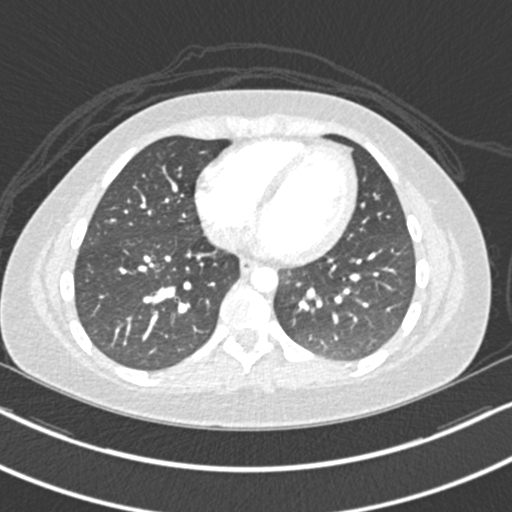
[im 144/302  soft-tissue]
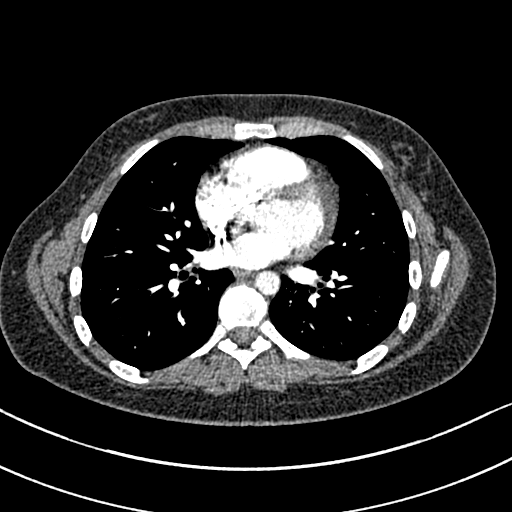
[im 158/302  lung]
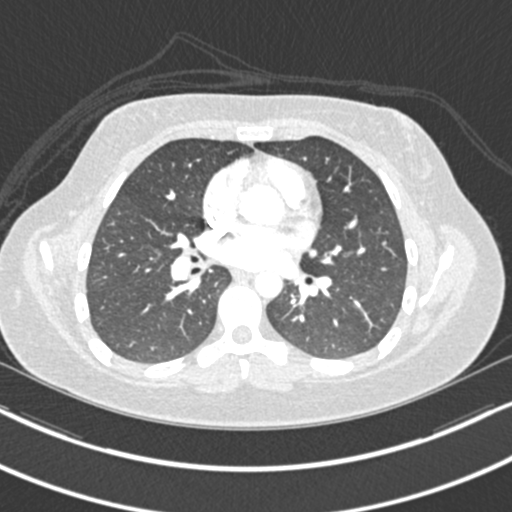
[im 184/302  soft-tissue]
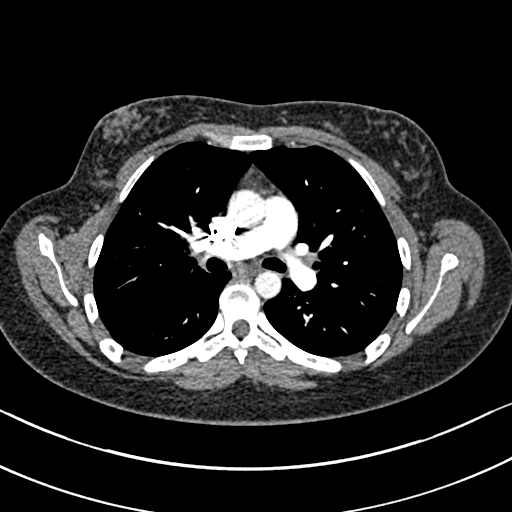
[im 197/302  lung]
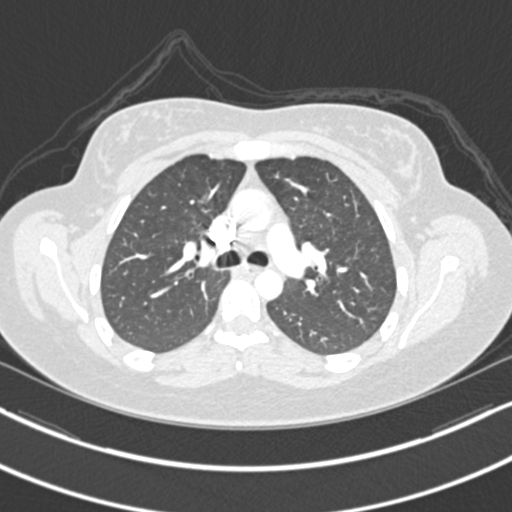
[im 210/302  soft-tissue]
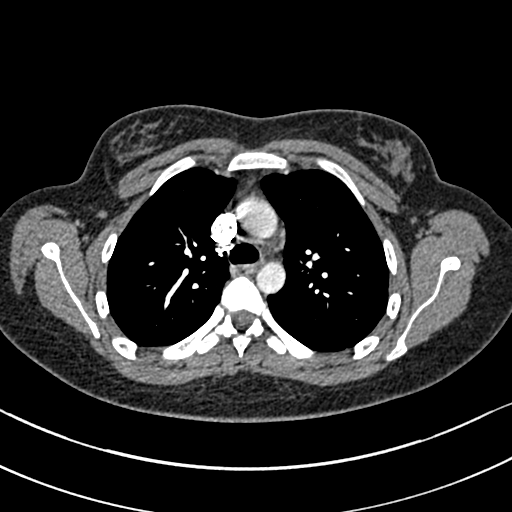
[im 236/302  lung]
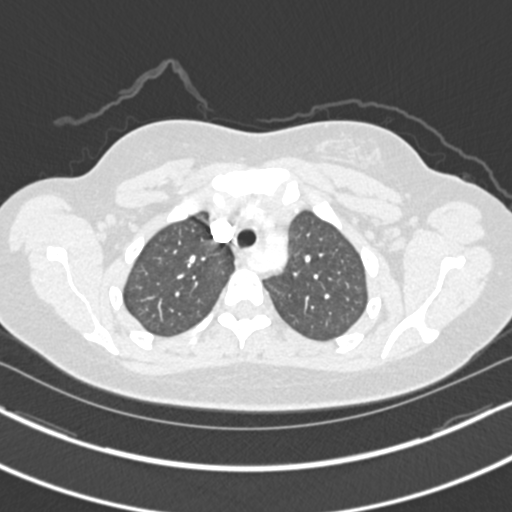
[im 249/302  soft-tissue]
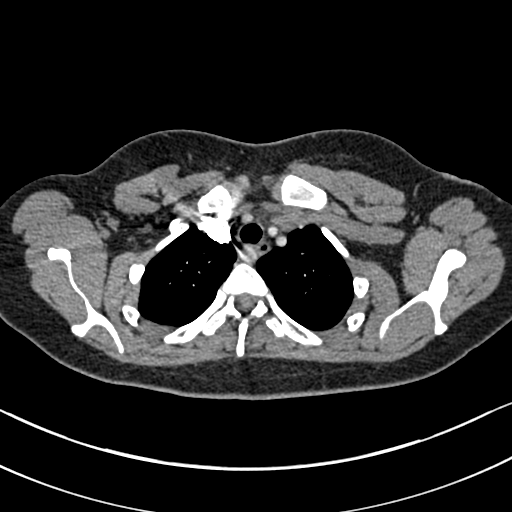
[im 262/302  lung]
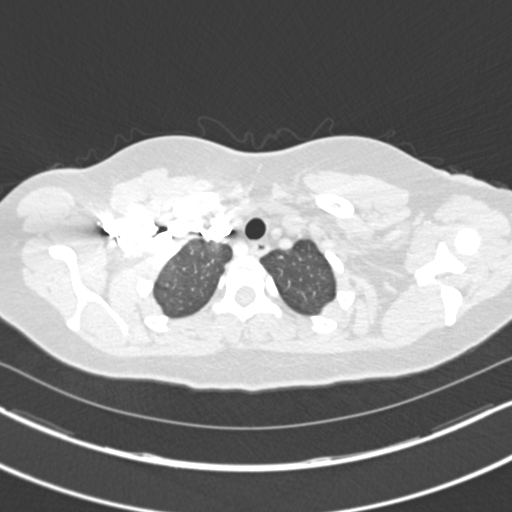
[im 288/302  soft-tissue]
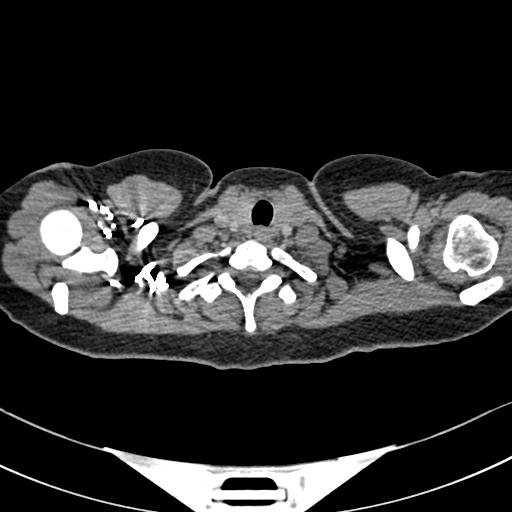

[Series 7: coronal mpr · coronal · 0.59mm/px · 3 of 80 slices shown]
[im 20/80  soft-tissue]
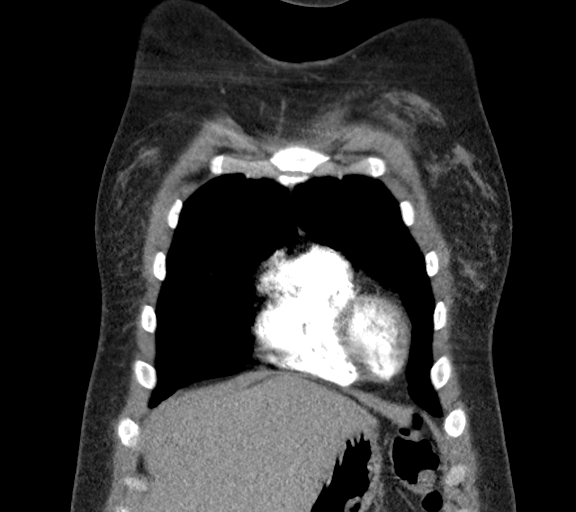
[im 40/80  soft-tissue]
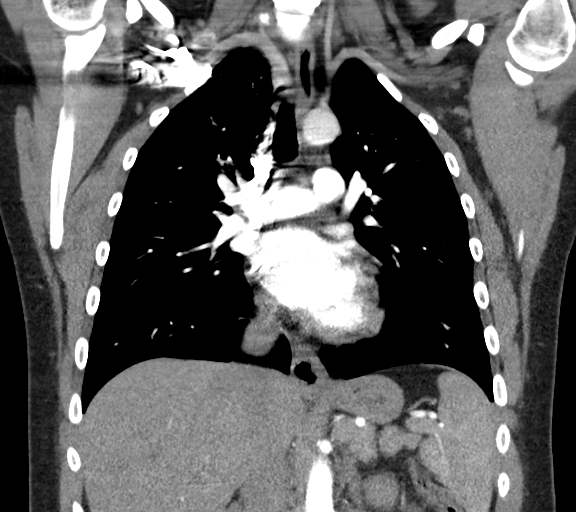
[im 60/80  soft-tissue]
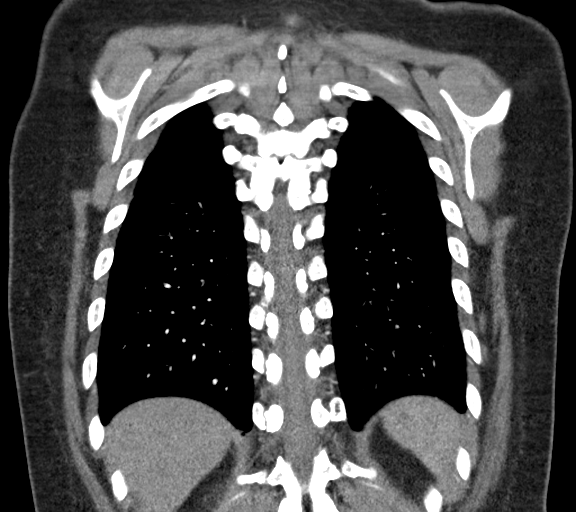

[19 of 46 positions shown; findings below may reference images not displayed]

FINDINGS: Cardiovascular: Satisfactory opacification of the pulmonary arteries
to the segmental level. No evidence of pulmonary embolism. Normal
heart size. No pericardial effusion. Incidental note is made of an
aberrant right subclavian artery.

Mediastinum/Nodes: No enlarged mediastinal, hilar, or axillary lymph
nodes. Thyroid gland and trachea appear normal. Minimal hiatal
hernia.

Lungs/Pleura: Lungs are clear. No pleural effusion or pneumothorax.

Upper Abdomen: Normal.

Musculoskeletal: Normal.

Review of the MIP images confirms the above findings.
IMPRESSION: 1. No pulmonary emboli or other acute abnormalities.
2. Minimal hiatal hernia.
3. Incidental note is made of an aberrant right subclavian artery.

## 2020-02-26 MED ORDER — IOHEXOL 350 MG/ML SOLN
75.0000 mL | Freq: Once | INTRAVENOUS | Status: AC | PRN
  Administered 2020-02-26: 75 mL via INTRAVENOUS

## 2020-02-26 NOTE — Progress Notes (Signed)
MyChart Video Visit    Virtual Visit via Video Note   This visit type was conducted due to national recommendations for restrictions regarding the COVID-19 Pandemic (e.g. social distancing) in an effort to limit this patient's exposure and mitigate transmission in our community. This patient is at least at moderate risk for complications without adequate follow up. This format is felt to be most appropriate for this patient at this time. Physical exam was limited by quality of the video and audio technology used for the visit.   Patient location: in her car  Provider location: Provider: Provider's office at  [facility], [place].     Patient: Isabel [name]   DOB: [birth year]   29 y.o. Female  MRN: [id number] Visit Date: 02/26/2020  Today's Provider: Marcille Buffy, FNP  Subjective:    Chief Complaint  Patient presents with  . Abdominal Pain  . Leg Pain   She reports she was seen on 4/5 /2021 and was given prednisone and did not get better. Acute bronchitis diagnosis on 02/20/2020 -given azithromycin and benzonatate, and inhaler.  She has not used the inhaler- she was concerned she would be more tachycardia, she says heart rate has been 101 since she took the prednisone. Highest she has seen it has been 115 - 120. Denies any palpation.  She reports she has some mild shortness of breath when she takes a deep breath, and she has chest pressure.  EKG in the ER was normal.  He was released home from the ER with doxycycline and Pepcid.  She denies any improvement in any of her symptoms.  She reports she has been up crying at night wandering the symptoms can happen to her if maybe she has a blood clot due to her can consistent pressure in her chest.  She has a 29 year old and this is making her very anxious she reports.   She has taken Pepcid x 2 times.  She was given Doxycycline and PEPCID 20 mg BID as it was thought she had some gastric irritation due to  the prednisone. She reports she has not noticed any improvement on the antibiotics that I have given her the hospital.  I see 3 urgent care visits and one emergency room visit.  She took sinus cold an flu - she stopped it when she got an antibiotic.   She denies any DVT history.  She however has had right lower leg sciatica in the past this is resolved now.  She denies any back pain.  She denies any abdominal pain other than that noted below with the start of her menses today. She has had cholecystomy in 2015.  She denies any leg pain, leg swelling or erythema. She tested positive for marijuana in hospital.   Patient's last menstrual period was 02/26/2020 (exact date).  Mirena IUD is in place and date.   She has mild menstrual cramping, she has history of tright side sciatica and this is not new.   Denies any blood, or dark colored stools.  Denies any nausea or vomiting.  She denies any dizziness lightheadedness or presyncopal or syncopal episodes.  Elevated white blood cell 2 days ago with elevated hemoglobin.  D-dimer in the ER was negative. Denies any pre syncope, syncope, dizziness's, lightheadedness.  Patient  denies any fever, body aches,chills, rash, vomiting, or diarrhea.    Previous labs Lab Results  Component Value Date   WBC 13.6 (H) 02/24/2020   HGB 16.0 (H) 02/24/2020  HCT 44.7 02/24/2020   MCV 90.9 02/24/2020   MCH 32.5 02/24/2020   RDW 13.0 02/24/2020   PLT 247 02/24/2020   Lab Results  Component Value Date   GLUCOSE 124 (H) 02/24/2020   NA 141 02/24/2020   K 4.1 02/24/2020   CL 109 02/24/2020   CO2 23 02/24/2020   BUN 10 02/24/2020   CREATININE 0.66 02/24/2020   GFRNONAA >60 02/24/2020   GFRAA >60 02/24/2020   CALCIUM 9.3 02/24/2020   PROT 6.2 07/19/2019   ALBUMIN 4.3 07/19/2019   LABGLOB 1.9 07/19/2019   AGRATIO 2.3 (H) 07/19/2019   BILITOT 0.3 07/19/2019   ALKPHOS 60 07/19/2019   AST 11 07/19/2019   ALT 8 07/19/2019   ANIONGAP 9 02/24/2020    No results found for: AMYLASE -----------------------------------------------------------------------------------------    Medications: Outpatient Medications Prior to Visit  Medication Sig  . azithromycin (ZITHROMAX) 250 MG tablet Take 1 tablet (250 mg total) by mouth daily. Take first 2 tablets together, then 1 every day until finished.  . doxycycline (VIBRAMYCIN) 100 MG capsule Take 1 capsule (100 mg total) by mouth 2 (two) times daily for 5 days.  . famotidine (PEPCID) 20 MG tablet Take 1 tablet (20 mg total) by mouth 2 (two) times daily.  Marland Kitchen gabapentin (NEURONTIN) 100 MG capsule Take 2 capsules (200 mg total) by mouth 3 (three) times daily.  Marland Kitchen albuterol (VENTOLIN HFA) 108 (90 Base) MCG/ACT inhaler Inhale 2 puffs into the lungs every 4 (four) hours as needed for wheezing or shortness of breath. (Patient not taking: Reported on 02/26/2020)  . benzonatate (TESSALON) 100 MG capsule Take 1 capsule (100 mg total) by mouth 3 (three) times daily as needed for cough. (Patient not taking: Reported on 02/26/2020)  . levonorgestrel (MIRENA) 20 MCG/24HR IUD 1 each by Intrauterine route once.  . predniSONE (DELTASONE) 20 MG tablet Take 2 tablets (40 mg total) by mouth daily with breakfast. (Patient not taking: Reported on 02/26/2020)  . traMADol (ULTRAM) 50 MG tablet Take 1 tablet (50 mg total) by mouth every 6 (six) hours as needed for moderate pain. May take up to 2 tabs q 6 hr for severe pain (Patient not taking: Reported on 02/26/2020)   No facility-administered medications prior to visit.      Review of Systems  Constitutional: Negative.   HENT: Negative.   Eyes: Negative.   Respiratory: Positive for cough and shortness of breath (occasionally, no O2 sat avalible ). Negative for apnea, wheezing and stridor.   Cardiovascular: Negative for chest pain, palpitations and leg swelling.       She denies any leg swelling or pain when asked by provider   Gastrointestinal: Positive for abdominal  distention, abdominal pain (she reports this is her normal nausea/ menstraul cramping as she started her menses today and is bloated as well. ]) and nausea. Negative for anal bleeding, blood in stool, constipation, diarrhea, rectal pain and vomiting.  Endocrine: Negative.   Genitourinary: Negative.   Musculoskeletal: Negative.   Skin: Negative.   Allergic/Immunologic: Negative.   Neurological: Negative.   Hematological: Positive for adenopathy (improved with antibiotics ).  Psychiatric/Behavioral: Negative.         Objective:    Ht 5\' 4"  (1.626 m)   Wt 140 lb (63.5 kg)   LMP 02/26/2020 (Exact Date)   BMI 24.03 kg/m    Physical Exam   Patient is on video and is very tearful regarding how significant her chest burning and pressure is in the center of  her epigastrium.  She also has some mild shortness of breath and chest wall pain.  Patient is alert and oriented and responsive to questions Engages in conversation with provider. Speaks in full sentences without any pauses without any shortness of breath or distress.   Patient was unable to come to the office today, she arrived at the office however she had COVID-19 symptoms that could be consistent with COVID-19 and given the pandemic in the office protocol she was asked to get her car for a virtual visit.    Assessment & Plan:   Heart rate currently is 118.  Suspicion for pulmonary embolus however must be ruled out, other differentials include reflux, duodenal ulcer, COVID-19, pneumonia.  Given cardiac enzymes were negative in the ER 2 days ago low suspicion for cardiac disease. From chart review patient has not been tested for COVID-19, it is advised that she be tested for COVID-19, however her cannot exclude a blood clot given about coagulopathy and COVID-19.  Will order CT angiogram to rule out pulmonary embolus.  She denies any leg pain currently.  Denies any leg swelling. Is advised should any of her symptoms become worse that  she will go back to the emergency department immediately at any time.  Patient will go for stat labs now at the medical mall and will be scheduled for a CT angiogram stat. Will call patient with results however she should go to the emergency room or call 911 immediately if any symptoms change or worsen.    STAT labs at [facility] medical mall.  Orders Placed This Encounter  Procedures  . CT Angio Chest W/Cm &/Or Wo Cm  . CBC with Differential/Platelet  . Comprehensive Metabolic Panel (CMET)  . TSH  . Lipid panel  . D-Dimer, Quantitative  . SARS-CoV-2 Antibody, IgM  . Amylase  . Lipase   Orders Placed This Encounter  Procedures  . CT Angio Chest W/Cm &/Or Wo Cm  . CBC with Differential/Platelet  . Comprehensive Metabolic Panel (CMET)  . TSH  . Lipid panel  . D-Dimer, Quantitative  . SARS-CoV-2 Antibody, IgM  . Amylase  . Lipase   I discussed the assessment and treatment plan with the patient. The patient was provided an opportunity to ask questions and all were answered. The patient agreed with the plan and demonstrated an understanding of the instructions.   The patient was advised to call back or seek an in-person evaluation if the symptoms worsen or if the condition fails to improve as anticipated.   Advised patient call the office or your primary care doctor for an appointment if no improvement within 72 hours or if any symptoms change or worsen at any time  Advised ER or urgent Care if after hours or on weekend. Call 911 for emergency symptoms at any time.Patinet verbalized understanding of all instructions given/reviewed and treatment plan and has no further questions or concerns at this time.     I provided 32 minutes of non-face-to-face time during this encounter.  With the patient and reviewing her chart.  IWellington Hampshire Rodney Wigger, FNP, have reviewed all documentation for this visit. The documentation on 02/26/20 for the exam, diagnosis, procedures, and orders are all  accurate and complete.  Marcille Buffy, [place] [phone] (phone) [phone] (fax)  [place]

## 2020-02-26 NOTE — Telephone Encounter (Signed)
Copied from [place] [phone] [id number]. Topic: General - Inquiry >> Feb 26, 2020  3:34 PM Greggory Keen D wrote: Reason for CRM: Pt called saying she just got her results back from the CT chest on her My Chart and would like someone call her back.  CB#  [phone]

## 2020-02-26 NOTE — Telephone Encounter (Signed)
The patient had gone for labs when orders were put in but the lab did not receive all the orders. Only 2 labs were completed. She is going back to the hospital lab to have the rest of them completed now.

## 2020-02-27 ENCOUNTER — Other Ambulatory Visit: Payer: Self-pay

## 2020-02-27 ENCOUNTER — Ambulatory Visit
Admission: EM | Admit: 2020-02-27 | Discharge: 2020-02-27 | Disposition: A | Discharge status: Home / Self Care | Payer: Medicaid Other | Attending: Urgent Care | Admitting: Urgent Care

## 2020-02-27 ENCOUNTER — Ambulatory Visit
Admission: EM | Admit: 2020-02-27 | Discharge: 2020-02-27 | Disposition: A | Discharge status: Home / Self Care | Payer: Medicaid Other

## 2020-02-27 ENCOUNTER — Encounter: Payer: Self-pay | Admitting: Emergency Medicine

## 2020-02-27 ENCOUNTER — Telehealth: Payer: Self-pay

## 2020-02-27 ENCOUNTER — Other Ambulatory Visit: Payer: Self-pay | Admitting: Adult Health

## 2020-02-27 DIAGNOSIS — R1013 Epigastric pain: Secondary | ICD-10-CM | POA: Insufficient documentation

## 2020-02-27 DIAGNOSIS — F1290 Cannabis use, unspecified, uncomplicated: Secondary | ICD-10-CM

## 2020-02-27 DIAGNOSIS — Z8249 Family history of ischemic heart disease and other diseases of the circulatory system: Secondary | ICD-10-CM | POA: Insufficient documentation

## 2020-02-27 DIAGNOSIS — R590 Localized enlarged lymph nodes: Secondary | ICD-10-CM | POA: Insufficient documentation

## 2020-02-27 DIAGNOSIS — Z803 Family history of malignant neoplasm of breast: Secondary | ICD-10-CM | POA: Insufficient documentation

## 2020-02-27 DIAGNOSIS — Z880 Allergy status to penicillin: Secondary | ICD-10-CM | POA: Insufficient documentation

## 2020-02-27 DIAGNOSIS — K449 Diaphragmatic hernia without obstruction or gangrene: Secondary | ICD-10-CM | POA: Insufficient documentation

## 2020-02-27 DIAGNOSIS — Z833 Family history of diabetes mellitus: Secondary | ICD-10-CM | POA: Insufficient documentation

## 2020-02-27 DIAGNOSIS — F17210 Nicotine dependence, cigarettes, uncomplicated: Secondary | ICD-10-CM | POA: Insufficient documentation

## 2020-02-27 DIAGNOSIS — F419 Anxiety disorder, unspecified: Secondary | ICD-10-CM | POA: Insufficient documentation

## 2020-02-27 DIAGNOSIS — F418 Other specified anxiety disorders: Secondary | ICD-10-CM

## 2020-02-27 DIAGNOSIS — Z8261 Family history of arthritis: Secondary | ICD-10-CM | POA: Insufficient documentation

## 2020-02-27 DIAGNOSIS — Z0184 Encounter for antibody response examination: Secondary | ICD-10-CM | POA: Insufficient documentation

## 2020-02-27 HISTORY — DX: Other specified congenital malformations of peripheral vascular system: Q27.8

## 2020-02-27 LAB — SAR COV2 SEROLOGY (COVID19)AB(IGG),IA: SARS-CoV-2 Ab, IgG: NONREACTIVE

## 2020-02-27 MED ORDER — PANTOPRAZOLE SODIUM 40 MG PO TBEC: 40.0000 mg | DELAYED_RELEASE_TABLET | Freq: Every day | ORAL | 2 refills | Status: DC

## 2020-02-27 MED ORDER — PANTOPRAZOLE SODIUM 40 MG PO TBEC: 40.0000 mg | DELAYED_RELEASE_TABLET | Freq: Every day | ORAL | 0 refills | Status: DC

## 2020-02-27 NOTE — ED Provider Notes (Signed)
[name]    CSN: [id number] Arrival date & time: 02/27/20  1243      History   Chief Complaint Chief Complaint  Patient presents with  . Neck Pain    HPI ALEXX GIAMBRA is a 29 y.o. female.   Patient presents with "burning" sensation in her right ear and swollen lymph node in the right side of her neck.  Patient was seen at [facility] on 02/12/2020 for allergic rhinitis; treated with prednisone.  She was seen again on 02/20/2020 for acute bronchitis; treated with Zithromax, albuterol and Tessalon. She was seen in the ED on 02/24/2020; chest x-ray negative; diagnosed with cough and treated with doxycycline. She had a video visit with her PCP and a chest CT yesterday; the chest CT showed no pulmonary emboli.  Patient states she was told by PCP to discontinue the doxycycline.   She denies fever, chills, sore throat, difficulty swallowing, cough, shortness of breath, vomiting, diarrhea, rash, or other symptoms.    The history is provided by the patient.    Past Medical History:  Diagnosis Date  . Dysmenorrhea   . Family history of breast cancer in mother   . Genetic testing 04/25/18   PALB2 analysis @ [facility] - Familial pathogenic PALB2 mutation detected  . Migraines   . 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 Sensation of chest pressure 02/26/2020  . Cough 02/26/2020  . Shortness of breath 02/26/2020  . Genetic testing   . Monoallelic mutation of PALB2 gene   . Family history of breast cancer in mother   . 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 Positive urine drug screen 03/27/2016  . Tobacco user 02/26/2016  . Irregular periods/menstrual cycles 02/26/2016  . Neck pain 02/22/2012  . Epigastric abdominal pain 09/25/2008  . LOW BACK PAIN 08/28/2008  . VASOVAGAL SYNCOPE 08/28/2008    Past Surgical History:  Procedure Laterality Date  . CHOLECYSTECTOMY        OB History    Gravida  4   Para  2   Term  2   Preterm      AB  2   Living  1     SAB  2   TAB      Ectopic      Multiple  0   Live Births  1            Home Medications    Prior to Admission medications   Medication Sig Start Date End Date Taking? Authorizing Provider  gabapentin (NEURONTIN) 100 MG capsule Take 2 capsules (200 mg total) by mouth 3 (three) times daily. 09/20/19  Yes [name], MD  levonorgestrel (MIRENA) 20 MCG/24HR IUD 1 each by Intrauterine route once.   Yes [provider]  albuterol (VENTOLIN HFA) 108 (90 Base) MCG/ACT inhaler Inhale 2 puffs into the lungs every 4 (four) hours as needed for wheezing or shortness of breath. Patient not taking: Reported on 02/26/2020 02/20/20   Sharion Balloon, NP  doxycycline (VIBRAMYCIN) 100 MG capsule Take 1 capsule (100 mg total) by mouth 2 (two) times daily for 5 days. 02/24/20 02/29/20  Lilia Pro., MD  famotidine (PEPCID) 20 MG tablet Take 1 tablet (20 mg total) by mouth 2 (two) times daily. 02/24/20 03/25/20  Lilia Pro., MD  traMADol (ULTRAM) 50 MG tablet Take 1 tablet (50 mg total) by  mouth every 6 (six) hours as needed for moderate pain. May take up to 2 tabs q 6 hr for severe pain Patient not taking: Reported on 02/26/2020 10/09/19   [name], MD    Family History Family History  Problem Relation Age of Onset  . Breast cancer Mother 64       currently 5  . Hypertension Father   . Arthritis Other   . Diabetes Other   . Cancer Other   . Diabetes Maternal Grandmother   . Diabetes Maternal Grandfather   . Diabetes Paternal Grandmother   . Diabetes Paternal Grandfather   . Breast cancer Maternal Aunt 66       currently 47; PALB2 mutation  . Colon cancer Neg Hx   . Heart disease Neg Hx     Social History Social History   Tobacco Use  . Smoking status: Current Every Day Smoker    Packs/day: 0.25    Types: Cigarettes  . Smokeless tobacco: Never Used  Substance Use  Topics  . Alcohol use: No  . Drug use: Yes    Types: Marijuana    Comment: last smoked - 01/23/2016- helps with her cramps- smokes weekly     Allergies   Amoxicillin and Penicillins   Review of Systems Review of Systems  Constitutional: Negative for chills and fever.  HENT: Positive for ear pain. Negative for congestion, rhinorrhea, sore throat and trouble swallowing.   Eyes: Negative for pain and visual disturbance.  Respiratory: Negative for cough and shortness of breath.   Cardiovascular: Negative for chest pain and palpitations.  Gastrointestinal: Negative for abdominal pain, diarrhea, nausea and vomiting.  Genitourinary: Negative for dysuria and hematuria.  Musculoskeletal: Negative for arthralgias and back pain.  Skin: Negative for color change and rash.  Neurological: Negative for seizures and syncope.  All other systems reviewed and are negative.    Physical Exam Triage Vital Signs ED Triage Vitals  Enc Vitals Group     BP      Pulse      Resp      Temp      Temp src      SpO2      Weight      Height      Head Circumference      Peak Flow      Pain Score      Pain Loc      Pain Edu?      Excl. in [place]?    No data found.  Updated Vital Signs BP 123/80 (BP Location: Left Arm)   Pulse 98   Temp 98.7 F (37.1 C) (Oral)   Resp 18   Ht _0  (1.626 m)   Wt 140 lb (63.5 kg)   LMP 02/26/2020 (Exact Date)   SpO2 97%   BMI 24.03 kg/m   Visual Acuity Right Eye Distance:   Left Eye Distance:   Bilateral Distance:    Right Eye Near:   Left Eye Near:    Bilateral Near:     Physical Exam Vitals and nursing note reviewed.  Constitutional:      General: She is not in acute distress.    Appearance: She is well-developed.  HENT:     Head: Normocephalic and atraumatic.     Right Ear: Tympanic membrane normal.     Left Ear: Tympanic membrane normal.     Nose: Nose normal.     Mouth/Throat:     Mouth: Mucous membranes are moist.  Pharynx:  Oropharynx is clear.  Eyes:     Conjunctiva/sclera: Conjunctivae normal.  Neck:     Comments: Shotty cervical lymphadenopathy. Cardiovascular:     Rate and Rhythm: Normal rate and regular rhythm.     Heart sounds: No murmur.  Pulmonary:     Effort: Pulmonary effort is normal. No respiratory distress.     Breath sounds: Normal breath sounds.  Abdominal:     General: Bowel sounds are normal.     Palpations: Abdomen is soft.     Tenderness: There is no abdominal tenderness. There is no guarding or rebound.  Musculoskeletal:     Cervical back: Neck supple.  Lymphadenopathy:     Cervical: Cervical adenopathy present.  Skin:    General: Skin is warm and dry.     Findings: No rash.  Neurological:     General: No focal deficit present.     Mental Status: She is alert and oriented to person, place, and time.  Psychiatric:        Mood and Affect: Mood normal.        Behavior: Behavior normal.      UC Treatments / Results  Labs (all labs ordered are listed, but only abnormal results are displayed) Labs Reviewed - No data to display  EKG   Radiology CT Angio Chest W/Cm &/Or Wo Cm  Result Date: 02/26/2020 CLINICAL DATA:  Chest pain and shortness of breath. Cough. EXAM: CT ANGIOGRAPHY CHEST WITH CONTRAST TECHNIQUE: Multidetector CT imaging of the chest was performed using the standard protocol during bolus administration of intravenous contrast. Multiplanar CT image reconstructions and MIPs were obtained to evaluate the vascular anatomy. CONTRAST:  61m OMNIPAQUE IOHEXOL 350 MG/ML SOLN COMPARISON:  Chest x-ray dated 02/24/2020 FINDINGS: Cardiovascular: Satisfactory opacification of the pulmonary arteries to the segmental level. No evidence of pulmonary embolism. Normal heart size. No pericardial effusion. 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 Mediastinum/Nodes: No enlarged mediastinal, hilar, or axillary lymph nodes. Thyroid gland and trachea appear normal.  Minimal hiatal hernia. Lungs/Pleura: Lungs are clear. No pleural effusion or pneumothorax. Upper Abdomen: Normal. Musculoskeletal: Normal. Review of the MIP images confirms the above findings. IMPRESSION: 1. No pulmonary emboli or other acute abnormalities. 2. Minimal hiatal hernia. 3. 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 Electronically Signed   By: JLorriane ShireM.D.   On: 02/26/2020 12:43    Procedures Procedures (including critical care time)  Medications Ordered in UC Medications - No data to display  Initial Impression / Assessment and Plan / UC Course  I have reviewed the triage vital signs and the nursing notes.  Pertinent labs & imaging results that were available during my care of the patient were reviewed by me and considered in my medical decision making (see chart for details).   Cervical lymphadenopathy.  Patient is well-appearing, in NAD.  Instructed her to follow-up with her PCP in 1 week if her lymph nodes are not improving.  Patient agrees to plan of care.      Final Clinical Impressions(s) / UC Diagnoses   Final diagnoses:  Cervical lymphadenopathy     Discharge Instructions     Follow up with your primary care provider in 1 week if your lymph nodes are not improving.      ED Prescriptions    None     PDMP not reviewed this encounter.   TSharion Balloon NP 02/27/20 1410

## 2020-02-27 NOTE — Telephone Encounter (Signed)
I saw your message in regards to patients lab work but not CT results. Please advise. KW

## 2020-02-27 NOTE — Telephone Encounter (Signed)
Isabel [name] from hospital lab called and Covid IGM was added.

## 2020-02-27 NOTE — ED Triage Notes (Signed)
Patient c/o right side lymph node swelling and burning that started yesterday. Patient states she was told to stop taking the Doxycycline and Pepcid by her PCP and was told by her PCP that she would call in a proton pump inhibitor to the pharmacy but patient states this has not been done yet. She went to [place] and was advised to follow up with her PCP in 1 week. She states she came to [place] because [place] did not have imaging.

## 2020-02-27 NOTE — Discharge Instructions (Signed)
Follow up with your primary care provider in 1 week if your lymph nodes are not improving.

## 2020-02-27 NOTE — Telephone Encounter (Signed)
[facility] and they stated there was an error and IGG was drawn instead of IGM, they state that they will call office back if it can be added. KW

## 2020-02-27 NOTE — Discharge Instructions (Addendum)
It was very nice seeing you today in clinic. Thank you for entrusting me with your care.   Start new medication. Read attached material.   Make arrangements to follow up with your regular doctor in 1 week for re-evaluation if not improving. If your symptoms/condition worsens, please seek follow up care either here or in the ER. Please remember, our [place] providers are "right here with you" when you need [place].   Again, it was my pleasure to take care of you today. Thank you for choosing our clinic. I hope that you start to feel better quickly.   Honor Loh, MSN, APRN, FNP-C, CEN Advanced Practice Provider [facility]

## 2020-02-27 NOTE — Telephone Encounter (Signed)
-----   Message from Doreen Beam, [place] sent at 02/27/2020  9:21 AM EDT -----  [facility] resulted IGG, I ordered IGM can you follow up and have them run IGM  ------------------------------------------------------------------ SARS-CoV-2 Antibody, IgM (Order [id number]) Lab Date: 02/26/2020 Department: [place] Ordering/Authorizing: Claudina Lick Kelby Aline, FNP

## 2020-02-27 NOTE — ED Triage Notes (Addendum)
Patient complains of right neck pain/swelling and describes as burning. Patient seen for this in ED on 02/24/2020 and given Doxycycline. Patient reported she was told by her family FNP to throw away doxycycline since this may have caused the hiatal hernia noted on CT from yesterday. Patient states the neck swelling has continued. Patient concerned about Hiatal Hernia as well as an artery issue she noticed on CT report.

## 2020-02-27 NOTE — Telephone Encounter (Signed)
Thank you I also routed a note to [facility] lab as well explaining that the ordered test IGM  was not drawn and they should have sufficient blood to add on test correctly IGM. IGG was not ordered.

## 2020-02-27 NOTE — Telephone Encounter (Signed)
Thanks

## 2020-02-27 NOTE — Telephone Encounter (Signed)
I think this is an old message and patient received my note in My chart regarding CT.  No acute findings of pulmonary embolism. 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  \ FYI she since messaged me and has been advised to go to the [facility] due to " neck swollen " she can not be seen in office due to Covid office protocol.

## 2020-02-28 LAB — MISC LABCORP TEST (SEND OUT): Labcorp test code: 164034

## 2020-02-28 NOTE — Telephone Encounter (Signed)
Patient has already been advised, see previous result encounter. KW

## 2020-02-28 NOTE — ED Provider Notes (Signed)
[place], [place]   Name: Isabel [name] DOB: 26-Jun-1991 MRN: [id number] CSN: [id number] PCP: Doreen Beam, FNP  Arrival date and time:  02/27/20 1512  Chief Complaint:  Lymphadenopathy  NOTE: Prior to seeing the patient today, I have reviewed the triage nursing documentation and vital signs. Clinical staff has updated patient's PMH/PSHx, current medication list, and drug allergies/intolerances to ensure comprehensive history available to assist in medical decision making.   History:   HPI: Isabel KLOEPPEL is a 29 y.o. female who presents today with multiple medical complaints. Patient reports pain in and around her RIGHT ear. Patient describes the pain as a burning sensation. Pain extends down into the RIGHT side of her neck where she is able to feel "a swollen lymph node". Patient has been seen several times (ER, PCP, UC) for the same. Notes reviewed and visits summarized as follows:   02/12/2020 - patient was seen by UC in [facility] and was prescribed systemic steroids (prednisone) for allergic rhinitis.    02/20/2020 - patient was seen in the emergency department for an exacerbation of her symptoms. She declined SARS-CoV-2 testing. Patient treated for ABR using azithromycin, albuterol, and benzonatate.    02/24/2020 - patient seen a second time in the emergency department. She complained of continued URI symptoms and epigastric burning. WBC count elevated at 13.6. CXR negative. Patient with suspect viral URI. Epigastric pain felt to be secondary to steroid induced gastritis. Antibiotic changed to doxycycline. She was advised to take famotidine to help with her gastritis symptoms.    02/26/2020 - patient participated in a tele-health visit with his PCP. Labs were ordered and patient was sent for a CT angio of her chest. Labs unremarkable expect for her FLP. CT showed no evidence of pulmonary embolism, however there was evidence of an aberrant RIGHT subclavian artery and a  small hiatal hernia.    02/27/2020 - patient was seen again by the urgent care in [place]. She complained of the swollen lymph node and epigastric pain. She was advised that appropriate course of treatment had been carried out. She was directed to follow up with her PCP in 1 week.   Patient presents today very anxious. She is tearful. She reports that she has a 58 year old child at home and her recent illness has her worried. She advises that she has taken so much medication, however is not feeling better. She is unsure why she was on some of the medications. Patient notes that she is unsure what the findings on her CT scan meant. She continues to have the epigastric pain. Patient states, "my PCP told me that she was going to call me in a medication for my stomach, but she didn't. I took the famotidine again because I was hurting". Patient also complains of numbness and tingling in her RIGHT upper and lower extremities. In further reviewed of this symptom, patient advises that she stopped taking her gabapentin (200 mg TID) a week ago citing that she was unsure if she could take it with the prescribed antibiotics that she was on. Patient presents today for further evaluation.   Past Medical History:  Diagnosis Date  . Aberrant right subclavian artery   . Dysmenorrhea   . Family history of breast cancer in mother   . Genetic testing 04/25/18   PALB2 analysis @ [facility] - Familial pathogenic PALB2 mutation detected  . Migraines   . 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 CHOLECYSTECTOMY      Family History  Problem Relation Age of Onset  . Breast cancer Mother 8       currently 22  . Hypertension Father   . Arthritis Other   . Diabetes Other   . Cancer Other   . Diabetes Maternal Grandmother   . Diabetes Maternal Grandfather   . Diabetes Paternal Grandmother   .  Diabetes Paternal Grandfather   . Breast cancer Maternal Aunt 87       currently 45; PALB2 mutation  . Colon cancer Neg Hx   . Heart disease Neg Hx     Social History   Tobacco Use  . Smoking status: Current Every Day Smoker    Packs/day: 0.25    Types: Cigarettes  . Smokeless tobacco: Never Used  Substance Use Topics  . Alcohol use: No  . Drug use: Yes    Types: Marijuana    Comment: last smoked - 01/23/2016- helps with her cramps- smokes weekly    Patient Active Problem List   Diagnosis Date Noted  . Sensation of chest pressure 02/26/2020  . Cough 02/26/2020  . Shortness of breath 02/26/2020  . Genetic testing   . Monoallelic mutation of PALB2 gene   . Family history of breast cancer in mother   . 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 Positive urine drug screen 03/27/2016  . Tobacco user 02/26/2016  . Irregular periods/menstrual cycles 02/26/2016  . Neck pain 02/22/2012  . Epigastric abdominal pain 09/25/2008  . LOW BACK PAIN 08/28/2008  . VASOVAGAL SYNCOPE 08/28/2008    Home Medications:    Current Meds  Medication Sig  . levonorgestrel (MIRENA) 20 MCG/24HR IUD 1 each by Intrauterine route once.  . [DISCONTINUED] gabapentin (NEURONTIN) 100 MG capsule Take 2 capsules (200 mg total) by mouth 3 (three) times daily.    Allergies:   Amoxicillin and Penicillins  Review of Systems (ROS):  Review of systems NEGATIVE unless otherwise noted in narrative H&P section.   Vital Signs: Today's Vitals   02/27/20 1637 02/27/20 1640 02/27/20 1709  BP:  123/82   Pulse:  88   Resp:  18   Temp:  98.2 F (36.8 C)   TempSrc:  Oral   SpO2:  100%   Weight: 140 lb (63.5 kg)    Height: 5' 4" (1.626 m)    PainSc: 6   6     Physical Exam: Physical Exam  Constitutional: She is oriented to person, place, and time and well-developed, well-nourished, and in no distress.  HENT:  Head: Normocephalic and atraumatic.  Right Ear: Tympanic membrane normal.  Left Ear: Tympanic  membrane normal.  Nose: Nose normal.  Mouth/Throat: Uvula is midline, oropharynx is clear and moist and mucous membranes are normal.  Eyes: Pupils are equal, round, and reactive to light.  Cardiovascular: Normal rate, regular rhythm, normal heart sounds and intact distal pulses.  Significant venous varicosities to BLE.   Pulmonary/Chest: Effort normal and breath sounds normal.  Abdominal: Soft. Normal appearance and bowel sounds are normal. She exhibits no distension. There is abdominal tenderness in the epigastric area.  Musculoskeletal:     Cervical back: Normal range of motion and neck supple.  Lymphadenopathy:    She has cervical adenopathy (shotty cervical on the RIGHT).  Neurological: She is alert and oriented to person, place, and time. She has normal sensation, normal strength and normal reflexes. Gait normal.  Skin: Skin is warm  and dry. No rash noted. She is not diaphoretic.  Psychiatric: Memory, affect and judgment normal. Her mood appears anxious.  Tearful and very anxious.   Nursing note and vitals reviewed.   Urgent Care Treatments / Results:   No orders of the defined types were placed in this encounter.   LABS: PLEASE NOTE: all labs that were ordered this encounter are listed, however only abnormal results are displayed.  EKG: -None  RADIOLOGY: No results found.  PROCEDURES: Procedures  MEDICATIONS RECEIVED THIS VISIT: Medications - No data to display  PERTINENT CLINICAL COURSE NOTES/UPDATES:   Initial Impression / Assessment and Plan / Urgent Care Course:  Pertinent labs & imaging results that were available during my care of the patient were personally reviewed by me and considered in my medical decision making (see lab/imaging section of note for values and interpretations).  SHAINE MOUNT is a 29 y.o. female who presents to [facility] today with complaints of Lymphadenopathy  Patient is well appearing overall in clinic today. She does not  appear to be in any acute distress. Presenting symptoms (see HPI) and exam as documented above. Patient presents to clinic very anxious. She has multiple issues. I have spoken with the [facility] UC staff, as well as here PCP (Flinchum, FNP-C), today to ensure that I have a clear picture of what has transpired and the plans going forward for this patient. Will proceed as follows:   Lynphadenopathy - discussed that this is likely reactive in response to her recent infections. Reassurance provided. Patient advised that the nodes were small, soft, and freely moveable, thus not representing a major concern at this point. Dicussed with patient that she should not become concerned until about a 4 week timeframe. If the LAD persists beyond that point, patient will need to be referred to ENT for consideration of a biopsy. Patient verbalized understanding.    Aberrant subclavian artery - dicussed CT finding with patient and briefly reviewed the significance. In speaking with her PCP, this has also been discussed with cardiology and noted in her chart in the event that patient has to have any type of thoracic surgery in the future. I personally added this finding to patient's PMH list.    Epigastric pain - patient with known hiatal hernia seen on CT. She is recovering from recent steroid induced gastritis. Patient has been taking famotidine with minimal effectiveness. She is concerned because her PCP has not started her on PPI. Patient states, "she told me she was going to, but hasn't yet". Explained to patient that this was likely and oversight and her provider had been in clinic today. I advised her that I would be more than willing to start the medication. Patient agreeable. Will start patient on pantoprazole 40 mg daily. If note improving in 2-3 weeks, patient may benefit from seeing GI to be considered for EGD to directly assess her hernia and gastric lining (gastritis vs. PUD). Written information provided on  today's AVS regarding GERD and food choices that can help prevent reflux symptoms.    Anxiety about health - patient very anxious today. I feel as if her visit today did a great deal to clam her nerves. She advises that she remains anxious. Discussed starting her on something mild for her anxiety (hydroxyzine), however patient wishes to defer at this time citing that she doesn't want to take any more medication. Her marijuana use could also be contributory. I advised her to follow up with PCP to discuss further.  Discussed follow up with primary care physician in 1 week for re-evaluation, or sooner if needed. I have reviewed the follow up and strict return precautions for any new or worsening symptoms. Patient is aware of symptoms that would be deemed urgent/emergent, and would thus require further evaluation either here or in the emergency department. At the time of discharge, she verbalized understanding and consent with the discharge plan as it was reviewed with her. All questions were fielded by provider and/or clinic staff prior to patient discharge.    Final Clinical Impressions / Urgent Care Diagnoses:   Final diagnoses:  LAD (lymphadenopathy), cervical  Hiatal hernia  Anxiety about health  Marijuana user    New Prescriptions:  [place] Controlled Substance Registry consulted? Not Applicable  Meds ordered this encounter  Medications  . pantoprazole (PROTONIX) 40 MG tablet    Sig: Take 1 tablet (40 mg total) by mouth daily before breakfast.    Dispense:  30 tablet    Refill:  2    Recommended Follow up Care:  Patient encouraged to follow up with the following provider within the specified time frame, or sooner as dictated by the severity of her symptoms. As always, she was instructed that for any urgent/emergent care needs, she should seek care either here or in the emergency department for more immediate evaluation.  Follow-up Information    Flinchum, Kelby Aline, FNP In 1 week.     Specialty: Family Medicine Why: General reassessment of symptoms if not improving Contact information: [address] [place] [address] [phone]         NOTE: This note was prepared using Dragon dictation software along with smaller Company secretary. Despite my best ability to proofread, there is the potential that transcriptional errors may still occur from this process, and are completely unintentional.    Karen Kitchens, NP 02/29/20 1934

## 2020-03-01 ENCOUNTER — Other Ambulatory Visit: Payer: Self-pay | Admitting: Adult Health

## 2020-03-01 DIAGNOSIS — K449 Diaphragmatic hernia without obstruction or gangrene: Secondary | ICD-10-CM

## 2020-03-01 DIAGNOSIS — R1013 Epigastric pain: Secondary | ICD-10-CM

## 2020-03-03 ENCOUNTER — Other Ambulatory Visit: Payer: Self-pay | Admitting: Obstetrics and Gynecology

## 2020-03-06 ENCOUNTER — Encounter: Payer: Self-pay | Admitting: Gastroenterology

## 2020-03-06 ENCOUNTER — Telehealth (INDEPENDENT_AMBULATORY_CARE_PROVIDER_SITE_OTHER): Payer: Self-pay | Admitting: Gastroenterology

## 2020-03-06 ENCOUNTER — Other Ambulatory Visit: Payer: Self-pay

## 2020-03-06 DIAGNOSIS — K219 Gastro-esophageal reflux disease without esophagitis: Secondary | ICD-10-CM

## 2020-03-06 NOTE — Progress Notes (Addendum)
Isabel [name] [address]  [place], [place] [address]  Main: [phone]  Fax: [phone]   Gastroenterology Consultation  Referring Provider:     Sharmon Leyden* Primary Care Physician:  Doreen Beam, FNP Reason for Consultation:     Abdominal pain        HPI:   Virtual Visit via Video Note  I connected with patient on 03/06/20 at  2:30 PM EDT by video ([email or web address]) and verified that I am speaking with the correct person using two identifiers.   I discussed the limitations, risks, security and privacy concerns of performing an evaluation and management service by video and the availability of in person appointments. I also discussed with the patient that there may be a patient responsible charge related to this service. The patient expressed understanding and agreed to proceed.  Location of the patient: Home Location of provider: Home Participating persons: Patient and provider only (Nursing staff checked in patient via phone but were not physically involved in the video interaction - see their notes)   History of Present Illness: Chief Complaint  Patient presents with  . New Patient (Initial Visit)  . Abdominal Pain    Patient stated that she had been having tenderness and soreness on upper abd, burning in her esophagus and chest  . Hiatal Hernia    Isabel [name] is a 29 y.o. y/o female referred for consultation & management  by Dr. Claudina Lick, Kelby Aline, FNP.  Patient reports 2-week history of epigastric pain and burning sensation in chest.  She recently had multiple ER/urgent care visits with URI symptoms,/neck lymphadenopathy and has been treated with antibiotics.  She started noticing the abdominal symptoms after taking several antibiotics.  No nausea or vomiting, altered bowel habits or blood in stool.  No weight loss.  No dysphagia.  CT scan showed "minimal hiatal hernia".  She was started on Protonix 40 mg once daily on March 06, 2020 and she states that has made a great difference in her symptoms are much better.  She was having burning sensation in chest which has resolved.  The epigastric pain is still present, and she states it is reproducible when she presses on it, and when she bends down.  No family history of colon cancer.  Past Medical History:  Diagnosis Date  . Aberrant right subclavian artery   . Dysmenorrhea   . Family history of breast cancer in mother   . Genetic testing 04/25/18   PALB2 analysis @ [facility] - Familial pathogenic PALB2 mutation detected  . Migraines   . 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 CHOLECYSTECTOMY      Prior to Admission medications   Medication Sig Start Date End Date Taking? Authorizing Provider  gabapentin (NEURONTIN) 100 MG capsule Take 100 mg by mouth 3 (three) times daily.   Yes [provider]  levonorgestrel (MIRENA) 20 MCG/24HR IUD 1 each by Intrauterine route once.   Yes [provider]  pantoprazole (PROTONIX) 40 MG tablet Take 1 tablet (40 mg total) by mouth daily before breakfast. 02/27/20  Yes Karen Kitchens, NP  albuterol (VENTOLIN HFA) 108 (90 Base) MCG/ACT inhaler Inhale 2 puffs into the lungs every 4 (four) hours as needed for wheezing or shortness of breath. Patient not taking: Reported on 02/26/2020 02/20/20 02/27/20  Sharion Balloon, NP  famotidine (PEPCID) 20  MG tablet Take 1 tablet (20 mg total) by mouth 2 (two) times daily. 02/24/20 02/27/20  Lilia Pro., MD    Family History  Problem Relation Age of Onset  . Breast cancer Mother 32       currently 16  . Hypertension Father   . Arthritis Other   . Diabetes Other   . Cancer Other   . Diabetes Maternal Grandmother   . Diabetes Maternal Grandfather   . Diabetes Paternal Grandmother   . Diabetes Paternal Grandfather   . Breast cancer Maternal Aunt 60        currently 83; PALB2 mutation  . Colon cancer Neg Hx   . Heart disease Neg Hx      Social History   Tobacco Use  . Smoking status: Current Every Day Smoker    Packs/day: 0.25    Types: Cigarettes  . Smokeless tobacco: Never Used  Substance Use Topics  . Alcohol use: No  . Drug use: Yes    Types: Marijuana    Comment: last smoked - 01/23/2016- helps with her cramps- smokes weekly    Allergies as of 03/06/2020 - Review Complete 03/06/2020  Allergen Reaction Noted  . Amoxicillin Hives   . Penicillins Hives     Review of Systems:    All systems reviewed and negative except where noted in HPI.   Observations/Objective:  Labs: CBC    Component Value Date/Time   WBC 10.2 02/26/2020 1333   RBC 4.82 02/26/2020 1333   HGB 15.5 (H) 02/26/2020 1333   HGB 14.8 07/19/2019 1104   HCT 43.5 02/26/2020 1333   HCT 43.6 07/19/2019 1104   PLT 231 02/26/2020 1333   PLT 228 07/19/2019 1104   MCV 90.2 02/26/2020 1333   MCV 94 07/19/2019 1104   MCV 93 12/02/2014 1042   MCH 32.2 02/26/2020 1333   MCHC 35.6 02/26/2020 1333   RDW 12.7 02/26/2020 1333   RDW 12.7 07/19/2019 1104   RDW 13.1 12/02/2014 1042   LYMPHSABS 1.6 02/26/2020 1333   LYMPHSABS 2.9 03/19/2016 1516   LYMPHSABS 2.3 12/02/2014 1042   MONOABS 0.6 02/26/2020 1333   MONOABS 0.6 12/02/2014 1042   EOSABS 0.1 02/26/2020 1333   EOSABS 0.4 03/19/2016 1516   EOSABS 0.3 12/02/2014 1042   BASOSABS 0.0 02/26/2020 1333   BASOSABS 0.0 03/19/2016 1516   BASOSABS 0.0 12/02/2014 1042   CMP     Component Value Date/Time   NA 138 02/26/2020 1333   NA 141 07/19/2019 1104   NA 139 12/02/2014 1042   K 4.4 02/26/2020 1333   K 4.1 12/02/2014 1042   CL 104 02/26/2020 1333   CL 106 12/02/2014 1042   CO2 22 02/26/2020 1333   CO2 27 12/02/2014 1042   GLUCOSE 92 02/26/2020 1333   GLUCOSE 109 (H) 12/02/2014 1042   BUN 13 02/26/2020 1333   BUN 9 07/19/2019 1104   BUN 7 12/02/2014 1042   CREATININE 0.80 02/26/2020 1333   CREATININE  0.89 12/02/2014 1042   CALCIUM 9.3 02/26/2020 1333   CALCIUM 8.8 12/02/2014 1042   PROT 7.3 02/26/2020 1333   PROT 6.2 07/19/2019 1104   PROT 7.3 12/02/2014 1042   ALBUMIN 4.2 02/26/2020 1333   ALBUMIN 4.3 07/19/2019 1104   ALBUMIN 3.6 12/02/2014 1042   AST 14 (L) 02/26/2020 1333   AST 13 (L) 12/02/2014 1042   ALT 13 02/26/2020 1333   ALT 18 12/02/2014 1042   ALKPHOS 48 02/26/2020 1333   ALKPHOS 75  12/02/2014 1042   BILITOT 1.0 02/26/2020 1333   BILITOT 0.3 07/19/2019 1104   BILITOT 0.5 12/02/2014 1042   GFRNONAA >60 02/26/2020 1333   GFRNONAA >60 12/02/2014 1042   GFRNONAA >60 07/18/2014 0756   GFRAA >60 02/26/2020 1333   GFRAA >60 12/02/2014 1042   GFRAA >60 07/18/2014 0756    Imaging Studies: DG Chest 2 View  Result Date: 02/24/2020 CLINICAL DATA:  Chest pain, right neck pain described as burning, diagnosed with bronchitis on Wednesday, started on Z-Pak EXAM: CHEST - 2 VIEW COMPARISON:  Radiograph 07/23/2014 FINDINGS: No consolidation or convincing features of edema. Slight blunting of the costophrenic sulcus on the right is favored to reflect inspiratory effort. No visible pneumothorax or effusion. Pulmonary vascularity is normally distributed. The cardiomediastinal contours are unremarkable. No acute osseous or soft tissue abnormality. Bilateral metallic nipple ornamentation. IMPRESSION: No acute cardiopulmonary abnormality. Electronically Signed   By: Lovena Le M.D.   On: 02/24/2020 02:00   CT Angio Chest W/Cm &/Or Wo Cm  Result Date: 02/26/2020 CLINICAL DATA:  Chest pain and shortness of breath. Cough. EXAM: CT ANGIOGRAPHY CHEST WITH CONTRAST TECHNIQUE: Multidetector CT imaging of the chest was performed using the standard protocol during bolus administration of intravenous contrast. Multiplanar CT image reconstructions and MIPs were obtained to evaluate the vascular anatomy. CONTRAST:  55m OMNIPAQUE IOHEXOL 350 MG/ML SOLN COMPARISON:  Chest x-ray dated 02/24/2020  FINDINGS: Cardiovascular: Satisfactory opacification of the pulmonary arteries to the segmental level. No evidence of pulmonary embolism. Normal heart size. No pericardial effusion. 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 Mediastinum/Nodes: No enlarged mediastinal, hilar, or axillary lymph nodes. Thyroid gland and trachea appear normal. Minimal hiatal hernia. Lungs/Pleura: Lungs are clear. No pleural effusion or pneumothorax. Upper Abdomen: Normal. Musculoskeletal: Normal. Review of the MIP images confirms the above findings. IMPRESSION: 1. No pulmonary emboli or other acute abnormalities. 2. Minimal hiatal hernia. 3. 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 Electronically Signed   By: JLorriane ShireM.D.   On: 02/26/2020 12:43    Assessment and Plan:   CECHO PROPPis a 29y.o. y/o female has been referred for abdominal pain  Assessment and Plan: Her symptoms are much improved on PPI once daily  She likely had acid reflux that is now much improved with PPI  Her epigastric pain is reproducible as per her report (we discussed the limitation of this visit being a video visit is unable to verify this with physical exam), and is likely musculoskeletal in nature.  Tylenol or warm compresses recommended for this.  Burning sensation in chest is significantly improved.  Patient educated extensively on acid reflux lifestyle modification, including buying a bed wedge, not eating 3 hrs before bedtime, diet modifications, and handout given for the same.   Since symptoms are improving, I have discussed continuing the medication for a total of 4 weeks and if symptoms do not resolve, consider an upper endoscopy at this time.  Alternative with proceeding with upper endoscopy now was also discussed given her multiple ER visits.  Given improvement in symptoms, patient is comfortable waiting to see how symptoms change after a month of PPI  (Risks of PPI use were  discussed with patient including bone loss, C. Diff diarrhea, pneumonia, infections, CKD, electrolyte abnormalities.  Pt. Verbalizes understanding and chooses to continue the medication.)  CT and labs reassuring  Avoid NSAID use such as Ibuprofen, Aleeve, advil, motrin, BC and Goodie powder, Naproxen, Meloxicam and  others.    Follow Up Instructions:   I discussed the assessment and treatment plan with the patient. The patient was provided an opportunity to ask questions and all were answered. The patient agreed with the plan and demonstrated an understanding of the instructions.   The patient was advised to call back or seek an in-person evaluation if the symptoms worsen or if the condition fails to improve as anticipated.  I provided 15 minutes of face-to-face time via video software during this encounter.  Additional time was spent in reviewing patient's chart, placing orders etc.   Virgel Manifold, MD  Speech recognition software was used to dictate the above note.

## 2020-03-06 NOTE — Patient Instructions (Signed)

## 2020-03-14 ENCOUNTER — Ambulatory Visit (INDEPENDENT_AMBULATORY_CARE_PROVIDER_SITE_OTHER): Payer: Self-pay | Admitting: Adult Health

## 2020-03-14 ENCOUNTER — Other Ambulatory Visit: Payer: Self-pay

## 2020-03-14 ENCOUNTER — Other Ambulatory Visit: Payer: Self-pay | Admitting: Adult Health

## 2020-03-14 ENCOUNTER — Ambulatory Visit
Admission: RE | Admit: 2020-03-14 | Discharge: 2020-03-14 | Disposition: A | Discharge status: Home / Self Care | Payer: Self-pay | Source: Ambulatory Visit | Attending: Adult Health | Admitting: Adult Health

## 2020-03-14 ENCOUNTER — Encounter: Payer: Self-pay | Admitting: Adult Health

## 2020-03-14 VITALS — BP 126/80 | HR 92 | Temp 96.2°F | Resp 16 | Ht 64.0 in | Wt 152.0 lb

## 2020-03-14 DIAGNOSIS — Q278 Other specified congenital malformations of peripheral vascular system: Secondary | ICD-10-CM

## 2020-03-14 DIAGNOSIS — R1033 Periumbilical pain: Secondary | ICD-10-CM

## 2020-03-14 DIAGNOSIS — N8311 Corpus luteum cyst of right ovary: Secondary | ICD-10-CM | POA: Insufficient documentation

## 2020-03-14 DIAGNOSIS — K429 Umbilical hernia without obstruction or gangrene: Secondary | ICD-10-CM

## 2020-03-14 DIAGNOSIS — R1011 Right upper quadrant pain: Secondary | ICD-10-CM | POA: Insufficient documentation

## 2020-03-14 DIAGNOSIS — R1031 Right lower quadrant pain: Secondary | ICD-10-CM

## 2020-03-14 DIAGNOSIS — F419 Anxiety disorder, unspecified: Secondary | ICD-10-CM | POA: Insufficient documentation

## 2020-03-14 DIAGNOSIS — Z9049 Acquired absence of other specified parts of digestive tract: Secondary | ICD-10-CM | POA: Insufficient documentation

## 2020-03-14 LAB — POCT URINE PREGNANCY: Preg Test, Ur: NEGATIVE

## 2020-03-14 IMAGING — CT CT ABD-PELV W/ CM
2 of 4 series · 15 of 46 positions shown, 17 images · IV contrast (APPLIED)
Comparison: CT of the abdomen and pelvis on [DATE]

CLINICAL DATA: Abdominal pain, unspecified RIGHT UPPER QUADRANT and
RIGHT LOWER QUADRANT pain. Small RIGHT-sided umbilical hernia. Rule
out appendicitis. History of cholecystectomy.

EXAM:
CT ABDOMEN AND PELVIS WITH CONTRAST
TECHNIQUE: Multidetector CT imaging of the abdomen and pelvis was performed
using the standard protocol following bolus administration of
intravenous contrast.
CONTRAST:  100mL OMNIPAQUE IOHEXOL 300 MG/ML  SOLN

[Series 2: axial st · axial · 0.68mm/px · z∈[-457,-37]mm · 12 of 92 slices shown, 14 images]
[im 4/92  soft-tissue]
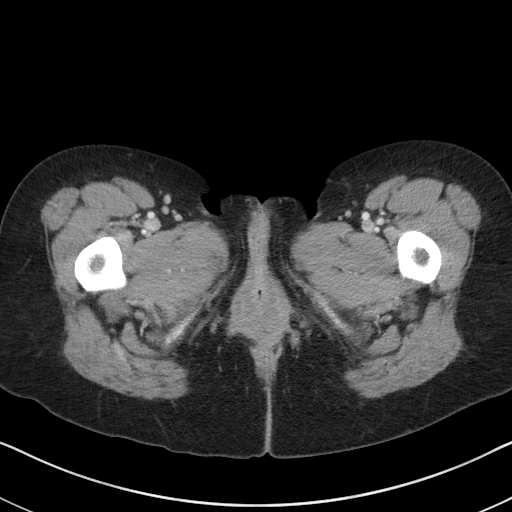
[im 4/92  bone]
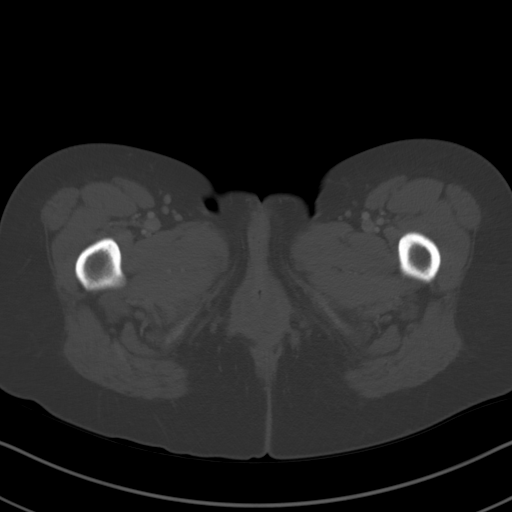
[im 12/92  soft-tissue]
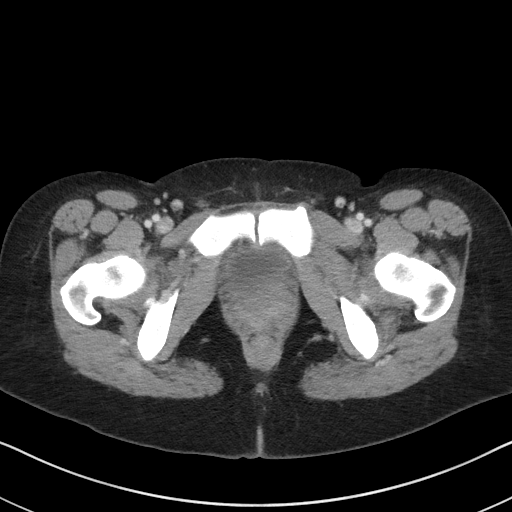
[im 19/92  soft-tissue]
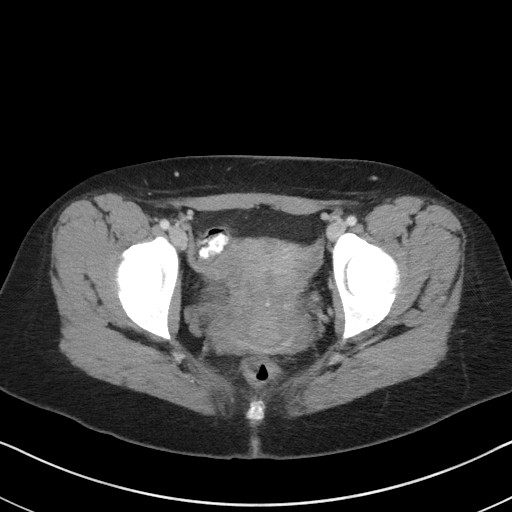
[im 27/92  soft-tissue]
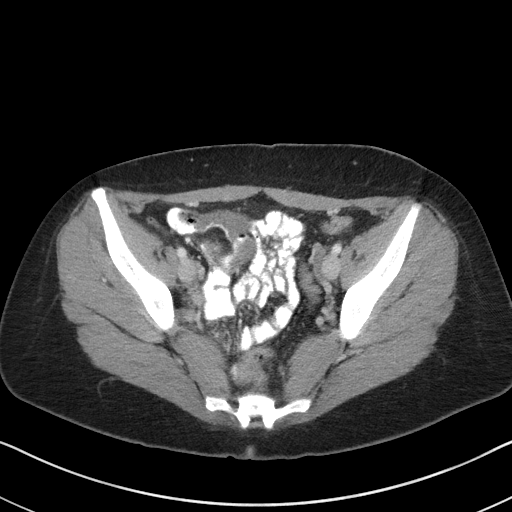
[im 35/92  soft-tissue]
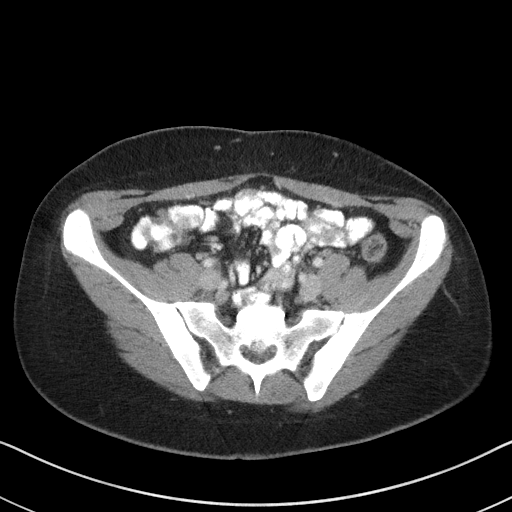
[im 42/92  soft-tissue]
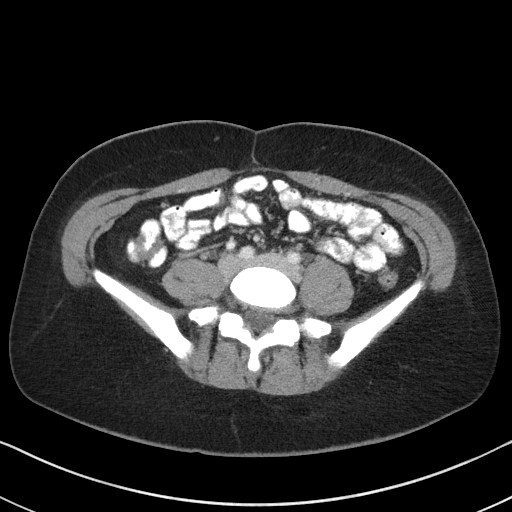
[im 50/92  soft-tissue]
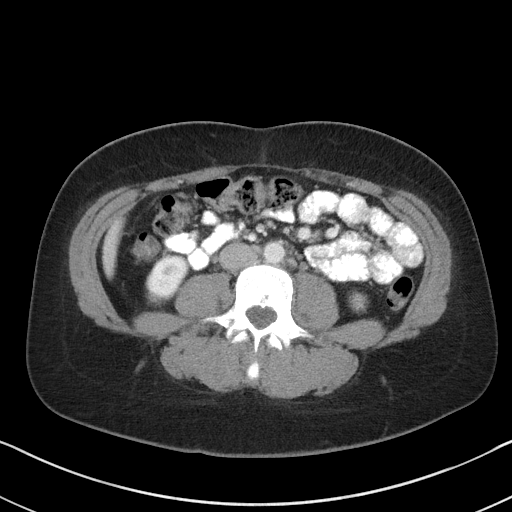
[im 57/92  soft-tissue]
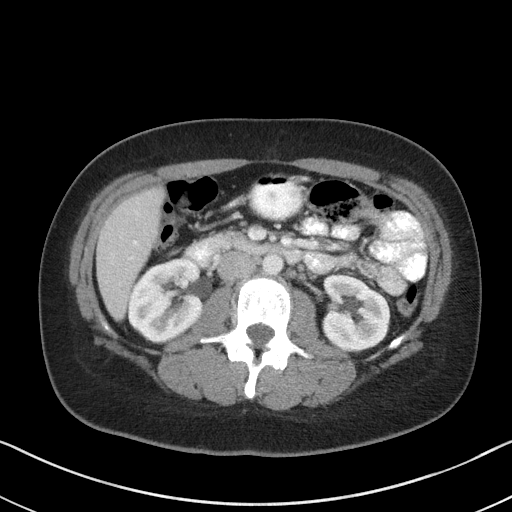
[im 65/92  soft-tissue]
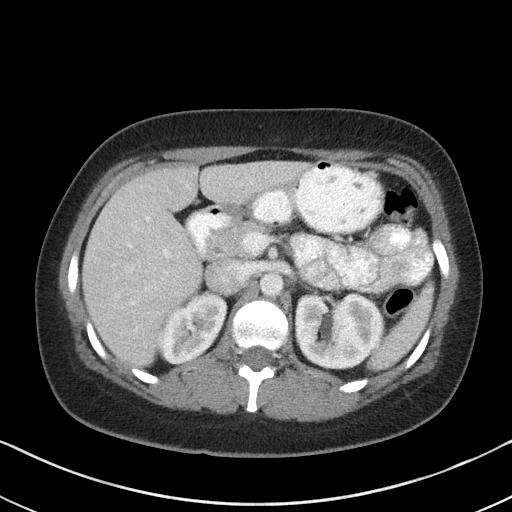
[im 65/92  bone]
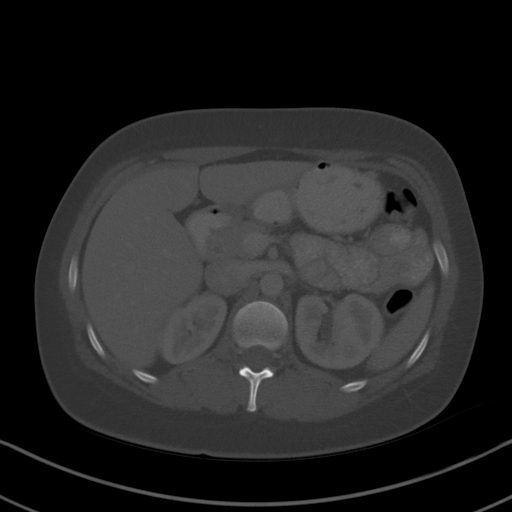
[im 73/92  soft-tissue]
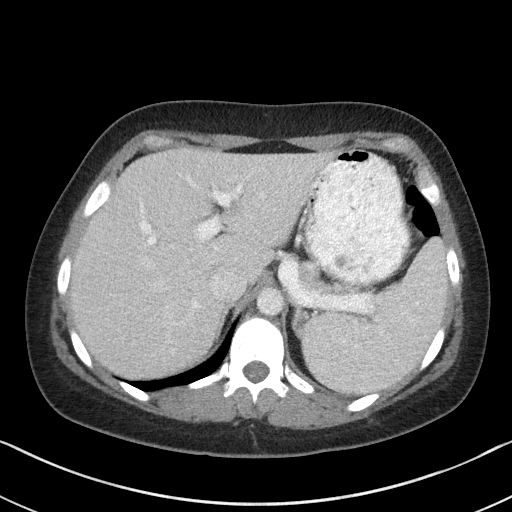
[im 80/92  soft-tissue]
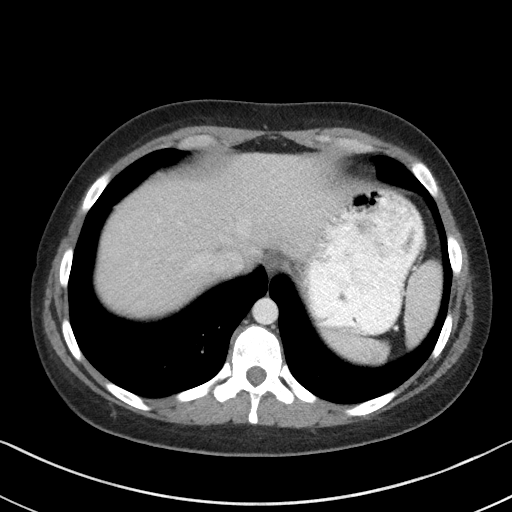
[im 88/92  soft-tissue]
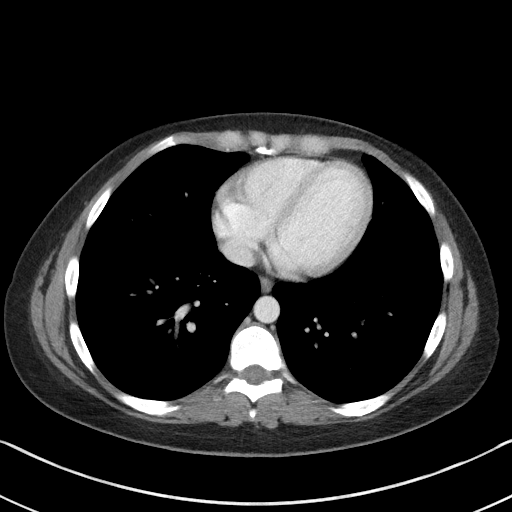

[Series 5: coronal st · coronal · 0.67mm/px · 3 of 81 slices shown]
[im 27/81  soft-tissue]
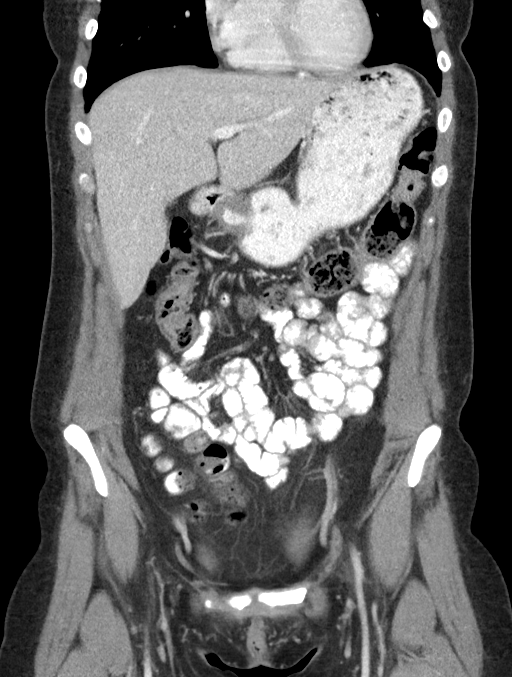
[im 36/81  soft-tissue]
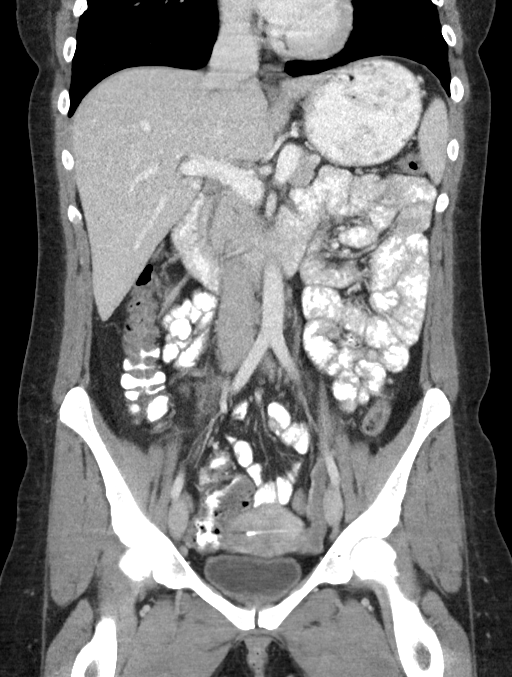
[im 45/81  soft-tissue]
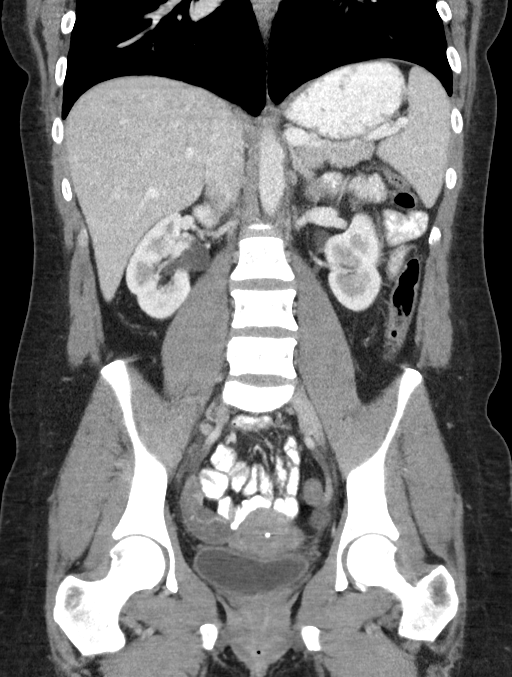

[15 of 46 positions shown; findings below may reference images not displayed]

FINDINGS: Lower chest: No acute abnormality.

Hepatobiliary: Cholecystectomy. Liver is normal in appearance.

Pancreas: Unremarkable. No pancreatic ductal dilatation or
surrounding inflammatory changes.

Spleen: Normal in size without focal abnormality.

Adrenals/Urinary Tract: Normal adrenal glands. Symmetric enhancement
of both kidneys. No renal mass. Ureters are unremarkable. The
bladder and visualized portion of the urethra are normal.

Stomach/Bowel: Stomach and small bowel loops are normal in
appearance. The appendix is well seen and has a normal appearance.
Colon is unremarkable.

Vascular/Lymphatic: There is normal vascular opacification of the
celiac axis, superior mesenteric artery, and inferior mesenteric
artery. Normal appearance of the portal venous system and inferior
vena cava. No retroperitoneal or mesenteric adenopathy.

Reproductive: Uterus is present and contains intrauterine device in
expected central portion of the uterus. Enhancing RIGHT corpus
luteum cyst is 1.8 centimeters on image 79 of series 2. LEFT ovarian
follicle is 1.7 centimeters on image 71 of series 2. Ovaries do not
appear enlarged. There is no free pelvic fluid.

Other: Small fat containing paraumbilical hernia.

Musculoskeletal: No acute or significant osseous findings.
IMPRESSION: 1. RIGHT ovarian corpus luteum cyst measuring 1.8 centimeters, a
possible source of the patient's pain. Consider follow-up pelvic
ultrasound in 6-8 weeks.
2. Normal appendix.
3. Cholecystectomy.
4. Small fat containing paraumbilical hernia.

## 2020-03-14 MED ORDER — ESCITALOPRAM OXALATE 10 MG PO TABS: 10.0000 mg | ORAL_TABLET | Freq: Every day | ORAL | 0 refills | Status: DC

## 2020-03-14 MED ORDER — IOHEXOL 300 MG/ML  SOLN
100.0000 mL | Freq: Once | INTRAMUSCULAR | Status: AC | PRN
  Administered 2020-03-14: 16:00:00 100 mL via INTRAVENOUS

## 2020-03-14 NOTE — Progress Notes (Signed)
Established patient visit   Patient: Isabel [name]   DOB: 21-May-1991   29 y.o. Female  MRN: [id number] Visit Date: 03/14/2020  Today's healthcare provider: Marcille Buffy, FNP   Clint Bolder as a scribe for [place], FNP.,have documented all relevant documentation on the behalf of [place] Lydon Vansickle, FNP,as directed by  Marcille Buffy, FNP while in the presence of Marcille Buffy, [place].  Chief Complaint  Patient presents with  . Anxiety  . Follow-up  . Gastroesophageal Reflux   Subjective    HPI Anxiety, Follow-up  She was last seen for anxiety 3 weeks ago. Changes made at last visit include none.  She feels her anxiety is severe and Unchanged since last visit. Patient reports that she has tried Wellbutrin in past and states that she did not tolerate it well with symptom control.  Symptoms: Yes chest pain No difficulty concentrating No dizziness Yes fatigue Yes feelings of losing control No insomnia No irritable No palpitations Yes panic attacks No racing thoughts No shortness of breath No sweating No tremors/shakes  She also reports to the provider that she has been having tenderness in her abdomen periumbilical to the right side, she noticed a bulging the other to the right side of her abdomen. She has been nauseous she continues to have chest pressure occasionally that is much better since starting PPI,  that she is taking the Protonix for she has a small hiatal hernia she has seen gastroenterology.  She did have an incidental finding of aberrant right subclavian artery on her CT that ruled out a pulmonary embolus. Consulted with cardiology Dr. Fletcher Anon who said she was it was likely congenital and that she should follow-up with vascular should she have any problems with swallowing or throat problems.  She should notify all providers if she has to have surgery that she has this apparent  artery.  Patient  denies any fever, body aches,chills, rash, chest pain, shortness of breath, nausea, vomiting, or diarrhea.   GAD-7 Results GAD-7 Generalized Anxiety Disorder Screening Tool 03/14/2020  1. Feeling Nervous, Anxious, or on Edge 2  2. Not Being Able to Stop or Control Worrying 3  3. Worrying Too Much About Different Things 3  4. Trouble Relaxing 3  5. Being So Restless it's Hard To Sit Still 3  6. Becoming Easily Annoyed or Irritable 0  7. Feeling Afraid As If Something Awful Might Happen 1  Total GAD-7 Score 15  Difficulty At Work, Home, or Getting  Along With Others? Somewhat difficult    PHQ-9 Scores PHQ9 SCORE ONLY 02/26/2020 02/26/2020 12/08/2016  Score 0 3 16   Follow up ER visit  Patient was seen in ER for burning sensation in her right ear and swelling of lymph node on right side of her neck on 02/27/20. She was treated for cervical lymphanemopathy.  She reports this is improved with treatment. Treatment for this included none, patient advised to follow up with PCP in 1 week if lymph nodes arent improving. She reports this condition is Improved. Patient was also seen at [facility] with complaints of chest pain , she was treated for anxiety and was started on Hydroxyzine and started on Pantoprazole 87m daily. Patient reports today that she has not started Hydroxyzine and would like to disucss.  She has tried Benadryl occasionally and this did not help she does desire to try medication for her anxiety. ----------------------------------------------------------------------------------------- GERD, Follow up:  The patient  was last seen for GERD 3 weeks ago. Changes made since that visit include none.  She reports excellent compliance with treatment. She is not having side effects. .  She IS experiencing abdominal bloating, fullness after meals and midespigastric pain. She is NOT experiencing belching, bilious reflux, choking on food, cough, deep  pressure at base of neck, difficulty swallowing, early satiety, heartburn, hematemesis, hoarseness, laryngitis, melena, nausea, need to clear throat frequently, nocturnal burning, odynophagia, regurgitation of undigested food, shortness of breath, symptoms primarily relate to meals, and lying down after meals, unexpected weight loss, upper abdominal discomfort or waterbrash Smoking cessation is advised. -----------------------------------------------------------------------------------------   Patient Active Problem List   Diagnosis Date Noted  . 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 Sensation of chest pressure 02/26/2020  . Shortness of breath 02/26/2020  . Genetic testing   . Monoallelic mutation of PALB2 gene   . Family history of breast cancer in mother   . 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 Positive urine drug screen 03/27/2016  . Tobacco user 02/26/2016  . Irregular periods/menstrual cycles 02/26/2016  . Neck pain 02/22/2012  . Epigastric abdominal pain 09/25/2008  . LOW BACK PAIN 08/28/2008  . VASOVAGAL SYNCOPE 08/28/2008   Past Medical History:  Diagnosis Date  . Aberrant right subclavian artery   . Dysmenorrhea   . Family history of breast cancer in mother   . Genetic testing 04/25/18   PALB2 analysis @ [facility] - Familial pathogenic PALB2 mutation detected  . Migraines   . 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 Smoking status: Current Every Day Smoker    Packs/day: 0.25    Types: Cigarettes  . Smokeless tobacco: Never Used  Substance Use Topics  . Alcohol use: No  . Drug use: Yes    Types: Marijuana    Comment: last smoked - 01/23/2016- helps with her cramps- smokes weekly   Family History  Problem Relation Age of Onset  . Breast cancer Mother 27       currently 53  . Hypertension Father   . Arthritis Other   . Diabetes Other   . Cancer Other   .  Diabetes Maternal Grandmother   . Diabetes Maternal Grandfather   . Diabetes Paternal Grandmother   . Diabetes Paternal Grandfather   . Breast cancer Maternal Aunt 48       currently 28; PALB2 mutation  . Colon cancer Neg Hx   . Heart disease Neg Hx    Allergies  Allergen Reactions  . Amoxicillin Hives  . Penicillins Hives       Medications: Outpatient Medications Prior to Visit  Medication Sig  . gabapentin (NEURONTIN) 100 MG capsule Take 100 mg by mouth 3 (three) times daily.  Marland Kitchen levonorgestrel (MIRENA) 20 MCG/24HR IUD 1 each by Intrauterine route once.  . pantoprazole (PROTONIX) 40 MG tablet Take 1 tablet (40 mg total) by mouth daily before breakfast.   No facility-administered medications prior to visit.    Review of Systems  HENT: Negative.   Respiratory: Negative.   Cardiovascular: Negative.        Denies any chest pain or chest pressure today.  Gastrointestinal: Positive for abdominal distention and abdominal pain. Negative for anal bleeding, blood in stool, constipation, diarrhea, nausea, rectal pain and vomiting.  Genitourinary: Negative.   Musculoskeletal: Negative.   Skin: Negative.   Neurological: Negative.   Hematological: Negative.   Psychiatric/Behavioral: Negative  for sleep disturbance and suicidal ideas. The patient is nervous/anxious.    Patient's last menstrual period was 02/26/2020 (exact date).  Last CBC Lab Results  Component Value Date   WBC 10.2 02/26/2020   HGB 15.5 (H) 02/26/2020   HCT 43.5 02/26/2020   MCV 90.2 02/26/2020   MCH 32.2 02/26/2020   RDW 12.7 02/26/2020   PLT 231 02/26/2020      Objective    BP 126/80   Pulse 92   Temp (!) 96.2 F (35.7 C) (Oral)   Resp 16   Ht '5\' 4"'  (1.626 m)   Wt 152 lb (68.9 kg)   LMP 02/26/2020 (Exact Date)   SpO2 99%   BMI 26.09 kg/m    Physical Exam Vitals reviewed. Exam conducted with a chaperone present.  Constitutional:      General: She is not in acute distress.    Appearance:  Normal appearance. She is not ill-appearing, toxic-appearing or diaphoretic.  HENT:     Head: Normocephalic and atraumatic.     Nose: No congestion or rhinorrhea.     Mouth/Throat:     Mouth: Mucous membranes are moist.     Pharynx: No oropharyngeal exudate or posterior oropharyngeal erythema.  Eyes:     Extraocular Movements: Extraocular movements intact.     Pupils: Pupils are equal, round, and reactive to light.  Cardiovascular:     Rate and Rhythm: Normal rate and regular rhythm.     Pulses: Normal pulses.     Heart sounds: Normal heart sounds.  Pulmonary:     Effort: Pulmonary effort is normal.     Breath sounds: Normal breath sounds. No wheezing, rhonchi or rales.  Chest:     Chest wall: No tenderness.  Abdominal:     General: There is no distension.     Palpations: There is no mass.     Tenderness: There is abdominal tenderness in the right upper quadrant and right lower quadrant. There is no right CVA tenderness, left CVA tenderness, guarding or rebound.     Hernia: A hernia is present. Hernia is present in the umbilical area (right side small and reducible).       Comments: Small hernia noted and tenderness  Tenderness right lower quadrant.  Negative for Grey's Turner sign.   Musculoskeletal:        General: Normal range of motion.     Cervical back: Normal range of motion and neck supple.  Skin:    General: Skin is warm and dry.     Capillary Refill: Capillary refill takes less than 2 seconds.  Neurological:     Mental Status: She is alert and oriented to person, place, and time.  Psychiatric:        Mood and Affect: Mood normal.        Behavior: Behavior normal.        Thought Content: Thought content normal.        Judgment: Judgment normal.     Results for orders placed or performed in visit on 03/14/20 (from the past 24 hour(s))  POCT urine pregnancy     Status: Normal   Collection Time: 03/14/20 11:51 AM  Result Value Ref Range   Preg Test, Ur Negative  Negative    No results found for any visits on 03/14/20.  Assessment & Plan    .Aberrant right subclavian artery- noted on CT angiogram   Anxiety - Plan: escitalopram (LEXAPRO) 10 MG tablet  RUQ pain - Plan: CT Abdomen  Pelvis Wo Contrast, POCT urine pregnancy  Periumbilical abdominal pain  History of laparoscopic cholecystectomy  Right lower quadrant abdominal pain - Plan: CT Abdomen Pelvis Wo Contrast   Return in about 2 months (around 05/14/2020) for Depression/Anxiety. Follow up on anxiety in 2 months.   Abdominal CT to rule out appendicitis. Schedule follow up with your gastrointestinal MD.   Orders Placed This Encounter  Procedures  . CT Abdomen Pelvis Wo Contrast    Standing Status:   Future    Standing Expiration Date:   06/14/2021    Order Specific Question:   ** REASON FOR EXAM (FREE TEXT)    Answer:   RUQ tenderness , RUQ peri-umbilical tenderness more on the right side R/O appendicitis, gallblader has been removed.    Order Specific Question:   Is patient pregnant?    Answer:   No    Order Specific Question:   Preferred imaging location?    Answer:   [facility]    Order Specific Question:   Is Oral Contrast requested for this exam?    Answer:   Yes, Per Radiology protocol    Order Specific Question:   Call Results- Best Contact Number?    Answer:   [phone]    Order Specific Question:   Radiology Contrast Protocol - do NOT remove file path    Answer:   [email or web address]  . CBC with Differential/Platelet  . Comprehensive Metabolic Panel (CMET)  . POCT urine pregnancy   Meds ordered this encounter  Medications  . escitalopram (LEXAPRO) 10 MG tablet    Sig: Take 1 tablet (10 mg total) by mouth daily.    Dispense:  60 tablet    Refill:  0  How to take medication and black box warning was discussed for Lexapro and when to call.  Add fiber to diet recommended.    Advised patient call the office or your primary care  doctor for an appointment if no improvement within 72 hours or if any symptoms change or worsen at any time  Advised ER or urgent Care if after hours or on weekend. Call 911 for emergency symptoms at any time.Patinet verbalized understanding of all instructions given/reviewed and treatment plan and has no further questions or concerns at this time.         I[place] Hampshire Adalyna Godbee, FNP, have reviewed all documentation for this visit. The documentation on 03/14/20 for the exam, diagnosis, procedures, and orders are all accurate and complete.   Marcille Buffy, [place] [phone] (phone) [phone] (fax)  [place]

## 2020-03-14 NOTE — Addendum Note (Signed)
Addended by: Doreen Beam on: 03/14/2020 01:24 PM   Modules accepted: Orders

## 2020-03-14 NOTE — Patient Instructions (Addendum)
Continue taking Pantoprazole to help with reflux. Also add more fiber in diet such as Metamucil to help regulate your system.  Managing Anxiety, Adult After being diagnosed with an anxiety disorder, you may be relieved to know why you have felt or behaved a certain way. You may also feel overwhelmed about the treatment ahead and what it will mean for your life. With care and support, you can manage this condition and recover from it. How to manage lifestyle changes Managing stress and anxiety  Stress is your body's reaction to life changes and events, both good and bad. Most stress will last just a few hours, but stress can be ongoing and can lead to more than just stress. Although stress can play a major role in anxiety, it is not the same as anxiety. Stress is usually caused by something external, such as a deadline, test, or competition. Stress normally passes after the triggering event has ended.  Anxiety is caused by something internal, such as imagining a terrible outcome or worrying that something will go wrong that will devastate you. Anxiety often does not go away even after the triggering event is over, and it can become long-term (chronic) worry. It is important to understand the differences between stress and anxiety and to manage your stress effectively so that it does not lead to an anxious response. Talk with your health care provider or a counselor to learn more about reducing anxiety and stress. He or she may suggest tension reduction techniques, such as:  Music therapy. This can include creating or listening to music that you enjoy and that inspires you.  Mindfulness-based meditation. This involves being aware of your normal breaths while not trying to control your breathing. It can be done while sitting or walking.  Centering prayer. This involves focusing on a word, phrase, or sacred image that means something to you and brings you peace.  Deep breathing. To do this, expand your  stomach and inhale slowly through your nose. Hold your breath for 3-5 seconds. Then exhale slowly, letting your stomach muscles relax.  Self-talk. This involves identifying thought patterns that lead to anxiety reactions and changing those patterns.  Muscle relaxation. This involves tensing muscles and then relaxing them. Choose a tension reduction technique that suits your lifestyle and personality. These techniques take time and practice. Set aside 5-15 minutes a day to do them. Therapists can offer counseling and training in these techniques. The training to help with anxiety may be covered by some insurance plans. Other things you can do to manage stress and anxiety include:  Keeping a stress/anxiety diary. This can help you learn what triggers your reaction and then learn ways to manage your response.  Thinking about how you react to certain situations. You may not be able to control everything, but you can control your response.  Making time for activities that help you relax and not feeling guilty about spending your time in this way.  Visual imagery and yoga can help you stay calm and relax.  Medicines Medicines can help ease symptoms. Medicines for anxiety include:  Anti-anxiety drugs.  Antidepressants. Medicines are often used as a primary treatment for anxiety disorder. Medicines will be prescribed by a health care provider. When used together, medicines, psychotherapy, and tension reduction techniques may be the most effective treatment. Relationships Relationships can play a big part in helping you recover. Try to spend more time connecting with trusted friends and family members. Consider going to couples counseling, taking family education  classes, or going to family therapy. Therapy can help you and others better understand your condition. How to recognize changes in your anxiety Everyone responds differently to treatment for anxiety. Recovery from anxiety happens when  symptoms decrease and stop interfering with your daily activities at home or work. This may mean that you will start to:  Have better concentration and focus. Worry will interfere less in your daily thinking.  Sleep better.  Be less irritable.  Have more energy.  Have improved memory. It is important to recognize when your condition is getting worse. Contact your health care provider if your symptoms interfere with home or work and you feel like your condition is not improving. Follow these instructions at home: Activity  Exercise. Most adults should do the following: ? Exercise for at least 150 minutes each week. The exercise should increase your heart rate and make you sweat (moderate-intensity exercise). ? Strengthening exercises at least twice a week.  Get the right amount and quality of sleep. Most adults need 7-9 hours of sleep each night. Lifestyle   Eat a healthy diet that includes plenty of vegetables, fruits, whole grains, low-fat dairy products, and lean protein. Do not eat a lot of foods that are high in solid fats, added sugars, or salt.  Make choices that simplify your life.  Do not use any products that contain nicotine or tobacco, such as cigarettes, e-cigarettes, and chewing tobacco. If you need help quitting, ask your health care provider.  Avoid caffeine, alcohol, and certain over-the-counter cold medicines. These may make you feel worse. Ask your pharmacist which medicines to avoid. General instructions  Take over-the-counter and prescription medicines only as told by your health care provider.  Keep all follow-up visits as told by your health care provider. This is important. Where to find support You can get help and support from these sources:  Self-help groups.  Online and [employer].  A trusted spiritual leader.  Couples counseling.  Family education classes.  Family therapy. Where to find more information You may find that  joining a support group helps you deal with your anxiety. The following sources can help you locate counselors or support groups near you:  [place]: www.mentalhealth[place].net  Anxiety and Depression Association of [place] (ADAA): https://www.clark.net/  National Alliance on Mental Illness (NAMI): www.nami.org Contact a health care provider if you:  Have a hard time staying focused or finishing daily tasks.  Spend many hours a day feeling worried about everyday life.  Become exhausted by worry.  Start to have headaches, feel tense, or have nausea.  Urinate more than normal.  Have diarrhea. Get help right away if you have:  A racing heart and shortness of breath.  Thoughts of hurting yourself or others. If you ever feel like you may hurt yourself or others, or have thoughts about taking your own life, get help right away. You can go to your nearest emergency department or call:  Your local emergency services (911 in the [place]  A suicide crisis helpline, such as the [facility] at 613-074-0011. This is open 24 hours a day. Summary  Taking steps to learn and use tension reduction techniques can help calm you and help prevent triggering an anxiety reaction.  When used together, medicines, psychotherapy, and tension reduction techniques may be the most effective treatment.  Family, friends, and partners can play a big part in helping you recover from an anxiety disorder. This information is not intended to replace advice given  to you by your health care provider. Make sure you discuss any questions you have with your health care provider. Document Revised: 03/28/2019 Document Reviewed: 03/28/2019 Elsevier Patient Education  [place].  Escitalopram tablets What is this medicine? ESCITALOPRAM (es sye TAL oh pram) is used to treat depression and certain types of anxiety. This medicine may be used for other purposes; ask your health care  provider or pharmacist if you have questions. COMMON BRAND NAME(S): Lexapro What should I tell my health care provider before I take this medicine? They need to know if you have any of these conditions:  bipolar disorder or a family history of bipolar disorder  diabetes  glaucoma  heart disease  kidney or liver disease  receiving electroconvulsive therapy  seizures (convulsions)  suicidal thoughts, plans, or attempt by you or a family member  an unusual or allergic reaction to escitalopram, the related drug citalopram, other medicines, foods, dyes, or preservatives  pregnant or trying to become pregnant  breast-feeding How should I use this medicine? Take this medicine by mouth with a glass of water. Follow the directions on the prescription label. You can take it with or without food. If it upsets your stomach, take it with food. Take your medicine at regular intervals. Do not take it more often than directed. Do not stop taking this medicine suddenly except upon the advice of your doctor. Stopping this medicine too quickly may cause serious side effects or your condition may worsen. A special MedGuide will be given to you by the pharmacist with each prescription and refill. Be sure to read this information carefully each time. Talk to your pediatrician regarding the use of this medicine in children. Special care may be needed. Overdosage: If you think you have taken too much of this medicine contact a poison control center or emergency room at once. NOTE: This medicine is only for you. Do not share this medicine with others. What if I miss a dose? If you miss a dose, take it as soon as you can. If it is almost time for your next dose, take only that dose. Do not take double or extra doses. What may interact with this medicine? Do not take this medicine with any of the following medications:  certain medicines for fungal infections like fluconazole, itraconazole, ketoconazole,  posaconazole, voriconazole  cisapride  citalopram  dronedarone  linezolid  MAOIs like Carbex, Eldepryl, Marplan, Nardil, and Parnate  methylene blue (injected into a vein)  pimozide  thioridazine This medicine may also interact with the following medications:  alcohol  amphetamines  aspirin and aspirin-like medicines  carbamazepine  certain medicines for depression, anxiety, or psychotic disturbances  certain medicines for migraine headache like almotriptan, eletriptan, frovatriptan, naratriptan, rizatriptan, sumatriptan, zolmitriptan  certain medicines for sleep  certain medicines that treat or prevent blood clots like warfarin, enoxaparin, dalteparin  cimetidine  diuretics  dofetilide  fentanyl  furazolidone  isoniazid  lithium  metoprolol  NSAIDs, medicines for pain and inflammation, like ibuprofen or naproxen  other medicines that prolong the QT interval (cause an abnormal heart rhythm)  procarbazine  rasagiline  supplements like St. John's wort, kava kava, valerian  tramadol  tryptophan  ziprasidone This list may not describe all possible interactions. Give your health care provider a list of all the medicines, herbs, non-prescription drugs, or dietary supplements you use. Also tell them if you smoke, drink alcohol, or use illegal drugs. Some items may interact with your medicine. What should I watch for  while using this medicine? Tell your doctor if your symptoms do not get better or if they get worse. Visit your doctor or health care professional for regular checks on your progress. Because it may take several weeks to see the full effects of this medicine, it is important to continue your treatment as prescribed by your doctor. Patients and their families should watch out for new or worsening thoughts of suicide or depression. Also watch out for sudden changes in feelings such as feeling anxious, agitated, panicky, irritable, hostile,  aggressive, impulsive, severely restless, overly excited and hyperactive, or not being able to sleep. If this happens, especially at the beginning of treatment or after a change in dose, call your health care professional. Dennis Bast may get drowsy or dizzy. Do not drive, use machinery, or do anything that needs mental alertness until you know how this medicine affects you. Do not stand or sit up quickly, especially if you are an older patient. This reduces the risk of dizzy or fainting spells. Alcohol may interfere with the effect of this medicine. Avoid alcoholic drinks. Your mouth may get dry. Chewing sugarless gum or sucking hard candy, and drinking plenty of water may help. Contact your doctor if the problem does not go away or is severe. What side effects may I notice from receiving this medicine? Side effects that you should report to your doctor or health care professional as soon as possible:  allergic reactions like skin rash, itching or hives, swelling of the face, lips, or tongue  anxious  black, tarry stools  changes in vision  confusion  elevated mood, decreased need for sleep, racing thoughts, impulsive behavior  eye pain  fast, irregular heartbeat  feeling faint or lightheaded, falls  feeling agitated, angry, or irritable  hallucination, loss of contact with reality  loss of balance or coordination  loss of memory  painful or prolonged erections  restlessness, pacing, inability to keep still  seizures  stiff muscles  suicidal thoughts or other mood changes  trouble sleeping  unusual bleeding or bruising  unusually weak or tired  vomiting Side effects that usually do not require medical attention (report to your doctor or health care professional if they continue or are bothersome):  changes in appetite  change in sex drive or performance  headache  increased sweating  indigestion, nausea  tremors This list may not describe all possible side  effects. Call your doctor for medical advice about side effects. You may report side effects to FDA at 1-800-FDA-1088. Where should I keep my medicine? Keep out of reach of children. Store at room temperature between 15 and 30 degrees C (59 and 86 degrees F). Throw away any unused medicine after the expiration date. NOTE: This sheet is a summary. It may not cover all possible information. If you have questions about this medicine, talk to your doctor, pharmacist, or health care provider.  2020 Elsevier/Gold Standard (2018-10-17 11:21:44)       Psyllium granules or powder for solution What is this medicine? PSYLLIUM (SIL i yum) is a bulk-forming fiber laxative. This medicine is used to treat constipation. Increasing fiber in the diet may also help lower cholesterol and promote heart health for some people. This medicine may be used for other purposes; ask your health care provider or pharmacist if you have questions. COMMON BRAND NAME(S): Fiber Therapy, GenFiber, Geri-Mucil, Hydrocil, Konsyl, Metamucil, Metamucil MultiHealth, Mucilin, Natural Fiber Therapy, Reguloid What should I tell my health care provider before I take this medicine?  They need to know if you have any of these conditions:  blockage in your bowel  difficulty swallowing  inflammatory bowel disease  phenylketonuria  stomach or intestine problems  sudden change in bowel habits lasting more than 2 weeks  an unusual or allergic reaction to psyllium, other medicines, dyes, or preservatives  pregnant or trying or get pregnant  breast-feeding How should I use this medicine? Mix this medicine into a full glass (240 mL) of water or other cool drink. Take this medicine by mouth. Follow the directions on the package labeling, or take as directed by your health care professional. Take your medicine at regular intervals. Do not take your medicine more often than directed. Talk to your pediatrician regarding the use of this  medicine in children. While this drug may be prescribed for children as young as 28 years old for selected conditions, precautions do apply. Overdosage: If you think you have taken too much of this medicine contact a poison control center or emergency room at once. NOTE: This medicine is only for you. Do not share this medicine with others. What if I miss a dose? If you miss a dose, take it as soon as you can. If it is almost time for your next dose, take only that dose. Do not take double or extra doses. What may interact with this medicine? Interactions are not expected. Take this product at least 2 hours before or after other medicines. This list may not describe all possible interactions. Give your health care provider a list of all the medicines, herbs, non-prescription drugs, or dietary supplements you use. Also tell them if you smoke, drink alcohol, or use illegal drugs. Some items may interact with your medicine. What should I watch for while using this medicine? Check with your doctor or health care professional if your symptoms do not start to get better or if they get worse. Stop using this medicine and contact your doctor or health care professional if you have rectal bleeding or if you have to treat your constipation for more than 1 week. These could be signs of a more serious condition. Drink several glasses of water a day while you are taking this medicine. This will help to relieve constipation and prevent dehydration. What side effects may I notice from receiving this medicine? Side effects that you should report to your doctor or health care professional as soon as possible:  allergic reactions like skin rash, itching or hives, swelling of the face, lips, or tongue  breathing problems  chest pain  nausea, vomiting  rectal bleeding  trouble swallowing Side effects that usually do not require medical attention (report to your doctor or health care professional if they  continue or are bothersome):  bloating  gas  stomach cramps This list may not describe all possible side effects. Call your doctor for medical advice about side effects. You may report side effects to FDA at 1-800-FDA-1088. Where should I keep my medicine? Keep out of the reach of children. Store at room temperature between 15 and 30 degrees C (59 and 86 degrees F). Protect from moisture. Throw away any unused medicine after the expiration date. NOTE: This sheet is a summary. It may not cover all possible information. If you have questions about this medicine, talk to your doctor, pharmacist, or health care provider.  2020 Elsevier/Gold Standard (2018-03-22 15:41:08)

## 2020-03-14 NOTE — Progress Notes (Signed)
No appendicitis. Normal appearing corpeal luteum cyst on right ovary. Recommend follow up ultrasound of pelvis in 6 weeks to recheck this cyst. Ovaries do not look enlarged. This is likely the cause of her tenderness in right lower quadrant.  Small fat containing paraumbilical hernia. Ok on exam today. Can refer to surgeon for evaluation if she wants.

## 2020-03-15 ENCOUNTER — Other Ambulatory Visit: Payer: Self-pay | Admitting: Adult Health

## 2020-03-15 ENCOUNTER — Encounter: Payer: Self-pay | Admitting: Adult Health

## 2020-03-15 DIAGNOSIS — Q278 Other specified congenital malformations of peripheral vascular system: Secondary | ICD-10-CM

## 2020-03-15 DIAGNOSIS — F419 Anxiety disorder, unspecified: Secondary | ICD-10-CM

## 2020-03-17 ENCOUNTER — Other Ambulatory Visit: Payer: Self-pay

## 2020-03-17 ENCOUNTER — Encounter (HOSPITAL_COMMUNITY): Payer: Self-pay | Admitting: Emergency Medicine

## 2020-03-17 ENCOUNTER — Emergency Department (HOSPITAL_COMMUNITY)
Admission: EM | Admit: 2020-03-17 | Discharge: 2020-03-17 | Disposition: A | Discharge status: Home / Self Care | Payer: Medicaid Other | Attending: Emergency Medicine | Admitting: Emergency Medicine

## 2020-03-17 ENCOUNTER — Emergency Department (HOSPITAL_COMMUNITY): Payer: Medicaid Other

## 2020-03-17 DIAGNOSIS — Z79899 Other long term (current) drug therapy: Secondary | ICD-10-CM | POA: Insufficient documentation

## 2020-03-17 DIAGNOSIS — F17210 Nicotine dependence, cigarettes, uncomplicated: Secondary | ICD-10-CM | POA: Insufficient documentation

## 2020-03-17 DIAGNOSIS — M542 Cervicalgia: Secondary | ICD-10-CM | POA: Insufficient documentation

## 2020-03-17 DIAGNOSIS — R202 Paresthesia of skin: Secondary | ICD-10-CM | POA: Insufficient documentation

## 2020-03-17 HISTORY — DX: Anxiety disorder, unspecified: F41.9

## 2020-03-17 IMAGING — MR MR CERVICAL SPINE W/O CM
5 series · 37 of 48 positions shown · non-contrast
Comparison: Prior radiograph from [DATE].

CLINICAL DATA: Initial evaluation for acute neck pain, right arm
burning and weakness.

EXAM:
MRI CERVICAL SPINE WITHOUT CONTRAST
TECHNIQUE: Multiplanar, multisequence MR imaging of the cervical spine was
performed. No intravenous contrast was administered.

[Series 5: T2 · sagittal · 3.0mm · 0.56mm/px · 6 of 15 slices shown (1 of 2)]
[im 1/15]
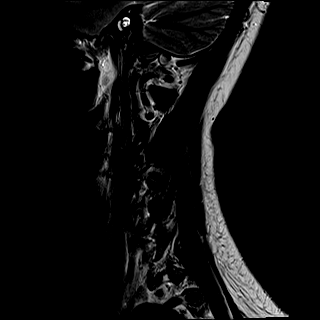
[im 3/15]
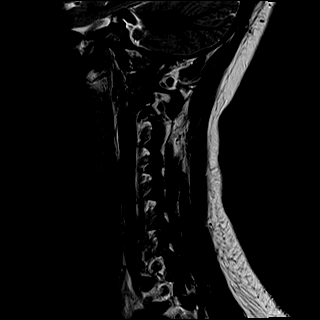
[im 6/15]
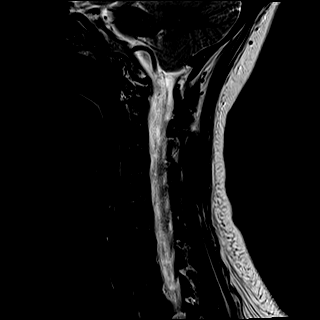
[im 9/15]
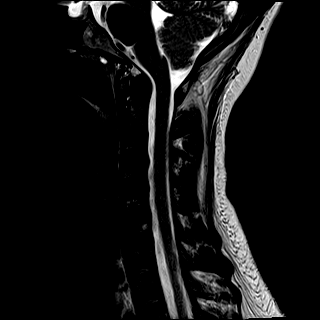
[im 12/15]
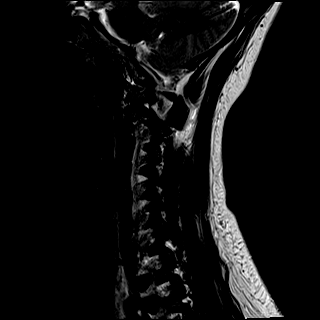
[im 15/15]
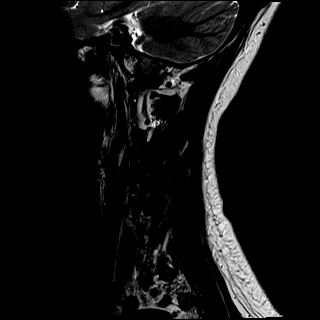

[Series 6: T1 · sagittal · 3.0mm · 0.56mm/px · 6 of 15 slices shown]
[im 1/15]
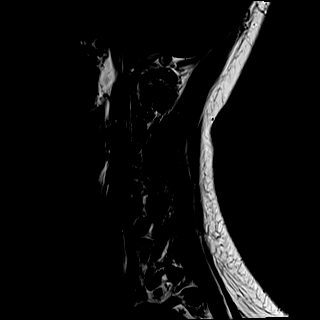
[im 3/15]
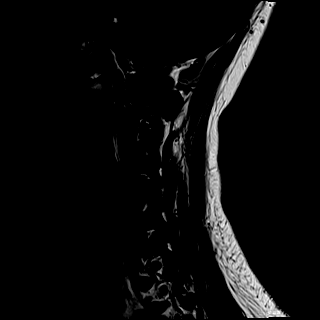
[im 6/15]
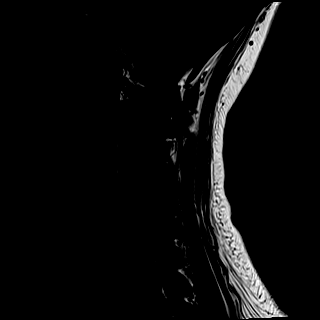
[im 9/15]
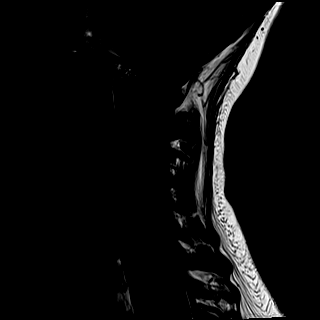
[im 12/15]
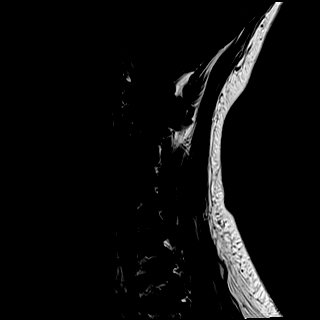
[im 15/15]
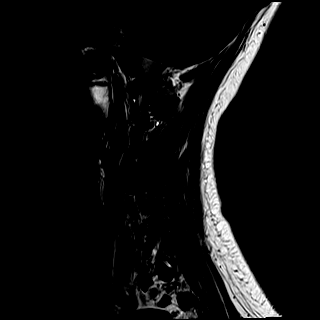

[Series 7: STIR · sagittal · 3.0mm · 0.70mm/px · 6 of 15 slices shown]
[im 1/15]
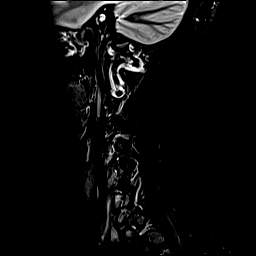
[im 3/15]
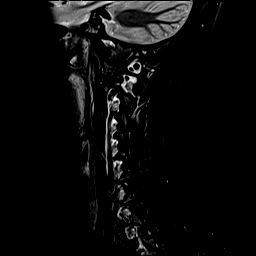
[im 6/15]
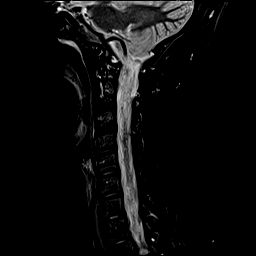
[im 9/15]
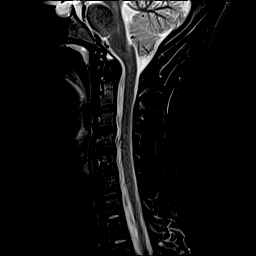
[im 12/15]
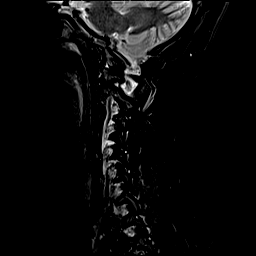
[im 15/15]
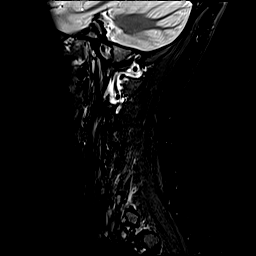

[Series 8: T2 · axial · 3.0mm · 0.66mm/px · z∈[-77,+50]mm · 11 of 40 slices shown (2 of 2)]
[im 1/40]
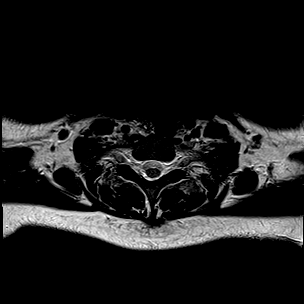
[im 3/40]
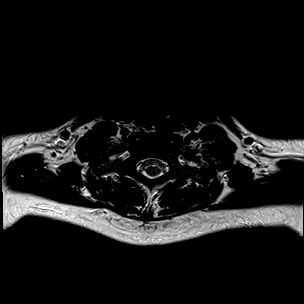
[im 6/40]
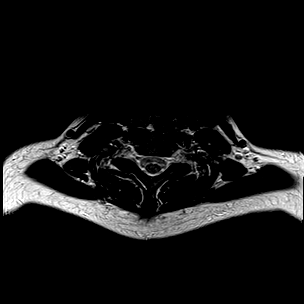
[im 9/40]
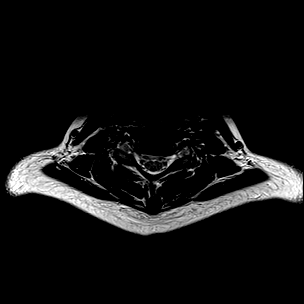
[im 12/40]
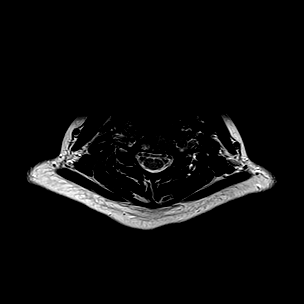
[im 17/40]
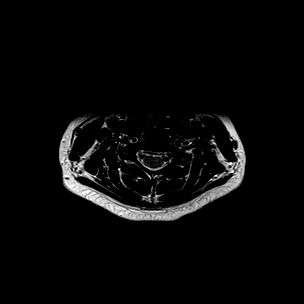
[im 20/40]
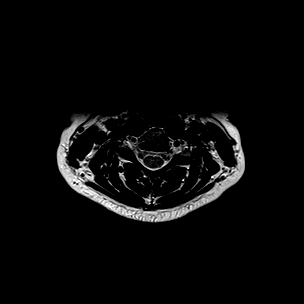
[im 23/40]
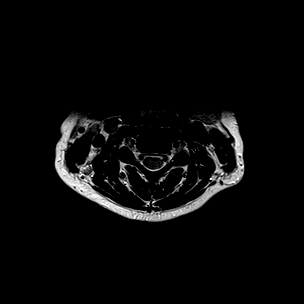
[im 28/40]
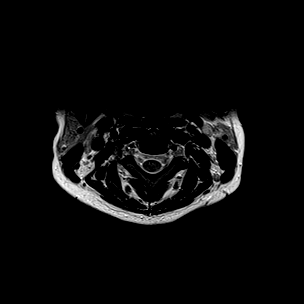
[im 34/40]
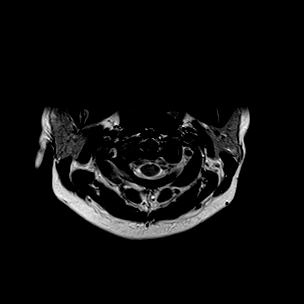
[im 40/40]
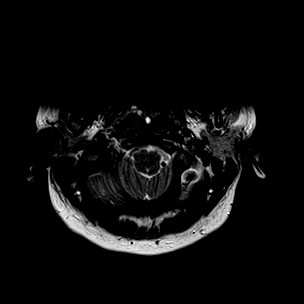

[Series 100: GRE · axial · 3.0mm · 0.39mm/px · z∈[-77,+50]mm · 8 of 40 slices shown]
[im 1/40]
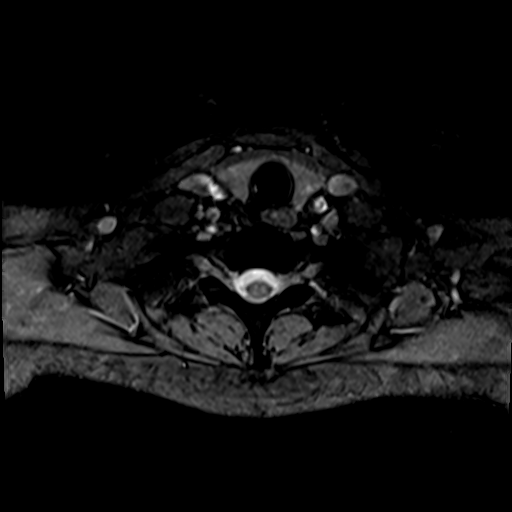
[im 6/40]
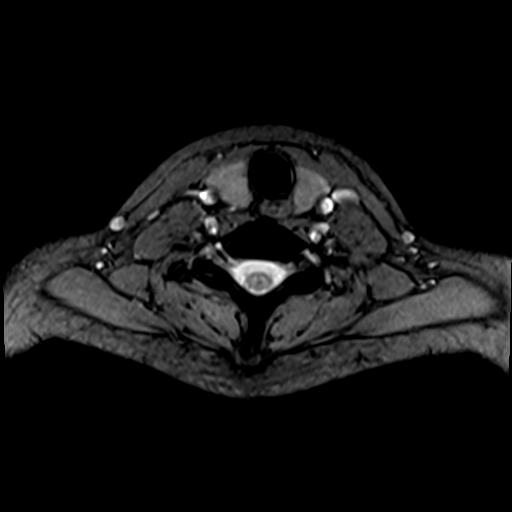
[im 12/40]
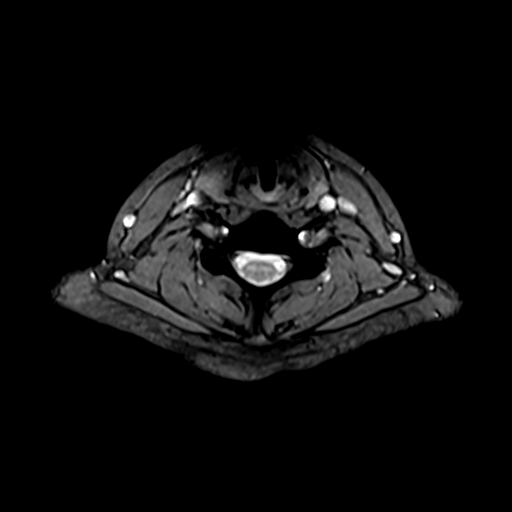
[im 17/40]
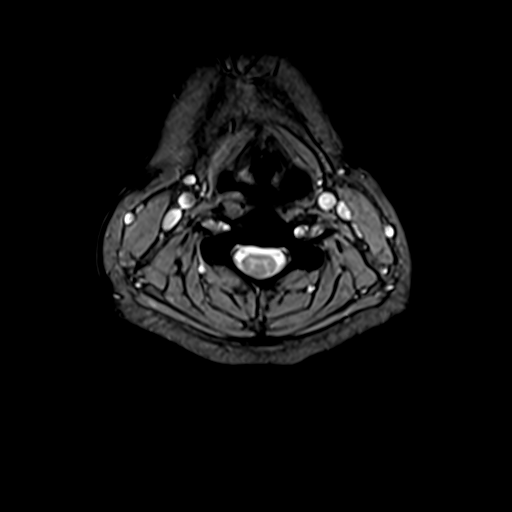
[im 23/40]
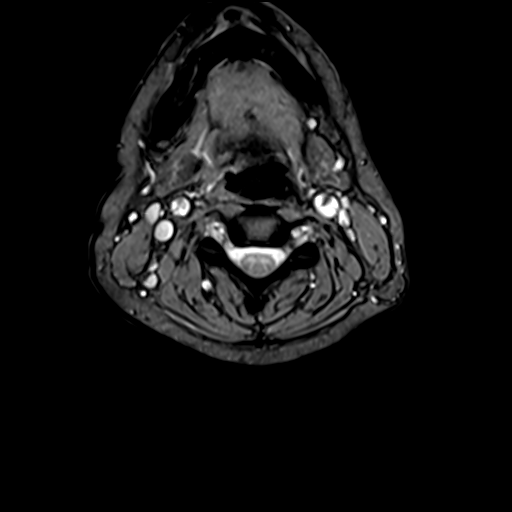
[im 28/40]
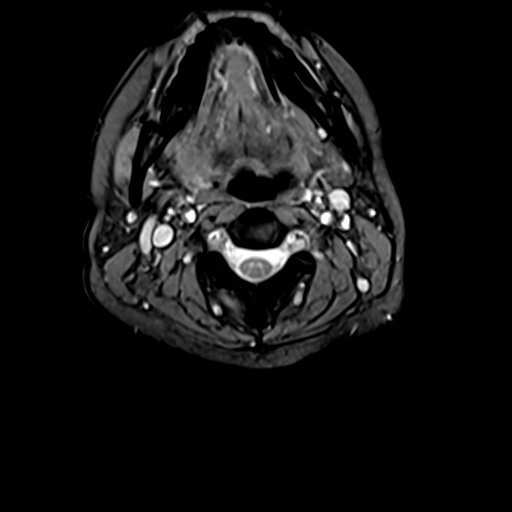
[im 34/40]
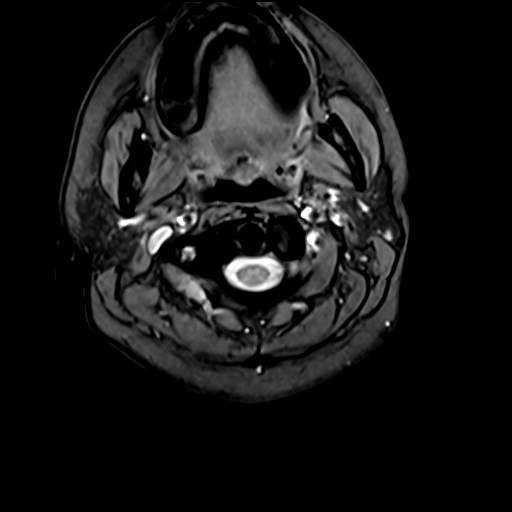
[im 40/40]
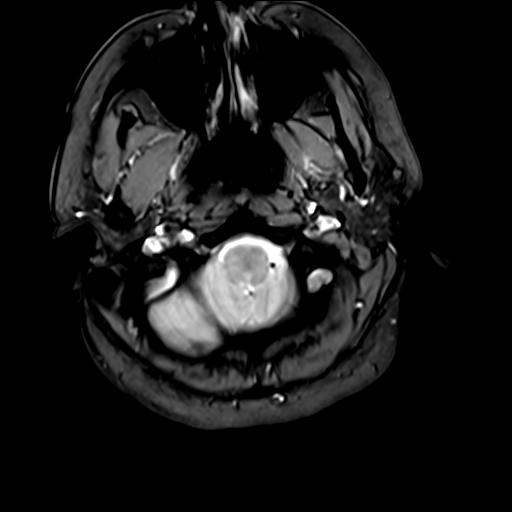

[37 of 48 positions shown; findings below may reference images not displayed]

FINDINGS: Alignment: Straightening of the normal cervical lordosis. No
listhesis or subluxation.

Vertebrae: Vertebral body height maintained without evidence for
acute or chronic fracture. Bone marrow signal intensity somewhat
diffusely decreased on T1 weighted imaging, nonspecific, but most
commonly related to anemia, smoking, or obesity. Subcentimeter
benign hemangioma noted within the C6 vertebral body. No other
discrete or worrisome osseous lesions. No abnormal marrow edema.

Cord: Signal intensity within the cervical spinal cord is normal.
Normal cord caliber morphology.

Posterior Fossa, vertebral arteries, paraspinal tissues: Incidental
note made of an empty sella. Visualized brain and posterior fossa
otherwise unremarkable. Craniocervical junction within normal
limits. Paraspinous and prevertebral soft tissues normal. Normal
flow voids seen within the vertebral arteries bilaterally.

Disc levels:

No significant disc pathology seen within the cervical spine. No
disc bulge or focal disc herniation. No significant canal or
foraminal stenosis. No neural impingement.
IMPRESSION: 1. Negative MRI of the cervical spine. No acute abnormality or
findings to explain patient's symptoms identified.
2. Decreased T1 signal intensity within the visualized bone marrow,
nonspecific, but most commonly related to anemia, smoking, or
obesity. Correlation with history and laboratory values suggested.
3. Empty sella. While this finding is often incidental in nature and
of no clinical significance, this can also be seen in the setting of
idiopathic intracranial hypertension.

## 2020-03-17 IMAGING — MR MR MRA NECK WO/W CM
5 series · 44 of 48 positions shown · IV contrast (gadavist)
Comparison: None.

CLINICAL DATA: Initial evaluation for neck trauma, focal neural
deficit.

EXAM:
MRA NECK WITHOUT AND WITH CONTRAST
TECHNIQUE: Multiplanar and multiecho pulse sequences of the neck were obtained
without and with intravenous contrast. Angiographic images of the
neck were obtained using MRA technique without and with intravenous
contrast.
CONTRAST:  7.5mL GADAVIST GADOBUTROL 1 MMOL/ML IV SOLN

[Series 16: tof_fl3d_tra_iso · axial · 0.6mm · 0.52mm/px · z∈[-44,+42]mm · 17 of 146 slices shown]
[im 1/146]
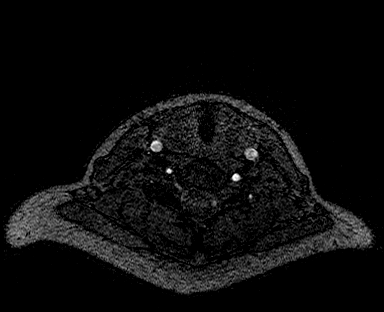
[im 10/146]
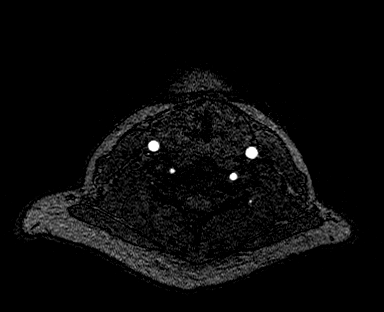
[im 19/146]
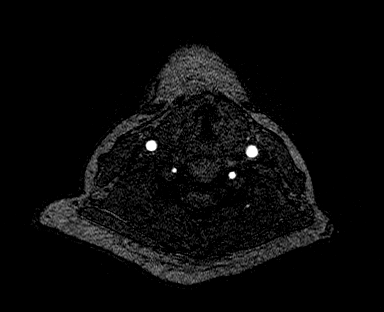
[im 28/146]
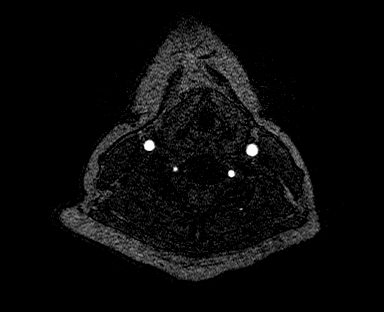
[im 37/146]
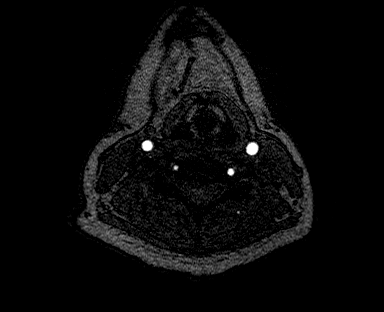
[im 46/146]
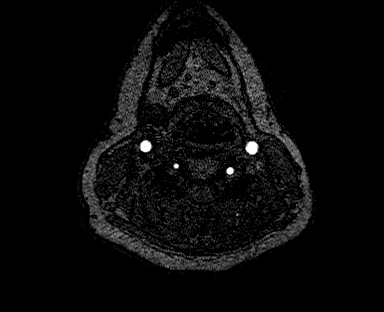
[im 55/146]
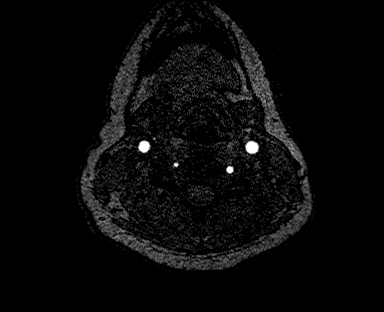
[im 64/146]
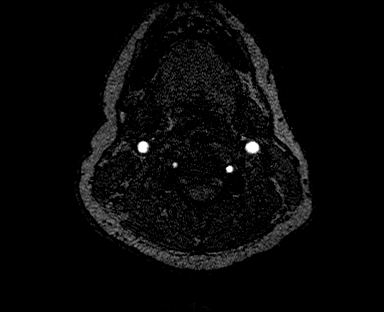
[im 73/146]
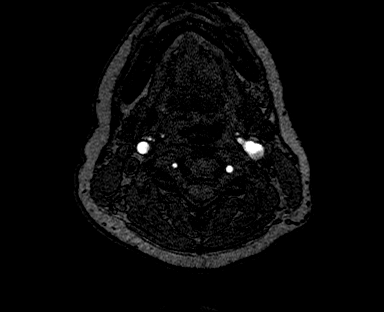
[im 82/146]
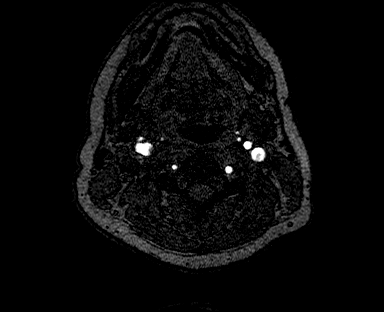
[im 91/146]
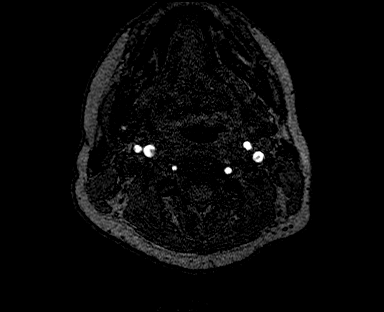
[im 100/146]
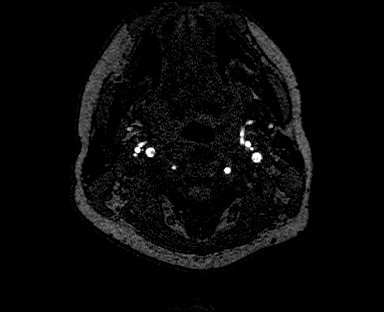
[im 109/146]
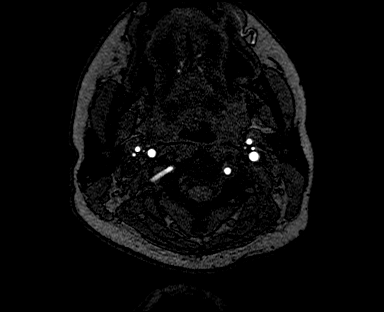
[im 118/146]
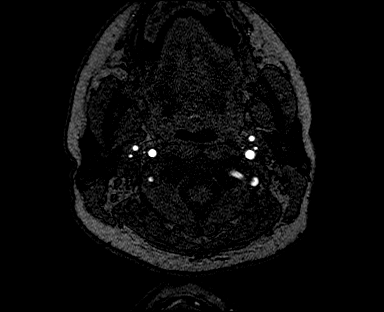
[im 127/146]
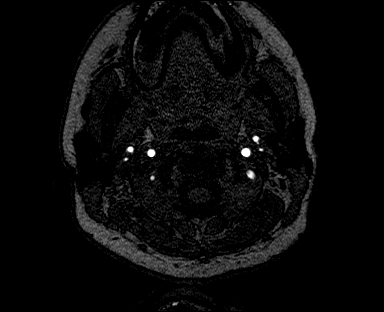
[im 136/146]
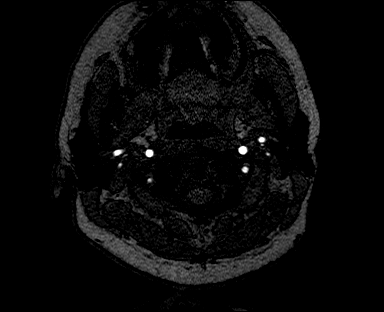
[im 146/146]
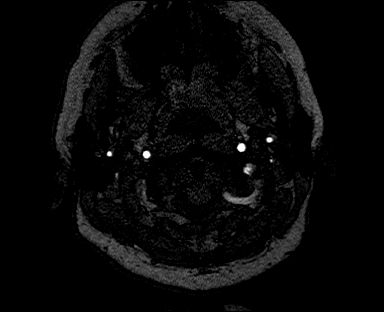

[Series 19: t1_se_fs_tra_blood-suppr. · axial · 4.0mm · 0.47mm/px · z∈[-42,+49]mm · 2 of 20 slices shown]
[im 1/20]
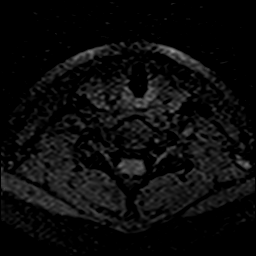
[im 20/20]
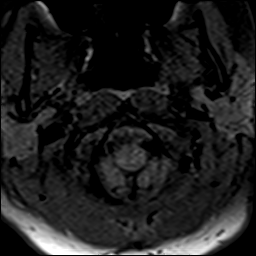

[Series 21: angio_fl3d_cor_post_ttc=3.0s · coronal · 0.9mm · 0.85mm/px · 9 of 85 slices shown]
[im 1/85]
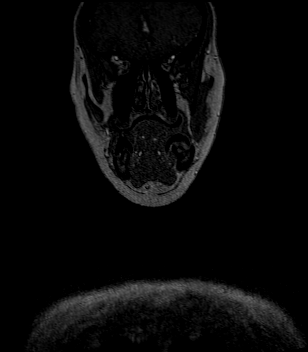
[im 10/85]
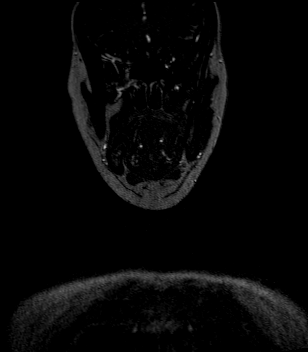
[im 19/85]
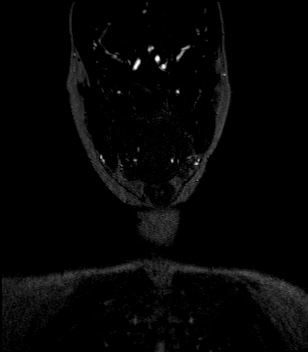
[im 29/85]
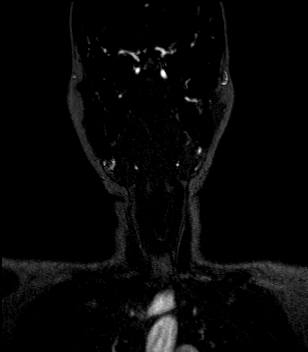
[im 38/85]
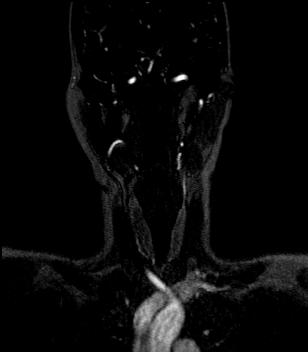
[im 47/85]
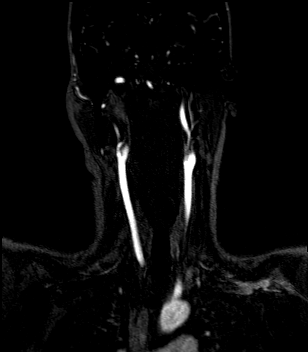
[im 57/85]
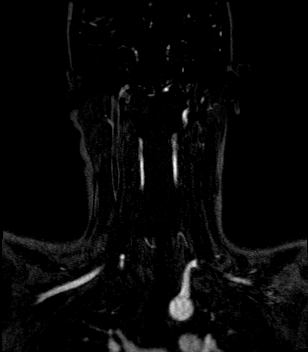
[im 75/85]
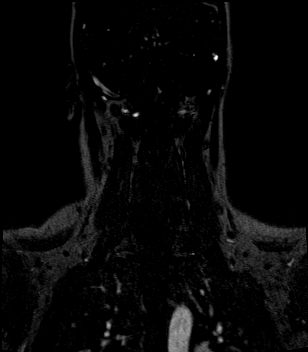
[im 85/85]
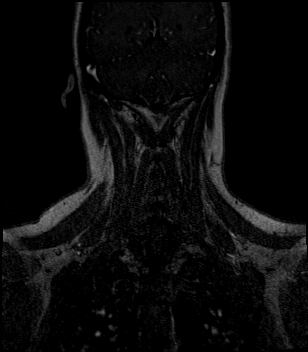

[Series 22: angio_fl3d_cor_post_ttc=3.0s_moco-adv · coronal · 0.9mm · 0.85mm/px · 8 of 85 slices shown]
[im 1/85]
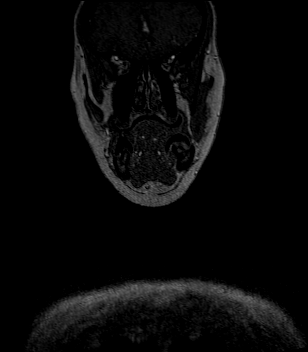
[im 10/85]
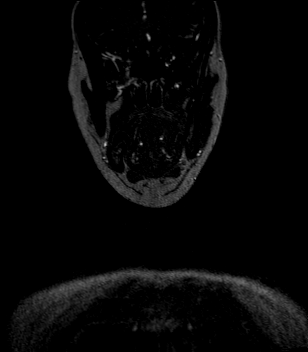
[im 29/85]
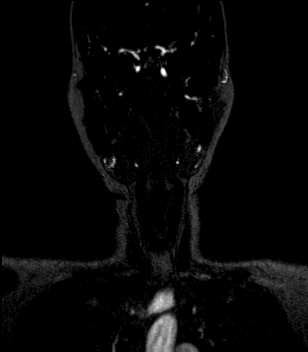
[im 38/85]
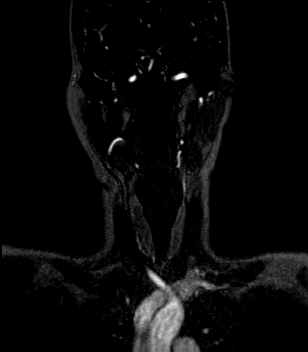
[im 47/85]
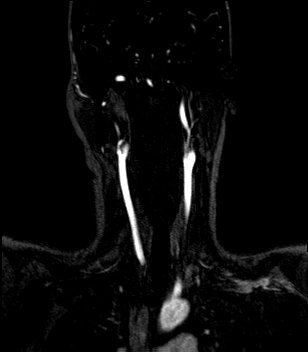
[im 57/85]
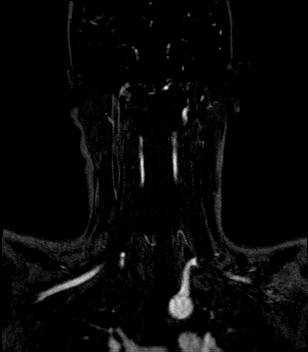
[im 75/85]
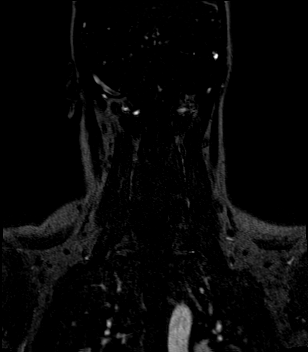
[im 85/85]
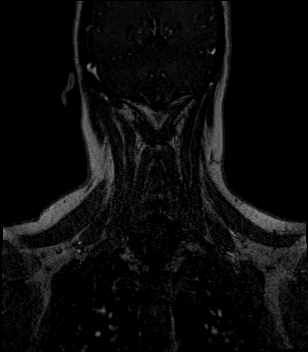

[Series 23: angio_fl3d_cor_post_ttc=3.0s_moco-adv_sub · coronal · 0.9mm · 0.85mm/px · 8 of 82 slices shown]
[im 1/82]
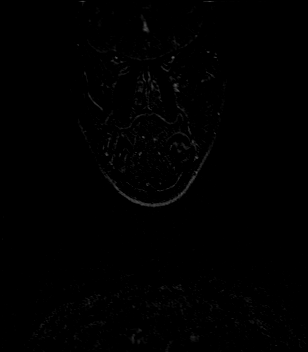
[im 11/82]
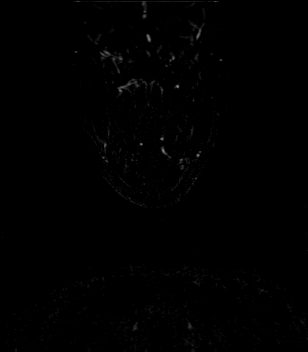
[im 21/82]
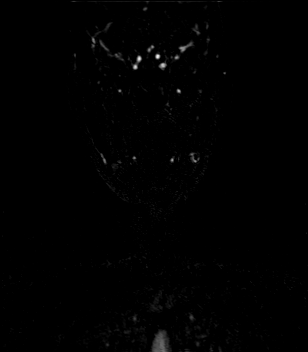
[im 31/82]
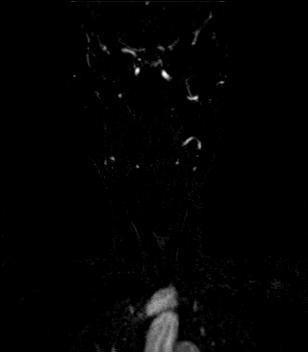
[im 51/82]
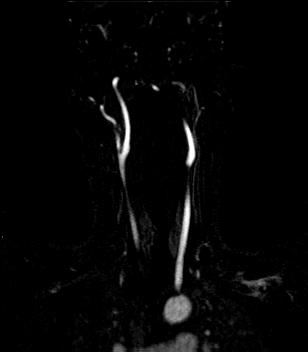
[im 61/82]
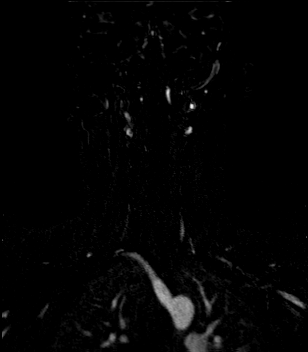
[im 71/82]
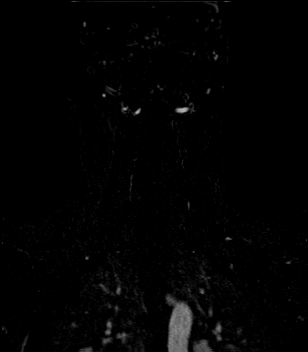
[im 82/82]
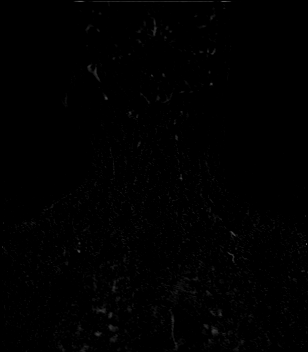

[44 of 48 positions shown; findings below may reference images not displayed]

FINDINGS: AORTIC ARCH: Visualized aortic arch of normal caliber. Incidental
note made of an aberrant right subclavian artery. No hemodynamically
significant stenosis or other vascular abnormality about the origin
of the great vessels.

RIGHT CAROTID SYSTEM: Right common and internal carotid arteries
widely patent without stenosis, dissection or occlusion.

LEFT CAROTID SYSTEM: Left common and internal carotid arteries
widely patent without stenosis, dissection, or occlusion.

VERTEBRAL ARTERIES: Both vertebral arteries arise from the
subclavian arteries. Left vertebral artery dominant. Vertebral
arteries widely patent without stenosis, dissection or occlusion.
IMPRESSION: 1. Normal MRA of the neck. No evidence for dissection or other acute
vascular abnormality.
2. Incidental aberrant right subclavian artery.

## 2020-03-17 MED ORDER — GADOBUTROL 1 MMOL/ML IV SOLN
7.5000 mL | Freq: Once | INTRAVENOUS | Status: AC | PRN
  Administered 2020-03-17: 7.5 mL via INTRAVENOUS

## 2020-03-17 MED ORDER — LORAZEPAM 2 MG/ML IJ SOLN
1.0000 mg | Freq: Once | INTRAMUSCULAR | Status: AC
  Administered 2020-03-17: 20:00:00 1 mg via INTRAVENOUS
  Filled 2020-03-17: qty 1

## 2020-03-17 NOTE — Discharge Instructions (Signed)
Continue to take your gabapentin and tylenol/ibuprofen as needed for pain.

## 2020-03-17 NOTE — ED Triage Notes (Signed)
C/o burning and tingling to R side of neck, arm, and leg x 2 days.  States she was diagnosed with anxiety on Thursday by PCP.  Took 1 dose of Lexapro and states she didn't like how it made her feel.  No arm drift.

## 2020-03-17 NOTE — ED Provider Notes (Addendum)
[facility] Provider Note   CSN: [id number] Arrival date & time: 03/17/20  1638     History Chief Complaint  Patient presents with  . Neck Pain    Isabel [name] is a 29 y.o. female.  Patient is a 29 year old female with a history of apparent right subclavian artery, migraines, bilateral varicose veins and sciatica who is presenting today with ongoing pain in her right arm and burning sensation in her arm and neck.  She reports this started around April 10 so approximately 1 month ago.  She reports its gradually getting worse and now she finds it hard to function and even do her job because her right arm feels weak and it is hard to use it.  She reports it comes and goes on the extent of how bad it feels.  She always has a burning sensation in her neck which she points to the back of her neck.  She has been seen multiple times recently in the emergency room, urgent care and PCP.  They have worked her up for URIs, sinus infections, lymphadenopathy in her neck, chest CTs to rule out PE all which have been negative.  Patient has completed courses of antibiotics but reports the pain in her arm and neck have not improved.  She has not had fever, tick bites and denies any cough, congestion or shortness of breath at this time.  She does report history of intermittent chest pain but that has been ongoing and is not the reason for her visit today.  She was recently started on Lexapro this week because they felt that she had anxiety.  However she took 1 dose and reports she will never take that again because it made her feel terrible.  She does have a history of sciatica in her right leg and states that the pain she gets intermittently in her right leg feels more like her sciatica and pain from her varicose veins for which she takes gabapentin.  Patient denies IV drug use but does use marijuana occasionally, significant alcohol use and does smoke cigarettes.  The  history is provided by the patient.  Neck Pain Pain location:  Occipital region and R side Quality:  Burning and shooting Pain radiates to:  R shoulder, R arm, R forearm and R hand Pain severity:  Moderate Pain is:  Unable to specify Onset quality:  Gradual Duration:  1 month Timing:  Constant Progression:  Worsening Chronicity:  New Context: not fall, not jumping from heights, not lifting a heavy object and not recent injury   Relieved by:  Nothing Exacerbated by: seems to be worse with lifting. Ineffective treatments: not better with gabapentin. Associated symptoms: numbness, tingling and weakness   Associated symptoms: no bladder incontinence, no bowel incontinence, no fever, no headaches and no visual change        Past Medical History:  Diagnosis Date  . Aberrant right subclavian artery   . Anxiety   . Dysmenorrhea   . Family history of breast cancer in mother   . Genetic testing 04/25/18   PALB2 analysis @ [facility] - Familial pathogenic PALB2 mutation detected  . Migraines   . 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 Anxiety 03/14/2020  . RUQ pain 03/14/2020  . Periumbilical abdominal pain 03/14/2020  . History of laparoscopic cholecystectomy  03/14/2020  . Right lower quadrant abdominal pain 03/14/2020  . Corpus luteum cyst of right ovary 03/14/2020  . Umbilical hernia without obstruction or gangrene 03/14/2020  . Sensation of chest pressure 02/26/2020  . Shortness of breath 02/26/2020  . Genetic testing   . Monoallelic mutation of PALB2 gene   . Family history of breast cancer in mother   . 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 Positive urine drug screen 03/27/2016  . Tobacco user 02/26/2016  . Irregular periods/menstrual cycles 02/26/2016  . Neck pain 02/22/2012  . Epigastric abdominal pain  09/25/2008  . LOW BACK PAIN 08/28/2008  . VASOVAGAL SYNCOPE 08/28/2008    Past Surgical History:  Procedure Laterality Date  . CHOLECYSTECTOMY       OB History    Gravida  4   Para  2   Term  2   Preterm      AB  2   Living  1     SAB  2   TAB      Ectopic      Multiple  0   Live Births  1           Family History  Problem Relation Age of Onset  . Breast cancer Mother 81       currently 76  . Hypertension Father   . Arthritis Other   . Diabetes Other   . Cancer Other   . Diabetes Maternal Grandmother   . Diabetes Maternal Grandfather   . Diabetes Paternal Grandmother   . Diabetes Paternal Grandfather   . Breast cancer Maternal Aunt 15       currently 47; PALB2 mutation  . Colon cancer Neg Hx   . Heart disease Neg Hx     Social History   Tobacco Use  . Smoking status: Current Every Day Smoker    Packs/day: 0.25    Types: Cigarettes  . Smokeless tobacco: Never Used  Substance Use Topics  . Alcohol use: No  . Drug use: Yes    Types: Marijuana    Home Medications Prior to Admission medications   Medication Sig Start Date End Date Taking? Authorizing Provider  escitalopram (LEXAPRO) 10 MG tablet Take 1 tablet (10 mg total) by mouth daily. 03/14/20   Flinchum, Kelby Aline, FNP  gabapentin (NEURONTIN) 100 MG capsule Take 100 mg by mouth 3 (three) times daily.    [provider]  levonorgestrel (MIRENA) 20 MCG/24HR IUD 1 each by Intrauterine route once.    [provider]  pantoprazole (PROTONIX) 40 MG tablet Take 1 tablet (40 mg total) by mouth daily before breakfast. 02/27/20   Karen Kitchens, NP  albuterol (VENTOLIN HFA) 108 (90 Base) MCG/ACT inhaler Inhale 2 puffs into the lungs every 4 (four) hours as needed for wheezing or shortness of breath. Patient not taking: Reported on 02/26/2020 02/20/20 02/27/20  Sharion Balloon, NP  famotidine (PEPCID) 20 MG tablet Take 1 tablet (20 mg total) by mouth 2 (two) times daily. 02/24/20 02/27/20   Lilia Pro., MD    Allergies    Amoxicillin and Penicillins  Review of Systems   Review of Systems  Constitutional: Negative for fever.  Gastrointestinal: Negative for bowel incontinence.  Genitourinary: Negative for bladder incontinence.  Musculoskeletal: Positive for neck pain.  Neurological: Positive for tingling, weakness and numbness. Negative for headaches.  All other systems reviewed and are negative.   Physical Exam Updated Vital Signs BP 114/79   Pulse 88  Temp 98.4 F (36.9 C) (Oral)   Resp 16   LMP 02/26/2020 (Exact Date)   SpO2 99%   Physical Exam Vitals and nursing note reviewed.  Constitutional:      General: She is not in acute distress.    Appearance: Normal appearance. She is well-developed and normal weight.     Comments: Intermittently tearful  HENT:     Head: Normocephalic and atraumatic.     Right Ear: Tympanic membrane normal.     Left Ear: Tympanic membrane normal.     Nose: Nose normal.     Mouth/Throat:     Mouth: Mucous membranes are moist.  Eyes:     Pupils: Pupils are equal, round, and reactive to light.  Neck:      Comments: No palpable cervical nodes at this time Cardiovascular:     Rate and Rhythm: Normal rate and regular rhythm.     Heart sounds: Normal heart sounds. No murmur. No friction rub.  Pulmonary:     Effort: Pulmonary effort is normal.     Breath sounds: Normal breath sounds. No wheezing or rales.  Abdominal:     General: Bowel sounds are normal. There is no distension.     Palpations: Abdomen is soft.     Tenderness: There is no abdominal tenderness. There is no guarding or rebound.  Musculoskeletal:        General: No tenderness. Normal range of motion.     Cervical back: Normal range of motion.     Comments: No edema  Lymphadenopathy:     Cervical:     Right cervical: No superficial, deep or posterior cervical adenopathy.    Left cervical: No superficial, deep or posterior cervical adenopathy.  Skin:     General: Skin is warm and dry.     Findings: No rash.  Neurological:     Mental Status: She is alert and oriented to person, place, and time.     Cranial Nerves: No cranial nerve deficit.     Comments: Sensation intact in bilateral upper extremities.  5 out of 5 hand grip strength bilaterally, 5 out of 5 bicep strength bilaterally, mild weakness with deltoid on the right compared to left.  2+ radial pulse bilaterally.  5/5 strength in bilateral lower extremities  Psychiatric:        Attention and Perception: Attention normal.        Mood and Affect: Affect is tearful.        Behavior: Behavior normal.     ED Results / Procedures / Treatments   Labs (all labs ordered are listed, but only abnormal results are displayed) Labs Reviewed - No data to display  EKG None  Radiology MR Angiogram Neck W or Wo Contrast  Result Date: 03/17/2020 CLINICAL DATA:  Initial evaluation for neck trauma, focal neural deficit. EXAM: MRA NECK WITHOUT AND WITH CONTRAST TECHNIQUE: Multiplanar and multiecho pulse sequences of the neck were obtained without and with intravenous contrast. Angiographic images of the neck were obtained using MRA technique without and with intravenous contrast. CONTRAST:  7.36m GADAVIST GADOBUTROL 1 MMOL/ML IV SOLN COMPARISON:  None. FINDINGS: AORTIC ARCH: Visualized aortic arch of normal caliber. Incidental note made of an aberrant right subclavian artery. No hemodynamically significant stenosis or other vascular abnormality about the origin of the great vessels. RIGHT CAROTID SYSTEM: Right common and internal carotid arteries widely patent without stenosis, dissection or occlusion. LEFT CAROTID SYSTEM: Left common and internal carotid arteries widely patent without stenosis, dissection,  or occlusion. VERTEBRAL ARTERIES: Both vertebral arteries arise from the subclavian arteries. Left vertebral artery dominant. Vertebral arteries widely patent without stenosis, dissection or occlusion.  IMPRESSION: 1. Normal MRA of the neck. No evidence for dissection or other acute vascular abnormality. 2. Incidental aberrant right subclavian artery. Electronically Signed   By: Jeannine Boga M.D.   On: 03/17/2020 23:06   MR Cervical Spine Wo Contrast  Result Date: 03/17/2020 CLINICAL DATA:  Initial evaluation for acute neck pain, right arm burning and weakness. EXAM: MRI CERVICAL SPINE WITHOUT CONTRAST TECHNIQUE: Multiplanar, multisequence MR imaging of the cervical spine was performed. No intravenous contrast was administered. COMPARISON:  Prior radiograph from 01/31/2012. FINDINGS: Alignment: Straightening of the normal cervical lordosis. No listhesis or subluxation. Vertebrae: Vertebral body height maintained without evidence for acute or chronic fracture. Bone marrow signal intensity somewhat diffusely decreased on T1 weighted imaging, nonspecific, but most commonly related to anemia, smoking, or obesity. Subcentimeter benign hemangioma noted within the C6 vertebral body. No other discrete or worrisome osseous lesions. No abnormal marrow edema. Cord: Signal intensity within the cervical spinal cord is normal. Normal cord caliber morphology. Posterior Fossa, vertebral arteries, paraspinal tissues: Incidental note made of an empty sella. Visualized brain and posterior fossa otherwise unremarkable. Craniocervical junction within normal limits. Paraspinous and prevertebral soft tissues normal. Normal flow voids seen within the vertebral arteries bilaterally. Disc levels: No significant disc pathology seen within the cervical spine. No disc bulge or focal disc herniation. No significant canal or foraminal stenosis. No neural impingement. IMPRESSION: 1. Negative MRI of the cervical spine. No acute abnormality or findings to explain patient's symptoms identified. 2. Decreased T1 signal intensity within the visualized bone marrow, nonspecific, but most commonly related to anemia, smoking, or obesity.  Correlation with history and laboratory values suggested. 3. Empty sella. While this finding is often incidental in nature and of no clinical significance, this can also be seen in the setting of idiopathic intracranial hypertension. Electronically Signed   By: Jeannine Boga M.D.   On: 03/17/2020 23:00    Procedures Procedures (including critical care time)  Medications Ordered in ED Medications  LORazepam (ATIVAN) injection 1 mg (1 mg Intravenous Given 03/17/20 2013)    ED Course  I have reviewed the triage vital signs and the nursing notes.  Pertinent labs & imaging results that were available during my care of the patient were reviewed by me and considered in my medical decision making (see chart for details).    MDM Rules/Calculators/A&P                      29 year old female who has been seen multiple times in the last month due to ongoing neck pain radiating into her arm.  She had multiple imaging and medications but reports none of it has helped.  She has had imaging of her chest but is not had any imaging of her neck.  She describes as a burning pain which sounds like potential radiculopathy.  It does not follow a specific nerve root however she does not have anterior neck pain more concerning for carotid dissection but could potentially have vertebral dissection.  Low suspicion for meningitis or acute intracranial pathology.  She does report some pain in her right leg however this is a more chronic problem and do not feel that it is related to her new symptoms of right arm pain, weakness and burning.  Given patient's ongoing symptoms and worsening weakness to the point where she is  unable to function she has been seeing her doctor and they felt that she had anxiety and started Lexapro however she felt worse after taking 1 dose and did not take anymore.  We will do an MR cervical spine to look for stenosis and an MRA to evaluate for dissection as patient does have an aberrant right  subclavian artery this could also be a contributing factor.  11:29 PM MR neck and MRA of neck neg for acute pathology to explain pt's sx.  Will have pt f/u with neurology for further eval for paresthesias.  Clear for d/c today.  MDM Number of Diagnoses or Management Options Paresthesia of right upper extremity: new, needed workup   Amount and/or Complexity of Data Reviewed Tests in the radiology section of CPT: ordered and reviewed Independent visualization of images, tracings, or specimens: yes  Risk of Complications, Morbidity, and/or Mortality Presenting problems: moderate Diagnostic procedures: low Management options: low  Patient Progress Patient progress: stable   Final Clinical Impression(s) / ED Diagnoses Final diagnoses:  Paresthesia of right upper extremity    Rx / DC Orders ED Discharge Orders    None       Blanchie Dessert, MD 03/17/20 2330    Blanchie Dessert, MD 03/17/20 2330

## 2020-03-17 NOTE — ED Notes (Signed)
Patient verbalizes understanding of discharge instructions. Opportunity for questioning and answers were provided. Armband removed by staff, pt discharged from ED.  

## 2020-03-18 ENCOUNTER — Encounter: Payer: Self-pay | Admitting: Neurology

## 2020-03-18 ENCOUNTER — Other Ambulatory Visit: Payer: Self-pay | Admitting: Adult Health

## 2020-03-18 DIAGNOSIS — F419 Anxiety disorder, unspecified: Secondary | ICD-10-CM

## 2020-03-18 DIAGNOSIS — R202 Paresthesia of skin: Secondary | ICD-10-CM

## 2020-03-18 NOTE — Progress Notes (Signed)
Orders Placed This Encounter  Procedures  . DG Lumbar Spine Complete    Standing Status:   Future    Standing Expiration Date:   04/18/2020    Order Specific Question:   Reason for Exam (SYMPTOM  OR DIAGNOSIS REQUIRED)    Answer:   paresthesia right leg - chronic. baseline x ray    Order Specific Question:   Is patient pregnant?    Answer:   No    Comments:   denies     Order Specific Question:   Preferred imaging location?    Answer:   [facility]    Order Specific Question:   Radiology Contrast Protocol - do NOT remove file path    Answer:   [email or web address]    Paresthesia of right lower extremity - Plan: DG Lumbar Spine Complete  Anxiety   Reviewed ED chart.

## 2020-03-19 ENCOUNTER — Encounter: Payer: Self-pay | Admitting: Adult Health

## 2020-03-20 ENCOUNTER — Other Ambulatory Visit: Payer: Self-pay | Admitting: Adult Health

## 2020-03-20 MED ORDER — GABAPENTIN 100 MG PO CAPS: 200.0000 mg | ORAL_CAPSULE | Freq: Two times a day (BID) | ORAL | 0 refills | Status: DC

## 2020-03-22 ENCOUNTER — Ambulatory Visit
Admission: RE | Admit: 2020-03-22 | Discharge: 2020-03-22 | Disposition: A | Discharge status: Home / Self Care | Payer: Self-pay | Source: Ambulatory Visit | Attending: Adult Health | Admitting: Adult Health

## 2020-03-22 ENCOUNTER — Other Ambulatory Visit: Payer: Self-pay

## 2020-03-22 ENCOUNTER — Encounter: Payer: Self-pay | Admitting: Emergency Medicine

## 2020-03-22 ENCOUNTER — Emergency Department
Admission: EM | Admit: 2020-03-22 | Discharge: 2020-03-22 | Disposition: A | Discharge status: Home / Self Care | Payer: PRIVATE HEALTH INSURANCE | Attending: Emergency Medicine | Admitting: Emergency Medicine

## 2020-03-22 ENCOUNTER — Emergency Department: Payer: PRIVATE HEALTH INSURANCE

## 2020-03-22 DIAGNOSIS — M79604 Pain in right leg: Secondary | ICD-10-CM | POA: Diagnosis present

## 2020-03-22 DIAGNOSIS — Z79899 Other long term (current) drug therapy: Secondary | ICD-10-CM | POA: Diagnosis not present

## 2020-03-22 DIAGNOSIS — N8311 Corpus luteum cyst of right ovary: Secondary | ICD-10-CM | POA: Insufficient documentation

## 2020-03-22 DIAGNOSIS — F17210 Nicotine dependence, cigarettes, uncomplicated: Secondary | ICD-10-CM | POA: Insufficient documentation

## 2020-03-22 IMAGING — US US EXTREM LOW VENOUS*R*
1 series · 14 of 24 positions shown · non-contrast
Comparison: None.

CLINICAL DATA: Right lower extremity pain.

EXAM:
Right LOWER EXTREMITY VENOUS DOPPLER ULTRASOUND
TECHNIQUE: Gray-scale sonography with compression, as well as color and duplex
ultrasound, were performed to evaluate the deep venous system(s)
from the level of the common femoral vein through the popliteal and
proximal calf veins.

[Series 1: us venous img lower uni right (dvt) · portal-venous · 14 of 27 slices shown]
[im 1/27]
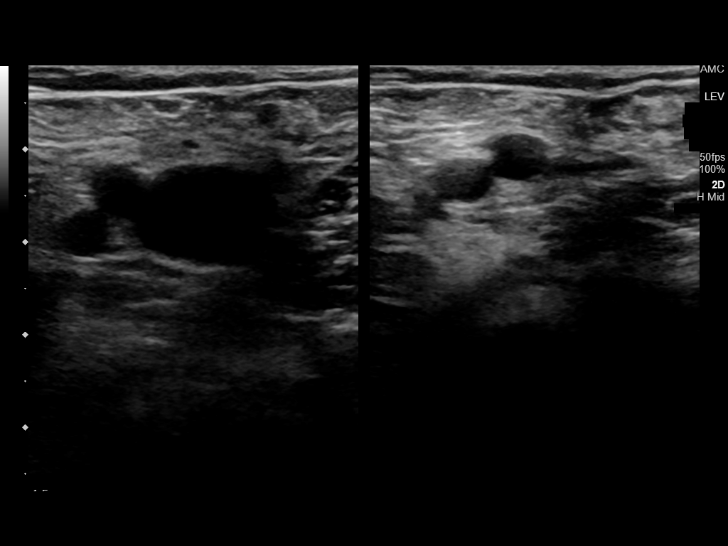
[im 3/27]
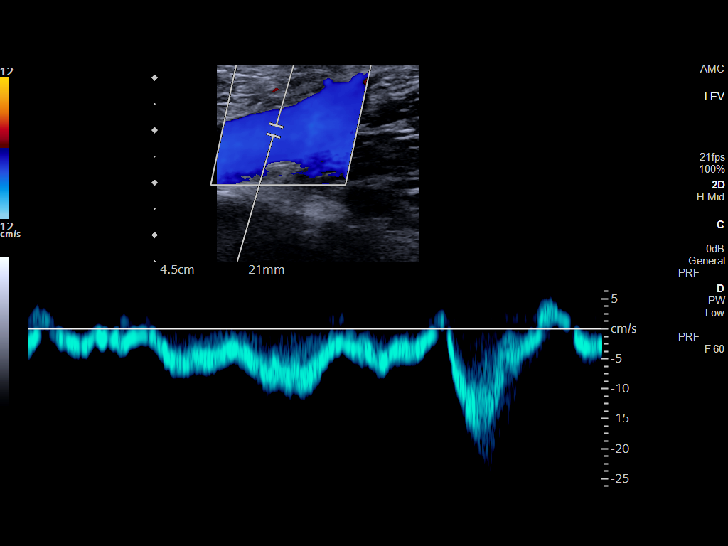
[im 5/27]
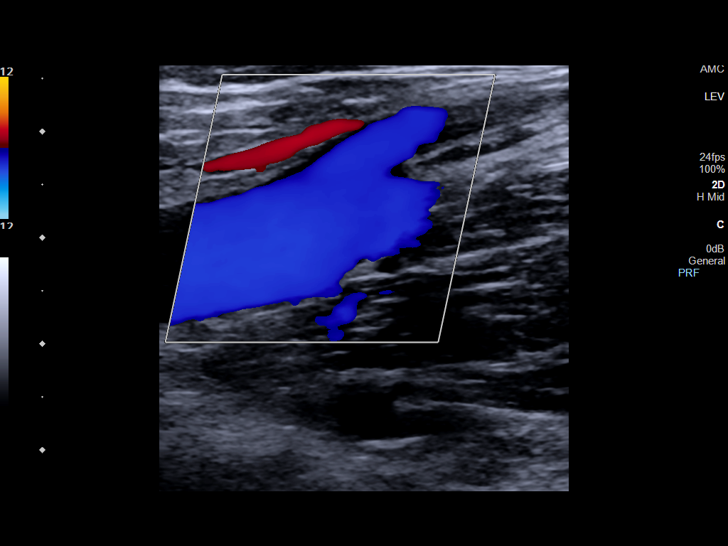
[im 7/27]
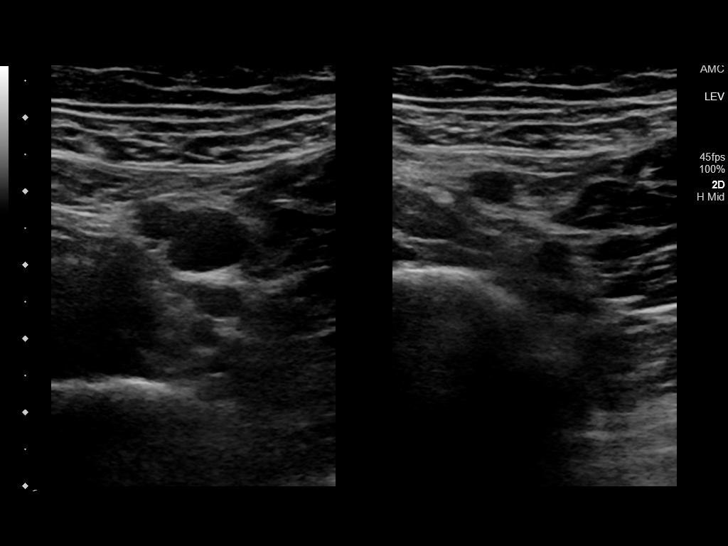
[im 8/27]
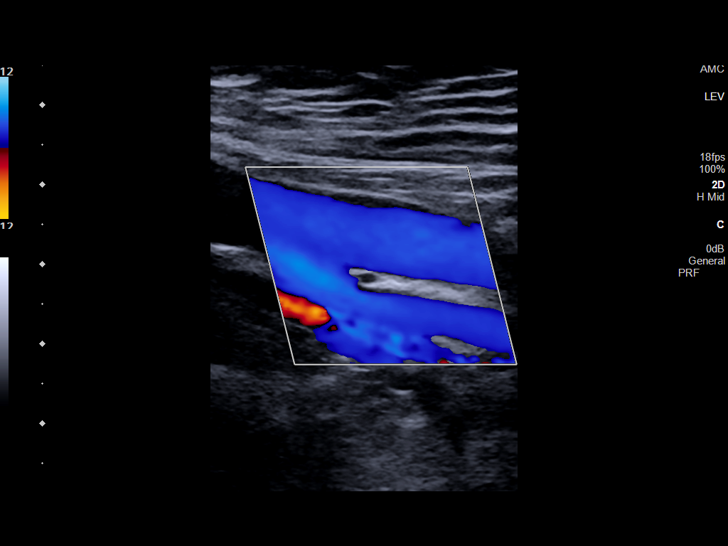
[im 11/27]
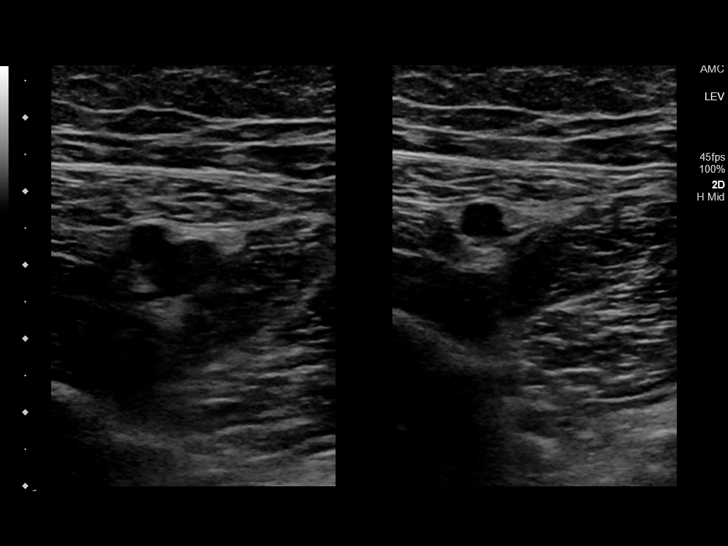
[im 13/27]
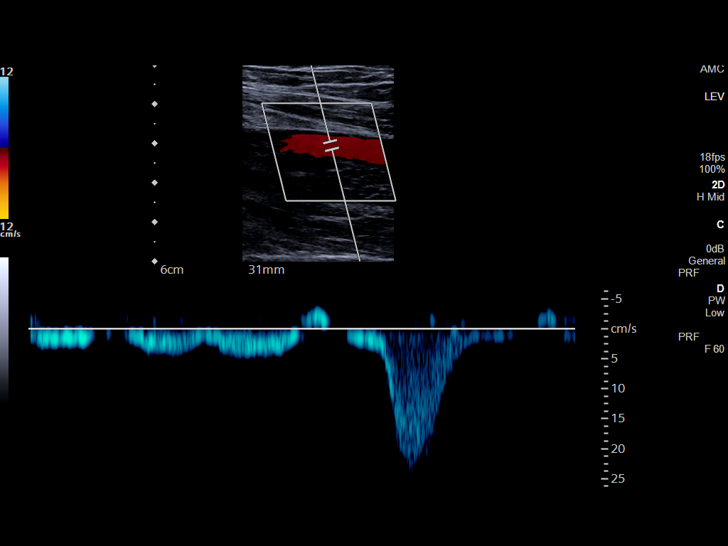
[im 14/27]
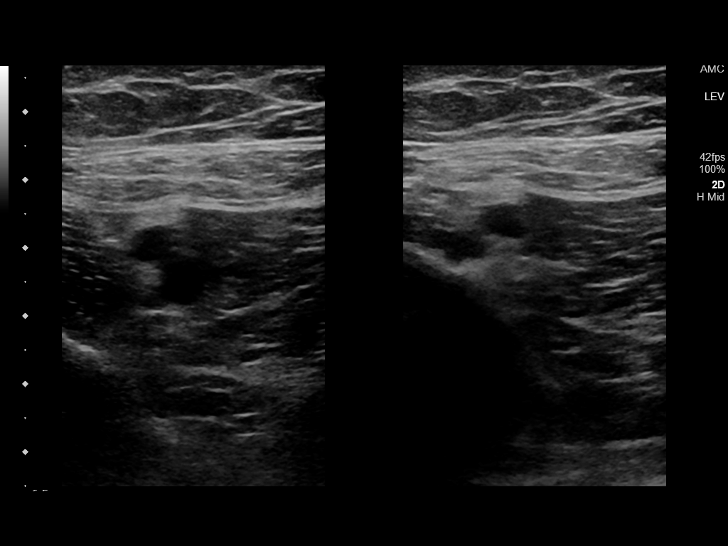
[im 16/27]
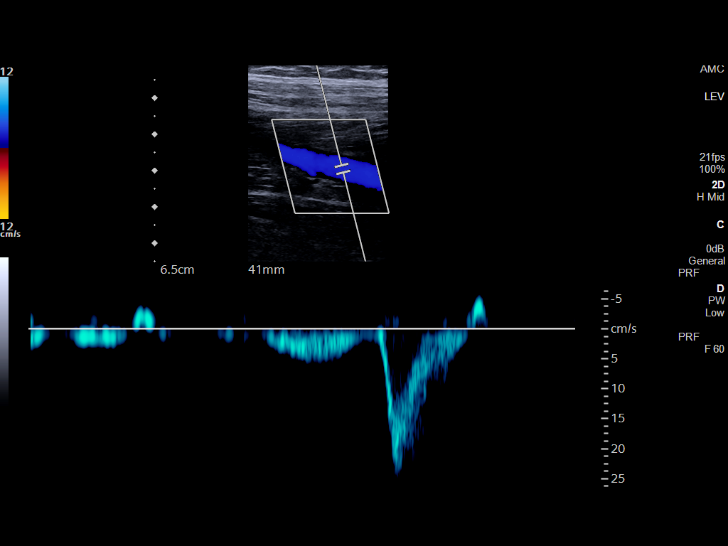
[im 19/27]
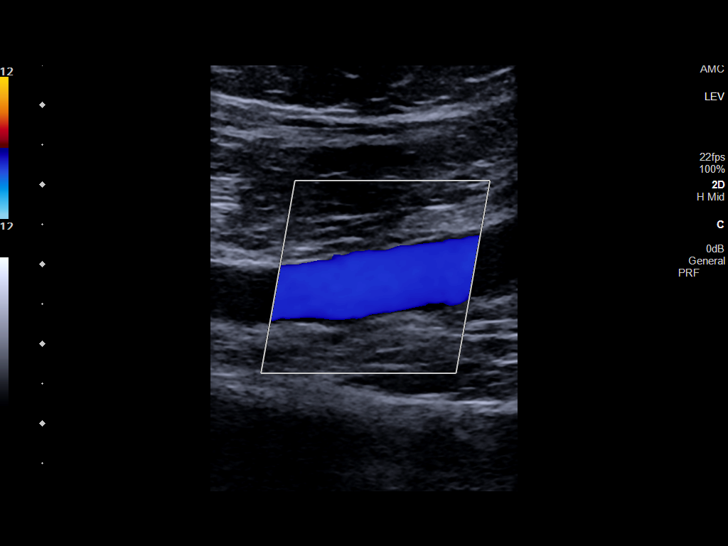
[im 21/27]
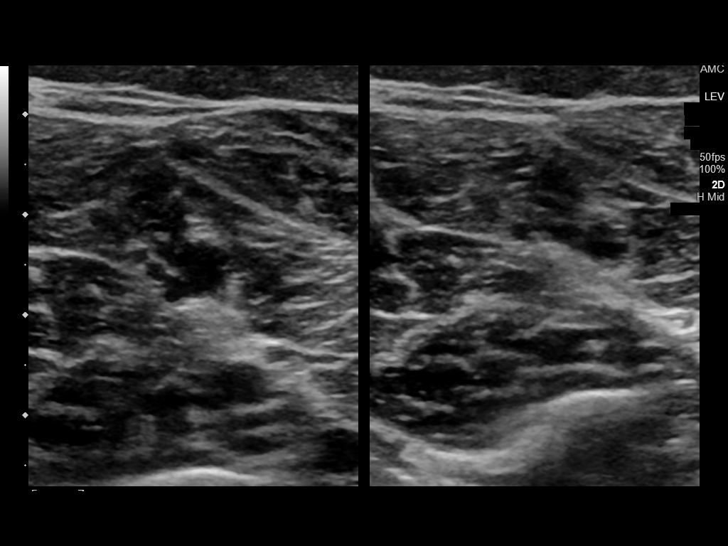
[im 22/27]
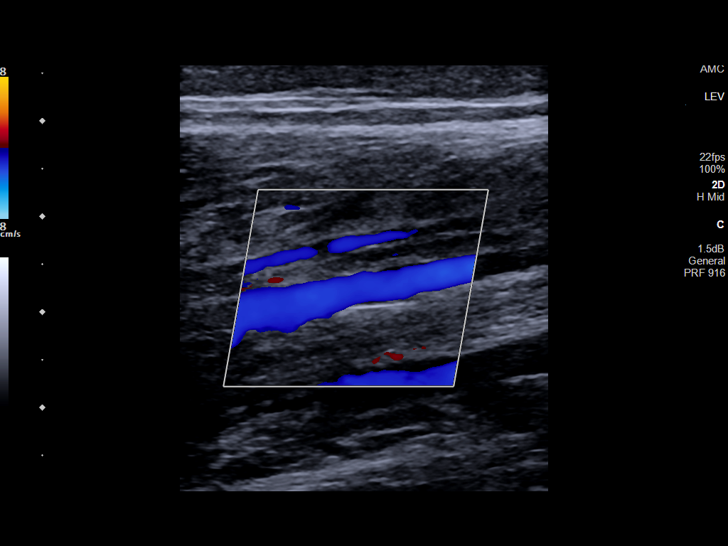
[im 24/27]
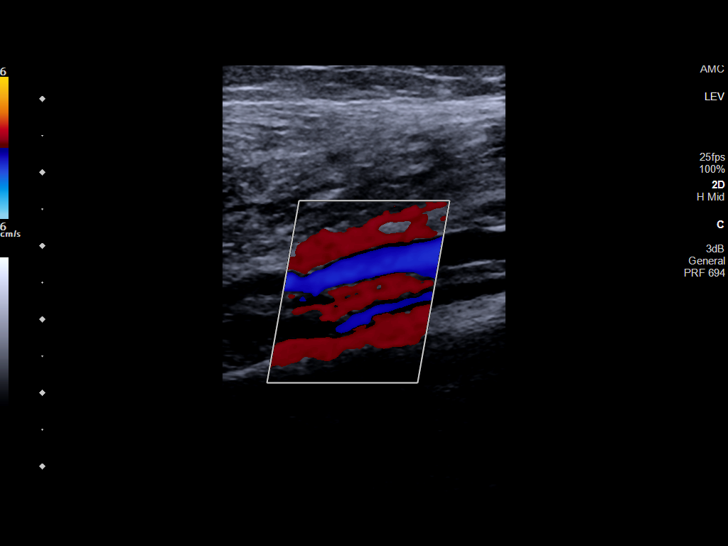
[im 27/27]
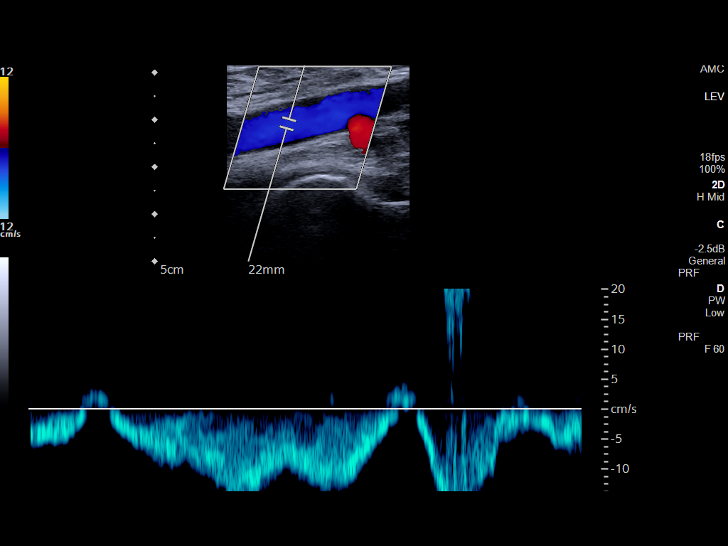

[14 of 24 positions shown; findings below may reference images not displayed]

FINDINGS: VENOUS

Normal compressibility of the common femoral, superficial femoral,
and popliteal veins, as well as the visualized calf veins.
Visualized portions of profunda femoral vein and great saphenous
vein unremarkable. No filling defects to suggest DVT on grayscale or
color Doppler imaging. Doppler waveforms show normal direction of
venous flow, normal respiratory plasticity and response to
augmentation.

Limited views of the contralateral common femoral vein are
unremarkable.

OTHER

None.

Limitations: none
IMPRESSION: Normal examination. No evidence of right deep venous thrombosis. No
other abnormality identified to explain pain.

## 2020-03-22 IMAGING — US US PELVIS COMPLETE WITH TRANSVAGINAL
2 series · 13 of 25 positions shown · non-contrast
Comparison: [DATE]

Correlation: CT abdomen and pelvis [DATE]

CLINICAL DATA: Follow-up complex cyst/corpus luteum RIGHT ovary



[Series 1: us pelvic complete with transvaginal · 12 of 110 slices shown]
[im 1/110]
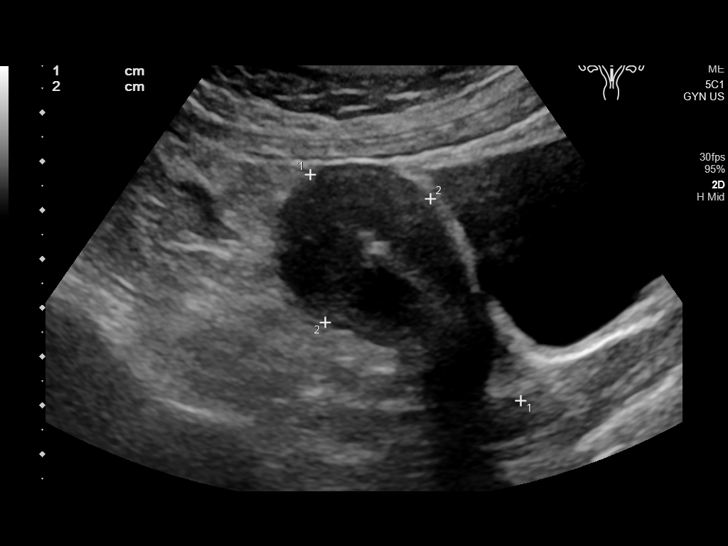
[im 10/110]
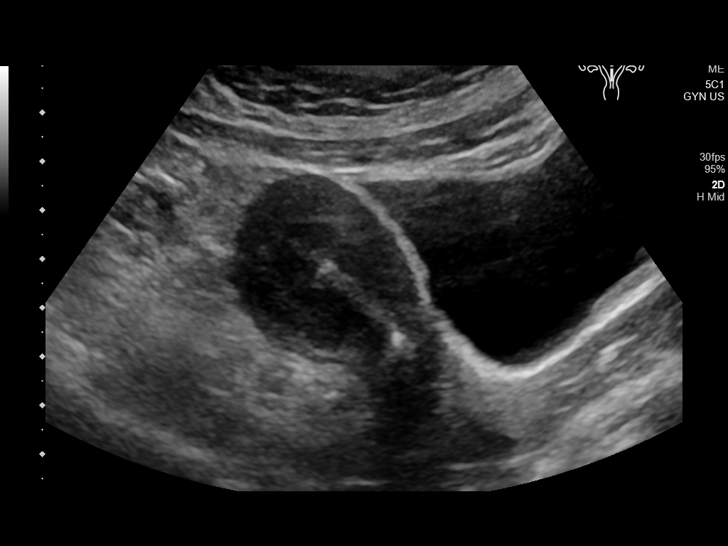
[im 19/110]
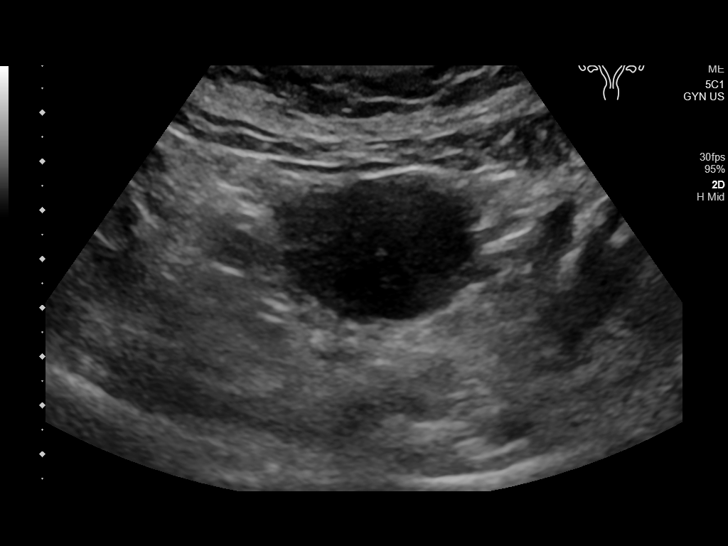
[im 29/110]
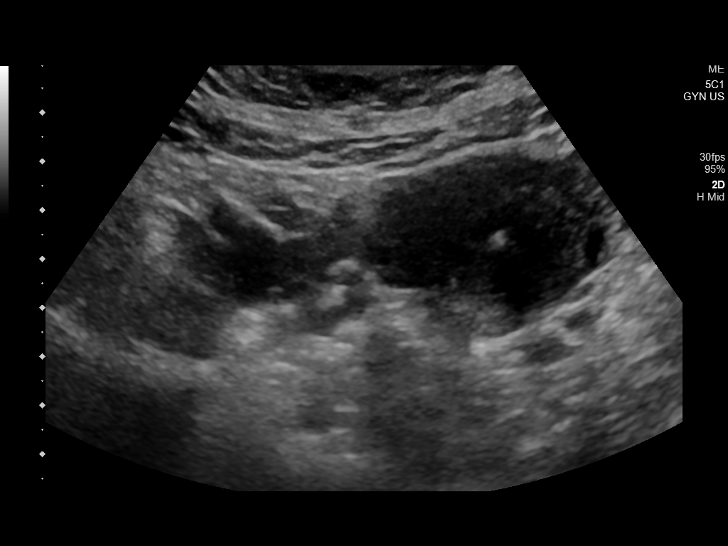
[im 38/110]
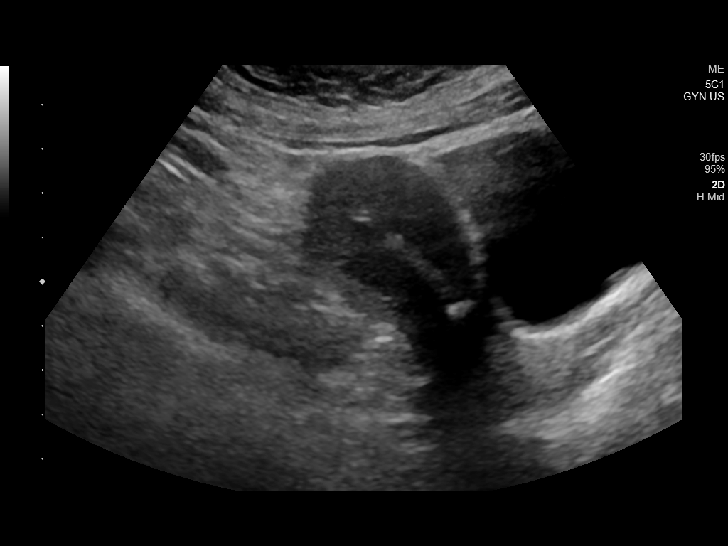
[im 48/110]
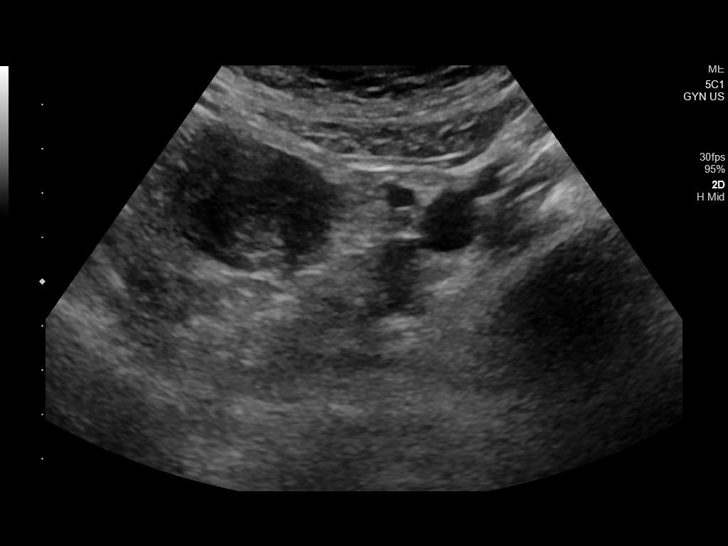
[im 57/110]
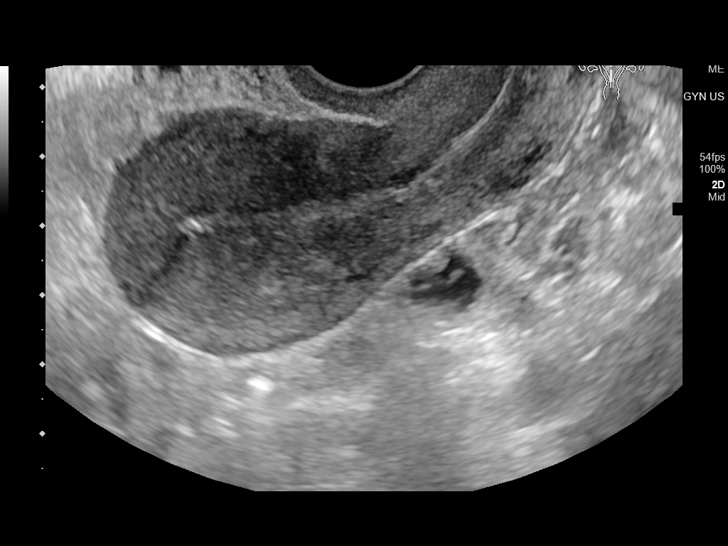
[im 67/110]
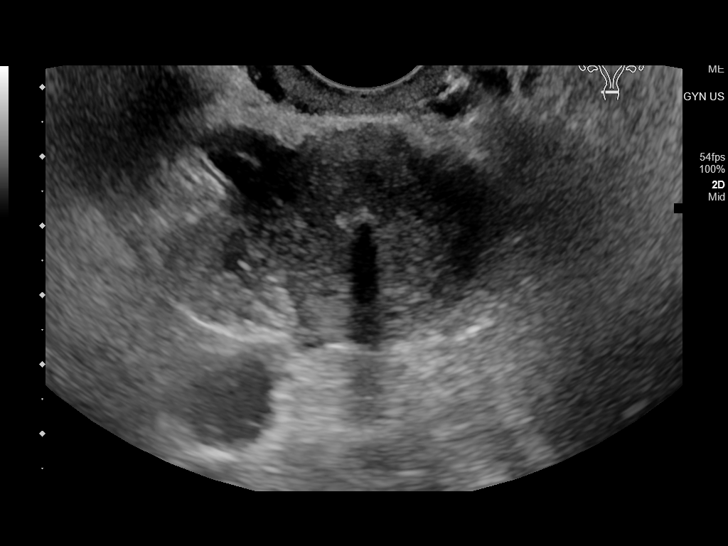
[im 76/110]
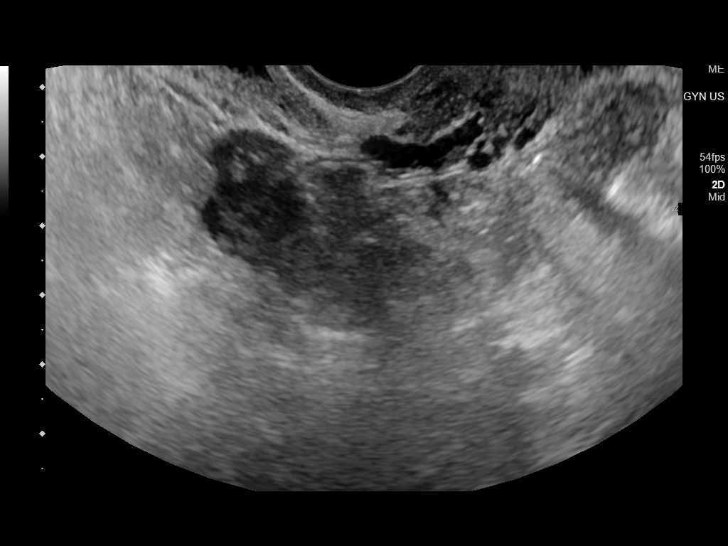
[im 86/110]
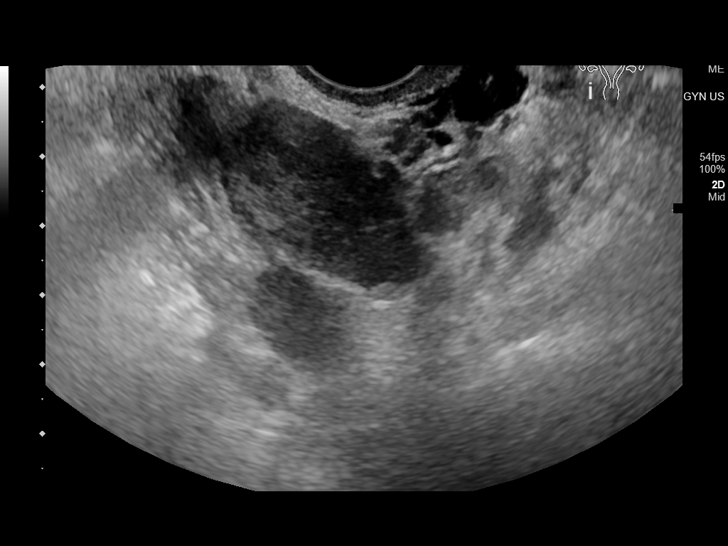
[im 95/110]
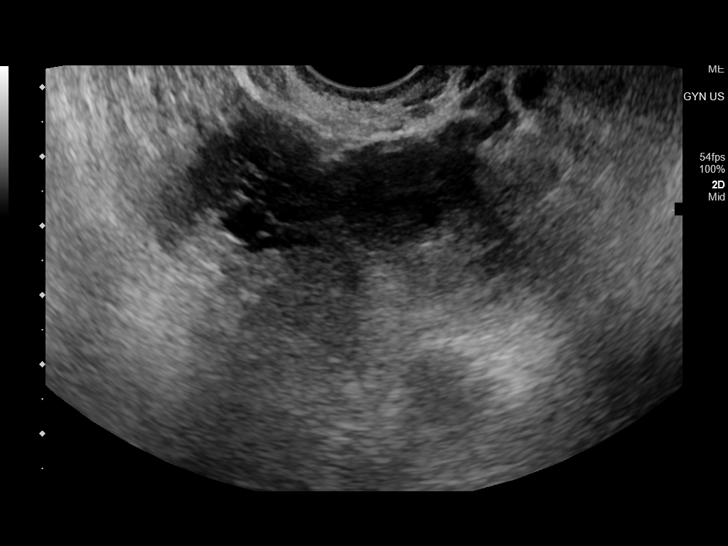
[im 105/110]
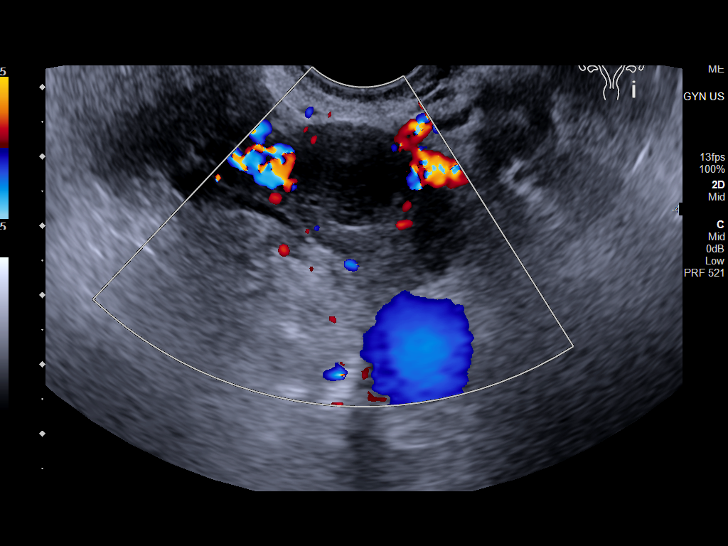

[Series 1001: gyn us · 1 of 2 slices shown]
[im 1/2]
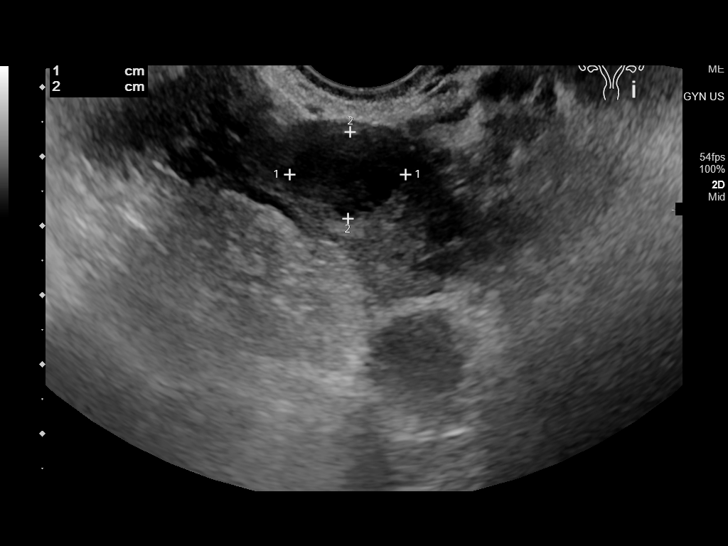

[13 of 25 positions shown; findings below may reference images not displayed]

FINDINGS: Uterus

Measurements: 6.4 x 3.6 x 4.7 cm = volume: 56 mL. Anteverted. Normal
morphology without mass

Endometrium

Thickness: 3 mm. IUD identified in fundal portion of endometrial
canal in expected position. No endometrial fluid.

Right ovary

Measurements: 3.4 x 1.9 x 1.6 cm = volume: 5.3 mL. Normal morphology
without mass. Resolution of complicated cyst/corpus luteum
previously seen within RIGHT ovary.

Left ovary

Measurements: 3.3 x 1.9 x 2.1 cm = volume: 6.9 mL. Small corpus
luteum without additional mass

Other findings

No free pelvic fluid.  No adnexal masses.
IMPRESSION: IUD in expected position at upper uterine endometrial canal.

Resolution of previously identified complicated cyst/corpus luteum
of the RIGHT ovary.

Small corpus luteum LEFT ovary without additional pelvic sonographic
abnormalities.

## 2020-03-22 NOTE — ED Provider Notes (Signed)
[facility] Emergency Department Provider Note  ____________________________________________  Time seen: Approximately 5:54 PM  I have reviewed the triage vital signs and the nursing notes.   HISTORY  Chief Complaint Leg Pain    HPI Isabel [name] is a 29 y.o. female that presents to the emergency department for evaluation of right posterior leg tingling today.  Patient states that symptoms feel primarily consistent with her chronic sciatica, but tingling extended into her foot today, which it does not usually do. She talked to her primary care about it and recommended that she have an ultrasound done.  Patient was already here today for a pelvis ultrasound today but states the primary care did not have the ultrasound ordered yet for her leg.  She then came to the emergency department since it was after hours and her ultrasound was not ordered yet.  Patient has been taking Tylenol for pain.  No bowel or bladder dysfunction or saddle anesthesias.  No trauma.  No weakness.  Patient has varicose veins on the right side that developed after she was pregnant with her second child.   Past Medical History:  Diagnosis Date  . Aberrant right subclavian artery   . Anxiety   . Dysmenorrhea   . Family history of breast cancer in mother   . Genetic testing 04/25/18   PALB2 analysis @ [facility] - Familial pathogenic PALB2 mutation detected  . Migraines   . 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 Anxiety 03/14/2020  . RUQ pain 03/14/2020  . Periumbilical abdominal pain 03/14/2020  . History of laparoscopic cholecystectomy 03/14/2020  . Right lower quadrant abdominal pain 03/14/2020  . Corpus luteum cyst of right ovary 03/14/2020  . Umbilical hernia without obstruction or gangrene 03/14/2020  . Sensation  of chest pressure 02/26/2020  . Shortness of breath 02/26/2020  . Genetic testing   . Monoallelic mutation of PALB2 gene   . Family history of breast cancer in mother   . 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 Positive urine drug screen 03/27/2016  . Tobacco user 02/26/2016  . Irregular periods/menstrual cycles 02/26/2016  . Neck pain 02/22/2012  . Epigastric abdominal pain 09/25/2008  . LOW BACK PAIN 08/28/2008  . VASOVAGAL SYNCOPE 08/28/2008    Past Surgical History:  Procedure Laterality Date  . CHOLECYSTECTOMY      Prior to Admission medications   Medication Sig Start Date End Date Taking? Authorizing Provider  gabapentin (NEURONTIN) 100 MG capsule Take 2 capsules (200 mg total) by mouth 2 (two) times daily. 03/20/20   Flinchum, Kelby Aline, FNP  levonorgestrel (MIRENA) 20 MCG/24HR IUD 1 each by Intrauterine route once.    [provider]  pantoprazole (PROTONIX) 40 MG tablet Take 1 tablet (40 mg total) by mouth daily before breakfast. 02/27/20   Karen Kitchens, NP  albuterol (VENTOLIN HFA) 108 (90 Base) MCG/ACT inhaler Inhale 2 puffs into the lungs every 4 (four) hours as needed for wheezing or shortness of breath. Patient not taking: Reported on 02/26/2020 02/20/20 02/27/20  Sharion Balloon, NP  famotidine (PEPCID) 20 MG tablet Take 1 tablet (20 mg total) by mouth 2 (two) times daily. 02/24/20 02/27/20  Lilia Pro., MD    Allergies Amoxicillin and Penicillins  Family History  Problem Relation Age of Onset  . Breast cancer Mother 32  currently 48  . Hypertension Father   . Arthritis Other   . Diabetes Other   . Cancer Other   . Diabetes Maternal Grandmother   . Diabetes Maternal Grandfather   . Diabetes Paternal Grandmother   . Diabetes Paternal Grandfather   . Breast cancer Maternal Aunt 70       currently 51; PALB2 mutation  . Colon cancer Neg Hx   . Heart disease Neg Hx     Social History Social History   Tobacco Use  . Smoking status: Current Every  Day Smoker    Packs/day: 0.25    Types: Cigarettes  . Smokeless tobacco: Never Used  Substance Use Topics  . Alcohol use: No  . Drug use: Yes    Types: Marijuana     Review of Systems  Constitutional: No fever/chills Respiratory: No SOB. Gastrointestinal: No nausea, no vomiting.  Musculoskeletal: Positive for leg pain. Skin: Negative for rash, abrasions, lacerations, ecchymosis. Neurological: Negative for headaches.  Positive for tingling.   ____________________________________________   PHYSICAL EXAM:  VITAL SIGNS: ED Triage Vitals  Enc Vitals Group     BP 03/22/20 1554 135/82     Pulse Rate 03/22/20 1554 88     Resp 03/22/20 1554 16     Temp 03/22/20 1554 98.4 F (36.9 C)     Temp Source 03/22/20 1554 Oral     SpO2 03/22/20 1554 100 %     Weight 03/22/20 1555 150 lb (68 kg)     Height 03/22/20 1555 '5\' 4"'  (1.626 m)     Head Circumference --      Peak Flow --      Pain Score 03/22/20 1555 10     Pain Loc --      Pain Edu? --      Excl. in [place]? --      Constitutional: Alert and oriented. Well appearing and in no acute distress. Eyes: Conjunctivae are normal. PERRL. EOMI. Head: Atraumatic. ENT:      Ears:      Nose: No congestion/rhinnorhea.      Mouth/Throat: Mucous membranes are moist.  Neck: No stridor.  Cardiovascular: Normal rate, regular rhythm.  Good peripheral circulation. Respiratory: Normal respiratory effort without tachypnea or retractions. Lungs CTAB. Good air entry to the bases with no decreased or absent breath sounds. Musculoskeletal: Full range of motion to all extremities. No gross deformities appreciated.  No tenderness to palpation to lumbar spine.  Strength equal in lower extremities bilaterally.  Normal gait. Neurologic:  Normal speech and language. No gross focal neurologic deficits are appreciated.  Skin:  Skin is warm, dry.  Varicose veins to right leg. Psychiatric: Mood and affect are normal. Speech and behavior are normal. Patient  exhibits appropriate insight and judgement.   ____________________________________________   LABS (all labs ordered are listed, but only abnormal results are displayed)  Labs Reviewed - No data to display ____________________________________________  EKG   ____________________________________________  RADIOLOGY Robinette Haines, personally viewed and evaluated these images (plain radiographs) as part of my medical decision making, as well as reviewing the written report by the radiologist.  US Venous Img Lower Unilateral Right  Result Date: 03/22/2020 CLINICAL DATA:  Right lower extremity pain. EXAM: Right LOWER EXTREMITY VENOUS DOPPLER ULTRASOUND TECHNIQUE: Gray-scale sonography with compression, as well as color and duplex ultrasound, were performed to evaluate the deep venous system(s) from the level of the common femoral vein through the popliteal and proximal calf veins. COMPARISON:  None. FINDINGS: VENOUS  Normal compressibility of the common femoral, superficial femoral, and popliteal veins, as well as the visualized calf veins. Visualized portions of profunda femoral vein and great saphenous vein unremarkable. No filling defects to suggest DVT on grayscale or color Doppler imaging. Doppler waveforms show normal direction of venous flow, normal respiratory plasticity and response to augmentation. Limited views of the contralateral common femoral vein are unremarkable. OTHER None. Limitations: none IMPRESSION: Normal examination. No evidence of right deep venous thrombosis. No other abnormality identified to explain pain. Electronically Signed   By: Nelson Chimes M.D.   On: 03/22/2020 16:30    ____________________________________________    PROCEDURES  Procedure(s) performed:    Procedures    Medications - No data to display   ____________________________________________   INITIAL IMPRESSION / ASSESSMENT AND PLAN / ED COURSE  Pertinent labs & imaging results that were  available during my care of the patient were reviewed by me and considered in my medical decision making (see chart for details).  Review of the [place] CSRS was performed in accordance of the [place] prior to dispensing any controlled drugs.   Patient presented to the emergency department today for right leg pain and tinging and requesting an ultrasound to rule out DVT.  Vital signs and exam are reassuring.  Symptoms are most consistent with her sciatic pain.  Ultrasound negative for DVT.  Patient does not wish to wait for discharge and left after receiving ultrasound results.  Patient is given ED precautions to return to the ED for any worsening or new symptoms.   Isabel [name] was evaluated in Emergency Department on 03/22/2020 for the symptoms described in the history of present illness. She was evaluated in the context of the global COVID-19 pandemic, which necessitated consideration that the patient might be at risk for infection with the SARS-CoV-2 virus that causes COVID-19. Institutional protocols and algorithms that pertain to the evaluation of patients at risk for COVID-19 are in a state of rapid change based on information released by regulatory bodies including the CDC and federal and state organizations. These policies and algorithms were followed during the patient's care in the ED.  ____________________________________________  FINAL CLINICAL IMPRESSION(S) / ED DIAGNOSES  Final diagnoses:  None      NEW MEDICATIONS STARTED DURING THIS VISIT:  ED Discharge Orders    None          This chart was dictated using voice recognition software/Dragon. Despite best efforts to proofread, errors can occur which can change the meaning. Any change was purely unintentional.    Laban Emperor, PA-C 03/22/20 2048    Arta Silence, MD 03/23/20 0020

## 2020-03-22 NOTE — ED Triage Notes (Signed)
Pt c/o right leg pain worse deep in thigh per pt.  Has IUD and is intermittent smoker.  No hx DVT, sent by PCP for r/o DVT.  Has large varicose veins to back of thigh per pt.  NAD.

## 2020-03-22 NOTE — ED Notes (Signed)
See triage note  Presents with posterior right leg pain  States pain woke her up around 3 am   Unable to go back to sleep  No swelling or injury

## 2020-03-26 ENCOUNTER — Other Ambulatory Visit: Payer: Self-pay

## 2020-03-26 ENCOUNTER — Ambulatory Visit
Admission: EM | Admit: 2020-03-26 | Discharge: 2020-03-26 | Disposition: A | Discharge status: Home / Self Care | Payer: Medicaid Other | Attending: Urgent Care | Admitting: Urgent Care

## 2020-03-26 ENCOUNTER — Encounter: Payer: Self-pay | Admitting: Adult Health

## 2020-03-26 ENCOUNTER — Encounter: Payer: Self-pay | Admitting: Emergency Medicine

## 2020-03-26 DIAGNOSIS — Z20822 Contact with and (suspected) exposure to covid-19: Secondary | ICD-10-CM

## 2020-03-26 DIAGNOSIS — H66001 Acute suppurative otitis media without spontaneous rupture of ear drum, right ear: Secondary | ICD-10-CM | POA: Diagnosis present

## 2020-03-26 DIAGNOSIS — F419 Anxiety disorder, unspecified: Secondary | ICD-10-CM | POA: Diagnosis present

## 2020-03-26 DIAGNOSIS — J019 Acute sinusitis, unspecified: Secondary | ICD-10-CM | POA: Insufficient documentation

## 2020-03-26 LAB — SARS CORONAVIRUS 2 (TAT 6-24 HRS): SARS Coronavirus 2: NEGATIVE

## 2020-03-26 MED ORDER — HYDROXYZINE HCL 25 MG PO TABS: 25.0000 mg | ORAL_TABLET | Freq: Three times a day (TID) | ORAL | 0 refills | Status: DC | PRN

## 2020-03-26 MED ORDER — CEFDINIR 300 MG PO CAPS: 300.0000 mg | ORAL_CAPSULE | Freq: Two times a day (BID) | ORAL | 0 refills | Status: DC

## 2020-03-26 MED ORDER — SERTRALINE HCL 25 MG PO TABS: 25.0000 mg | ORAL_TABLET | Freq: Every day | ORAL | 0 refills | Status: DC

## 2020-03-26 NOTE — ED Triage Notes (Signed)
Patient c/o fever as high as 100.3 x 3 days. Patient also reports nasal congestion and sinus headache x 2 days.

## 2020-03-26 NOTE — ED Provider Notes (Signed)
Isabel [name], Blanket   Name: Isabel [name] DOB: December 08, 1990 MRN: [id number] CSN: [id number] PCP: Doreen Beam, FNP  Arrival date and time:  03/26/20 1128  Chief Complaint:  Fever, Nasal Congestion, Facial Pain, and Anxiety  NOTE: Prior to seeing the patient today, I have reviewed the triage nursing documentation and vital signs. Clinical staff has updated patient's PMH/PSHx, current medication list, and drug allergies/intolerances to ensure comprehensive history available to assist in medical decision making.   History:   HPI: Isabel [name] is a 29 y.o. female who presents today with complaints of fatigue, pain being her RIGHT eye, paranasal sinus tenderness, congestion, LAD, PND, RIGHT otalgia, and generalized headaches that started approximately 3-4 days ago. Patient endorses fevers to a Tmax 100.3. She denies any cough, shortness of breath, or wheezing. She denies that she has experienced any nausea, vomiting, diarrhea, or abdominal pain. She is eating and drinking well. Patient denies any perceived alterations to her sense of taste or smell. Patient denies being in close contact with anyone known to be ill; no one else is her home has experienced a similar symptom constellation. She has not been tested for SARS-CoV-2 (novel coronavirus) in the past 14 days; unsure of date of last test. In efforts to conservatively manage her symptoms at home, the patient notes that she has used mycolytics and decongestant, which have not helped to improve her symptoms.    Patient also with complaints of significant anxiety. She is requesting lorazepam prescription citing that her mother and sister both use this medication with effectiveness. Patient seen here on 02/27/2020 at which time she was complaining of the same. At that time, patient had stopped taking her gabapentin due to confusion related to what medications could be taken on a concurrent basis. Additionally, patient was not using marijuana  as much as she had in the last; last used 01/23/2020 per her report. We discussed starting hydroxyzine, however patient wished to defer. In the interim, she has been seen by her PCP for various issues, one of which being her anxiety. Patient reports that she was started on escitalopram, however could not tolerate it citing that it made her be "unable to function".   Past Medical History:  Diagnosis Date  . Aberrant right subclavian artery   . Anxiety   . Dysmenorrhea   . Family history of breast cancer in mother   . Genetic testing 04/25/18   PALB2 analysis @ [facility] - Familial pathogenic PALB2 mutation detected  . Migraines   . 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 CHOLECYSTECTOMY      Family History  Problem Relation Age of Onset  . Breast cancer Mother 64       currently 52  . Hypertension Father   . Arthritis Other   . Diabetes Other   . Cancer Other   . Diabetes Maternal Grandmother   . Diabetes Maternal Grandfather   . Diabetes Paternal Grandmother   . Diabetes Paternal Grandfather   . Breast cancer Maternal Aunt 44       currently 35; PALB2 mutation  . Colon cancer Neg Hx   . Heart disease Neg Hx     Social History   Tobacco Use  . Smoking status: Current Every Day Smoker    Packs/day: 0.25    Types: Cigarettes  . Smokeless tobacco: Never Used  Substance Use Topics  . Alcohol  use: No  . Drug use: Yes    Types: Marijuana    Patient Active Problem List   Diagnosis Date Noted  . 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 Anxiety 03/14/2020  . RUQ pain 03/14/2020  . Periumbilical abdominal pain 03/14/2020  . History of laparoscopic cholecystectomy 03/14/2020  . Right lower quadrant abdominal pain 03/14/2020  . Corpus luteum cyst of right ovary 03/14/2020  . Umbilical hernia without obstruction or gangrene 03/14/2020  . Sensation of  chest pressure 02/26/2020  . Shortness of breath 02/26/2020  . Genetic testing   . Monoallelic mutation of PALB2 gene   . Family history of breast cancer in mother   . 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 Positive urine drug screen 03/27/2016  . Tobacco user 02/26/2016  . Irregular periods/menstrual cycles 02/26/2016  . Neck pain 02/22/2012  . Epigastric abdominal pain 09/25/2008  . LOW BACK PAIN 08/28/2008  . VASOVAGAL SYNCOPE 08/28/2008    Home Medications:    Current Meds  Medication Sig  . gabapentin (NEURONTIN) 100 MG capsule Take 2 capsules (200 mg total) by mouth 2 (two) times daily.  Marland Kitchen levonorgestrel (MIRENA) 20 MCG/24HR IUD 1 each by Intrauterine route once.  . pantoprazole (PROTONIX) 40 MG tablet Take 1 tablet (40 mg total) by mouth daily before breakfast.    Allergies:   Amoxicillin and Penicillins  Review of Systems (ROS):  Review of systems NEGATIVE unless otherwise noted in narrative H&P section.   Vital Signs: Today's Vitals   03/26/20 1215 03/26/20 1218 03/26/20 1250  BP:  111/77   Pulse:  97   Resp:  18   Temp:  98.5 F (36.9 C)   TempSrc:  Oral   SpO2:  100%   Weight: 150 lb (68 kg)    Height: '5\' 4"'  (1.626 m)    PainSc: 6   6     Physical Exam: Physical Exam  Constitutional: She is oriented to person, place, and time and well-developed, well-nourished, and in no distress.  HENT:  Head: Normocephalic and atraumatic.  Right Ear: There is tenderness. Tympanic membrane is erythematous and bulging (mild). A middle ear effusion is present.  Left Ear: Tympanic membrane is not injected, not erythematous and not bulging. A middle ear effusion (mild serous) is present.  Nose: Mucosal edema, rhinorrhea and sinus tenderness present.  Mouth/Throat: Uvula is midline and mucous membranes are normal. Posterior oropharyngeal erythema present. No oropharyngeal exudate or posterior oropharyngeal edema.  Eyes: Pupils are equal, round, and reactive to light.    Cardiovascular: Normal rate, regular rhythm, normal heart sounds and intact distal pulses.  Venous varicosities to BLE  Pulmonary/Chest: Effort normal and breath sounds normal.  Lymphadenopathy:       Head (right side): Submandibular adenopathy present.  Neurological: She is alert and oriented to person, place, and time. Gait normal.  Skin: Skin is warm and dry. No rash noted. She is not diaphoretic.  Psychiatric: Memory, affect and judgment normal. Her mood appears anxious.  Tearful in clinic when discussing health in general.   Nursing note and vitals reviewed.   Urgent Care Treatments / Results:   Orders Placed This Encounter  Procedures  . SARS CORONAVIRUS 2 (TAT 6-24 HRS) Nasopharyngeal Nasopharyngeal Swab    LABS: PLEASE NOTE: all labs that were ordered this encounter are listed, however only abnormal results are displayed. Labs Reviewed  SARS CORONAVIRUS 2 (TAT 6-24 HRS)    EKG: -None  RADIOLOGY: No results found.  PROCEDURES: Procedures  MEDICATIONS RECEIVED THIS VISIT: Medications - No data to display  PERTINENT CLINICAL COURSE NOTES/UPDATES:   Initial Impression / Assessment and Plan / Urgent Care Course:  Pertinent labs & imaging results that were available during my care of the patient were personally reviewed by me and considered in my medical decision making (see lab/imaging section of note for values and interpretations).  ASTORIA CONDON is a 29 y.o. female who presents to [facility] today with complaints of Fever, Nasal Congestion, Facial Pain, and Anxiety  Patient overall well appearing and in no acute distress today in clinic. Presenting symptoms (see HPI) and exam as documented above. She presents with symptoms associated with SARS-CoV-2 (novel coronavirus). Discussed typical symptom constellation. Reviewed potential for infection and need for testing. Patient amenable to being tested. SARS-CoV-2 swab collected by certified clinical staff.  Discussed variable turn around times associated with testing, as swabs are being processed at the main campus of [facility] in [place], and have been taking 12-24 hours to come back. She was advised to self quarantine, per [facility] DHHS guidelines, until negative results received. These measures are being implemented out of an abundance of caution to prevent transmission and spread during the current SARS-CoV-2 pandemic.  Exam consistent with sinusitis with recurrent AOME on the RIGHT.  Reviewed the potential for concurrent viral and bacterial infection. Discussed that until ruled out with confirmatory lab testing, SARS-CoV-2 remains part of the differential. Her testing is pending at this time. Patient is PCN allergic. Treating with a 10 day course of oral cefdinir. Reviewed supportive care measures; rest, increased hydration, and PRN use of APAP and/or IBU for discomfort/fever. Regarding her anxiety. We discussed that BZO therapy is not generally prescribed in the urgent care setting. Discussed alternatives. Patient recently tried SSRI (escitalopram). I reached out to PCP and learned that the patient only took a few doses of this medication prior to discontinuing therapy. She notes that she feels as if the dose was "too high" and causing her "not to be able to function". Discussed alternative SSRI (sertraline) with patient. Reviewed low dose therapy offers anxiolytic effects compared to those offered by BZOs. Patient amenable to starting on low dose sertraline; reviewed that it can take up to 4 weeks to become fully effective. In the interim, patient is also willing to trial hydroxyzine as a means of controlling her anxiety. In speaking with PCP today, I was advised that referral to psychiatry had been discussed. If these medications are unable to be tolerate, this patient would likely benefit from at least consulting with psychiatry to discuss healthy coping mechanisms aimed at reducing her anxiety.   Current  clinical condition warrants patient being out of work in order to quarantine while waiting for testing results. She was provided with the appropriate documentation to provide to her place of employment that will allow for her to RTW on 03/29/2020 with no restrictions. RTW is contingent on her SARS-CoV-2 test results being reviewed as negative.     Discussed follow up with primary care physician in 1 week for re-evaluation. I have reviewed the follow up and strict return precautions for any new or worsening symptoms. Patient is aware of symptoms that would be deemed urgent/emergent, and would thus require further evaluation either here or in the emergency department. At the time of discharge, she verbalized understanding and consent with the discharge plan as it was reviewed with her. All questions were fielded by provider and/or clinic staff prior to patient discharge.  Final Clinical Impressions / Urgent Care Diagnoses:   Final diagnoses:  Acute non-recurrent sinusitis, unspecified location  Non-recurrent acute suppurative otitis media of right ear without spontaneous rupture of tympanic membrane  Anxiety  Encounter for laboratory testing for COVID-19 virus    New Prescriptions:  [place] Controlled Substance Registry consulted? Not Applicable  Meds ordered this encounter  Medications  . cefdinir (OMNICEF) 300 MG capsule    Sig: Take 1 capsule (300 mg total) by mouth 2 (two) times daily for 10 days.    Dispense:  20 capsule    Refill:  0  . sertraline (ZOLOFT) 25 MG tablet    Sig: Take 1 tablet (25 mg total) by mouth daily.    Dispense:  30 tablet    Refill:  0  . hydrOXYzine (ATARAX/VISTARIL) 25 MG tablet    Sig: Take 1 tablet (25 mg total) by mouth every 8 (eight) hours as needed.    Dispense:  30 tablet    Refill:  0    Recommended Follow up Care:  Patient encouraged to follow up with the following provider within the specified time frame, or sooner as dictated by the severity of her  symptoms. As always, she was instructed that for any urgent/emergent care needs, she should seek care either here or in the emergency department for more immediate evaluation.  Follow-up Information    Flinchum, Kelby Aline, FNP In 1 week.   Specialty: Family Medicine Why: General reassessment of symptoms if not improving Contact information: [address] [place] [address] [phone]         NOTE: This note was prepared using Dragon dictation software along with smaller Company secretary. Despite my best ability to proofread, there is the potential that transcriptional errors may still occur from this process, and are completely unintentional.    Karen Kitchens, NP 03/26/20 1953

## 2020-03-26 NOTE — Discharge Instructions (Signed)
It was very nice seeing you today in clinic. Thank you for entrusting me with your care.   Rest and increase fluid intake. Use medications as prescribed.   Make arrangements to follow up with your regular doctor in 1 week for re-evaluation if not improving. If your symptoms/condition worsens, please seek follow up care either here or in the ER. Please remember, our [place] providers are "right here with you" when you need [place].   Again, it was my pleasure to take care of you today. Thank you for choosing our clinic. I hope that you start to feel better quickly.   Honor Loh, MSN, APRN, FNP-C, CEN Advanced Practice Provider [facility]

## 2020-03-28 ENCOUNTER — Encounter (INDEPENDENT_AMBULATORY_CARE_PROVIDER_SITE_OTHER): Payer: Self-pay | Admitting: Nurse Practitioner

## 2020-04-01 ENCOUNTER — Encounter (INDEPENDENT_AMBULATORY_CARE_PROVIDER_SITE_OTHER): Payer: Self-pay | Admitting: Vascular Surgery

## 2020-04-01 ENCOUNTER — Ambulatory Visit (INDEPENDENT_AMBULATORY_CARE_PROVIDER_SITE_OTHER): Payer: Self-pay | Admitting: Vascular Surgery

## 2020-04-01 ENCOUNTER — Other Ambulatory Visit: Payer: Self-pay

## 2020-04-01 VITALS — BP 105/70 | HR 99 | Resp 18 | Ht 64.0 in | Wt 143.0 lb

## 2020-04-01 DIAGNOSIS — Q278 Other specified congenital malformations of peripheral vascular system: Secondary | ICD-10-CM

## 2020-04-01 DIAGNOSIS — I83813 Varicose veins of bilateral lower extremities with pain: Secondary | ICD-10-CM

## 2020-04-05 NOTE — Progress Notes (Signed)
MRN : [id number]  Isabel [name] is a 29 y.o. ([birth year]) female who presents with chief complaint of  Chief Complaint  Patient presents with  . New Patient (Initial Visit)    right subclavian artery  .  History of Present Illness:  I am asked to evaluate the patient by Ms. Flinchum for incidental finding of aberrant origin of the right subclavian artery.  This anatomy was noted on a MRI obtained for neck pain radiating into the arms associated with headache.  Subsequently an MRA was obtained which clearly documents the right subclavian originates from the aortic arch directly.  The patient denies any right arm or right hand symptoms.  There is no history of splinter hemorrhages or ulcerations.  There is no history of claudication of the right arm.  The patient is also c/o of symptomatic varicose veins right leg. The patient relates burning and stinging which worsened steadily throughout the course of the day, particularly with standing. The patient also notes an aching and throbbing pain over the varicosities, particularly with prolonged dependent positions. The symptoms are significantly improved with elevation.  The patient also notes that during hot weather the symptoms are greatly intensified. The patient states the pain from the varicose veins interferes with work, daily exercise, shopping and household maintenance. At this point, the symptoms are persistent and severe enough that they're having a negative impact on lifestyle and are interfering with daily activities.  There is no history of DVT, PE or superficial thrombophlebitis. There is no history of ulceration or hemorrhage. The patient denies a significant family history of varicose veins.  The patient has not worn graduated compression in the past. At the present time the patient has not been using over-the-counter analgesics. There is no history of prior surgical intervention or sclerotherapy.    Current Meds  Medication  Sig  . cefdinir (OMNICEF) 300 MG capsule Take 1 capsule (300 mg total) by mouth 2 (two) times daily for 10 days.  Marland Kitchen gabapentin (NEURONTIN) 100 MG capsule Take 2 capsules (200 mg total) by mouth 2 (two) times daily.  . hydrOXYzine (ATARAX/VISTARIL) 25 MG tablet Take 1 tablet (25 mg total) by mouth every 8 (eight) hours as needed.  Marland Kitchen levonorgestrel (MIRENA) 20 MCG/24HR IUD 1 each by Intrauterine route once.  . pantoprazole (PROTONIX) 40 MG tablet Take 1 tablet (40 mg total) by mouth daily before breakfast.  . sertraline (ZOLOFT) 25 MG tablet Take 1 tablet (25 mg total) by mouth daily.    Past Medical History:  Diagnosis Date  . Aberrant right subclavian artery   . Anxiety   . Dysmenorrhea   . Family history of breast cancer in mother   . Genetic testing 04/25/18   PALB2 analysis @ [facility] - Familial pathogenic PALB2 mutation detected  . Migraines   . 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 CHOLECYSTECTOMY      Social History Social History   Tobacco Use  . Smoking status: Current Every Day Smoker    Packs/day: 0.25    Types: Cigarettes  . Smokeless tobacco: Never Used  Substance Use Topics  . Alcohol use: No  . Drug use: Yes    Types: Marijuana    Family History Family History  Problem Relation Age of Onset  . Breast cancer Mother 76       currently 58  . Hypertension Father   .  Arthritis Other   . Diabetes Other   . Cancer Other   . Diabetes Maternal Grandmother   . Diabetes Maternal Grandfather   . Diabetes Paternal Grandmother   . Diabetes Paternal Grandfather   . Breast cancer Maternal Aunt 29       currently 24; PALB2 mutation  . Colon cancer Neg Hx   . Heart disease Neg Hx   No family history of bleeding/clotting disorders, porphyria or autoimmune disease   Allergies  Allergen Reactions  . Amoxicillin Hives  . Penicillins Hives      REVIEW OF SYSTEMS (Negative unless checked)  Constitutional: '[]' Weight loss  '[]' Fever  '[]' Chills Cardiac: '[]' Chest pain   '[]' Chest pressure   '[]' Palpitations   '[]' Shortness of breath when laying flat   '[]' Shortness of breath with exertion. Vascular:  '[]' Pain in legs with walking   '[x]' Pain in legs at rest  '[]' History of DVT   '[]' Phlebitis   '[]' Swelling in legs   '[x]' Varicose veins   '[]' Non-healing ulcers Pulmonary:   '[]' Uses home oxygen   '[]' Productive cough   '[]' Hemoptysis   '[]' Wheeze  '[]' COPD   '[]' Asthma Neurologic:  '[]' Dizziness   '[]' Seizures   '[]' History of stroke   '[]' History of TIA  '[]' Aphasia   '[]' Vissual changes   '[x]' Weakness or numbness in arm   '[]' Weakness or numbness in leg Musculoskeletal:   '[]' Joint swelling   '[]' Joint pain   '[]' Low back pain Hematologic:  '[]' Easy bruising  '[]' Easy bleeding   '[]' Hypercoagulable state   '[]' Anemic Gastrointestinal:  '[]' Diarrhea   '[]' Vomiting  '[]' Gastroesophageal reflux/heartburn   '[]' Difficulty swallowing. Genitourinary:  '[]' Chronic kidney disease   '[]' Difficult urination  '[]' Frequent urination   '[]' Blood in urine Skin:  '[]' Rashes   '[]' Ulcers  Psychological:  '[]' History of anxiety   '[]'  History of major depression.  Physical Examination  Vitals:   04/01/20 1301  BP: 105/70  Pulse: 99  Resp: 18  Weight: 143 lb (64.9 kg)  Height: '5\' 4"'  (1.626 m)   Body mass index is 24.55 kg/m. Gen: WD/WN, NAD Head: [place]/AT, No temporalis wasting.  Ear/Nose/Throat: Hearing grossly intact, nares w/o erythema or drainage, poor dentition Eyes: PER, EOMI, sclera nonicteric.  Neck: Supple, no masses.  No bruit or JVD.  Pulmonary:  Good air movement, clear to auscultation bilaterally, no use of accessory muscles.  Cardiac: RRR, normal S1, S2, no Murmurs. Vascular: The patient has large varicosities of the right lower extremity, measuring 5 to 8 mm in diameter, with mild venous stasis changes.  These varicosities appear to be more posterior medial.  There is a denser cluster in the ankle area as well.   There is trace edema.   Vessel Right Left  Radial Palpable Palpable  PT Palpable Palpable  DP Palpable Palpable  Gastrointestinal: soft, non-distended. No guarding/no peritoneal signs.  Musculoskeletal: M/S 5/5 throughout.  No deformity or atrophy.  Neurologic: CN 2-12 intact. Pain and light touch intact in extremities.  Symmetrical.  Speech is fluent. Motor exam as listed above. Psychiatric: Judgment intact, Mood & affect appropriate for pt's clinical situation. Dermatologic: No rashes or ulcers noted.  No changes consistent with cellulitis.   CBC Lab Results  Component Value Date   WBC 10.2 02/26/2020   HGB 15.5 (H) 02/26/2020   HCT 43.5 02/26/2020   MCV 90.2 02/26/2020   PLT 231 02/26/2020    BMET    Component Value Date/Time   NA 138 02/26/2020 1333   NA 141 07/19/2019 1104   NA 139 12/02/2014 1042   K 4.4 02/26/2020  1333   K 4.1 12/02/2014 1042   CL 104 02/26/2020 1333   CL 106 12/02/2014 1042   CO2 22 02/26/2020 1333   CO2 27 12/02/2014 1042   GLUCOSE 92 02/26/2020 1333   GLUCOSE 109 (H) 12/02/2014 1042   BUN 13 02/26/2020 1333   BUN 9 07/19/2019 1104   BUN 7 12/02/2014 1042   CREATININE 0.80 02/26/2020 1333   CREATININE 0.89 12/02/2014 1042   CALCIUM 9.3 02/26/2020 1333   CALCIUM 8.8 12/02/2014 1042   GFRNONAA >60 02/26/2020 1333   GFRNONAA >60 12/02/2014 1042   GFRNONAA >60 07/18/2014 0756   GFRAA >60 02/26/2020 1333   GFRAA >60 12/02/2014 1042   GFRAA >60 07/18/2014 0756   CrCl cannot be calculated (Patient's most recent lab result is older than the maximum 21 days allowed.).  COAG No results found for: INR, PROTIME  Radiology MR Angiogram Neck W or Wo Contrast  Result Date: 03/17/2020 CLINICAL DATA:  Initial evaluation for neck trauma, focal neural deficit. EXAM: MRA NECK WITHOUT AND WITH CONTRAST TECHNIQUE: Multiplanar and multiecho pulse sequences of the neck were obtained without and with intravenous contrast. Angiographic images of the neck were  obtained using MRA technique without and with intravenous contrast. CONTRAST:  7.75m GADAVIST GADOBUTROL 1 MMOL/ML IV SOLN COMPARISON:  None. FINDINGS: AORTIC ARCH: Visualized aortic arch of normal caliber. Incidental note made of an aberrant right subclavian artery. No hemodynamically significant stenosis or other vascular abnormality about the origin of the great vessels. RIGHT CAROTID SYSTEM: Right common and internal carotid arteries widely patent without stenosis, dissection or occlusion. LEFT CAROTID SYSTEM: Left common and internal carotid arteries widely patent without stenosis, dissection, or occlusion. VERTEBRAL ARTERIES: Both vertebral arteries arise from the subclavian arteries. Left vertebral artery dominant. Vertebral arteries widely patent without stenosis, dissection or occlusion. IMPRESSION: 1. Normal MRA of the neck. No evidence for dissection or other acute vascular abnormality. 2. Incidental aberrant right subclavian artery. Electronically Signed   By: BJeannine BogaM.D.   On: 03/17/2020 23:06   MR Cervical Spine Wo Contrast  Result Date: 03/17/2020 CLINICAL DATA:  Initial evaluation for acute neck pain, right arm burning and weakness. EXAM: MRI CERVICAL SPINE WITHOUT CONTRAST TECHNIQUE: Multiplanar, multisequence MR imaging of the cervical spine was performed. No intravenous contrast was administered. COMPARISON:  Prior radiograph from 01/31/2012. FINDINGS: Alignment: Straightening of the normal cervical lordosis. No listhesis or subluxation. Vertebrae: Vertebral body height maintained without evidence for acute or chronic fracture. Bone marrow signal intensity somewhat diffusely decreased on T1 weighted imaging, nonspecific, but most commonly related to anemia, smoking, or obesity. Subcentimeter benign hemangioma noted within the C6 vertebral body. No other discrete or worrisome osseous lesions. No abnormal marrow edema. Cord: Signal intensity within the cervical spinal cord is  normal. Normal cord caliber morphology. Posterior Fossa, vertebral arteries, paraspinal tissues: Incidental note made of an empty sella. Visualized brain and posterior fossa otherwise unremarkable. Craniocervical junction within normal limits. Paraspinous and prevertebral soft tissues normal. Normal flow voids seen within the vertebral arteries bilaterally. Disc levels: No significant disc pathology seen within the cervical spine. No disc bulge or focal disc herniation. No significant canal or foraminal stenosis. No neural impingement. IMPRESSION: 1. Negative MRI of the cervical spine. No acute abnormality or findings to explain patient's symptoms identified. 2. Decreased T1 signal intensity within the visualized bone marrow, nonspecific, but most commonly related to anemia, smoking, or obesity. Correlation with history and laboratory values suggested. 3. Empty sella. While this finding is  often incidental in nature and of no clinical significance, this can also be seen in the setting of idiopathic intracranial hypertension. Electronically Signed   By: Jeannine Boga M.D.   On: 03/17/2020 23:00   CT Abdomen Pelvis W Contrast  Result Date: 03/14/2020 CLINICAL DATA:  Abdominal pain, unspecified RIGHT UPPER QUADRANT and RIGHT LOWER QUADRANT pain. Small RIGHT-sided umbilical hernia. Rule out appendicitis. History of cholecystectomy. EXAM: CT ABDOMEN AND PELVIS WITH CONTRAST TECHNIQUE: Multidetector CT imaging of the abdomen and pelvis was performed using the standard protocol following bolus administration of intravenous contrast. CONTRAST:  174m OMNIPAQUE IOHEXOL 300 MG/ML  SOLN COMPARISON:  CT of the abdomen and pelvis on 12/02/2014 FINDINGS: Lower chest: No acute abnormality. Hepatobiliary: Cholecystectomy. Liver is normal in appearance. Pancreas: Unremarkable. No pancreatic ductal dilatation or surrounding inflammatory changes. Spleen: Normal in size without focal abnormality. Adrenals/Urinary Tract:  Normal adrenal glands. Symmetric enhancement of both kidneys. No renal mass. Ureters are unremarkable. The bladder and visualized portion of the urethra are normal. Stomach/Bowel: Stomach and small bowel loops are normal in appearance. The appendix is well seen and has a normal appearance. Colon is unremarkable. Vascular/Lymphatic: There is normal vascular opacification of the celiac axis, superior mesenteric artery, and inferior mesenteric artery. Normal appearance of the portal venous system and inferior vena cava. No retroperitoneal or mesenteric adenopathy. Reproductive: Uterus is present and contains intrauterine device in expected central portion of the uterus. Enhancing RIGHT corpus luteum cyst is 1.8 centimeters on image 79 of series 2. LEFT ovarian follicle is 1.7 centimeters on image 71 of series 2. Ovaries do not appear enlarged. There is no free pelvic fluid. Other: Small fat containing paraumbilical hernia. Musculoskeletal: No acute or significant osseous findings. IMPRESSION: 1. RIGHT ovarian corpus luteum cyst measuring 1.8 centimeters, a possible source of the patient's pain. Consider follow-up pelvic ultrasound in 6-8 weeks. 2. Normal appendix. 3. Cholecystectomy. 4. Small fat containing paraumbilical hernia. Electronically Signed   By: ENolon NationsM.D.   On: 03/14/2020 16:14   [place] Img Lower Unilateral Right  Result Date: 03/22/2020 CLINICAL DATA:  Right lower extremity pain. EXAM: Right LOWER EXTREMITY VENOUS DOPPLER ULTRASOUND TECHNIQUE: Gray-scale sonography with compression, as well as color and duplex ultrasound, were performed to evaluate the deep venous system(s) from the level of the common femoral vein through the popliteal and proximal calf veins. COMPARISON:  None. FINDINGS: VENOUS Normal compressibility of the common femoral, superficial femoral, and popliteal veins, as well as the visualized calf veins. Visualized portions of profunda femoral vein and great saphenous vein  unremarkable. No filling defects to suggest DVT on grayscale or color Doppler imaging. Doppler waveforms show normal direction of venous flow, normal respiratory plasticity and response to augmentation. Limited views of the contralateral common femoral vein are unremarkable. OTHER None. Limitations: none IMPRESSION: Normal examination. No evidence of right deep venous thrombosis. No other abnormality identified to explain pain. Electronically Signed   By: MNelson ChimesM.D.   On: 03/22/2020 16:30   [place]elvic Complete With Transvaginal  Result Date: 03/22/2020 CLINICAL DATA:  Follow-up complex cyst/corpus luteum RIGHT ovary EXAM: TRANSABDOMINAL AND TRANSVAGINAL ULTRASOUND OF PELVIS TECHNIQUE: Both transabdominal and transvaginal ultrasound examinations of the pelvis were performed. Transabdominal technique was performed for global imaging of the pelvis including uterus, ovaries, adnexal regions, and pelvic cul-de-sac. It was necessary to proceed with endovaginal exam following the transabdominal exam to visualize the ovaries. COMPARISON:  10/12/2018 Correlation: CT abdomen and pelvis 03/14/2020 FINDINGS: Uterus Measurements: 6.4 x 3.6  x 4.7 cm = volume: 56 mL. Anteverted. Normal morphology without mass Endometrium Thickness: 3 mm. IUD identified in fundal portion of endometrial canal in expected position. No endometrial fluid. Right ovary Measurements: 3.4 x 1.9 x 1.6 cm = volume: 5.3 mL. Normal morphology without mass. Resolution of complicated cyst/corpus luteum previously seen within RIGHT ovary. Left ovary Measurements: 3.3 x 1.9 x 2.1 cm = volume: 6.9 mL. Small corpus luteum without additional mass Other findings No free pelvic fluid.  No adnexal masses. IMPRESSION: IUD in expected position at upper uterine endometrial canal. Resolution of previously identified complicated cyst/corpus luteum of the RIGHT ovary. Small corpus luteum LEFT ovary without additional pelvic sonographic abnormalities. Electronically  Signed   By: Lavonia Dana M.D.   On: 03/22/2020 18:06     Assessment/Plan 1. Aberrant right subclavian artery This is a common variant of the arch anatomy and she is asymptomatic.  Therefore no intervention is needed at this time  2. Varicose veins of bilateral lower extremities with pain  Recommend:  The patient has large symptomatic varicose veins that are painful and associated with swelling.  I have had a long discussion with the patient regarding  varicose veins and why they cause symptoms.  Patient will begin wearing graduated compression stockings class 1 on a daily basis, beginning first thing in the morning and removing them in the evening. The patient is instructed specifically not to sleep in the stockings.    The patient  will also begin using over-the-counter analgesics such as Motrin 600 mg po TID to help control the symptoms.    In addition, behavioral modification including elevation during the day will be initiated.    Pending the results of these changes the  patient will be reevaluated in three months.   An  ultrasound of the venous system will be obtained.   Further plans will be based on the ultrasound results and whether conservative therapies are successful at eliminating the pain and swelling.  Recommend:  The patient has large symptomatic varicose veins that are painful and associated with swelling.  I have had a long discussion with the patient regarding  varicose veins and why they cause symptoms.  Patient will begin wearing graduated compression stockings class 1 on a daily basis, beginning first thing in the morning and removing them in the evening. The patient is instructed specifically not to sleep in the stockings.    The patient  will also begin using over-the-counter analgesics such as Motrin 600 mg po TID to help control the symptoms.    In addition, behavioral modification including elevation during the day will be initiated.    Pending the results of  these changes the  patient will be reevaluated in three months.   An  ultrasound of the venous system will be obtained.   Further plans will be based on the ultrasound results and whether conservative therapies are successful at eliminating the pain and swelling.   - VAS [place] LOWER EXTREMITY VENOUS REFLUX; Future   Hortencia Pilar, MD  04/05/2020 1:57 PM

## 2020-04-06 ENCOUNTER — Encounter (INDEPENDENT_AMBULATORY_CARE_PROVIDER_SITE_OTHER): Payer: Self-pay | Admitting: Vascular Surgery

## 2020-04-08 ENCOUNTER — Other Ambulatory Visit: Payer: Self-pay

## 2020-04-08 ENCOUNTER — Ambulatory Visit
Admission: EM | Admit: 2020-04-08 | Discharge: 2020-04-08 | Disposition: A | Discharge status: Home / Self Care | Payer: Medicaid Other | Attending: Family Medicine | Admitting: Family Medicine

## 2020-04-08 DIAGNOSIS — B9689 Other specified bacterial agents as the cause of diseases classified elsewhere: Secondary | ICD-10-CM | POA: Insufficient documentation

## 2020-04-08 DIAGNOSIS — H9203 Otalgia, bilateral: Secondary | ICD-10-CM | POA: Insufficient documentation

## 2020-04-08 DIAGNOSIS — N760 Acute vaginitis: Secondary | ICD-10-CM | POA: Insufficient documentation

## 2020-04-08 LAB — URINALYSIS, COMPLETE (UACMP) WITH MICROSCOPIC
Bacteria, UA: NONE SEEN
Bilirubin Urine: NEGATIVE
Glucose, UA: NEGATIVE mg/dL
Ketones, ur: NEGATIVE mg/dL
Leukocytes,Ua: NEGATIVE
Nitrite: NEGATIVE
Protein, ur: NEGATIVE mg/dL
Specific Gravity, Urine: 1.015 (ref 1.005–1.030)
WBC, UA: NONE SEEN WBC/hpf (ref 0–5)
pH: 7 (ref 5.0–8.0)

## 2020-04-08 LAB — WET PREP, GENITAL
Sperm: NONE SEEN
Trich, Wet Prep: NONE SEEN
Yeast Wet Prep HPF POC: NONE SEEN

## 2020-04-08 MED ORDER — METRONIDAZOLE 500 MG PO TABS
500.0000 mg | ORAL_TABLET | Freq: Two times a day (BID) | ORAL | 0 refills | Status: DC
Start: 2020-04-08 — End: 2020-06-06

## 2020-04-08 NOTE — ED Triage Notes (Signed)
Patient complains of bilateral ear pain that started 2-3 days ago but worsened yesterday.   Patient states that she is also having burning with urination and vaginal discharge started after finishing Cefdinir.

## 2020-04-08 NOTE — ED Provider Notes (Signed)
[facility]    CSN: [id number] Arrival date & time: 04/08/20  0904      History   Chief Complaint Chief Complaint  Patient presents with  . Otalgia  . Dysuria    HPI Isabel [name] is a 29 y.o. female.   29 yo female with a c/o bilateral ear pain for the past 2-3 days. States she has some allergies with nasal congestion but is not taking any medications for this.  Also c/o burning with urination and vaginal discharge. Denies any fevers, chills, pelvic pains. Denies pregnancy.    Otalgia Dysuria   Past Medical History:  Diagnosis Date  . Aberrant right subclavian artery   . Anxiety   . Dysmenorrhea   . Family history of breast cancer in mother   . Genetic testing 04/25/18   PALB2 analysis @ [facility] - Familial pathogenic PALB2 mutation detected  . Migraines   . 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 Anxiety 03/14/2020  . RUQ pain 03/14/2020  . Periumbilical abdominal pain 03/14/2020  . History of laparoscopic cholecystectomy 03/14/2020  . Right lower quadrant abdominal pain 03/14/2020  . Corpus luteum cyst of right ovary 03/14/2020  . Umbilical hernia without obstruction or gangrene 03/14/2020  . Sensation of chest pressure 02/26/2020  . Shortness of breath 02/26/2020  . Genetic testing   . Monoallelic mutation of PALB2 gene   . Family history of breast cancer in mother   . 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/24/2016  . Positive urine drug screen 03/27/2016  . Tobacco user 02/26/2016  . Irregular periods/menstrual cycles 02/26/2016  . Neck pain 02/22/2012  . Epigastric abdominal pain 09/25/2008  . LOW BACK PAIN 08/28/2008  . VASOVAGAL SYNCOPE 08/28/2008    Past Surgical History:  Procedure Laterality Date  . CHOLECYSTECTOMY      OB History    Gravida  4   Para  2   Term  2   Preterm      AB  2   Living  1     SAB  2   TAB      Ectopic      Multiple  0   Live Births  1            Home Medications    Prior to Admission medications   Medication Sig Start Date End Date Taking? Authorizing Provider  gabapentin (NEURONTIN) 100 MG capsule Take 2 capsules (200 mg total) by mouth 2 (two) times daily. 03/20/20  Yes Flinchum, Kelby Aline, FNP  levonorgestrel (MIRENA) 20 MCG/24HR IUD 1 each by Intrauterine route once.   Yes [provider]  pantoprazole (PROTONIX) 40 MG tablet Take 1 tablet (40 mg total) by mouth daily before breakfast. 02/27/20  Yes Karen Kitchens, NP  hydrOXYzine (ATARAX/VISTARIL) 25 MG tablet Take 1 tablet (25 mg total) by mouth every 8 (eight) hours as needed. 03/26/20   Karen Kitchens, NP  metroNIDAZOLE (FLAGYL) 500 MG tablet Take 1 tablet (500 mg total) by mouth 2 (two) times daily. 04/08/20   Norval Gable, MD  sertraline (ZOLOFT) 25 MG tablet Take 1 tablet (25 mg total) by mouth daily. 03/26/20   Karen Kitchens, NP  albuterol (VENTOLIN HFA) 108 (90 Base) MCG/ACT inhaler Inhale 2 puffs into the lungs every 4 (four) hours as needed  for wheezing or shortness of breath. Patient not taking: Reported on 02/26/2020 02/20/20 02/27/20  Sharion Balloon, NP  famotidine (PEPCID) 20 MG tablet Take 1 tablet (20 mg total) by mouth 2 (two) times daily. 02/24/20 02/27/20  Lilia Pro., MD    Family History Family History  Problem Relation Age of Onset  . Breast cancer Mother 18       currently 6  . Hypertension Father   . Arthritis Other   . Diabetes Other   . Cancer Other   . Diabetes Maternal Grandmother   . Diabetes Maternal Grandfather   . Diabetes Paternal Grandmother   . Diabetes Paternal Grandfather   . Breast cancer Maternal Aunt 55       currently 11; PALB2 mutation  . Colon cancer Neg Hx   . Heart disease Neg Hx     Social History Social History   Tobacco Use  . Smoking status:  Current Every Day Smoker    Packs/day: 0.25    Types: Cigarettes  . Smokeless tobacco: Never Used  Substance Use Topics  . Alcohol use: No  . Drug use: Yes    Types: Marijuana     Allergies   Amoxicillin and Penicillins   Review of Systems Review of Systems  HENT: Positive for ear pain.   Genitourinary: Positive for dysuria.     Physical Exam Triage Vital Signs ED Triage Vitals  Enc Vitals Group     BP 04/08/20 0918 113/78     Pulse Rate 04/08/20 0918 88     Resp 04/08/20 0918 16     Temp 04/08/20 0918 98.3 F (36.8 C)     Temp Source 04/08/20 0918 Oral     SpO2 04/08/20 0918 100 %     Weight 04/08/20 0916 150 lb (68 kg)     Height 04/08/20 0916 _0  (1.626 m)     Head Circumference --      Peak Flow --      Pain Score 04/08/20 0916 5     Pain Loc --      Pain Edu? --      Excl. in [place]? --    No data found.  Updated Vital Signs BP 113/78 (BP Location: Right Arm)   Pulse 88   Temp 98.3 F (36.8 C) (Oral)   Resp 16   Ht _1  (1.626 m)   Wt 68 kg   SpO2 100%   BMI 25.75 kg/m   Visual Acuity Right Eye Distance:   Left Eye Distance:   Bilateral Distance:    Right Eye Near:   Left Eye Near:    Bilateral Near:     Physical Exam Vitals and nursing note reviewed.  Constitutional:      General: She is not in acute distress.    Appearance: Normal appearance. She is not ill-appearing, toxic-appearing or diaphoretic.  HENT:     Right Ear: Tympanic membrane and ear canal normal.     Left Ear: Tympanic membrane and ear canal normal.  Cardiovascular:     Rate and Rhythm: Normal rate.  Pulmonary:     Effort: Pulmonary effort is normal. No respiratory distress.  Neurological:     Mental Status: She is alert.      UC Treatments / Results  Labs (all labs ordered are listed, but only abnormal results are displayed) Labs Reviewed  WET PREP, GENITAL - Abnormal; Notable for the following components:      Result Value  Clue Cells Wet Prep HPF POC  PRESENT (*)    WBC, Wet Prep HPF POC FEW (*)    All other components within normal limits  URINALYSIS, COMPLETE (UACMP) WITH MICROSCOPIC - Abnormal; Notable for the following components:   Hgb urine dipstick TRACE (*)    All other components within normal limits    EKG   Radiology No results found.  Procedures Procedures (including critical care time)  Medications Ordered in UC Medications - No data to display  Initial Impression / Assessment and Plan / UC Course  I have reviewed the triage vital signs and the nursing notes.  Pertinent labs & imaging results that were available during my care of the patient were reviewed by me and considered in my medical decision making (see chart for details).      Final Clinical Impressions(s) / UC Diagnoses   Final diagnoses:  Bacterial vaginosis  Otalgia of both ears     Discharge Instructions     Over the counter antihistamine plus sudafed combination    ED Prescriptions    Medication Sig Dispense Auth. Provider   metroNIDAZOLE (FLAGYL) 500 MG tablet Take 1 tablet (500 mg total) by mouth 2 (two) times daily. 14 tablet Norval Gable, MD      1. Lab results and diagnosis reviewed with patient 2. rx as per orders above; reviewed possible side effects, interactions, risks and benefits  3. Recommend supportive treatment as above 4. Follow-up prn if symptoms worsen or don't improve   PDMP not reviewed this encounter.   Norval Gable, MD 04/08/20 1044

## 2020-04-08 NOTE — Discharge Instructions (Signed)
Over the counter antihistamine plus sudafed combination

## 2020-04-10 ENCOUNTER — Ambulatory Visit (INDEPENDENT_AMBULATORY_CARE_PROVIDER_SITE_OTHER): Payer: Self-pay | Admitting: Adult Health

## 2020-04-10 ENCOUNTER — Encounter: Payer: Self-pay | Admitting: Adult Health

## 2020-04-10 ENCOUNTER — Other Ambulatory Visit: Payer: Self-pay

## 2020-04-10 VITALS — BP 110/82 | HR 94 | Temp 96.6°F | Resp 16 | Wt 143.8 lb

## 2020-04-10 DIAGNOSIS — H6504 Acute serous otitis media, recurrent, right ear: Secondary | ICD-10-CM

## 2020-04-10 DIAGNOSIS — H9201 Otalgia, right ear: Secondary | ICD-10-CM

## 2020-04-10 DIAGNOSIS — J302 Other seasonal allergic rhinitis: Secondary | ICD-10-CM

## 2020-04-10 MED ORDER — CETIRIZINE HCL 10 MG PO TABS
10.0000 mg | ORAL_TABLET | Freq: Every day | ORAL | 0 refills | Status: DC
Start: 2020-04-10 — End: 2020-07-09

## 2020-04-10 MED ORDER — FLUTICASONE PROPIONATE 50 MCG/ACT NA SUSP
2.0000 | Freq: Every day | NASAL | 6 refills | Status: DC
Start: 2020-04-10 — End: 2020-07-09

## 2020-04-10 MED ORDER — CEFDINIR 300 MG PO CAPS: 300.0000 mg | ORAL_CAPSULE | Freq: Two times a day (BID) | ORAL | 0 refills | Status: AC

## 2020-04-10 NOTE — Progress Notes (Signed)
Established patient visit   Patient: Isabel [name]   DOB: 1991/03/09   29 y.o. Female  MRN: [id number] Visit Date: 04/10/2020  Today's healthcare provider: Marcille Buffy, FNP   Chief Complaint  Patient presents with  . Otalgia   Subjective    Otalgia  There is pain in both ears. This is a recurrent problem. The current episode started 1 to 4 weeks ago. The problem occurs constantly. The problem has been unchanged. There has been no fever. Associated symptoms include rhinorrhea. Pertinent negatives include no abdominal pain, coughing, diarrhea, ear discharge, headaches, hearing loss, neck pain, rash, sore throat or vomiting.   She reports it felt better on Omnicef she was treated with around a month ago but seems to have returned. She has not been taking Zyrtec or using Flonase.  Denies any drainage or ear trauma.   Patient  denies any fever, body aches,chills, rash, chest pain, shortness of breath, nausea, vomiting, or diarrhea.   Denies dizziness, lightheadedness, pre syncopal or syncopal episodes.     Patient Active Problem List   Diagnosis Date Noted  . Seasonal allergies 04/11/2020  . Recurrent acute serous otitis media of right ear 04/11/2020  . Right ear pain 04/11/2020  . 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 Anxiety 03/14/2020  . RUQ pain 03/14/2020  . Periumbilical abdominal pain 03/14/2020  . History of laparoscopic cholecystectomy 03/14/2020  . Right lower quadrant abdominal pain 03/14/2020  . Corpus luteum cyst of right ovary 03/14/2020  . Umbilical hernia without obstruction or gangrene 03/14/2020  . Sensation of chest pressure 02/26/2020  . Shortness of breath 02/26/2020  . Genetic testing   . Monoallelic mutation of PALB2 gene   . Family history of breast cancer in mother   . 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/24/2016  . Positive urine drug screen 03/27/2016  . Tobacco user 02/26/2016  . Irregular  periods/menstrual cycles 02/26/2016  . Neck pain 02/22/2012  . Epigastric abdominal pain 09/25/2008  . LOW BACK PAIN 08/28/2008  . VASOVAGAL SYNCOPE 08/28/2008   Past Medical History:  Diagnosis Date  . Aberrant right subclavian artery   . Anxiety   . Dysmenorrhea   . Family history of breast cancer in mother   . Genetic testing 04/25/18   PALB2 analysis @ [facility] - Familial pathogenic PALB2 mutation detected  . Migraines   . 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 Amoxicillin Hives  . Penicillins Hives       Medications: Outpatient Medications Prior to Visit  Medication Sig  . gabapentin (NEURONTIN) 100 MG capsule Take 2 capsules (200 mg total) by mouth 2 (two) times daily.  Marland Kitchen levonorgestrel (MIRENA) 20 MCG/24HR IUD 1 each by Intrauterine route once.  . pantoprazole (PROTONIX) 40 MG tablet Take 1 tablet (40 mg total) by mouth daily before breakfast.  . hydrOXYzine (ATARAX/VISTARIL) 25 MG tablet Take 1 tablet (25 mg total) by mouth every 8 (eight) hours as needed. (Patient not taking: Reported on 04/10/2020)  . metroNIDAZOLE (FLAGYL) 500 MG tablet Take 1 tablet (500 mg total) by mouth 2 (two) times daily. (Patient not taking: Reported on 04/10/2020)  . sertraline (ZOLOFT) 25 MG tablet Take 1 tablet (25 mg total) by mouth daily. (Patient not taking: Reported on 04/10/2020)   No facility-administered medications prior to visit.    Review of Systems  Constitutional: Negative.   HENT: Positive  for congestion, ear pain, postnasal drip, rhinorrhea, sinus pressure and tinnitus. Negative for dental problem, drooling, ear discharge, facial swelling, hearing loss, mouth sores, nosebleeds, sinus pain, sneezing, sore throat, trouble swallowing and voice change.   Respiratory: Negative.  Negative for cough.   Cardiovascular: Negative.   Gastrointestinal: Negative for abdominal pain, diarrhea and  vomiting.  Genitourinary: Negative.   Musculoskeletal: Negative for neck pain.  Skin: Negative for rash.  Neurological: Negative for headaches.   Allergies  Allergen Reactions  . Amoxicillin Hives  . Penicillins Hives   Last CBC Lab Results  Component Value Date   WBC 10.2 02/26/2020   HGB 15.5 (H) 02/26/2020   HCT 43.5 02/26/2020   MCV 90.2 02/26/2020   MCH 32.2 02/26/2020   RDW 12.7 02/26/2020   PLT 231 81/18/8677   Last metabolic panel Lab Results  Component Value Date   GLUCOSE 92 02/26/2020   NA 138 02/26/2020   K 4.4 02/26/2020   CL 104 02/26/2020   CO2 22 02/26/2020   BUN 13 02/26/2020   CREATININE 0.80 02/26/2020   GFRNONAA >60 02/26/2020   GFRAA >60 02/26/2020   CALCIUM 9.3 02/26/2020   PROT 7.3 02/26/2020   ALBUMIN 4.2 02/26/2020   LABGLOB 1.9 07/19/2019   AGRATIO 2.3 (H) 07/19/2019   BILITOT 1.0 02/26/2020   ALKPHOS 48 02/26/2020   AST 14 (L) 02/26/2020   ALT 13 02/26/2020   ANIONGAP 12 02/26/2020      Objective    BP 110/82   Pulse 94   Temp (!) 96.6 F (35.9 C) (Oral)   Resp 16   Wt 143 lb 12.8 oz (65.2 kg)   SpO2 100%   BMI 24.68 kg/m  BP Readings from Last 3 Encounters:  04/10/20 110/82  04/08/20 113/78  04/01/20 105/70      Physical Exam Vitals reviewed.  Constitutional:      General: She is not in acute distress.    Appearance: Normal appearance. She is normal weight. She is not ill-appearing, toxic-appearing or diaphoretic.  HENT:     Head: Normocephalic and atraumatic.     Jaw: There is normal jaw occlusion.     Right Ear: Hearing and external ear normal. No drainage or swelling. A middle ear effusion (brown / yellow fluid ) is present. There is no impacted cerumen. Tympanic membrane is erythematous (dull in appearance ). Tympanic membrane is not perforated or bulging.     Left Ear: Hearing and external ear normal. No drainage or swelling. A middle ear effusion (clear fluid ) is present. There is no impacted cerumen. Tympanic  membrane is not perforated, erythematous or bulging.     Nose: Congestion and rhinorrhea present.     Mouth/Throat:     Mouth: Mucous membranes are moist.     Pharynx: Posterior oropharyngeal erythema present. No oropharyngeal exudate.     Comments:  Cobblestoning posterior pharynx; bilateral allergic shiners; bilateral nasal turbinates mild edema erythema clear discharge;    Eyes:     General: No scleral icterus.       Right eye: No discharge.        Left eye: No discharge.     Extraocular Movements: Extraocular movements intact.     Conjunctiva/sclera: Conjunctivae normal.     Pupils: Pupils are equal, round, and reactive to light.  Cardiovascular:     Pulses: Normal pulses.     Heart sounds: Normal heart sounds.  Pulmonary:     Effort: Pulmonary effort is normal.  Breath sounds: Normal breath sounds.  Abdominal:     Palpations: Abdomen is soft.  Skin:    General: Skin is warm.     Capillary Refill: Capillary refill takes less than 2 seconds.     Findings: No rash.  Neurological:     Mental Status: She is alert.  Psychiatric:        Attention and Perception: Attention and perception normal.        Mood and Affect: Mood and affect normal.        Speech: Speech normal.        Behavior: Behavior normal. Behavior is cooperative.        Thought Content: Thought content normal.        Cognition and Memory: Cognition and memory normal.        Judgment: Judgment normal.      No results found for any visits on 04/10/20.  Assessment & Plan    Recurrent acute serous otitis media of right ear  Seasonal allergies  Right ear pain   Meds ordered this encounter  Medications  . fluticasone (FLONASE) 50 MCG/ACT nasal spray    Sig: Place 2 sprays into both nostrils daily.    Dispense:  16 g    Refill:  6  . cetirizine (ZYRTEC ALLERGY) 10 MG tablet    Sig: Take 1 tablet (10 mg total) by mouth daily.    Dispense:  90 tablet    Refill:  0  . cefdinir (OMNICEF) 300 MG  capsule    Sig: Take 1 capsule (300 mg total) by mouth 2 (two) times daily for 10 days.    Dispense:  20 capsule    Refill:  0   Has taken Omnicef without any reaction. Start allergy medications. Shower off after coming inside, wash hair, pillow case frequently, Change in home air filter.  Return in about 1 week (around 04/17/2020), or if symptoms worsen or fail to improve, for at any time for any worsening symptoms.     Advised patient call the office or your primary care doctor for an appointment if no improvement within 72 hours or if any symptoms change or worsen at any time  Advised ER or urgent Care if after hours or on weekend. Call 911 for emergency symptoms at any time.Patinet verbalized understanding of all instructions given/reviewed and treatment plan and has no further questions or concerns at this time.      The entirety of the information documented in the History of Present Illness, Review of Systems and Physical Exam were personally obtained by me. Portions of this information were initially documented by the CMA and reviewed by me for thoroughness and accuracy.   Marcille Buffy, FNP   Addressed acute medical problems today requiring 25  minutes reviewing her medical record, counseling patient regarding her conditions and coordination of care.    Marcille Buffy, [place] [phone] (phone) [phone] (fax)  [place]

## 2020-04-10 NOTE — Patient Instructions (Addendum)
Allergies, Adult An allergy means that your body reacts to something that bothers it (allergen). It is not a normal reaction. This can happen from something that you:  Eat.  Breathe in.  Touch. You can have an allergy (be allergic) to:  Outdoor things, like: ? Pollen. ? Grass. ? Weeds.  Indoor things, like: ? Dust. ? Smoke. ? Pet dander.  Foods.  Medicines.  Things that bother your skin, like: ? Detergents. ? Chemicals. ? Latex.  Perfume.  Bugs. An allergy cannot spread from person to person (is not contagious). Follow these instructions at home:         Stay away from things that you know you are allergic to.  If you have allergies to things in the air, wash out your nose each day. Do it with one of these: ? A salt-water (saline) spray. ? A container (neti pot).  Take over-the-counter and prescription medicines only as told by your doctor.  Keep all follow-up visits as told by your doctor. This is important.  If you are at risk for a very bad allergy reaction (anaphylaxis), keep an auto-injector with you all the time. This is called an epinephrine injection. ? This is pre-measured medicine with a needle. You can put it into your skin by yourself. ? Right after you have a very bad allergy reaction, you or a person with you must give the medicine in less than a few minutes. This is an emergency.  If you have ever had a very bad allergy reaction, wear a medical alert bracelet or necklace. Your very bad allergy should be written on it. Contact a health care provider if:  Your symptoms do not get better with treatment. Get help right away if:  You have symptoms of a very bad allergy reaction. These include: ? A swollen mouth, tongue, or throat. ? Pain or tightness in your chest. ? Trouble breathing. ? Being short of breath. ? Dizziness. ? Fainting. ? Very bad pain in your belly (abdomen). ? Throwing up (vomiting). ? Watery poop (diarrhea). Summary   An allergy means that your body reacts to something that bothers it (allergen). It is not a normal reaction.  Stay away from things that make your body react.  Take over-the-counter and prescription medicines only as told by your doctor.  If you are at risk for a very bad allergy reaction, carry an auto-injector (epinephrine injection) all the time. Also, wear a medical alert bracelet or necklace so people know about your allergy. This information is not intended to replace advice given to you by your health care provider. Make sure you discuss any questions you have with your health care provider. Document Revised: 02/14/2019 Document Reviewed: 02/08/2017 Elsevier Patient Education  2020 [employer] Cetirizine tablets What is this medicine? CETIRIZINE (se TI ra zeen) is an antihistamine. This medicine is used to treat or prevent symptoms of allergies. It is also used to help reduce itchy skin rash and hives. This medicine may be used for other purposes; ask your health care provider or pharmacist if you have questions. COMMON BRAND NAME(S): All Day Allergy, Allergy Relief, Zyrtec, Zyrtec Hives Relief What should I tell my health care provider before I take this medicine? They need to know if you have any of these conditions:  kidney disease  liver disease  an unusual or allergic reaction to cetirizine, hydroxyzine, other medicines, foods, dyes, or preservatives  pregnant or trying to get pregnant  breast-feeding How should I use this medicine?   Take this medicine by mouth with a glass of water. Follow the directions on the prescription label. You can take this medicine with food or on an empty stomach. Take your medicine at regular times. Do not take more often than directed. You may need to take this medicine for several days before your symptoms improve. Talk to your pediatrician regarding the use of this medicine in children. Special care may be needed.  While this drug may be prescribed for children as young as 57 years of age for selected conditions, precautions do apply. Overdosage: If you think you have taken too much of this medicine contact a poison control center or emergency room at once. NOTE: This medicine is only for you. Do not share this medicine with others. What if I miss a dose? If you miss a dose, take it as soon as you can. If it is almost time for your next dose, take only that dose. Do not take double or extra doses. What may interact with this medicine?  alcohol  certain medicines for anxiety or sleep  narcotic medicines for pain  other medicines for colds or allergies This list may not describe all possible interactions. Give your health care provider a list of all the medicines, herbs, non-prescription drugs, or dietary supplements you use. Also tell them if you smoke, drink alcohol, or use illegal drugs. Some items may interact with your medicine. What should I watch for while using this medicine? Visit your doctor or health care professional for regular checks on your health. Tell your doctor if your symptoms do not improve. You may get drowsy or dizzy. Do not drive, use machinery, or do anything that needs mental alertness until you know how this medicine affects you. Do not stand or sit up quickly, especially if you are an older patient. This reduces the risk of dizzy or fainting spells. Your mouth may get dry. Chewing sugarless gum or sucking hard candy, and drinking plenty of water may help. Contact your doctor if the problem does not go away or is severe. What side effects may I notice from receiving this medicine? Side effects that you should report to your doctor or health care professional as soon as possible:  allergic reactions like skin rash, itching or hives, swelling of the face, lips, or tongue  changes in vision or hearing  fast or irregular heartbeat  trouble passing urine or change in the amount of  urine Side effects that usually do not require medical attention (report to your doctor or health care professional if they continue or are bothersome):  dizziness  dry mouth  irritability  sore throat  stomach pain  tiredness This list may not describe all possible side effects. Call your doctor for medical advice about side effects. You may report side effects to FDA at 1-800-FDA-1088. Where should I keep my medicine? Keep out of the reach of children. Store at room temperature between 15 and 30 degrees C (59 and 86 degrees F). Throw away any unused medicine after the expiration date. NOTE: This sheet is a summary. It may not cover all possible information. If you have questions about this medicine, talk to your doctor, pharmacist, or health care provider.  2020 Elsevier/Gold Standard (2014-11-20 13:44:42) Fluticasone nasal spray What is this medicine? FLUTICASONE (floo TIK a sone) is a corticosteroid. This medicine is used to treat the symptoms of allergies like sneezing, itchy red eyes, and itchy, runny, or stuffy nose. This medicine is also used to treat  nasal polyps. This medicine may be used for other purposes; ask your health care provider or pharmacist if you have questions. COMMON BRAND NAME(S): ClariSpray, Flonase, Flonase Allergy Relief, Flonase Sensimist, Veramyst, XHANCE What should I tell my health care provider before I take this medicine? They need to know if you have any of these conditions:  eye disease, vision problems  infection, like tuberculosis, herpes, or fungal infection  recent surgery on nose or sinuses  taking a corticosteroid by mouth  an unusual or allergic reaction to fluticasone, steroids, other medicines, foods, dyes, or preservatives  pregnant or trying to get pregnant  breast-feeding How should I use this medicine? This medicine is for use in the nose. Follow the directions on your product or prescription label. This medicine works best  if used at regular intervals. Do not use more often than directed. Make sure that you are using your nasal spray correctly. After 6 months of daily use for allergies, talk to your doctor or health care professional before using it for a longer time. Ask your doctor or health care professional if you have any questions. Talk to your pediatrician regarding the use of this medicine in children. Special care may be needed. Some products have been used for allergies in children as young as 2 years. After 2 months of daily use without a prescription in a child, talk to your pediatrician before using it for a longer time. Use of this medicine for nasal polyps is not approved in children. Overdosage: If you think you have taken too much of this medicine contact a poison control center or emergency room at once. NOTE: This medicine is only for you. Do not share this medicine with others. What if I miss a dose? If you miss a dose, use it as soon as you remember. If it is almost time for your next dose, use only that dose and continue with your regular schedule. Do not use double or extra doses. What may interact with this medicine?  certain antibiotics like clarithromycin and telithromycin  certain medicines for fungal infections like ketoconazole, itraconazole, and voriconazole  conivaptan  nefazodone  some medicines for HIV  vaccines This list may not describe all possible interactions. Give your health care provider a list of all the medicines, herbs, non-prescription drugs, or dietary supplements you use. Also tell them if you smoke, drink alcohol, or use illegal drugs. Some items may interact with your medicine. What should I watch for while using this medicine? Visit your healthcare professional for regular checks on your progress. Tell your healthcare professional if your symptoms do not start to get better or if they get worse. This medicine may increase your risk of getting an infection. Tell  your doctor or health care professional if you are around anyone with measles or chickenpox, or if you develop sores or blisters that do not heal properly. What side effects may I notice from receiving this medicine? Side effects that you should report to your doctor or health care professional as soon as possible:  allergic reactions like skin rash, itching or hives, swelling of the face, lips, or tongue  changes in vision  crusting or sores in the nose  nosebleed  signs and symptoms of infection like fever or chills; cough; sore throat  white patches or sores in the mouth or nose Side effects that usually do not require medical attention (report to your doctor or health care professional if they continue or are bothersome):  burning or  irritation inside the nose or throat  changes in taste or smell  cough  headache This list may not describe all possible side effects. Call your doctor for medical advice about side effects. You may report side effects to FDA at 1-800-FDA-1088. Where should I keep my medicine? Keep out of the reach of children. Store at room temperature between 15 and 30 degrees C (59 and 86 degrees F). Avoid exposure to extreme heat, cold, or light. Throw away any unused medicine after the expiration date. NOTE: This sheet is a summary. It may not cover all possible information. If you have questions about this medicine, talk to your doctor, pharmacist, or health care provider.  2020 Elsevier/Gold Standard (2017-11-18 14:10:08) Sinusitis, Adult Sinusitis is inflammation of your sinuses. Sinuses are hollow spaces in the bones around your face. Your sinuses are located:  Around your eyes.  In the middle of your forehead.  Behind your nose.  In your cheekbones. Mucus normally drains out of your sinuses. When your nasal tissues become inflamed or swollen, mucus can become trapped or blocked. This allows bacteria, viruses, and fungi to grow, which leads to  infection. Most infections of the sinuses are caused by a virus. Sinusitis can develop quickly. It can last for up to 4 weeks (acute) or for more than 12 weeks (chronic). Sinusitis often develops after a cold. What are the causes? This condition is caused by anything that creates swelling in the sinuses or stops mucus from draining. This includes:  Allergies.  Asthma.  Infection from bacteria or viruses.  Deformities or blockages in your nose or sinuses.  Abnormal growths in the nose (nasal polyps).  Pollutants, such as chemicals or irritants in the air.  Infection from fungi (rare). What increases the risk? You are more likely to develop this condition if you:  Have a weak body defense system (immune system).  Do a lot of swimming or diving.  Overuse nasal sprays.  Smoke. What are the signs or symptoms? The main symptoms of this condition are pain and a feeling of pressure around the affected sinuses. Other symptoms include:  Stuffy nose or congestion.  Thick drainage from your nose.  Swelling and warmth over the affected sinuses.  Headache.  Upper toothache.  A cough that may get worse at night.  Extra mucus that collects in the throat or the back of the nose (postnasal drip).  Decreased sense of smell and taste.  Fatigue.  A fever.  Sore throat.  Bad breath. How is this diagnosed? This condition is diagnosed based on:  Your symptoms.  Your medical history.  A physical exam.  Tests to find out if your condition is acute or chronic. This may include: ? Checking your nose for nasal polyps. ? Viewing your sinuses using a device that has a light (endoscope). ? Testing for allergies or bacteria. ? Imaging tests, such as an MRI or CT scan. In rare cases, a bone biopsy may be done to rule out more serious types of fungal sinus disease. How is this treated? Treatment for sinusitis depends on the cause and whether your condition is chronic or  acute.  If caused by a virus, your symptoms should go away on their own within 10 days. You may be given medicines to relieve symptoms. They include: ? Medicines that shrink swollen nasal passages (topical intranasal decongestants). ? Medicines that treat allergies (antihistamines). ? A spray that eases inflammation of the nostrils (topical intranasal corticosteroids). ? Rinses that help get rid  of thick mucus in your nose (nasal saline washes).  If caused by bacteria, your health care provider may recommend waiting to see if your symptoms improve. Most bacterial infections will get better without antibiotic medicine. You may be given antibiotics if you have: ? A severe infection. ? A weak immune system.  If caused by narrow nasal passages or nasal polyps, you may need to have surgery. Follow these instructions at home: Medicines  Take, use, or apply over-the-counter and prescription medicines only as told by your health care provider. These may include nasal sprays.  If you were prescribed an antibiotic medicine, take it as told by your health care provider. Do not stop taking the antibiotic even if you start to feel better. Hydrate and humidify   Drink enough fluid to keep your urine pale yellow. Staying hydrated will help to thin your mucus.  Use a cool mist humidifier to keep the humidity level in your home above 50%.  Inhale steam for 10-15 minutes, 3-4 times a day, or as told by your health care provider. You can do this in the bathroom while a hot shower is running.  Limit your exposure to cool or dry air. Rest  Rest as much as possible.  Sleep with your head raised (elevated).  Make sure you get enough sleep each night. General instructions   Apply a warm, moist washcloth to your face 3-4 times a day or as told by your health care provider. This will help with discomfort.  Wash your hands often with soap and water to reduce your exposure to germs. If soap and water  are not available, use hand sanitizer.  Do not smoke. Avoid being around people who are smoking (secondhand smoke).  Keep all follow-up visits as told by your health care provider. This is important. Contact a health care provider if:  You have a fever.  Your symptoms get worse.  Your symptoms do not improve within 10 days. Get help right away if:  You have a severe headache.  You have persistent vomiting.  You have severe pain or swelling around your face or eyes.  You have vision problems.  You develop confusion.  Your neck is stiff.  You have trouble breathing. Summary  Sinusitis is soreness and inflammation of your sinuses. Sinuses are hollow spaces in the bones around your face.  This condition is caused by nasal tissues that become inflamed or swollen. The swelling traps or blocks the flow of mucus. This allows bacteria, viruses, and fungi to grow, which leads to infection.  If you were prescribed an antibiotic medicine, take it as told by your health care provider. Do not stop taking the antibiotic even if you start to feel better.  Keep all follow-up visits as told by your health care provider. This is important. This information is not intended to replace advice given to you by your health care provider. Make sure you discuss any questions you have with your health care provider. Document Revised: 03/28/2018 Document Reviewed: 03/28/2018 Elsevier Patient Education  [place].  Bacterial Vaginosis- information you can read- not new diagnosis- what you are being treated for from urgent care. Return if symptoms persist, change or worsen at anytime.   Bacterial vaginosis is an infection of the vagina. It happens when too many normal germs (healthy bacteria) grow in the vagina. This infection puts you at risk for infections from sex (STIs). Treating this infection can lower your risk for some STIs. You should also treat  this if you are pregnant. It can cause  your baby to be born early. Follow these instructions at home: Medicines  Take over-the-counter and prescription medicines only as told by your doctor.  Take or use your antibiotic medicine as told by your doctor. Do not stop taking or using it even if you start to feel better. General instructions  If you your sexual partner is a woman, tell her that you have this infection. She needs to get treatment if she has symptoms. If you have a female partner, he does not need to be treated.  During treatment: ? Avoid sex. ? Do not douche. ? Avoid alcohol as told. ? Avoid breastfeeding as told.  Drink enough fluid to keep your pee (urine) clear or pale yellow.  Keep your vagina and butt (rectum) clean. ? Wash the area with warm water every day. ? Wipe from front to back after you use the toilet.  Keep all follow-up visits as told by your doctor. This is important. Preventing this condition  Do not douche.  Use only warm water to wash around your vagina.  Use protection when you have sex. This includes: ? Latex condoms. ? Dental dams.  Limit how many people you have sex with. It is best to only have sex with the same person (be monogamous).  Get tested for STIs. Have your partner get tested.  Wear underwear that is cotton or lined with cotton.  Avoid tight pants and pantyhose. This is most important in summer.  Do not use any products that have nicotine or tobacco in them. These include cigarettes and e-cigarettes. If you need help quitting, ask your doctor.  Do not use illegal drugs.  Limit how much alcohol you drink. Contact a doctor if:  Your symptoms do not get better, even after you are treated.  You have more discharge or pain when you pee (urinate).  You have a fever.  You have pain in your belly (abdomen).  You have pain with sex.  Your bleed from your vagina between periods. Summary  This infection happens when too many germs (bacteria) grow in the  vagina.  Treating this condition can lower your risk for some infections from sex (STIs).  You should also treat this if you are pregnant. It can cause early (premature) birth.  Do not stop taking or using your antibiotic medicine even if you start to feel better. This information is not intended to replace advice given to you by your health care provider. Make sure you discuss any questions you have with your health care provider. Document Revised: 10/08/2017 Document Reviewed: 07/11/2016 Elsevier Patient Education  [place].  Otitis Media, Adult  Otitis media means that the middle ear is red and swollen (inflamed) and full of fluid. The condition usually goes away on its own. Follow these instructions at home:  Take over-the-counter and prescription medicines only as told by your doctor.  If you were prescribed an antibiotic medicine, take it as told by your doctor. Do not stop taking the antibiotic even if you start to feel better.  Keep all follow-up visits as told by your doctor. This is important. Contact a doctor if:  You have bleeding from your nose.  There is a lump on your neck.  You are not getting better in 5 days.  You feel worse instead of better. Get help right away if:  You have pain that is not helped with medicine.  You have swelling, redness, or pain  around your ear.  You get a stiff neck.  You cannot move part of your face (paralyzed).  You notice that the bone behind your ear hurts when you touch it.  You get a very bad headache. Summary  Otitis media means that the middle ear is red, swollen, and full of fluid.  This condition usually goes away on its own. In some cases, treatment may be needed.  If you were prescribed an antibiotic medicine, take it as told by your doctor. This information is not intended to replace advice given to you by your health care provider. Make sure you discuss any questions you have with your health care  provider. Document Revised: 10/08/2017 Document Reviewed: 11/16/2016 Elsevier Patient Education  [place] What is this medicine? CEFDINIR (SEF di ner) is a cephalosporin antibiotic. It treats some infections caused by bacteria. It will not work for colds, the flu, or other viruses. This medicine may be used for other purposes; ask your health care provider or pharmacist if you have questions. COMMON BRAND NAME(S): Omnicef What should I tell my health care provider before I take this medicine? They need to know if you have any of these conditions:  bleeding problems  kidney disease  stomach or intestine problems (especially colitis)  an unusual or allergic reaction to cefdinir, other cephalosporin antibiotics, penicillin, penicillamine, other foods, dyes or preservatives  pregnant or trying to get pregnant  breast-feeding How should I use this medicine? Take this drug by mouth. Take it as directed on the prescription label at the same time every day. You can take it with or without food. If it upsets your stomach, take it with food. Take all of this drug unless your health care provider tells you to stop it early. Keep taking it even if you think you are better. Take products with aluminum, magnesium, or iron in them at a different time of day than this drug. Take this drug 2 hours BEFORE or 2 hours AFTER these products. Talk to your health care provider about the use of this drug in children. While it may be prescribed for selected conditions, precautions do apply. Overdosage: If you think you have taken too much of this medicine contact a poison control center or emergency room at once. NOTE: This medicine is only for you. Do not share this medicine with others. What if I miss a dose? If you miss a dose, take it as soon as you can. If it is almost time for your next dose, take only that dose. Do not take double or extra doses. What may interact with this  medicine?  antacids that contain aluminum or magnesium  iron supplements  other antibiotics  probenecid This list may not describe all possible interactions. Give your health care provider a list of all the medicines, herbs, non-prescription drugs, or dietary supplements you use. Also tell them if you smoke, drink alcohol, or use illegal drugs. Some items may interact with your medicine. What should I watch for while using this medicine? Tell your doctor or health care provider if your symptoms do not get better in a few days. This medicine may cause serious skin reactions. They can happen weeks to months after starting the medicine. Contact your health care provider right away if you notice fevers or flu-like symptoms with a rash. The rash may be red or purple and then turn into blisters or peeling of the skin. Or, you might notice a red rash with swelling  of the face, lips or lymph nodes in your neck or under your arms. If you are diabetic you may get a false-positive result for sugar in your urine. Check with your doctor or health care provider before you change your diet or the dose of your diabetes medicine. What side effects may I notice from receiving this medicine? Side effects that you should report to your doctor or health care professional as soon as possible:  allergic reactions like skin rash, itching or hives, swelling of the face, lips, or tongue  bloody or watery diarrhea  breathing problems  fever  redness, blistering, peeling or loosening of the skin, including inside the mouth  seizures  trouble passing urine or change in the amount of urine  unusual bleeding or bruising  unusually weak or tired Side effects that usually do not require medical attention (report to your doctor or health care professional if they continue or are bothersome):  constipation  diarrhea  dizziness  dry mouth  headache  loss of appetite  nausea, vomiting  stomach  pain  stool discoloration  tiredness  vaginal discharge, itching, or odor in women This list may not describe all possible side effects. Call your doctor for medical advice about side effects. You may report side effects to FDA at 1-800-FDA-1088. Where should I keep my medicine? Keep out of the reach of children and pets. Store at room temperature between 20 and 25 degrees C (68 and 77 degrees F). Throw away any unused drug after the expiration date. NOTE: This sheet is a summary. It may not cover all possible information. If you have questions about this medicine, talk to your doctor, pharmacist, or health care provider.  2020 Elsevier/Gold Standard (2019-05-31 15:57:02)

## 2020-04-11 DIAGNOSIS — H6504 Acute serous otitis media, recurrent, right ear: Secondary | ICD-10-CM | POA: Insufficient documentation

## 2020-04-11 DIAGNOSIS — H9201 Otalgia, right ear: Secondary | ICD-10-CM | POA: Insufficient documentation

## 2020-04-11 DIAGNOSIS — J302 Other seasonal allergic rhinitis: Secondary | ICD-10-CM | POA: Insufficient documentation

## 2020-04-13 ENCOUNTER — Other Ambulatory Visit: Payer: Self-pay | Admitting: Adult Health

## 2020-04-14 ENCOUNTER — Encounter: Payer: Self-pay | Admitting: Adult Health

## 2020-04-15 ENCOUNTER — Other Ambulatory Visit: Payer: Self-pay | Admitting: Adult Health

## 2020-04-15 ENCOUNTER — Encounter: Payer: Self-pay | Admitting: Adult Health

## 2020-04-15 DIAGNOSIS — J029 Acute pharyngitis, unspecified: Secondary | ICD-10-CM | POA: Insufficient documentation

## 2020-04-15 MED ORDER — GABAPENTIN 100 MG PO CAPS: 200.0000 mg | ORAL_CAPSULE | Freq: Two times a day (BID) | ORAL | 0 refills | Status: DC

## 2020-04-16 LAB — CMV ABS, IGG+IGM (CYTOMEGALOVIRUS)
CMV Ab - IgG: <0.6 U/mL (ref 0.00–0.59)
CMV IgM Ser EIA-aCnc: <30 AU/mL (ref 0.0–29.9)

## 2020-04-16 LAB — MONO QUAL W/RFLX QN: Mono Qual W/Rflx Qn: NEGATIVE

## 2020-04-16 NOTE — Progress Notes (Signed)
Negative Antibodies for past or current infection with CMV. Negative mono test . Follow up as needed or if symptoms persist at anytime.

## 2020-04-17 ENCOUNTER — Encounter: Payer: Self-pay | Admitting: Adult Health

## 2020-04-18 ENCOUNTER — Ambulatory Visit
Admission: EM | Admit: 2020-04-18 | Discharge: 2020-04-18 | Disposition: A | Discharge status: Home / Self Care | Payer: Medicaid Other | Attending: Emergency Medicine | Admitting: Emergency Medicine

## 2020-04-18 ENCOUNTER — Other Ambulatory Visit: Payer: Self-pay

## 2020-04-18 ENCOUNTER — Ambulatory Visit (INDEPENDENT_AMBULATORY_CARE_PROVIDER_SITE_OTHER): Payer: Self-pay | Admitting: Gastroenterology

## 2020-04-18 ENCOUNTER — Encounter: Payer: Self-pay | Admitting: Gastroenterology

## 2020-04-18 VITALS — BP 118/79 | HR 94 | Temp 98.2°F | Ht 64.0 in | Wt 141.0 lb

## 2020-04-18 DIAGNOSIS — H9203 Otalgia, bilateral: Secondary | ICD-10-CM

## 2020-04-18 DIAGNOSIS — J029 Acute pharyngitis, unspecified: Secondary | ICD-10-CM

## 2020-04-18 DIAGNOSIS — R1013 Epigastric pain: Secondary | ICD-10-CM

## 2020-04-18 MED ORDER — IBUPROFEN 600 MG PO TABS
600.0000 mg | ORAL_TABLET | Freq: Four times a day (QID) | ORAL | 0 refills | Status: DC | PRN
Start: 2020-04-18 — End: 2020-07-23

## 2020-04-18 NOTE — ED Provider Notes (Signed)
Isabel [name]    CSN: [id number] Arrival date & time: 04/18/20  1506      History   Chief Complaint Chief Complaint  Patient presents with  . Otalgia    HPI Isabel [name] is a 29 y.o. female.   Patient presents with bilateral ear pain.  Also reports a sore throat and "tingling" of her tongue.  She denies fever, chills, cough, shortness of breath, vomiting, diarrhea, rash, or other symptoms.  Patient was seen by PCP on 04/10/2020; diagnosed with right otitis media; treated with cefdinir.  She smokes 0.5 ppd.   The history is provided by the patient.    Past Medical History:  Diagnosis Date  . Aberrant right subclavian artery   . Anxiety   . Dysmenorrhea   . Family history of breast cancer in mother   . Genetic testing 04/25/18   PALB2 analysis @ [facility] - Familial pathogenic PALB2 mutation detected  . Migraines   . 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 Sorethroat 04/15/2020  . Seasonal allergies 04/11/2020  . Recurrent acute serous otitis media of right ear 04/11/2020  . Right ear pain 04/11/2020  . 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 Anxiety 03/14/2020  . RUQ pain 03/14/2020  . Periumbilical abdominal pain 03/14/2020  . History of laparoscopic cholecystectomy 03/14/2020  . Right lower quadrant abdominal pain 03/14/2020  . Corpus luteum cyst of right ovary 03/14/2020  . Umbilical hernia without obstruction or gangrene 03/14/2020  . Sensation of chest pressure 02/26/2020  . Shortness of breath 02/26/2020  . Genetic testing   . Monoallelic mutation of PALB2 gene   . Family history of breast cancer in mother   . 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/24/2016  . Positive urine drug screen 03/27/2016  . Tobacco user 02/26/2016  . Irregular periods/menstrual cycles 02/26/2016  . Neck pain 02/22/2012  .  Epigastric abdominal pain 09/25/2008  . LOW BACK PAIN 08/28/2008  . VASOVAGAL SYNCOPE 08/28/2008    Past Surgical History:  Procedure Laterality Date  . CHOLECYSTECTOMY      OB History    Gravida  4   Para  2   Term  2   Preterm      AB  2   Living  1     SAB  2   TAB      Ectopic      Multiple  0   Live Births  1            Home Medications    Prior to Admission medications   Medication Sig Start Date End Date Taking? Authorizing Provider  cefdinir (OMNICEF) 300 MG capsule Take 1 capsule (300 mg total) by mouth 2 (two) times daily for 10 days. 04/10/20 04/20/20 Yes Flinchum, Kelby Aline, FNP  cetirizine (ZYRTEC ALLERGY) 10 MG tablet Take 1 tablet (10 mg total) by mouth daily. 04/10/20  Yes Flinchum, Kelby Aline, FNP  fluticasone (FLONASE) 50 MCG/ACT nasal spray Place 2 sprays into both nostrils daily. 04/10/20  Yes Flinchum, Kelby Aline, FNP  gabapentin (NEURONTIN) 100 MG capsule Take 2 capsules (200 mg total) by mouth 2 (two) times daily. 04/15/20  Yes Flinchum, Kelby Aline, FNP  hydrOXYzine (ATARAX/VISTARIL) 25 MG tablet Take 1 tablet (25 mg total) by mouth every 8 (eight) hours as needed. 03/26/20  Yes Karen Kitchens, NP  levonorgestrel (  MIRENA) 20 MCG/24HR IUD 1 each by Intrauterine route once.   Yes [provider]  pantoprazole (PROTONIX) 40 MG tablet Take 1 tablet (40 mg total) by mouth daily before breakfast. 02/27/20  Yes Karen Kitchens, NP  ibuprofen (ADVIL) 600 MG tablet Take 1 tablet (600 mg total) by mouth every 6 (six) hours as needed. 04/18/20   Sharion Balloon, NP  metroNIDAZOLE (FLAGYL) 500 MG tablet Take 1 tablet (500 mg total) by mouth 2 (two) times daily. Patient not taking: Reported on 04/10/2020 04/08/20   Norval Gable, MD  sertraline (ZOLOFT) 25 MG tablet Take 1 tablet (25 mg total) by mouth daily. Patient not taking: Reported on 04/10/2020 03/26/20   Karen Kitchens, NP  albuterol (VENTOLIN HFA) 108 (90 Base) MCG/ACT inhaler Inhale 2 puffs into the  lungs every 4 (four) hours as needed for wheezing or shortness of breath. Patient not taking: Reported on 02/26/2020 02/20/20 02/27/20  Sharion Balloon, NP  famotidine (PEPCID) 20 MG tablet Take 1 tablet (20 mg total) by mouth 2 (two) times daily. 02/24/20 02/27/20  Lilia Pro., MD    Family History Family History  Problem Relation Age of Onset  . Breast cancer Mother 84       currently 20  . Hypertension Father   . Arthritis Other   . Diabetes Other   . Cancer Other   . Diabetes Maternal Grandmother   . Diabetes Maternal Grandfather   . Diabetes Paternal Grandmother   . Diabetes Paternal Grandfather   . Breast cancer Maternal Aunt 50       currently 31; PALB2 mutation  . Colon cancer Neg Hx   . Heart disease Neg Hx     Social History Social History   Tobacco Use  . Smoking status: Current Every Day Smoker    Packs/day: 0.50    Types: Cigarettes  . Smokeless tobacco: Never Used  Vaping Use  . Vaping Use: Former  Substance Use Topics  . Alcohol use: No  . Drug use: Yes    Types: Marijuana     Allergies   Amoxicillin and Penicillins   Review of Systems Review of Systems  Constitutional: Negative for chills and fever.  HENT: Positive for ear pain and sore throat. Negative for congestion and trouble swallowing.   Eyes: Negative for pain and visual disturbance.  Respiratory: Negative for cough and shortness of breath.   Cardiovascular: Negative for chest pain and palpitations.  Gastrointestinal: Negative for abdominal pain, diarrhea, nausea and vomiting.  Genitourinary: Negative for dysuria and hematuria.  Musculoskeletal: Negative for arthralgias and back pain.  Skin: Negative for color change and rash.  Neurological: Negative for seizures and syncope.  All other systems reviewed and are negative.    Physical Exam Triage Vital Signs ED Triage Vitals  Enc Vitals Group     BP      Pulse      Resp      Temp      Temp src      SpO2      Weight      Height       Head Circumference      Peak Flow      Pain Score      Pain Loc      Pain Edu?      Excl. in [place]?    No data found.  Updated Vital Signs BP 117/83 (BP Location: Left Arm)   Pulse (!) 103  Temp 99.1 F (37.3 C) (Oral)   Resp 16   Ht '5\' 4"'  (1.626 m)   Wt 141 lb 1.5 oz (64 kg)   SpO2 98%   BMI 24.22 kg/m   Visual Acuity Right Eye Distance:   Left Eye Distance:   Bilateral Distance:    Right Eye Near:   Left Eye Near:    Bilateral Near:     Physical Exam Vitals and nursing note reviewed.  Constitutional:      General: She is not in acute distress.    Appearance: She is well-developed.  HENT:     Head: Normocephalic and atraumatic.     Right Ear: Tympanic membrane and ear canal normal.     Left Ear: Tympanic membrane and ear canal normal.     Nose: Nose normal.     Mouth/Throat:     Mouth: Mucous membranes are moist.     Pharynx: Oropharynx is clear.  Eyes:     Conjunctiva/sclera: Conjunctivae normal.  Cardiovascular:     Rate and Rhythm: Normal rate and regular rhythm.     Heart sounds: No murmur heard.   Pulmonary:     Effort: Pulmonary effort is normal. No respiratory distress.     Breath sounds: Normal breath sounds.  Abdominal:     Palpations: Abdomen is soft.     Tenderness: There is no abdominal tenderness. There is no guarding or rebound.  Musculoskeletal:     Cervical back: Neck supple.  Skin:    General: Skin is warm and dry.     Findings: No rash.  Neurological:     General: No focal deficit present.     Mental Status: She is alert and oriented to person, place, and time.     Gait: Gait normal.  Psychiatric:        Mood and Affect: Mood normal.        Behavior: Behavior normal.      UC Treatments / Results  Labs (all labs ordered are listed, but only abnormal results are displayed) Labs Reviewed - No data to display  EKG   Radiology No results found.  Procedures Procedures (including critical care time)  Medications  Ordered in UC Medications - No data to display  Initial Impression / Assessment and Plan / UC Course  I have reviewed the triage vital signs and the nursing notes.  Pertinent labs & imaging results that were available during my care of the patient were reviewed by me and considered in my medical decision making (see chart for details).   Bilateral otalgia, Sore throat.  Instructed patient to finish taking the cefdinir as prescribed; she has 2 doses left she reports.  Treating discomfort with ibuprofen.  Instructed her to follow-up with her PCP or an ENT if her symptoms are not improving.  Patient agrees to plan of care.     Final Clinical Impressions(s) / UC Diagnoses   Final diagnoses:  Otalgia of both ears  Sore throat     Discharge Instructions     Finish taking the cefdinir as prescribed.    Take the prescribed ibuprofen as needed for discomfort.    Follow up with your primary care provider or an ENT if your symptoms are not improving.         ED Prescriptions    Medication Sig Dispense Auth. Provider   ibuprofen (ADVIL) 600 MG tablet Take 1 tablet (600 mg total) by mouth every 6 (six) hours as needed. 30 tablet Hall Busing,  Fredderick Phenix, NP     PDMP not reviewed this encounter.   Sharion Balloon, NP 04/18/20 [phone]

## 2020-04-18 NOTE — Discharge Instructions (Addendum)
Finish taking the cefdinir as prescribed.    Take the prescribed ibuprofen as needed for discomfort.    Follow up with your primary care provider or an ENT if your symptoms are not improving.

## 2020-04-18 NOTE — ED Triage Notes (Addendum)
Patient states that she has been having an ear infection since for about 1 month and is currently on Cefdinir and does not feel like she is improving. Patient states that her tongue has been tingling at time but is not constant. Reports that she has been using Flonase.

## 2020-04-18 NOTE — Progress Notes (Signed)
Vonda Antigua, MD [address]  [place]  [place], [place] [address]  Main: [phone]  Fax: [phone]   Primary Care Physician: Doreen Beam, FNP   Chief complaint: Epigastric pain HPI: Isabel [name] is a 29 y.o. female here for follow-up of epigastric pain.  States this has resolved.  States her PPI had made follow huge difference in her symptoms.  No nausea or vomiting.  Does report recent ear infections for which she has been on antibiotics.  Does report reproducible chest wall pain intermittently as well.  Current Outpatient Medications  Medication Sig Dispense Refill  . cefdinir (OMNICEF) 300 MG capsule Take 1 capsule (300 mg total) by mouth 2 (two) times daily for 10 days. 20 capsule 0  . cetirizine (ZYRTEC ALLERGY) 10 MG tablet Take 1 tablet (10 mg total) by mouth daily. 90 tablet 0  . fluticasone (FLONASE) 50 MCG/ACT nasal spray Place 2 sprays into both nostrils daily. 16 g 6  . gabapentin (NEURONTIN) 100 MG capsule Take 2 capsules (200 mg total) by mouth 2 (two) times daily. 120 capsule 0  . hydrOXYzine (ATARAX/VISTARIL) 25 MG tablet Take 1 tablet (25 mg total) by mouth every 8 (eight) hours as needed. (Patient not taking: Reported on 04/10/2020) 30 tablet 0  . levonorgestrel (MIRENA) 20 MCG/24HR IUD 1 each by Intrauterine route once.    . metroNIDAZOLE (FLAGYL) 500 MG tablet Take 1 tablet (500 mg total) by mouth 2 (two) times daily. (Patient not taking: Reported on 04/10/2020) 14 tablet 0  . pantoprazole (PROTONIX) 40 MG tablet Take 1 tablet (40 mg total) by mouth daily before breakfast. 30 tablet 2  . sertraline (ZOLOFT) 25 MG tablet Take 1 tablet (25 mg total) by mouth daily. (Patient not taking: Reported on 04/10/2020) 30 tablet 0   No current facility-administered medications for this visit.    Allergies as of 04/18/2020 - Review Complete 04/18/2020  Allergen Reaction Noted  . Amoxicillin Hives   . Penicillins Hives     ROS:  General:  Negative for anorexia, weight loss, fever, chills, fatigue, weakness. ENT: Negative for hoarseness, difficulty swallowing , nasal congestion. CV: Negative for chest pain, angina, palpitations, dyspnea on exertion, peripheral edema.  Respiratory: Negative for dyspnea at rest, dyspnea on exertion, cough, sputum, wheezing.  GI: See history of present illness. GU:  Negative for dysuria, hematuria, urinary incontinence, urinary frequency, nocturnal urination.  Endo: Negative for unusual weight change.    Physical Examination:   BP 118/79   Pulse 94   Temp 98.2 F (36.8 C) (Oral)   Ht 5\' 4"  (1.626 m)   Wt 141 lb (64 kg)   BMI 24.20 kg/m   General: Well-nourished, well-developed in no acute distress.  Eyes: No icterus. Conjunctivae pink. Mouth: Oropharyngeal mucosa moist and pink , no lesions erythema or exudate. Neck: Supple, Trachea midline Musculoskeletal: Reproducible right chest wall pain, with no erythema, swelling at the area Abdomen: Bowel sounds are normal, nontender, nondistended, no hepatosplenomegaly or masses, no abdominal bruits or hernia , no rebound or guarding.   Extremities: No lower extremity edema. No clubbing or deformities. Neuro: Alert and oriented x 3.  Grossly intact. Skin: Warm and dry, no jaundice.   Psych: Alert and cooperative, normal mood and affect.   Labs: CMP     Component Value Date/Time   NA 138 02/26/2020 1333   NA 141 07/19/2019 1104   NA 139 12/02/2014 1042   K 4.4 02/26/2020 1333   K 4.1  12/02/2014 1042   CL 104 02/26/2020 1333   CL 106 12/02/2014 1042   CO2 22 02/26/2020 1333   CO2 27 12/02/2014 1042   GLUCOSE 92 02/26/2020 1333   GLUCOSE 109 (H) 12/02/2014 1042   BUN 13 02/26/2020 1333   BUN 9 07/19/2019 1104   BUN 7 12/02/2014 1042   CREATININE 0.80 02/26/2020 1333   CREATININE 0.89 12/02/2014 1042   CALCIUM 9.3 02/26/2020 1333   CALCIUM 8.8 12/02/2014 1042   PROT 7.3 02/26/2020 1333   PROT 6.2 07/19/2019 1104   PROT 7.3  12/02/2014 1042   ALBUMIN 4.2 02/26/2020 1333   ALBUMIN 4.3 07/19/2019 1104   ALBUMIN 3.6 12/02/2014 1042   AST 14 (L) 02/26/2020 1333   AST 13 (L) 12/02/2014 1042   ALT 13 02/26/2020 1333   ALT 18 12/02/2014 1042   ALKPHOS 48 02/26/2020 1333   ALKPHOS 75 12/02/2014 1042   BILITOT 1.0 02/26/2020 1333   BILITOT 0.3 07/19/2019 1104   BILITOT 0.5 12/02/2014 1042   GFRNONAA >60 02/26/2020 1333   GFRNONAA >60 12/02/2014 1042   GFRNONAA >60 07/18/2014 0756   GFRAA >60 02/26/2020 1333   GFRAA >60 12/02/2014 1042   GFRAA >60 07/18/2014 0756   Lab Results  Component Value Date   WBC 10.2 02/26/2020   HGB 15.5 (H) 02/26/2020   HCT 43.5 02/26/2020   MCV 90.2 02/26/2020   PLT 231 02/26/2020    Imaging Studies: US Venous Img Lower Unilateral Right  Result Date: 03/22/2020 CLINICAL DATA:  Right lower extremity pain. EXAM: Right LOWER EXTREMITY VENOUS DOPPLER ULTRASOUND TECHNIQUE: Gray-scale sonography with compression, as well as color and duplex ultrasound, were performed to evaluate the deep venous system(s) from the level of the common femoral vein through the popliteal and proximal calf veins. COMPARISON:  None. FINDINGS: VENOUS Normal compressibility of the common femoral, superficial femoral, and popliteal veins, as well as the visualized calf veins. Visualized portions of profunda femoral vein and great saphenous vein unremarkable. No filling defects to suggest DVT on grayscale or color Doppler imaging. Doppler waveforms show normal direction of venous flow, normal respiratory plasticity and response to augmentation. Limited views of the contralateral common femoral vein are unremarkable. OTHER None. Limitations: none IMPRESSION: Normal examination. No evidence of right deep venous thrombosis. No other abnormality identified to explain pain. Electronically Signed   By: [place] Chimes M.D.   On: 03/22/2020 16:30   US Pelvic Complete With Transvaginal  Result Date: 03/22/2020 CLINICAL DATA:   Follow-up complex cyst/corpus luteum RIGHT ovary EXAM: TRANSABDOMINAL AND TRANSVAGINAL ULTRASOUND OF PELVIS TECHNIQUE: Both transabdominal and transvaginal ultrasound examinations of the pelvis were performed. Transabdominal technique was performed for global imaging of the pelvis including uterus, ovaries, adnexal regions, and pelvic cul-de-sac. It was necessary to proceed with endovaginal exam following the transabdominal exam to visualize the ovaries. COMPARISON:  10/12/2018 Correlation: CT abdomen and pelvis 03/14/2020 FINDINGS: Uterus Measurements: 6.4 x 3.6 x 4.7 cm = volume: 56 mL. Anteverted. Normal morphology without mass Endometrium Thickness: 3 mm. IUD identified in fundal portion of endometrial canal in expected position. No endometrial fluid. Right ovary Measurements: 3.4 x 1.9 x 1.6 cm = volume: 5.3 mL. Normal morphology without mass. Resolution of complicated cyst/corpus luteum previously seen within RIGHT ovary. Left ovary Measurements: 3.3 x 1.9 x 2.1 cm = volume: 6.9 mL. Small corpus luteum without additional mass Other findings No free pelvic fluid.  No adnexal masses. IMPRESSION: IUD in expected position at upper uterine endometrial canal. Resolution  of previously identified complicated cyst/corpus luteum of the RIGHT ovary. Small corpus luteum LEFT ovary without additional pelvic sonographic abnormalities. Electronically Signed   By: Lavonia Dana M.D.   On: 03/22/2020 18:06    Assessment and Plan:   Isabel [name] is a 29 y.o. y/o female presents for follow-up of epigastric pain  Epigastric pain has completely resolved with PPI Discontinue PPI after 1 more month.  She verbalized understanding  Reproducible chest wall pain due to musculoskeletal etiology.  Ice pack recommended, along with Tylenol as needed   Dr Vonda Antigua

## 2020-04-23 ENCOUNTER — Other Ambulatory Visit: Payer: Self-pay

## 2020-04-23 ENCOUNTER — Encounter: Payer: Self-pay | Admitting: Emergency Medicine

## 2020-04-23 ENCOUNTER — Ambulatory Visit
Admission: EM | Admit: 2020-04-23 | Discharge: 2020-04-23 | Disposition: A | Discharge status: Home / Self Care | Payer: Medicaid Other | Attending: Emergency Medicine | Admitting: Emergency Medicine

## 2020-04-23 DIAGNOSIS — J029 Acute pharyngitis, unspecified: Secondary | ICD-10-CM

## 2020-04-23 LAB — POCT RAPID STREP A (OFFICE): Rapid Strep A Screen: NEGATIVE

## 2020-04-23 NOTE — ED Provider Notes (Signed)
[name]    CSN: [id number] Arrival date & time: 04/23/20  3546      History   Chief Complaint Chief Complaint  Patient presents with  . Sore Throat    HPI Isabel [name] is a 29 y.o. female.   Patient presents with sore throat x3 days.  She states she observed white patches on her throat and has had a low-grade fever.  No treatment attempted at home.  She denies rash, earache, cough, shortness of breath, vomiting, diarrhea, or other symptoms.  The history is provided by the patient.    Past Medical History:  Diagnosis Date  . Aberrant right subclavian artery   . Anxiety   . Dysmenorrhea   . Family history of breast cancer in mother   . Genetic testing 04/25/18   PALB2 analysis @ [facility] - Familial pathogenic PALB2 mutation detected  . Migraines   . 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 Sorethroat 04/15/2020  . Seasonal allergies 04/11/2020  . Recurrent acute serous otitis media of right ear 04/11/2020  . Right ear pain 04/11/2020  . 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 Anxiety 03/14/2020  . RUQ pain 03/14/2020  . Periumbilical abdominal pain 03/14/2020  . History of laparoscopic cholecystectomy 03/14/2020  . Right lower quadrant abdominal pain 03/14/2020  . Corpus luteum cyst of right ovary 03/14/2020  . Umbilical hernia without obstruction or gangrene 03/14/2020  . Sensation of chest pressure 02/26/2020  . Shortness of breath 02/26/2020  . Genetic testing   . Monoallelic mutation of PALB2 gene   . Family history of breast cancer in mother   . 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/24/2016  . Positive urine drug screen 03/27/2016  . Tobacco user 02/26/2016  . Irregular periods/menstrual cycles 02/26/2016  . Neck pain 02/22/2012  . Epigastric abdominal pain 09/25/2008  . LOW BACK PAIN 08/28/2008   . VASOVAGAL SYNCOPE 08/28/2008    Past Surgical History:  Procedure Laterality Date  . CHOLECYSTECTOMY      OB History    Gravida  4   Para  2   Term  2   Preterm      AB  2   Living  1     SAB  2   TAB      Ectopic      Multiple  0   Live Births  1            Home Medications    Prior to Admission medications   Medication Sig Start Date End Date Taking? Authorizing Provider  cetirizine (ZYRTEC ALLERGY) 10 MG tablet Take 1 tablet (10 mg total) by mouth daily. 04/10/20  Yes Flinchum, Kelby Aline, FNP  fluticasone (FLONASE) 50 MCG/ACT nasal spray Place 2 sprays into both nostrils daily. 04/10/20  Yes Flinchum, Kelby Aline, FNP  gabapentin (NEURONTIN) 100 MG capsule Take 2 capsules (200 mg total) by mouth 2 (two) times daily. 04/15/20  Yes Flinchum, Kelby Aline, FNP  hydrOXYzine (ATARAX/VISTARIL) 25 MG tablet Take 1 tablet (25 mg total) by mouth every 8 (eight) hours as needed. 03/26/20  Yes Karen Kitchens, NP  ibuprofen (ADVIL) 600 MG tablet Take 1 tablet (600 mg total) by mouth every 6 (six) hours as needed. 04/18/20  Yes Sharion Balloon, NP  levonorgestrel (MIRENA) 20 MCG/24HR IUD 1 each by Intrauterine route once.  Yes [provider]  pantoprazole (PROTONIX) 40 MG tablet Take 1 tablet (40 mg total) by mouth daily before breakfast. 02/27/20  Yes Karen Kitchens, NP  metroNIDAZOLE (FLAGYL) 500 MG tablet Take 1 tablet (500 mg total) by mouth 2 (two) times daily. Patient not taking: Reported on 04/10/2020 04/08/20   Norval Gable, MD  sertraline (ZOLOFT) 25 MG tablet Take 1 tablet (25 mg total) by mouth daily. Patient not taking: Reported on 04/10/2020 03/26/20   Karen Kitchens, NP  albuterol (VENTOLIN HFA) 108 (90 Base) MCG/ACT inhaler Inhale 2 puffs into the lungs every 4 (four) hours as needed for wheezing or shortness of breath. Patient not taking: Reported on 02/26/2020 02/20/20 02/27/20  Sharion Balloon, NP  famotidine (PEPCID) 20 MG tablet Take 1 tablet (20 mg total) by  mouth 2 (two) times daily. 02/24/20 02/27/20  Lilia Pro., MD    Family History Family History  Problem Relation Age of Onset  . Breast cancer Mother 91       currently 82  . Hypertension Father   . Arthritis Other   . Diabetes Other   . Cancer Other   . Diabetes Maternal Grandmother   . Diabetes Maternal Grandfather   . Diabetes Paternal Grandmother   . Diabetes Paternal Grandfather   . Breast cancer Maternal Aunt 63       currently 92; PALB2 mutation  . Colon cancer Neg Hx   . Heart disease Neg Hx     Social History Social History   Tobacco Use  . Smoking status: Current Every Day Smoker    Packs/day: 0.50    Types: Cigarettes  . Smokeless tobacco: Never Used  Vaping Use  . Vaping Use: Former  Substance Use Topics  . Alcohol use: No  . Drug use: Yes    Types: Marijuana     Allergies   Amoxicillin and Penicillins   Review of Systems Review of Systems  Constitutional: Negative for chills and fever.  HENT: Positive for sore throat. Negative for congestion, ear pain, rhinorrhea and trouble swallowing.   Eyes: Negative for pain and visual disturbance.  Respiratory: Negative for cough and shortness of breath.   Cardiovascular: Negative for chest pain and palpitations.  Gastrointestinal: Negative for abdominal pain, diarrhea, nausea and vomiting.  Genitourinary: Negative for dysuria and hematuria.  Musculoskeletal: Negative for arthralgias and back pain.  Skin: Negative for color change and rash.  Neurological: Negative for seizures and syncope.  All other systems reviewed and are negative.    Physical Exam Triage Vital Signs ED Triage Vitals  Enc Vitals Group     BP 04/23/20 0932 114/81     Pulse Rate 04/23/20 0932 95     Resp 04/23/20 0932 18     Temp 04/23/20 0932 98.2 F (36.8 C)     Temp Source 04/23/20 0932 Oral     SpO2 04/23/20 0932 98 %     Weight 04/23/20 0931 141 lb 1.5 oz (64 kg)     Height 04/23/20 0931 '5\' 4"'  (1.626 m)     Head  Circumference --      Peak Flow --      Pain Score 04/23/20 0930 4     Pain Loc --      Pain Edu? --      Excl. in [place]? --    No data found.  Updated Vital Signs BP 114/81 (BP Location: Left Arm)   Pulse 95   Temp 98.2 F (36.8  C) (Oral)   Resp 18   Ht '5\' 4"'  (1.626 m)   Wt 141 lb 1.5 oz (64 kg)   SpO2 98%   BMI 24.22 kg/m   Visual Acuity Right Eye Distance:   Left Eye Distance:   Bilateral Distance:    Right Eye Near:   Left Eye Near:    Bilateral Near:     Physical Exam Vitals and nursing note reviewed.  Constitutional:      General: She is not in acute distress.    Appearance: She is well-developed. She is not ill-appearing.  HENT:     Head: Normocephalic and atraumatic.     Right Ear: Tympanic membrane normal.     Left Ear: Tympanic membrane normal.     Nose: Nose normal.     Mouth/Throat:     Mouth: Mucous membranes are moist.     Pharynx: Posterior oropharyngeal erythema present.  Eyes:     Conjunctiva/sclera: Conjunctivae normal.  Cardiovascular:     Rate and Rhythm: Normal rate and regular rhythm.     Heart sounds: No murmur heard.   Pulmonary:     Effort: Pulmonary effort is normal. No respiratory distress.     Breath sounds: Normal breath sounds.  Abdominal:     Palpations: Abdomen is soft.     Tenderness: There is no abdominal tenderness. There is no guarding or rebound.  Musculoskeletal:     Cervical back: Neck supple.  Skin:    General: Skin is warm and dry.     Findings: No rash.  Neurological:     General: No focal deficit present.     Mental Status: She is alert and oriented to person, place, and time.     Gait: Gait normal.  Psychiatric:        Mood and Affect: Mood normal.        Behavior: Behavior normal.      UC Treatments / Results  Labs (all labs ordered are listed, but only abnormal results are displayed) Labs Reviewed  CULTURE, GROUP A STREP [facility])  POCT RAPID STREP A (OFFICE)    EKG   Radiology No results  found.  Procedures Procedures (including critical care time)  Medications Ordered in UC Medications - No data to display  Initial Impression / Assessment and Plan / UC Course  I have reviewed the triage vital signs and the nursing notes.  Pertinent labs & imaging results that were available during my care of the patient were reviewed by me and considered in my medical decision making (see chart for details).   Sore throat.  Rapid strep negative; throat culture pending.  Instructed patient to take Tylenol or ibuprofen as needed for fever or discomfort.  Instructed her to follow-up with her PCP if her symptoms are not improving.  Patient agrees to plan of care.      Final Clinical Impressions(s) / UC Diagnoses   Final diagnoses:  Sore throat     Discharge Instructions     Your rapid strep test is negative.  A throat culture is pending; we will call you if it is positive requiring treatment.    Take Tylenol or ibuprofen as needed for fever or discomfort.    Follow up with your primary care provider if your symptoms are not improving.       ED Prescriptions    None     PDMP not reviewed this encounter.   Sharion Balloon, NP 04/23/20 [phone]

## 2020-04-23 NOTE — ED Triage Notes (Signed)
Pt c/o sore throat and whites patches on her throat. She states she started getting worse about 3 days ago. She states she has had a "low grade fever".

## 2020-04-23 NOTE — Discharge Instructions (Signed)
Your rapid strep test is negative.  A throat culture is pending; we will call you if it is positive requiring treatment.    Take Tylenol or ibuprofen as needed for fever or discomfort.     Follow-up with your primary care provider if your symptoms are not improving.      

## 2020-05-05 ENCOUNTER — Other Ambulatory Visit: Payer: Self-pay | Admitting: Adult Health

## 2020-05-05 DIAGNOSIS — F419 Anxiety disorder, unspecified: Secondary | ICD-10-CM

## 2020-05-08 ENCOUNTER — Ambulatory Visit: Payer: Medicaid Other | Admitting: Adult Health

## 2020-05-09 ENCOUNTER — Encounter: Payer: Self-pay | Admitting: Adult Health

## 2020-05-10 NOTE — Telephone Encounter (Signed)
Did you want her to have this xray done? I see that it was ordered. I can tell her that she doesn't need an appt for this if so. Please advise. Thanks!

## 2020-05-18 ENCOUNTER — Other Ambulatory Visit: Payer: Self-pay | Admitting: Adult Health

## 2020-05-18 NOTE — Telephone Encounter (Signed)
Requested Prescriptions  Pending Prescriptions Disp Refills  . gabapentin (NEURONTIN) 100 MG capsule [Pharmacy Med Name: GABAPENTIN 100 MG CAPSULE] 360 capsule 1    Sig: TAKE 2 CAPSULES (200 MG TOTAL) BY MOUTH 2 (TWO) TIMES DAILY.     Neurology: Anticonvulsants - gabapentin Passed - 05/18/2020  2:31 PM      Passed - Valid encounter within last 12 months    Recent Outpatient Visits          1 month ago Recurrent acute serous otitis media of right ear   [facility] Flinchum, Kelby Aline, FNP   2 months ago Aberrant right subclavian artery- noted on CT angiogram    [place], FNP   2 months ago Epigastric abdominal pain   [place] Flinchum, Kelby Aline, FNP      Future Appointments            In 2 weeks Pieter Partridge, [facility]

## 2020-05-20 ENCOUNTER — Ambulatory Visit: Payer: Self-pay | Admitting: Adult Health

## 2020-05-31 ENCOUNTER — Ambulatory Visit: Payer: Self-pay | Admitting: Adult Health

## 2020-06-06 ENCOUNTER — Other Ambulatory Visit: Payer: Self-pay

## 2020-06-06 ENCOUNTER — Ambulatory Visit (INDEPENDENT_AMBULATORY_CARE_PROVIDER_SITE_OTHER): Payer: Self-pay | Admitting: Adult Health

## 2020-06-06 ENCOUNTER — Encounter: Payer: Self-pay | Admitting: Adult Health

## 2020-06-06 VITALS — BP 110/80 | HR 101 | Temp 97.3°F | Resp 101 | Wt 137.4 lb

## 2020-06-06 DIAGNOSIS — H60501 Unspecified acute noninfective otitis externa, right ear: Secondary | ICD-10-CM | POA: Insufficient documentation

## 2020-06-06 DIAGNOSIS — F419 Anxiety disorder, unspecified: Secondary | ICD-10-CM

## 2020-06-06 DIAGNOSIS — F32A Depression, unspecified: Secondary | ICD-10-CM | POA: Insufficient documentation

## 2020-06-06 DIAGNOSIS — F329 Major depressive disorder, single episode, unspecified: Secondary | ICD-10-CM

## 2020-06-06 DIAGNOSIS — F321 Major depressive disorder, single episode, moderate: Secondary | ICD-10-CM

## 2020-06-06 DIAGNOSIS — H6504 Acute serous otitis media, recurrent, right ear: Secondary | ICD-10-CM

## 2020-06-06 MED ORDER — NEOMYCIN-POLYMYXIN-HC 3.5-10000-1 OT SOLN: 3.0000 [drp] | Freq: Four times a day (QID) | OTIC | 0 refills | Status: DC

## 2020-06-06 MED ORDER — BUPROPION HCL ER (SR) 150 MG PO TB12: 150.0000 mg | ORAL_TABLET | Freq: Every day | ORAL | 0 refills | Status: DC

## 2020-06-06 MED ORDER — CEFDINIR 300 MG PO CAPS: 300.0000 mg | ORAL_CAPSULE | Freq: Two times a day (BID) | ORAL | 0 refills | Status: DC

## 2020-06-06 NOTE — Patient Instructions (Signed)
Persistent Depressive Disorder  Persistent depressive disorder (PDD) is a mental health condition. PDD causes symptoms of low-level depression for 2 years or longer. It may also be called long-term (chronic) depression or dysthymia. PDD may include episodes of more severe depression that last for about 2 weeks (major depressive disorder or MDD). PDD can affect the way you think, feel, and sleep. This condition may also affect your relationships. You may be more likely to get sick if you have PDD. Symptoms of PDD occur for most of the day and may include:  Feeling tired (fatigue).  Low energy.  Eating too much or too little.  Sleeping too much or too little.  Feeling restless or agitated.  Feeling hopeless.  Feeling worthless or guilty.  Feeling worried or nervous (anxiety).  Trouble concentrating or making decisions.  Low self-esteem.  A negative way of looking at things (outlook).  Not being able to have fun or feel pleasure.  Avoiding interacting with people.  Getting angry or annoyed easily (irritability).  Acting aggressive or angry. Follow these instructions at home: Activity  Go back to your normal activities as told by your doctor.  Exercise regularly as told by your doctor. General instructions  Take over-the-counter and prescription medicines only as told by your doctor.  Do not drink alcohol. Or, limit how much alcohol you drink to no more than 1 drink a day for nonpregnant women and 2 drinks a day for men. One drink equals 12 oz of beer, 5 oz of wine, or 1 oz of hard liquor. Alcohol can affect any antidepressant medicines you are taking. Talk with your doctor about your alcohol use.  Eat a healthy diet and get plenty of sleep.  Find activities that you enjoy each day.  Consider joining a support group. Your doctor may be able to suggest a support group.  Keep all follow-up visits as told by your doctor. This is important. Where to find more  information [employer] on Mental Illness  www.nami.org [place] National Institute of Mental Health  https://carter.com/ National Suicide Prevention Lifeline  662-770-6512).  This is free, 24-hour help. Contact a doctor if:  Your symptoms get worse.  You have new symptoms.  You have trouble sleeping or doing your daily activities. Get help right away if:  You self-harm.  You have serious thoughts about hurting yourself or others.  You see, hear, taste, smell, or feel things that are not there (hallucinate). This information is not intended to replace advice given to you by your health care provider. Make sure you discuss any questions you have with your health care provider. Document Revised: 10/08/2017 Document Reviewed: 06/19/2016 Elsevier Patient Education  [place]. Bupropion tablets (Depression/Mood Disorders) What is this medicine? BUPROPION (byoo PROE pee on) is used to treat depression. This medicine may be used for other purposes; ask your health care provider or pharmacist if you have questions. COMMON BRAND NAME(S): Wellbutrin What should I tell my health care provider before I take this medicine? They need to know if you have any of these conditions:  an eating disorder, such as anorexia or bulimia  bipolar disorder or psychosis  diabetes or high blood sugar, treated with medication  glaucoma  heart disease, previous heart attack, or irregular heart beat  head injury or brain tumor  high blood pressure  kidney or liver disease  seizures  suicidal thoughts or a previous suicide attempt  Tourette's syndrome  weight loss  an unusual or allergic reaction to bupropion,  other medicines, foods, dyes, or preservatives  breast-feeding  pregnant or trying to become pregnant How should I use this medicine? Take this medicine by mouth with a glass of water. Follow the directions on the prescription label. You can take it with or without  food. If it upsets your stomach, take it with food. Take your medicine at regular intervals. Do not take your medicine more often than directed. Do not stop taking this medicine suddenly except upon the advice of your doctor. Stopping this medicine too quickly may cause serious side effects or your condition may worsen. A special MedGuide will be given to you by the pharmacist with each prescription and refill. Be sure to read this information carefully each time. Talk to your pediatrician regarding the use of this medicine in children. Special care may be needed. Overdosage: If you think you have taken too much of this medicine contact a poison control center or emergency room at once. NOTE: This medicine is only for you. Do not share this medicine with others. What if I miss a dose? If you miss a dose, take it as soon as you can. If it is less than four hours to your next dose, take only that dose and skip the missed dose. Do not take double or extra doses. What may interact with this medicine? Do not take this medicine with any of the following medications:  linezolid  MAOIs like Azilect, Carbex, Eldepryl, Marplan, Nardil, and Parnate  methylene blue (injected into a vein)  other medicines that contain bupropion like Zyban This medicine may also interact with the following medications:  alcohol  certain medicines for anxiety or sleep  certain medicines for blood pressure like metoprolol, propranolol  certain medicines for depression or psychotic disturbances  certain medicines for HIV or AIDS like efavirenz, lopinavir, nelfinavir, ritonavir  certain medicines for irregular heart beat like propafenone, flecainide  certain medicines for Parkinson's disease like amantadine, levodopa  certain medicines for seizures like carbamazepine, phenytoin,  phenobarbital  cimetidine  clopidogrel  cyclophosphamide  digoxin  furazolidone  isoniazid  nicotine  orphenadrine  procarbazine  steroid medicines like prednisone or cortisone  stimulant medicines for attention disorders, weight loss, or to stay awake  tamoxifen  theophylline  thiotepa  ticlopidine  tramadol  warfarin This list may not describe all possible interactions. Give your health care provider a list of all the medicines, herbs, non-prescription drugs, or dietary supplements you use. Also tell them if you smoke, drink alcohol, or use illegal drugs. Some items may interact with your medicine. What should I watch for while using this medicine? Tell your doctor if your symptoms do not get better or if they get worse. Visit your doctor or healthcare provider for regular checks on your progress. Because it may take several weeks to see the full effects of this medicine, it is important to continue your treatment as prescribed by your doctor. This medicine may cause serious skin reactions. They can happen weeks to months after starting the medicine. Contact your healthcare provider right away if you notice fevers or flu-like symptoms with a rash. The rash may be red or purple and then turn into blisters or peeling of the skin. Or, you might notice a red rash with swelling of the face, lips or lymph nodes in your neck or under your arms. Patients and their families should watch out for new or worsening thoughts of suicide or depression. Also watch out for sudden changes in feelings such as feeling  anxious, agitated, panicky, irritable, hostile, aggressive, impulsive, severely restless, overly excited and hyperactive, or not being able to sleep. If this happens, especially at the beginning of treatment or after a change in dose, call your healthcare provider. Avoid alcoholic drinks while taking this medicine. Drinking excessive alcoholic beverages, using sleeping or anxiety  medicines, or quickly stopping the use of these agents while taking this medicine may increase your risk for a seizure. Do not drive or use heavy machinery until you know how this medicine affects you. This medicine can impair your ability to perform these tasks. Do not take this medicine close to bedtime. It may prevent you from sleeping. Your mouth may get dry. Chewing sugarless gum or sucking hard candy, and drinking plenty of water may help. Contact your doctor if the problem does not go away or is severe. What side effects may I notice from receiving this medicine? Side effects that you should report to your doctor or health care professional as soon as possible:  allergic reactions like skin rash, itching or hives, swelling of the face, lips, or tongue  breathing problems  changes in vision  confusion  elevated mood, decreased need for sleep, racing thoughts, impulsive behavior  fast or irregular heartbeat  hallucinations, loss of contact with reality  increased blood pressure  rash, fever, and swollen lymph nodes  redness, blistering, peeling, or loosening of the skin, including inside the mouth  seizures  suicidal thoughts or other mood changes  unusually weak or tired  vomiting Side effects that usually do not require medical attention (report to your doctor or health care professional if they continue or are bothersome):  constipation  headache  loss of appetite  nausea  tremors  weight loss This list may not describe all possible side effects. Call your doctor for medical advice about side effects. You may report side effects to FDA at 1-800-FDA-1088. Where should I keep my medicine? Keep out of the reach of children. Store at room temperature between 20 and 25 degrees C (68 and 77 degrees F), away from direct sunlight and moisture. Keep tightly closed. Throw away any unused medicine after the expiration date. NOTE: This sheet is a summary. It may not  cover all possible information. If you have questions about this medicine, talk to your doctor, pharmacist, or health care provider.  2020 Elsevier/Gold Standard (2019-01-19 14:02:47) Generalized Anxiety Disorder, Adult Generalized anxiety disorder (GAD) is a mental health disorder. People with this condition constantly worry about everyday events. Unlike normal anxiety, worry related to GAD is not triggered by a specific event. These worries also do not fade or get better with time. GAD interferes with life functions, including relationships, work, and school. GAD can vary from mild to severe. People with severe GAD can have intense waves of anxiety with physical symptoms (panic attacks). What are the causes? The exact cause of GAD is not known. What increases the risk? This condition is more likely to develop in:  Women.  People who have a family history of anxiety disorders.  People who are very shy.  People who experience very stressful life events, such as the death of a loved one.  People who have a very stressful family environment. What are the signs or symptoms? People with GAD often worry excessively about many things in their lives, such as their health and family. They may also be overly concerned about:  Doing well at work.  Being on time.  Natural disasters.  Friendships. Physical symptoms  of GAD include:  Fatigue.  Muscle tension or having muscle twitches.  Trembling or feeling shaky.  Being easily startled.  Feeling like your heart is pounding or racing.  Feeling out of breath or like you cannot take a deep breath.  Having trouble falling asleep or staying asleep.  Sweating.  Nausea, diarrhea, or irritable bowel syndrome (IBS).  Headaches.  Trouble concentrating or remembering facts.  Restlessness.  Irritability. How is this diagnosed? Your health care provider can diagnose GAD based on your symptoms and medical history. You will also have a  physical exam. The health care provider will ask specific questions about your symptoms, including how severe they are, when they started, and if they come and go. Your health care provider may ask you about your use of alcohol or drugs, including prescription medicines. Your health care provider may refer you to a mental health specialist for further evaluation. Your health care provider will do a thorough examination and may perform additional tests to rule out other possible causes of your symptoms. To be diagnosed with GAD, a person must have anxiety that:  Is out of his or her control.  Affects several different aspects of his or her life, such as work and relationships.  Causes distress that makes him or her unable to take part in normal activities.  Includes at least three physical symptoms of GAD, such as restlessness, fatigue, trouble concentrating, irritability, muscle tension, or sleep problems. Before your health care provider can confirm a diagnosis of GAD, these symptoms must be present more days than they are not, and they must last for six months or longer. How is this treated? The following therapies are usually used to treat GAD:  Medicine. Antidepressant medicine is usually prescribed for long-term daily control. Antianxiety medicines may be added in severe cases, especially when panic attacks occur.  Talk therapy (psychotherapy). Certain types of talk therapy can be helpful in treating GAD by providing support, education, and guidance. Options include: ? Cognitive behavioral therapy (CBT). People learn coping skills and techniques to ease their anxiety. They learn to identify unrealistic or negative thoughts and behaviors and to replace them with positive ones. ? Acceptance and commitment therapy (ACT). This treatment teaches people how to be mindful as a way to cope with unwanted thoughts and feelings. ? Biofeedback. This process trains you to manage your body's response  (physiological response) through breathing techniques and relaxation methods. You will work with a therapist while machines are used to monitor your physical symptoms.  Stress management techniques. These include yoga, meditation, and exercise. A mental health specialist can help determine which treatment is best for you. Some people see improvement with one type of therapy. However, other people require a combination of therapies. Follow these instructions at home:  Take over-the-counter and prescription medicines only as told by your health care provider.  Try to maintain a normal routine.  Try to anticipate stressful situations and allow extra time to manage them.  Practice any stress management or self-calming techniques as taught by your health care provider.  Do not punish yourself for setbacks or for not making progress.  Try to recognize your accomplishments, even if they are small.  Keep all follow-up visits as told by your health care provider. This is important. Contact a health care provider if:  Your symptoms do not get better.  Your symptoms get worse.  You have signs of depression, such as: ? A persistently sad, cranky, or irritable mood. ? Loss of  enjoyment in activities that used to bring you joy. ? Change in weight or eating. ? Changes in sleeping habits. ? Avoiding friends or family members. ? Loss of energy for normal tasks. ? Feelings of guilt or worthlessness. Get help right away if:  You have serious thoughts about hurting yourself or others. If you ever feel like you may hurt yourself or others, or have thoughts about taking your own life, get help right away. You can go to your nearest emergency department or call:  Your local emergency services (911 in the [place]  A suicide crisis helpline, such as the [facility] at (306)510-4866. This is open 24 hours a day. Summary  Generalized anxiety disorder (GAD) is a mental health  disorder that involves worry that is not triggered by a specific event.  People with GAD often worry excessively about many things in their lives, such as their health and family.  GAD may cause physical symptoms such as restlessness, trouble concentrating, sleep problems, frequent sweating, nausea, diarrhea, headaches, and trembling or muscle twitching.  A mental health specialist can help determine which treatment is best for you. Some people see improvement with one type of therapy. However, other people require a combination of therapies. This information is not intended to replace advice given to you by your health care provider. Make sure you discuss any questions you have with your health care provider. Document Revised: 10/08/2017 Document Reviewed: 09/15/2016 Elsevier Patient Education  [place]. Otitis Externa  Otitis externa is an infection of the outer ear canal. The outer ear canal is the area between the outside of the ear and the eardrum. Otitis externa is sometimes called swimmer's ear. What are the causes? Common causes of this condition include:  Swimming in dirty water.  Moisture in the ear.  An injury to the inside of the ear.  An object stuck in the ear.  A cut or scrape on the outside of the ear. What increases the risk? You are more likely to get this condition if you go swimming often. What are the signs or symptoms?  Itching in the ear. This is often the first symptom.  Swelling of the ear.  Redness in the ear.  Ear pain. The pain may get worse when you pull on your ear.  Pus coming from the ear. How is this treated? This condition may be treated with:  Antibiotic ear drops. These are often given for 10-14 days.  Medicines to reduce itching and swelling. Follow these instructions at home:  If you were given antibiotic ear drops, use them as told by your doctor. Do not stop using them even if your condition gets better.  Take  over-the-counter and prescription medicines only as told by your doctor.  Avoid getting water in your ears as told by your doctor. You may be told to avoid swimming or water sports for a few days.  Keep all follow-up visits as told by your doctor. This is important. How is this prevented?  Keep your ears dry. Use the corner of a towel to dry your ears after you swim or bathe.  Try not to scratch or put things in your ear. Doing these things makes it easier for germs to grow in your ear.  Avoid swimming in lakes, dirty water, or pools that may not have the right amount of a chemical called chlorine. Contact a doctor if:  You have a fever.  Your ear is still red, swollen, or painful  after 3 days.  You still have pus coming from your ear after 3 days.  Your redness, swelling, or pain gets worse.  You have a really bad headache.  You have redness, swelling, pain, or tenderness behind your ear. Summary  Otitis externa is an infection of the outer ear canal.  Symptoms include pain, redness, and swelling of the ear.  If you were given antibiotic ear drops, use them as told by your doctor. Do not stop using them even if your condition gets better.  Try not to scratch or put things in your ear. This information is not intended to replace advice given to you by your health care provider. Make sure you discuss any questions you have with your health care provider. Document Revised: 04/01/2018 Document Reviewed: 04/01/2018 Elsevier Patient Education  [place]. Otitis Media, Adult  Otitis media means that the middle ear is red and swollen (inflamed) and full of fluid. The condition usually goes away on its own. Follow these instructions at home:  Take over-the-counter and prescription medicines only as told by your doctor.  If you were prescribed an antibiotic medicine, take it as told by your doctor. Do not stop taking the antibiotic even if you start to feel better.  Keep  all follow-up visits as told by your doctor. This is important. Contact a doctor if:  You have bleeding from your nose.  There is a lump on your neck.  You are not getting better in 5 days.  You feel worse instead of better. Get help right away if:  You have pain that is not helped with medicine.  You have swelling, redness, or pain around your ear.  You get a stiff neck.  You cannot move part of your face (paralyzed).  You notice that the bone behind your ear hurts when you touch it.  You get a very bad headache. Summary  Otitis media means that the middle ear is red, swollen, and full of fluid.  This condition usually goes away on its own. In some cases, treatment may be needed.  If you were prescribed an antibiotic medicine, take it as told by your doctor. This information is not intended to replace advice given to you by your health care provider. Make sure you discuss any questions you have with your health care provider. Document Revised: 10/08/2017 Document Reviewed: 11/16/2016 Elsevier Patient Education  2020 [employer].

## 2020-06-06 NOTE — Progress Notes (Signed)
Established patient visit   Patient: Isabel [name]   DOB: 03/26/1991   29 y.o. Female  MRN: [id number] Visit Date: 06/06/2020  Today's healthcare provider: Marcille Buffy, FNP   Chief Complaint  Patient presents with  . Facial Pain   Subjective    Sinus Problem This is a new problem. The current episode started in the past 7 days. The problem is unchanged. There has been no fever. Associated symptoms include ear pain. Pertinent negatives include no chills, congestion, coughing, diaphoresis, headaches, hoarse voice, neck pain, shortness of breath, sinus pressure, sneezing, sore throat or swollen glands. (Patient reports pain on the right side of temple radiating to the back of her right eye) Past treatments include acetaminophen. The treatment provided no relief.    Sinus pressure eye/ forehead/ eye and pain in ear.   Denies any trauma. Ear has been wet in it at times - right ear only. Denies any hearing.  Not taking Flonase not everday as needed.  Zyrtec everyday  Denies any ill exposures.   Onset 7 days of right sided facial pain and ear pain.  No LMP recorded. (Menstrual status: IUD).  Denies any chance of pregnancy.  Patient does report that her mother was getting come with her to the visit today because her mother is concerned that she is depressed.  Patient has failed Lexapro and Zoloft in the past did not like the way it made her feel she has not yet tried Atarax that was prescribed.  She is willing to try Wellbutrin.  Recommended psychiatry counseling.  Denies any suicidal or homicidal thoughts or intents.   Patient  denies any fever, body aches,chills, rash, chest pain, shortness of breath, nausea, vomiting, or diarrhea.  Denies dizziness, lightheadedness, pre syncopal or syncopal episodes.   Allergies  Allergen Reactions  . Amoxicillin Hives  . Penicillins Hives   She has done well with cefdinir in the past no allergic reactions.  Denies any  complications with cefdinir ever.  She had hives with amoxicillin/insulin only.  Patient Active Problem List   Diagnosis Date Noted  . Acute otitis externa of right ear 06/06/2020  . Moderate depressive disorder 06/06/2020  . Sorethroat 04/15/2020  . Seasonal allergies 04/11/2020  . Recurrent acute serous otitis media of right ear 04/11/2020  . Right ear pain 04/11/2020  . 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 Anxiety 03/14/2020  . RUQ pain 03/14/2020  . Periumbilical abdominal pain 03/14/2020  . History of laparoscopic cholecystectomy 03/14/2020  . Right lower quadrant abdominal pain 03/14/2020  . Corpus luteum cyst of right ovary 03/14/2020  . Umbilical hernia without obstruction or gangrene 03/14/2020  . Sensation of chest pressure 02/26/2020  . Shortness of breath 02/26/2020  . Genetic testing   . Monoallelic mutation of PALB2 gene   . Family history of breast cancer in mother   . 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/24/2016  . Positive urine drug screen 03/27/2016  . Tobacco user 02/26/2016  . Irregular periods/menstrual cycles 02/26/2016  . Neck pain 02/22/2012  . Epigastric abdominal pain 09/25/2008  . LOW BACK PAIN 08/28/2008  . VASOVAGAL SYNCOPE 08/28/2008   Past Medical History:  Diagnosis Date  . Aberrant right subclavian artery   . Anxiety   . Dysmenorrhea   . Family history of breast cancer in mother   . Genetic testing 04/25/18   PALB2 analysis @ [facility] - Familial pathogenic PALB2 mutation detected  . Migraines   .  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 CHOLECYSTECTOMY     Social History   Tobacco Use  . Smoking status: Current Every Day Smoker    Packs/day: 0.50    Types: Cigarettes  . Smokeless tobacco: Never Used  Vaping Use  . Vaping Use: Former  Substance Use Topics  . Alcohol use: No  . Drug  use: Yes    Types: Marijuana   Social History   Socioeconomic History  . Marital status: Single    Spouse name: Not on file  . Number of children: Not on file  . Years of education: 62  . Highest education level: Not on file  Occupational History  . Not on file  Tobacco Use  . Smoking status: Current Every Day Smoker    Packs/day: 0.50    Types: Cigarettes  . Smokeless tobacco: Never Used  Vaping Use  . Vaping Use: Former  Substance and Sexual Activity  . Alcohol use: No  . Drug use: Yes    Types: Marijuana  . Sexual activity: Yes    Birth control/protection: None, Condom, I.U.D.    Comment: Husband plans vasectomy   Other Topics Concern  . Not on file  Social History Narrative  . Not on file   Social Determinants of Health   Financial Resource Strain:   . Difficulty of Paying Living Expenses:   Food Insecurity:   . Worried About Charity fundraiser in the Last Year:   . Arboriculturist in the Last Year:   Transportation Needs:   . Film/video editor (Medical):   Marland Kitchen Lack of Transportation (Non-Medical):   Physical Activity:   . Days of Exercise per Week:   . Minutes of Exercise per Session:   Stress:   . Feeling of Stress :   Social Connections:   . Frequency of Communication with Friends and Family:   . Frequency of Social Gatherings with Friends and Family:   . Attends Religious Services:   . Active Member of Clubs or Organizations:   . Attends Archivist Meetings:   Marland Kitchen Marital Status:   Intimate Partner Violence:   . Fear of Current or Ex-Partner:   . Emotionally Abused:   Marland Kitchen Physically Abused:   . Sexually Abused:    Family Status  Relation Name Status  . Mother  Alive  . Father  Alive  . Other  Alive  . MGM  (Not Specified)  . MGF  Alive  . PGM  Deceased  . PGF  Alive  . Mat Aunt  (Not Specified)  . Neg Hx  (Not Specified)   Family History  Problem Relation Age of Onset  . Breast cancer Mother 74       currently 16  .  Hypertension Father   . Arthritis Other   . Diabetes Other   . Cancer Other   . Diabetes Maternal Grandmother   . Diabetes Maternal Grandfather   . Diabetes Paternal Grandmother   . Diabetes Paternal Grandfather   . Breast cancer Maternal Aunt 34       currently 61; PALB2 mutation  . Colon cancer Neg Hx   . Heart disease Neg Hx    Allergies  Allergen Reactions  . Amoxicillin Hives  . Penicillins Hives       Medications: Outpatient Medications Prior to Visit  Medication Sig  . gabapentin (NEURONTIN)  100 MG capsule TAKE 2 CAPSULES (200 MG TOTAL) BY MOUTH 2 (TWO) TIMES DAILY.  . cetirizine (ZYRTEC ALLERGY) 10 MG tablet Take 1 tablet (10 mg total) by mouth daily.  . fluticasone (FLONASE) 50 MCG/ACT nasal spray Place 2 sprays into both nostrils daily.  . hydrOXYzine (ATARAX/VISTARIL) 25 MG tablet Take 1 tablet (25 mg total) by mouth every 8 (eight) hours as needed. (Patient not taking: Reported on 06/06/2020)  . ibuprofen (ADVIL) 600 MG tablet Take 1 tablet (600 mg total) by mouth every 6 (six) hours as needed. (Patient not taking: Reported on 06/06/2020)  . levonorgestrel (MIRENA) 20 MCG/24HR IUD 1 each by Intrauterine route once.  . metroNIDAZOLE (FLAGYL) 500 MG tablet Take 1 tablet (500 mg total) by mouth 2 (two) times daily. (Patient not taking: Reported on 04/10/2020)  . pantoprazole (PROTONIX) 40 MG tablet Take 1 tablet (40 mg total) by mouth daily before breakfast. (Patient not taking: Reported on 06/06/2020)  . [DISCONTINUED] sertraline (ZOLOFT) 25 MG tablet Take 1 tablet (25 mg total) by mouth daily. (Patient not taking: Reported on 04/10/2020)   No facility-administered medications prior to visit.    Review of Systems  Constitutional: Negative.  Negative for chills and diaphoresis.  HENT: Positive for ear pain and sinus pain. Negative for congestion, hoarse voice, sinus pressure, sneezing and sore throat.   Eyes: Negative.   Respiratory: Negative.  Negative for cough and  shortness of breath.   Cardiovascular: Negative.   Gastrointestinal: Negative.   Genitourinary: Negative.   Musculoskeletal: Negative.  Negative for neck pain.  Skin: Negative.   Neurological: Negative.  Negative for headaches.  Hematological: Negative.   Psychiatric/Behavioral: Positive for dysphoric mood. Negative for agitation, behavioral problems, confusion, decreased concentration, hallucinations, self-injury, sleep disturbance and suicidal ideas. The patient is nervous/anxious. The patient is not hyperactive.     Last CBC Lab Results  Component Value Date   WBC 10.2 02/26/2020   HGB 15.5 (H) 02/26/2020   HCT 43.5 02/26/2020   MCV 90.2 02/26/2020   MCH 32.2 02/26/2020   RDW 12.7 02/26/2020   PLT 231 70/96/2836   Last metabolic panel Lab Results  Component Value Date   GLUCOSE 92 02/26/2020   NA 138 02/26/2020   K 4.4 02/26/2020   CL 104 02/26/2020   CO2 22 02/26/2020   BUN 13 02/26/2020   CREATININE 0.80 02/26/2020   GFRNONAA >60 02/26/2020   GFRAA >60 02/26/2020   CALCIUM 9.3 02/26/2020   PROT 7.3 02/26/2020   ALBUMIN 4.2 02/26/2020   LABGLOB 1.9 07/19/2019   AGRATIO 2.3 (H) 07/19/2019   BILITOT 1.0 02/26/2020   ALKPHOS 48 02/26/2020   AST 14 (L) 02/26/2020   ALT 13 02/26/2020   ANIONGAP 12 02/26/2020   Last lipids Lab Results  Component Value Date   CHOL 209 (H) 02/26/2020   HDL 33 (L) 02/26/2020   LDLCALC 160 (H) 02/26/2020   TRIG 81 02/26/2020   CHOLHDL 6.3 02/26/2020   Last hemoglobin A1c No results found for: HGBA1C Last thyroid functions Lab Results  Component Value Date   TSH 1.338 02/26/2020   Last vitamin D No results found for: 25OHVITD2, 25OHVITD3, VD25OH Last vitamin B12 and Folate No results found for: VITAMINB12, FOLATE    Objective    BP 110/80   Pulse 101   Temp (!) 97.3 F (36.3 C) (Oral)   Resp (!) 101   Wt 137 lb 6.4 oz (62.3 kg)   SpO2 99%   BMI 23.58 kg/m  BP  Readings from Last 3 Encounters:  06/06/20 110/80    04/23/20 114/81  04/18/20 117/83      Physical Exam Vitals reviewed.  Constitutional:      General: She is not in acute distress.    Appearance: Normal appearance. She is normal weight. She is not ill-appearing, toxic-appearing or diaphoretic.     Comments: Patient is alert and oriented and responsive to questions Engages in eye contact with provider. Speaks in full sentences without any pauses without any shortness of breath or distress.    HENT:     Head: Normocephalic and atraumatic.     Right Ear: Hearing and external ear normal. Drainage (swollen wet ear canal ) present. A middle ear effusion is present. There is no impacted cerumen. Tympanic membrane is erythematous.     Left Ear: Hearing, ear canal and external ear normal. A middle ear effusion is present. There is no impacted cerumen. Tympanic membrane is erythematous.     Nose: Nose normal. No congestion or rhinorrhea.     Mouth/Throat:     Mouth: Mucous membranes are moist.     Pharynx: No oropharyngeal exudate or posterior oropharyngeal erythema.  Eyes:     General: No scleral icterus.       Right eye: No discharge.        Left eye: No discharge.     Extraocular Movements: Extraocular movements intact.     Conjunctiva/sclera: Conjunctivae normal.     Pupils: Pupils are equal, round, and reactive to light.  Neck:     Vascular: No carotid bruit.  Cardiovascular:     Rate and Rhythm: Normal rate and regular rhythm.     Pulses: Normal pulses.     Heart sounds: Normal heart sounds. No murmur heard.  No friction rub. No gallop.   Pulmonary:     Effort: Pulmonary effort is normal. No respiratory distress.     Breath sounds: Normal breath sounds. No stridor. No wheezing, rhonchi or rales.  Chest:     Chest wall: No tenderness.  Abdominal:     General: Bowel sounds are normal. There is no distension.     Palpations: Abdomen is soft.     Tenderness: There is no abdominal tenderness.  Musculoskeletal:     Cervical back:  Normal range of motion. No rigidity or tenderness.  Lymphadenopathy:     Cervical: No cervical adenopathy.  Neurological:     Mental Status: She is alert and oriented to person, place, and time.     Cranial Nerves: No cranial nerve deficit.     Motor: No weakness.     Coordination: Coordination normal.     Gait: Gait normal.     Deep Tendon Reflexes: Reflexes normal.  Psychiatric:        Behavior: Behavior normal.        Thought Content: Thought content normal.        Judgment: Judgment normal.      No results found for any visits on 06/06/20.  Assessment & Plan     Recurrent acute serous otitis media of right ear - Plan: CBC with Differential/Platelet, Comprehensive metabolic panel, cefdinir (OMNICEF) 300 MG capsule, buPROPion (WELLBUTRIN SR) 150 MG 12 hr tablet  Anxiety - Plan: CBC with Differential/Platelet, Comprehensive metabolic panel, buPROPion (WELLBUTRIN SR) 150 MG 12 hr tablet  Acute otitis externa of right ear, unspecified type  Moderate depressive disorder  Meds ordered this encounter  Medications  . cefdinir (OMNICEF) 300 MG capsule  Sig: Take 1 capsule (300 mg total) by mouth 2 (two) times daily.    Dispense:  20 capsule    Refill:  0  . buPROPion (WELLBUTRIN SR) 150 MG 12 hr tablet    Sig: Take 1 tablet (150 mg total) by mouth daily.    Dispense:  30 tablet    Refill:  0  . neomycin-polymyxin-hydrocortisone (CORTISPORIN) OTIC solution    Sig: Place 3 drops into the right ear 4 (four) times daily.    Dispense:  10 mL    Refill:  0   She has failed SSRI therapy in the past, she has been hesitant to try Atarax.  Discussed with patient Wellbutrin and side effects.  Her mother takes Wellbutrin and she feels comfortable with this would like to try low-dose Wellbutrin we will do Wellbutrin 150 mg SR tablet with the thoughts of increasing to 300 mg total 150 mg twice a day if she does well on it.  She has a self-pay and extended release tablet is more expensive so  she prefers the SR tablet. Discussed the efficacy and of waiting 4 to 6 weeks to expect any response.  Discussed known black box warning for anti depression/ anxiety medication. Need to report any behavioral changes right, if any homicidal or suicidal thoughts or ideas seek medical attention right away. Call 911.   Recommend psychiatry/counseling patient declined at this time.   Return in about 1 month (around 07/07/2020), or if symptoms worsen or fail to improve, for at any time for any worsening symptoms, Go to Emergency room/ urgent care if worse.      I[place] Katiejo Gilroy, FNP, have reviewed all documentation for this visit. The documentation on 06/06/20 for the exam, diagnosis, procedures, and orders are all accurate and complete.    Marcille Buffy, [place] [phone] (phone) [phone] (fax)  [place]

## 2020-06-07 ENCOUNTER — Ambulatory Visit: Payer: Medicaid Other | Admitting: Neurology

## 2020-06-07 ENCOUNTER — Encounter: Payer: Self-pay | Admitting: Adult Health

## 2020-06-07 LAB — COMPREHENSIVE METABOLIC PANEL
ALT: 9 IU/L (ref 0–32)
AST: 11 IU/L (ref 0–40)
Albumin/Globulin Ratio: 1.9 (ref 1.2–2.2)
Albumin: 4.4 g/dL (ref 3.9–5.0)
Alkaline Phosphatase: 64 IU/L (ref 48–121)
BUN/Creatinine Ratio: 8 — ABNORMAL LOW (ref 9–23)
BUN: 6 mg/dL (ref 6–20)
Bilirubin Total: 0.3 mg/dL (ref 0.0–1.2)
CO2: 25 mmol/L (ref 20–29)
Calcium: 9.4 mg/dL (ref 8.7–10.2)
Chloride: 102 mmol/L (ref 96–106)
Creatinine, Ser: 0.79 mg/dL (ref 0.57–1.00)
GFR calc Af Amer: 118 mL/min/{1.73_m2} (ref >59)
GFR calc non Af Amer: 102 mL/min/{1.73_m2} (ref >59)
Globulin, Total: 2.3 g/dL (ref 1.5–4.5)
Glucose: 79 mg/dL (ref 65–99)
Potassium: 4.4 mmol/L (ref 3.5–5.2)
Sodium: 138 mmol/L (ref 134–144)
Total Protein: 6.7 g/dL (ref 6.0–8.5)

## 2020-06-07 LAB — CBC WITH DIFFERENTIAL/PLATELET
Basophils Absolute: 0 10*3/uL (ref 0.0–0.2)
Basos: 0 %
EOS (ABSOLUTE): 0.2 10*3/uL (ref 0.0–0.4)
Eos: 2 %
Hematocrit: 45.1 % (ref 34.0–46.6)
Hemoglobin: 15.6 g/dL (ref 11.1–15.9)
Immature Grans (Abs): 0 10*3/uL (ref 0.0–0.1)
Immature Granulocytes: 0 %
Lymphocytes Absolute: 1.9 10*3/uL (ref 0.7–3.1)
Lymphs: 20 %
MCH: 31.8 pg (ref 26.6–33.0)
MCHC: 34.6 g/dL (ref 31.5–35.7)
MCV: 92 fL (ref 79–97)
Monocytes Absolute: 0.7 10*3/uL (ref 0.1–0.9)
Monocytes: 7 %
Neutrophils Absolute: 6.5 10*3/uL (ref 1.4–7.0)
Neutrophils: 71 %
Platelets: 252 10*3/uL (ref 150–450)
RBC: 4.9 x10E6/uL (ref 3.77–5.28)
RDW: 12.1 % (ref 11.7–15.4)
WBC: 9.2 10*3/uL (ref 3.4–10.8)

## 2020-06-07 NOTE — Progress Notes (Signed)
Labs within normal limits

## 2020-06-07 NOTE — Progress Notes (Signed)
Work note provided [employer].

## 2020-06-17 ENCOUNTER — Encounter: Payer: Self-pay | Admitting: Adult Health

## 2020-06-18 ENCOUNTER — Ambulatory Visit
Admission: RE | Admit: 2020-06-18 | Discharge: 2020-06-18 | Disposition: A | Discharge status: Home / Self Care | Payer: Medicaid Other | Source: Ambulatory Visit | Attending: Obstetrics and Gynecology | Admitting: Obstetrics and Gynecology

## 2020-06-18 ENCOUNTER — Other Ambulatory Visit: Payer: Self-pay | Admitting: Obstetrics and Gynecology

## 2020-06-18 ENCOUNTER — Other Ambulatory Visit: Payer: Self-pay

## 2020-06-18 ENCOUNTER — Telehealth: Payer: Self-pay | Admitting: Obstetrics and Gynecology

## 2020-06-18 ENCOUNTER — Other Ambulatory Visit: Payer: Self-pay | Admitting: Adult Health

## 2020-06-18 DIAGNOSIS — Z803 Family history of malignant neoplasm of breast: Secondary | ICD-10-CM | POA: Insufficient documentation

## 2020-06-18 DIAGNOSIS — N6310 Unspecified lump in the right breast, unspecified quadrant: Secondary | ICD-10-CM

## 2020-06-18 DIAGNOSIS — R921 Mammographic calcification found on diagnostic imaging of breast: Secondary | ICD-10-CM

## 2020-06-18 DIAGNOSIS — R928 Other abnormal and inconclusive findings on diagnostic imaging of breast: Secondary | ICD-10-CM

## 2020-06-18 IMAGING — US US BREAST*R* LIMITED INC AXILLA
1 series · 3 of 3 positions shown · non-contrast
Comparison: Previous exam(s).

CLINICAL DATA: Elevated family history of breast cancer. Mother
with history of breast cancer at the age of 38.

EXAM:
DIGITAL DIAGNOSTIC BILATERAL MAMMOGRAM WITH CAD AND TOMO
ULTRASOUND RIGHT BREAST

[Series 1: us breast*right* limited inc axilla · 0.06mm/px · 3 of 3 slices shown]
[im 1/3]
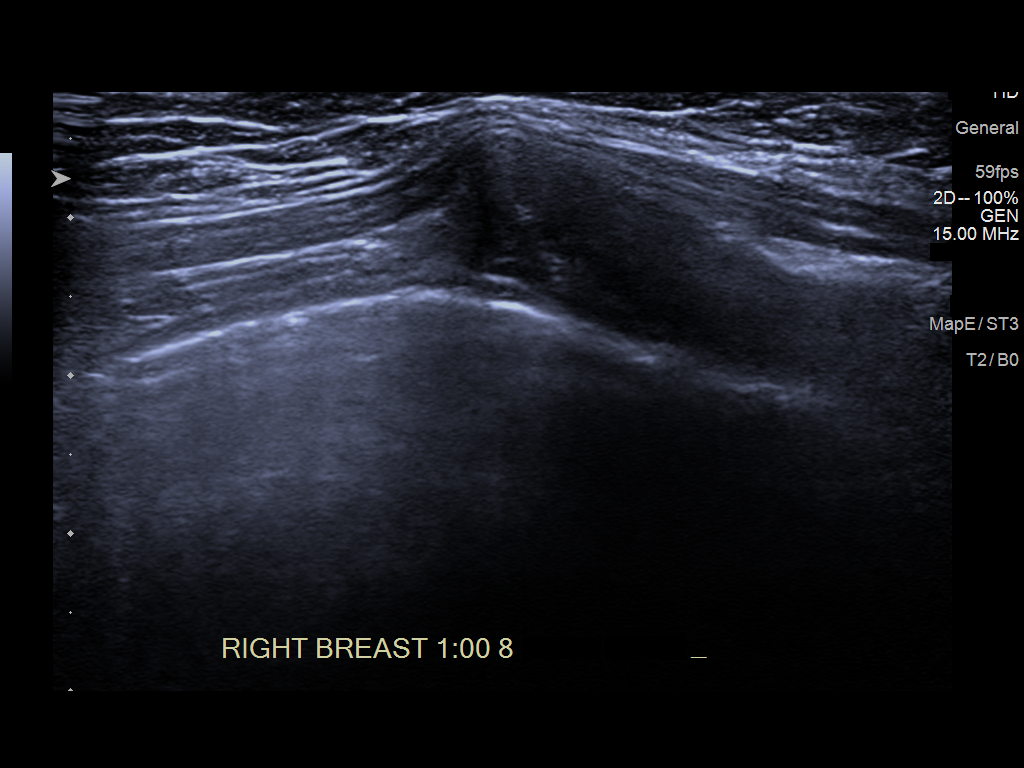
[im 2/3]
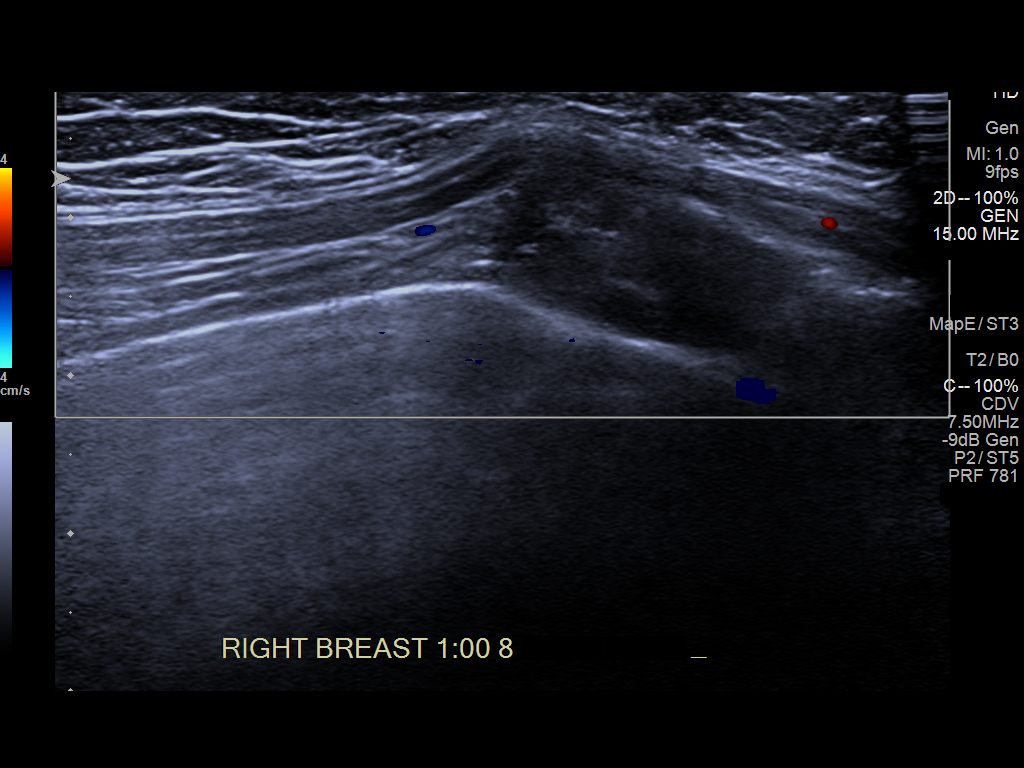
[im 3/3]
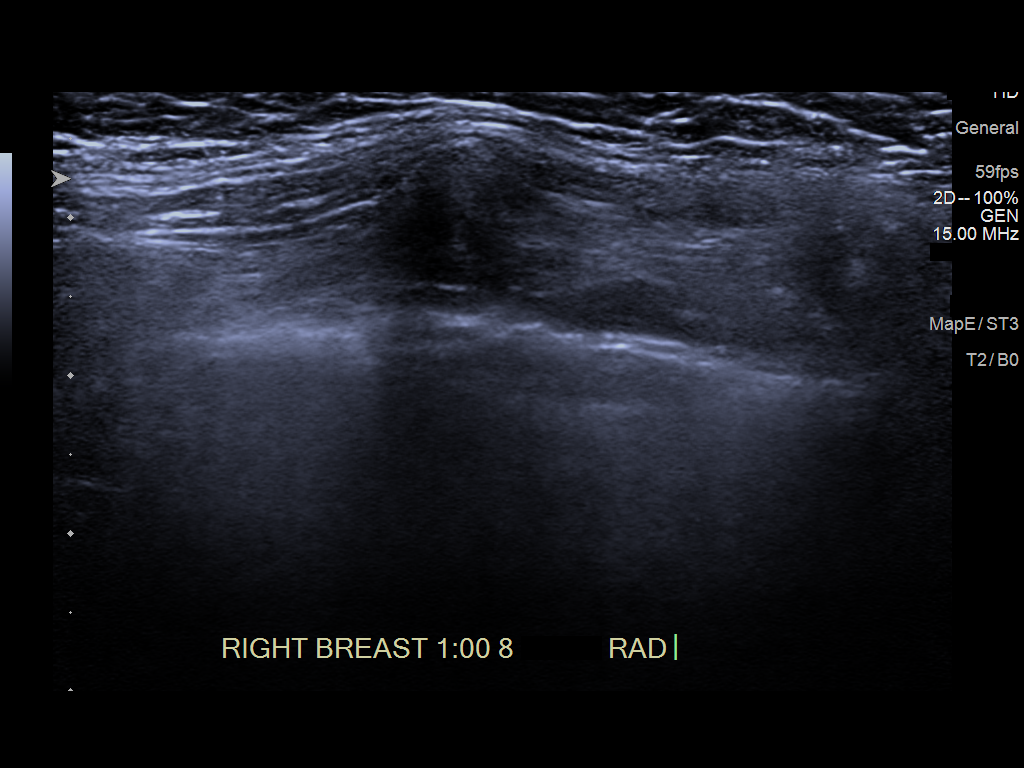

[3 of 3 positions shown; findings below may reference images not displayed]

ACR Breast Density Category c: The breast tissue is heterogeneously
dense, which may obscure small masses.
FINDINGS: In the RIGHT upper outer breast middle depth, there are
calcifications. Spot views demonstrate a 5 mm group of amorphous
calcifications at 11 o'clock.

Spot compression was obtained over the palpable area of concern in
the RIGHT breast. No suspicious mammographic finding is identified
in this area.

No suspicious mass, microcalcification, or other finding is
identified in contralateral breast.

Mammographic images were processed with CAD.

On physical exam, no suspicious mass is palpated. There is a
prominent ridge along the costosternal junction.

Targeted ultrasound was performed of the area of palpable concern in
the RIGHT breast. At the site of concern in the RIGHT upper breast
along the costosternal junction, there is no suspicious cystic or
solid mass. There is a prominent area of costal cartilage at the
site of palpable concern.
IMPRESSION: 1. There is a 5 mm group of indeterminate amorphous calcifications
in the RIGHT breast at 11 o'clock middle depth. Recommend
stereotactic guided biopsy.

2. No suspicious mammographic or sonographic abnormality at the site
of palpable concern in the RIGHT breast. There is a prominent area
of costosternal cartilage at the site of palpable concern.

3.  No mammographic evidence of malignancy in the LEFT breast.

RECOMMENDATION:
1. Stereotactic guided biopsy is recommended for the indeterminate
group of calcifications in RIGHT breast 11 o'clock middle depth.
Ordering provider will be notified and patient will be scheduled by
the breast schedulers at her earliest convenience.

2. The American Cancer Society recommends annual MRI and mammography
in patients with an estimated lifetime risk of developing breast
cancer greater than 20 - 25%, or who are known or suspected to be
positive for the breast cancer gene.

I have discussed the findings and recommendations with the patient.
If applicable, a reminder letter will be sent to the patient
regarding the next appointment.

BI-RADS CATEGORY  4: Suspicious.

## 2020-06-18 IMAGING — MG DIGITAL DIAGNOSTIC BILAT W/ TOMO W/ CAD
8 of 15 series · 8 of 39 positions shown · non-contrast
Comparison: Previous exam(s).

CLINICAL DATA: Elevated family history of breast cancer. Mother
with history of breast cancer at the age of 38.

EXAM:
DIGITAL DIAGNOSTIC BILATERAL MAMMOGRAM WITH CAD AND TOMO
ULTRASOUND RIGHT BREAST

[R ML (1 of 2)]
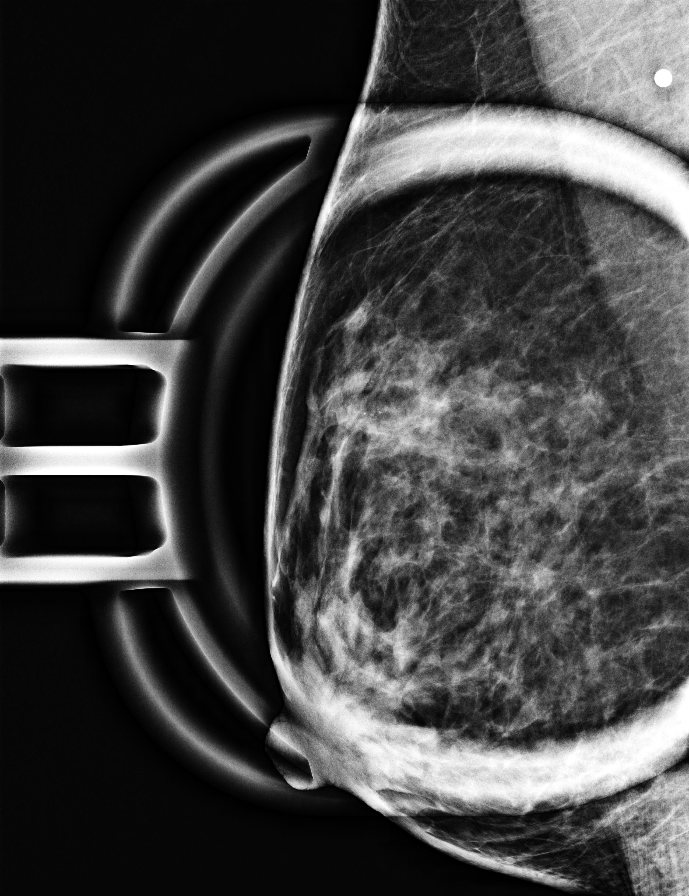

[R ML (2 of 2)]
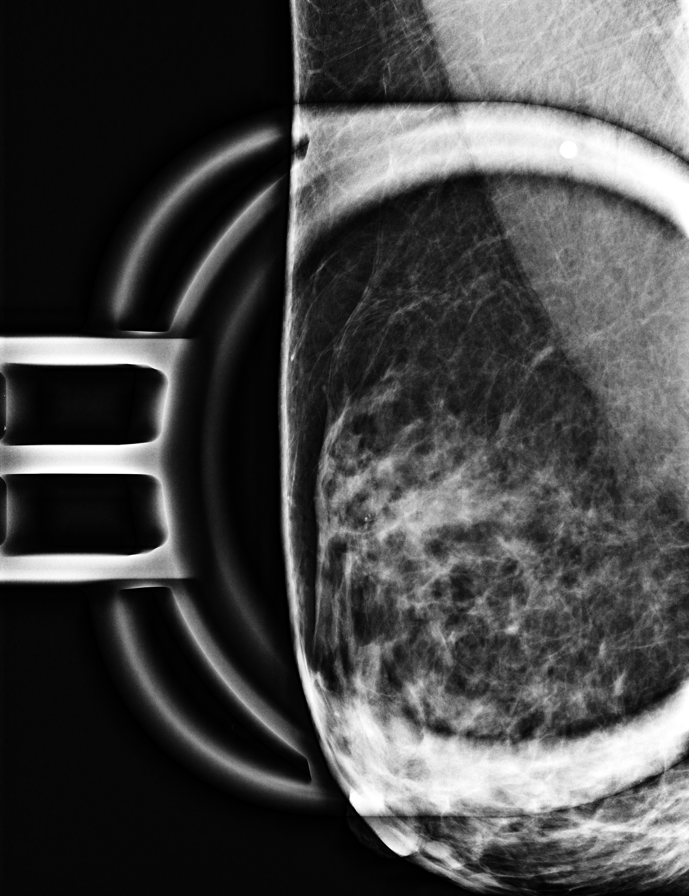

[R CC]
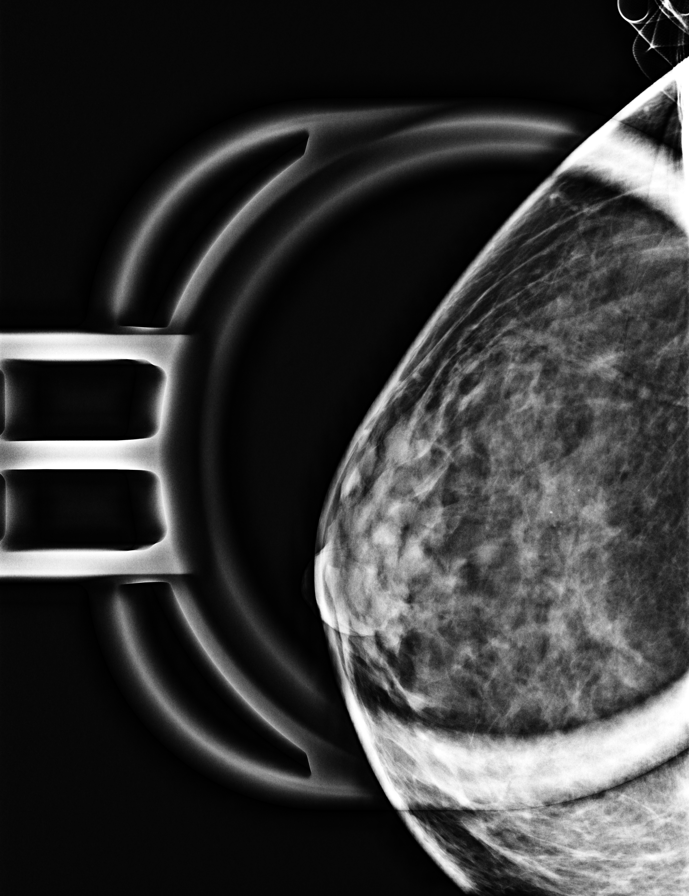

[L CC synth-2D]
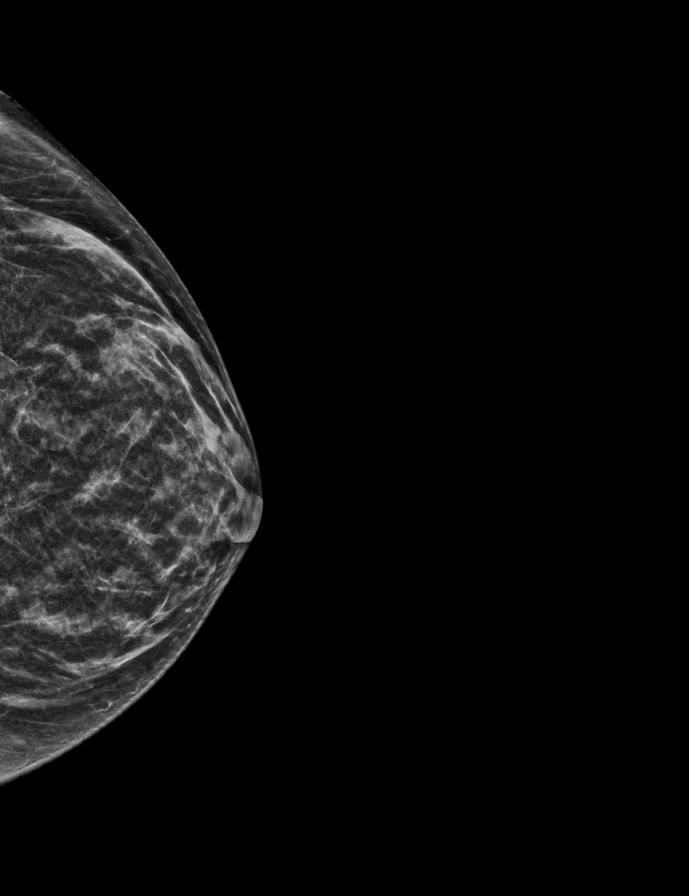

[R SIO synth-2D]
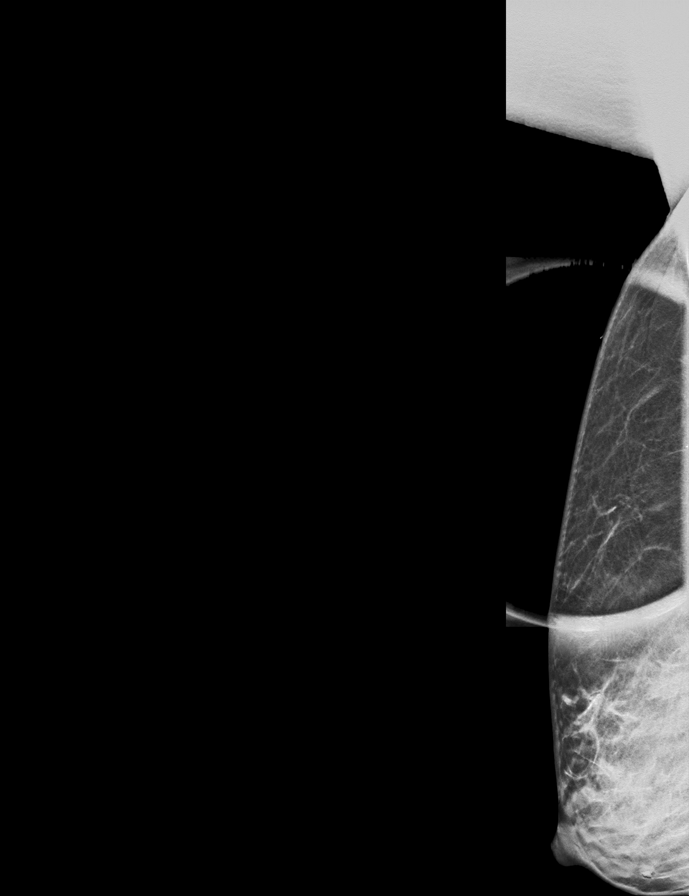

[R MLO synth-2D]
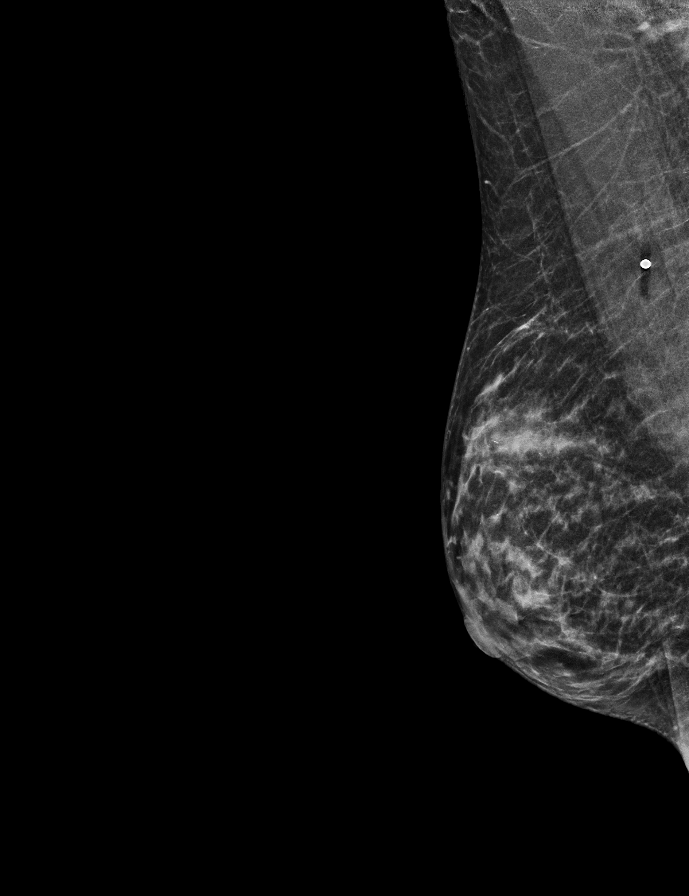

[R CC synth-2D]
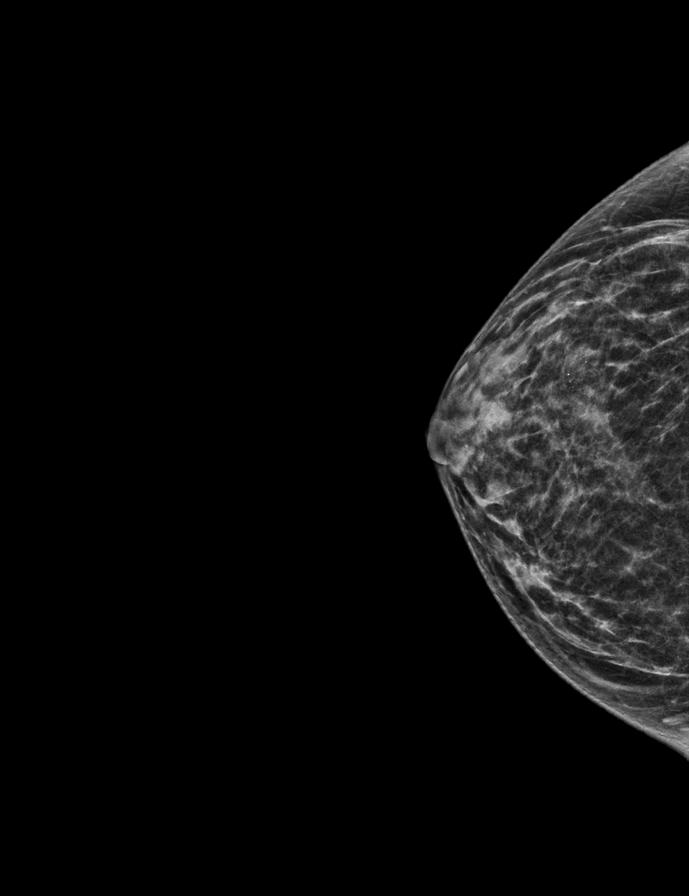

[R ML synth-2D]
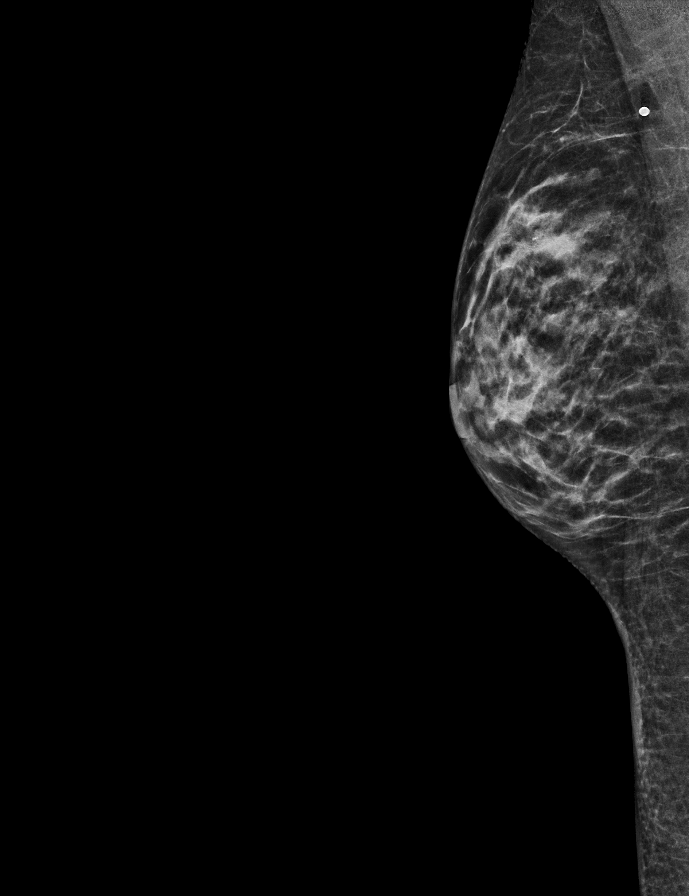

[8 of 39 positions shown; findings below may reference images not displayed]

ACR Breast Density Category c: The breast tissue is heterogeneously
dense, which may obscure small masses.
FINDINGS: In the RIGHT upper outer breast middle depth, there are
calcifications. Spot views demonstrate a 5 mm group of amorphous
calcifications at 11 o'clock.

Spot compression was obtained over the palpable area of concern in
the RIGHT breast. No suspicious mammographic finding is identified
in this area.

No suspicious mass, microcalcification, or other finding is
identified in contralateral breast.

Mammographic images were processed with CAD.

On physical exam, no suspicious mass is palpated. There is a
prominent ridge along the costosternal junction.

Targeted ultrasound was performed of the area of palpable concern in
the RIGHT breast. At the site of concern in the RIGHT upper breast
along the costosternal junction, there is no suspicious cystic or
solid mass. There is a prominent area of costal cartilage at the
site of palpable concern.
IMPRESSION: 1. There is a 5 mm group of indeterminate amorphous calcifications
in the RIGHT breast at 11 o'clock middle depth. Recommend
stereotactic guided biopsy.

2. No suspicious mammographic or sonographic abnormality at the site
of palpable concern in the RIGHT breast. There is a prominent area
of costosternal cartilage at the site of palpable concern.

3.  No mammographic evidence of malignancy in the LEFT breast.

RECOMMENDATION:
1. Stereotactic guided biopsy is recommended for the indeterminate
group of calcifications in RIGHT breast 11 o'clock middle depth.
Ordering provider will be notified and patient will be scheduled by
the breast schedulers at her earliest convenience.

2. The American Cancer Society recommends annual MRI and mammography
in patients with an estimated lifetime risk of developing breast
cancer greater than 20 - 25%, or who are known or suspected to be
positive for the breast cancer gene.

I have discussed the findings and recommendations with the patient.
If applicable, a reminder letter will be sent to the patient
regarding the next appointment.

BI-RADS CATEGORY  4: Suspicious.

## 2020-06-18 NOTE — Telephone Encounter (Signed)
Estill Bamberg from [facility] called in stating that they need a 'verbal' from Dr. Marcelline Mates regarding some additional things they would like to do with this patient since she has a lump in her right breast and family history of breast cancer. Estill Bamberg didn't go into specifics of what they were wanting to do with this patient.  Could you please advise?

## 2020-06-21 NOTE — Telephone Encounter (Signed)
Completed.

## 2020-06-28 ENCOUNTER — Other Ambulatory Visit: Payer: Self-pay | Admitting: Adult Health

## 2020-06-28 DIAGNOSIS — F419 Anxiety disorder, unspecified: Secondary | ICD-10-CM

## 2020-06-28 DIAGNOSIS — H6504 Acute serous otitis media, recurrent, right ear: Secondary | ICD-10-CM

## 2020-06-28 NOTE — Telephone Encounter (Signed)
   Notes to clinic:  REQUEST FOR 90 DAYS PRESCRIPTION. DX Code Needed.   Requested Prescriptions  Pending Prescriptions Disp Refills   buPROPion (WELLBUTRIN SR) 150 MG 12 hr tablet [Pharmacy Med Name: BUPROPION HCL SR 150 MG TABLET] 90 tablet 1    Sig: TAKE 1 TABLET BY MOUTH EVERY DAY      Psychiatry: Antidepressants - bupropion Passed - 06/28/2020  1:07 PM      Passed - Completed PHQ-2 or PHQ-9 in the last 360 days.      Passed - Last BP in normal range    BP Readings from Last 1 Encounters:  06/06/20 110/80          Passed - Valid encounter within last 6 months    Recent Outpatient Visits           3 weeks ago Recurrent acute serous otitis media of right ear   [facility] Flinchum, Kelby Aline, FNP   2 months ago Recurrent acute serous otitis media of right ear   [facility] Flinchum, Kelby Aline, FNP   3 months ago Aberrant right subclavian artery- noted on CT angiogram    [facility] Flinchum, Kelby Aline, FNP   4 months ago Epigastric abdominal pain   [place] Flinchum, Kelby Aline, FNP       Future Appointments             In 1 week Flinchum, Kelby Aline, [place], [place]   In [place], [place], [facility] [place]

## 2020-06-28 NOTE — Telephone Encounter (Signed)
Provider currently out of office, will refill for 30 days until provider comes back.

## 2020-07-01 ENCOUNTER — Ambulatory Visit (INDEPENDENT_AMBULATORY_CARE_PROVIDER_SITE_OTHER): Payer: No Typology Code available for payment source

## 2020-07-01 ENCOUNTER — Other Ambulatory Visit: Payer: Self-pay

## 2020-07-01 ENCOUNTER — Encounter (INDEPENDENT_AMBULATORY_CARE_PROVIDER_SITE_OTHER): Payer: Self-pay | Admitting: Vascular Surgery

## 2020-07-01 ENCOUNTER — Ambulatory Visit (INDEPENDENT_AMBULATORY_CARE_PROVIDER_SITE_OTHER): Payer: No Typology Code available for payment source | Admitting: Vascular Surgery

## 2020-07-01 VITALS — BP 109/80 | HR 115 | Resp 16 | Wt 130.8 lb

## 2020-07-01 DIAGNOSIS — I83813 Varicose veins of bilateral lower extremities with pain: Secondary | ICD-10-CM

## 2020-07-01 DIAGNOSIS — I872 Venous insufficiency (chronic) (peripheral): Secondary | ICD-10-CM

## 2020-07-03 ENCOUNTER — Encounter (INDEPENDENT_AMBULATORY_CARE_PROVIDER_SITE_OTHER): Payer: Self-pay | Admitting: Vascular Surgery

## 2020-07-03 DIAGNOSIS — I872 Venous insufficiency (chronic) (peripheral): Secondary | ICD-10-CM | POA: Insufficient documentation

## 2020-07-03 NOTE — Progress Notes (Signed)
MRN : [id number]  Isabel [name] is a 29 y.o. (08-Dec-1990) female who presents with chief complaint of  Chief Complaint  Patient presents with  . Follow-up    ultrasound follow up  .  History of Present Illness:   The patient returns for followup evaluation 3 months after the initial visit. The patient continues to have pain in the lower extremities with dependency right leg more so than the left. The pain is lessened with elevation. Graduated compression stockings, Class I (20-30 mmHg), have been worn but the stockings do not eliminate the leg pain. Over-the-counter analgesics do not improve the symptoms. The degree of discomfort continues to interfere with daily activities. The patient notes the pain in the legs is causing problems with daily exercise, at the workplace and even with household activities and maintenance such as standing in the kitchen preparing meals and doing dishes.   Venous ultrasound shows normal deep venous system, no evidence of acute or chronic DVT.  Superficial reflux is present in the right great saphenous vein  Current Meds  Medication Sig  . buPROPion (WELLBUTRIN SR) 150 MG 12 hr tablet TAKE 1 TABLET BY MOUTH EVERY DAY  . gabapentin (NEURONTIN) 100 MG capsule TAKE 2 CAPSULES (200 MG TOTAL) BY MOUTH 2 (TWO) TIMES DAILY.  Marland Kitchen levonorgestrel (MIRENA) 20 MCG/24HR IUD 1 each by Intrauterine route once.    Past Medical History:  Diagnosis Date  . Aberrant right subclavian artery   . Anxiety   . Dysmenorrhea   . Family history of breast cancer in mother   . Genetic testing 04/25/18   PALB2 analysis @ [facility] - Familial pathogenic PALB2 mutation detected  . Migraines   . 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 CHOLECYSTECTOMY      Social History Social History   Tobacco Use  . Smoking status: Current Every Day Smoker    Packs/day:  0.50    Types: Cigarettes  . Smokeless tobacco: Never Used  Vaping Use  . Vaping Use: Former  Substance Use Topics  . Alcohol use: No  . Drug use: Yes    Types: Marijuana    Family History Family History  Problem Relation Age of Onset  . Breast cancer Mother 33       currently 26  . Hypertension Father   . Arthritis Other   . Diabetes Other   . Cancer Other   . Diabetes Maternal Grandmother   . Diabetes Maternal Grandfather   . Diabetes Paternal Grandmother   . Diabetes Paternal Grandfather   . Breast cancer Maternal Aunt 21       currently 42; PALB2 mutation  . Colon cancer Neg Hx   . Heart disease Neg Hx     Allergies  Allergen Reactions  . Amoxicillin Hives  . Penicillins Hives     REVIEW OF SYSTEMS (Negative unless checked)  Constitutional: _0 Weight loss  _1 Fever  _2 Chills Cardiac: _3 Chest pain   _4 Chest pressure   _5 Palpitations   _6 Shortness of breath when laying flat   _7 Shortness of breath with exertion. Vascular:  _8 Pain in legs with walking   _9 Pain in legs at rest  _10 History of DVT   _11 Phlebitis   _12 Swelling in legs   _13 Varicose veins   _14 Non-healing ulcers Pulmonary:   _15 Uses home oxygen   _16 Productive cough   _17 Hemoptysis   _18 Wheeze  _19 COPD   _20 Asthma Neurologic:  _21   Dizziness   _0 Seizures   _1 History of stroke   _2 History of TIA  _3 Aphasia   _4 Vissual changes   _5 Weakness or numbness in arm   _6 Weakness or numbness in leg Musculoskeletal:   _7 Joint swelling   _8 Joint pain   _9 Low back pain Hematologic:  _10 Easy bruising  _11 Easy bleeding   _12 Hypercoagulable state   _13 Anemic Gastrointestinal:  _14 Diarrhea   _15 Vomiting  _16 Gastroesophageal reflux/heartburn   _17 Difficulty swallowing. Genitourinary:  _18 Chronic kidney disease   _19 Difficult urination  _20 Frequent urination   _21 Blood in urine Skin:  _22 Rashes   _23 Ulcers  Psychological:  _24 History of anxiety   _25  History of major depression.  Physical Examination  Vitals:   07/01/20 1517  BP: 109/80  Pulse:  (!) 115  Resp: 16  Weight: 130 lb 12.8 oz (59.3 kg)   Body mass index is 22.45 kg/m. Gen: WD/WN, NAD Head: [place]/AT, No temporalis wasting.  Ear/Nose/Throat: Hearing grossly intact, nares w/o erythema or drainage Eyes: PER, EOMI, sclera nonicteric.  Neck: Supple, no large masses.   Pulmonary:  Good air movement, no audible wheezing bilaterally, no use of accessory muscles.  Cardiac: RRR, no JVD Vascular: Large varicosities present extensively greater than 10 mm right leg.  Mild venous stasis changes to the legs bilaterally.  2+ soft pitting edema Vessel Right Left  Radial Palpable Palpable  Gastrointestinal: Non-distended. No guarding/no peritoneal signs.  Musculoskeletal: M/S 5/5 throughout.  No deformity or atrophy.  Neurologic: CN 2-12 intact. Symmetrical.  Speech is fluent. Motor exam as listed above. Psychiatric: Judgment intact, Mood & affect appropriate for pt's clinical situation. Dermatologic: No rashes or ulcers noted.  No changes consistent with cellulitis.  CBC Lab Results  Component Value Date   WBC 9.2 06/06/2020   HGB 15.6 06/06/2020   HCT 45.1 06/06/2020   MCV 92 06/06/2020   PLT 252 06/06/2020    BMET    Component Value Date/Time   NA 138 06/06/2020 1117   NA 139 12/02/2014 1042   K 4.4 06/06/2020 1117   K 4.1 12/02/2014 1042   CL 102 06/06/2020 1117   CL 106 12/02/2014 1042   CO2 25 06/06/2020 1117   CO2 27 12/02/2014 1042   GLUCOSE 79 06/06/2020 1117   GLUCOSE 92 02/26/2020 1333   GLUCOSE 109 (H) 12/02/2014 1042   BUN 6 06/06/2020 1117   BUN 7 12/02/2014 1042   CREATININE 0.79 06/06/2020 1117   CREATININE 0.89 12/02/2014 1042   CALCIUM 9.4 06/06/2020 1117   CALCIUM 8.8 12/02/2014 1042   GFRNONAA 102 06/06/2020 1117   GFRNONAA >60 12/02/2014 1042   GFRNONAA >60 07/18/2014 0756   GFRAA 118 06/06/2020 1117   GFRAA >60 12/02/2014 1042   GFRAA >60 07/18/2014 0756   CrCl cannot be calculated (Patient's most recent lab result is older than the  maximum 21 days allowed.).  COAG No results found for: INR, PROTIME  Radiology US BREAST LTD UNI RIGHT INC AXILLA  Result Date: 06/18/2020 CLINICAL DATA:  Elevated family history of breast cancer. Mother with history of breast cancer at the age of 79. EXAM: DIGITAL DIAGNOSTIC BILATERAL MAMMOGRAM WITH CAD AND TOMO ULTRASOUND RIGHT BREAST COMPARISON:  Previous exam(s). ACR Breast Density Category c: The breast tissue is heterogeneously dense, which may obscure small masses. FINDINGS: In the RIGHT upper outer breast middle depth, there are calcifications. Spot views demonstrate a 5 mm group of amorphous calcifications at 11 o'clock. Spot compression was obtained over the palpable area of concern in the RIGHT breast. No suspicious  mammographic finding is identified in this area. No suspicious mass, microcalcification, or other finding is identified in contralateral breast. Mammographic images were processed with CAD. On physical exam, no suspicious mass is palpated. There is a prominent ridge along the costosternal junction. Targeted ultrasound was performed of the area of palpable concern in the RIGHT breast. At the site of concern in the RIGHT upper breast along the costosternal junction, there is no suspicious cystic or solid mass. There is a prominent area of costal cartilage at the site of palpable concern. IMPRESSION: 1. There is a 5 mm group of indeterminate amorphous calcifications in the RIGHT breast at 11 o'clock middle depth. Recommend stereotactic guided biopsy. 2. No suspicious mammographic or sonographic abnormality at the site of palpable concern in the RIGHT breast. There is a prominent area of costosternal cartilage at the site of palpable concern. 3.  No mammographic evidence of malignancy in the LEFT breast. RECOMMENDATION: 1. Stereotactic guided biopsy is recommended for the indeterminate group of calcifications in RIGHT breast 11 o'clock middle depth. Ordering provider will be notified and  patient will be scheduled by the breast schedulers at her earliest convenience. 2. The American Cancer Society recommends annual MRI and mammography in patients with an estimated lifetime risk of developing breast cancer greater than 20 - 25%, or who are known or suspected to be positive for the breast cancer gene. I have discussed the findings and recommendations with the patient. If applicable, a reminder letter will be sent to the patient regarding the next appointment. BI-RADS CATEGORY  4: Suspicious. Electronically Signed   By: Valentino Saxon MD   On: 06/18/2020 14:29   MM DIAG BREAST TOMO BILATERAL  Result Date: 06/18/2020 CLINICAL DATA:  Elevated family history of breast cancer. Mother with history of breast cancer at the age of 69. EXAM: DIGITAL DIAGNOSTIC BILATERAL MAMMOGRAM WITH CAD AND TOMO ULTRASOUND RIGHT BREAST COMPARISON:  Previous exam(s). ACR Breast Density Category c: The breast tissue is heterogeneously dense, which may obscure small masses. FINDINGS: In the RIGHT upper outer breast middle depth, there are calcifications. Spot views demonstrate a 5 mm group of amorphous calcifications at 11 o'clock. Spot compression was obtained over the palpable area of concern in the RIGHT breast. No suspicious mammographic finding is identified in this area. No suspicious mass, microcalcification, or other finding is identified in contralateral breast. Mammographic images were processed with CAD. On physical exam, no suspicious mass is palpated. There is a prominent ridge along the costosternal junction. Targeted ultrasound was performed of the area of palpable concern in the RIGHT breast. At the site of concern in the RIGHT upper breast along the costosternal junction, there is no suspicious cystic or solid mass. There is a prominent area of costal cartilage at the site of palpable concern. IMPRESSION: 1. There is a 5 mm group of indeterminate amorphous calcifications in the RIGHT breast at 11 o'clock  middle depth. Recommend stereotactic guided biopsy. 2. No suspicious mammographic or sonographic abnormality at the site of palpable concern in the RIGHT breast. There is a prominent area of costosternal cartilage at the site of palpable concern. 3.  No mammographic evidence of malignancy in the LEFT breast. RECOMMENDATION: 1. Stereotactic guided biopsy is recommended for the indeterminate group of calcifications in RIGHT breast 11 o'clock middle depth. Ordering provider will be notified and patient will be scheduled by the breast schedulers at her earliest convenience. 2. The American Cancer Society recommends annual MRI and mammography in patients with an estimated lifetime  risk of developing breast cancer greater than 20 - 25%, or who are known or suspected to be positive for the breast cancer gene. I have discussed the findings and recommendations with the patient. If applicable, a reminder letter will be sent to the patient regarding the next appointment. BI-RADS CATEGORY  4: Suspicious. Electronically Signed   By: Valentino Saxon MD   On: 06/18/2020 14:29      Assessment/Plan 1. Varicose veins of bilateral lower extremities with pain Recommend  I have reviewed my previous  discussion with the patient regarding  varicose veins and why they cause symptoms. Patient will continue  wearing graduated compression stockings class 1 on a daily basis, beginning first thing in the morning and removing them in the evening.    In addition, behavioral modification including elevation during the day was again discussed and this will continue.  The patient has utilized over the counter pain medications and has been exercising.  However, at this time conservative therapy has not alleviated the patient's symptoms of leg pain and swelling  Recommend: laser ablation of the right great saphenous vein to eliminate the symptoms of pain and swelling of the lower extremities caused by the severe superficial venous  reflux disease.   2. Chronic venous insufficiency No surgery or intervention at this point in time.    I have had a long discussion with the patient regarding venous insufficiency and why it  causes symptoms. I have discussed with the patient the chronic skin changes that accompany venous insufficiency and the long term sequela such as infection and ulceration.  Patient will begin wearing graduated compression stockings class 1 (20-30 mmHg) or compression wraps on a daily basis a prescription was given. The patient will put the stockings on first thing in the morning and removing them in the evening. The patient is instructed specifically not to sleep in the stockings.    In addition, behavioral modification including several periods of elevation of the lower extremities during the day will be continued. I have demonstrated that proper elevation is a position with the ankles at heart level.  The patient is instructed to begin routine exercise, especially walking on a daily basis    Hortencia Pilar, MD  07/03/2020 11:38 AM

## 2020-07-04 ENCOUNTER — Other Ambulatory Visit: Payer: Self-pay

## 2020-07-04 ENCOUNTER — Ambulatory Visit
Admission: RE | Admit: 2020-07-04 | Discharge: 2020-07-04 | Disposition: A | Discharge status: Home / Self Care | Payer: Medicaid Other | Source: Ambulatory Visit | Attending: Obstetrics and Gynecology | Admitting: Obstetrics and Gynecology

## 2020-07-04 DIAGNOSIS — R928 Other abnormal and inconclusive findings on diagnostic imaging of breast: Secondary | ICD-10-CM | POA: Insufficient documentation

## 2020-07-04 DIAGNOSIS — R921 Mammographic calcification found on diagnostic imaging of breast: Secondary | ICD-10-CM

## 2020-07-04 HISTORY — PX: BREAST BIOPSY: SHX20

## 2020-07-04 IMAGING — MG MM BREAST LOCALIZATION CLIP
4 series · 4 of 12 positions shown · non-contrast
Comparison: Previous exam(s).

CLINICAL DATA: Evaluate biopsy marker

EXAM:
DIAGNOSTIC RIGHT MAMMOGRAM POST STEREOTACTIC BIOPSY

[R ML synth-2D]
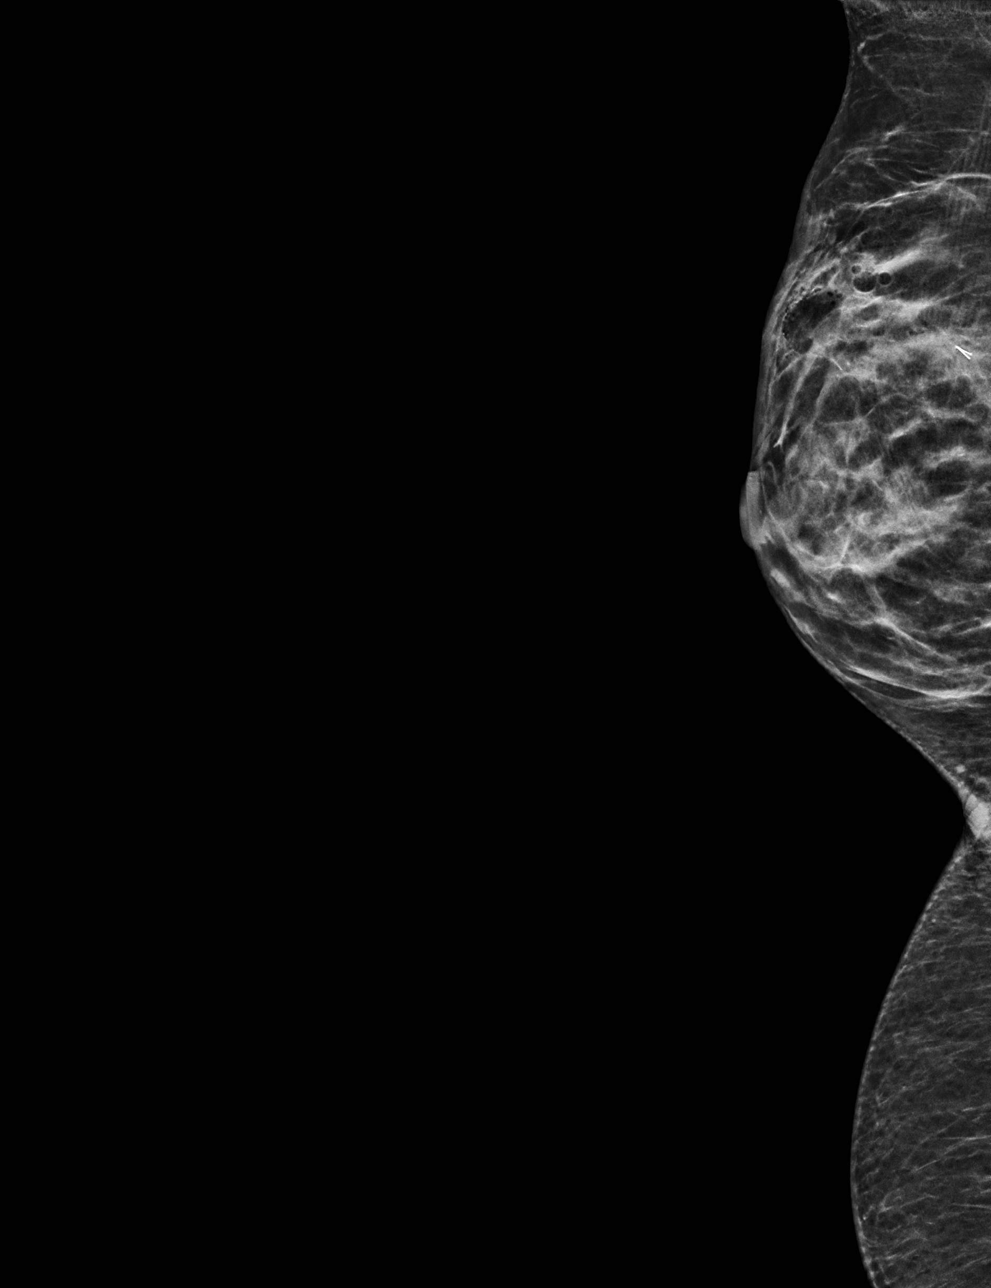

[R CC synth-2D]
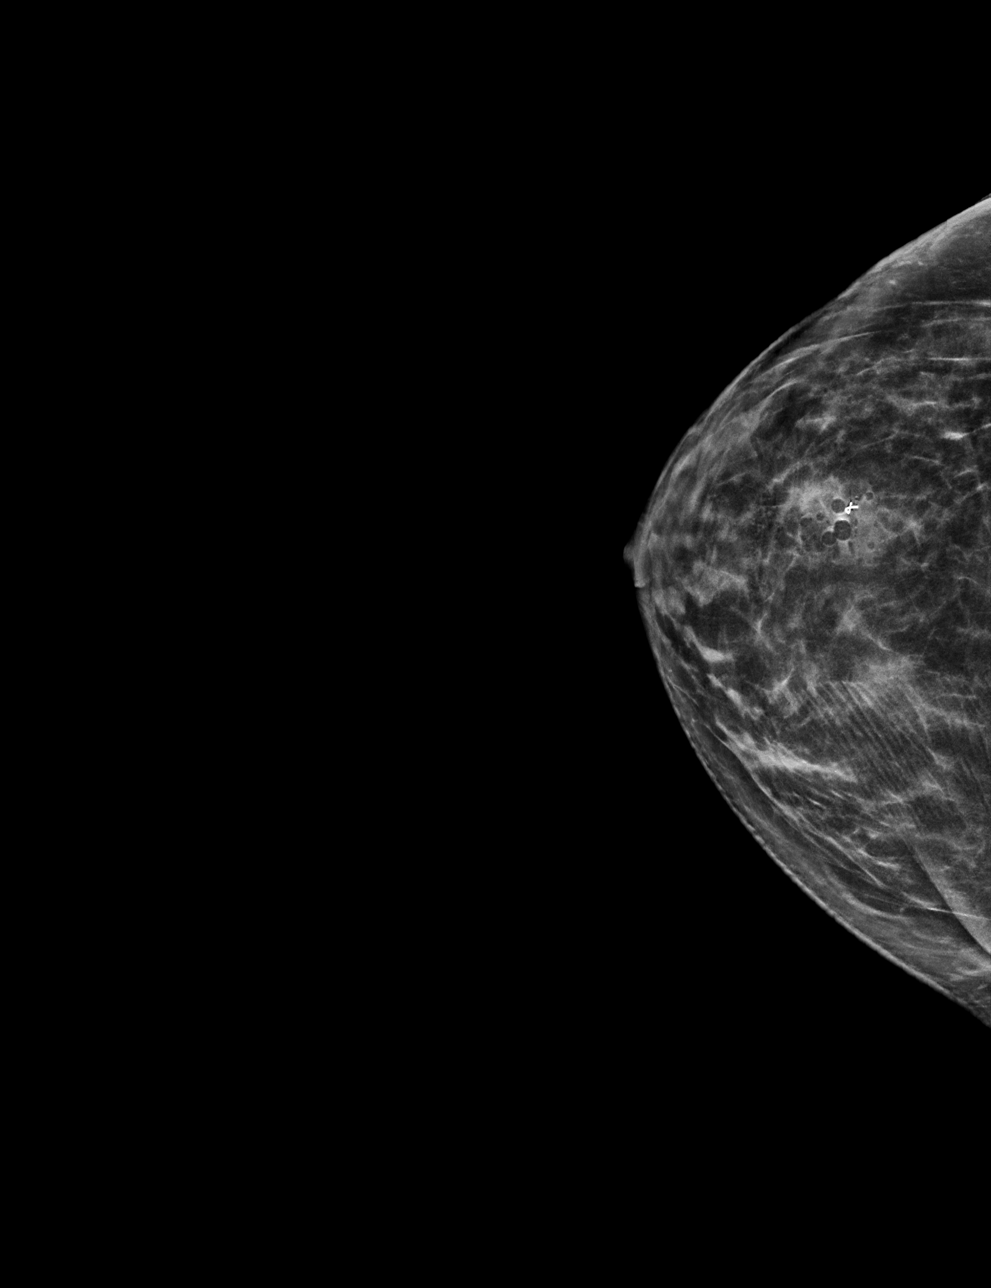

[R ML tomo · tomo slice 21/40.0]
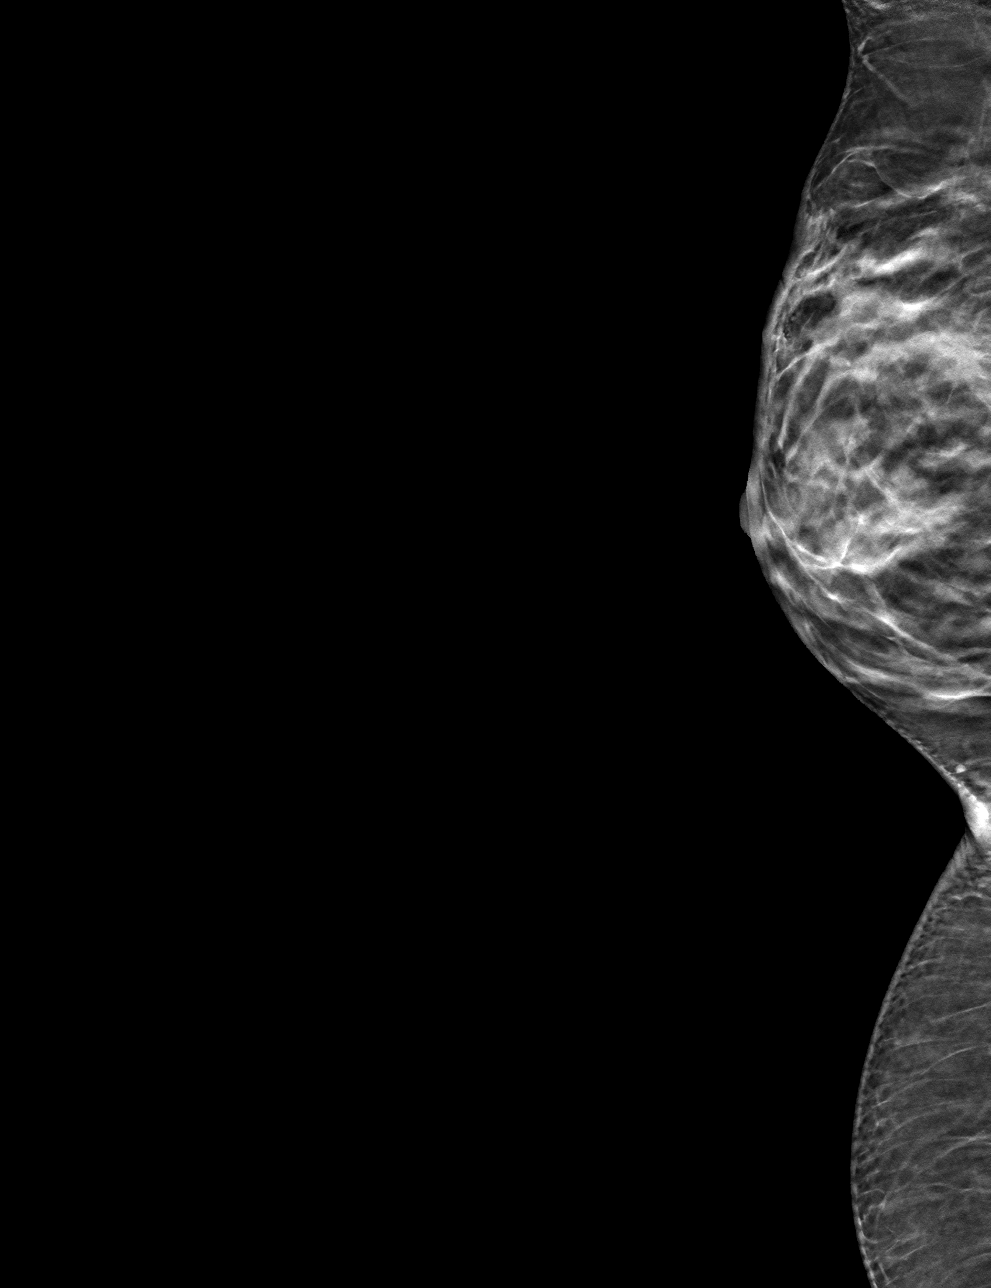

[R CC tomo · tomo slice 26/51.0]
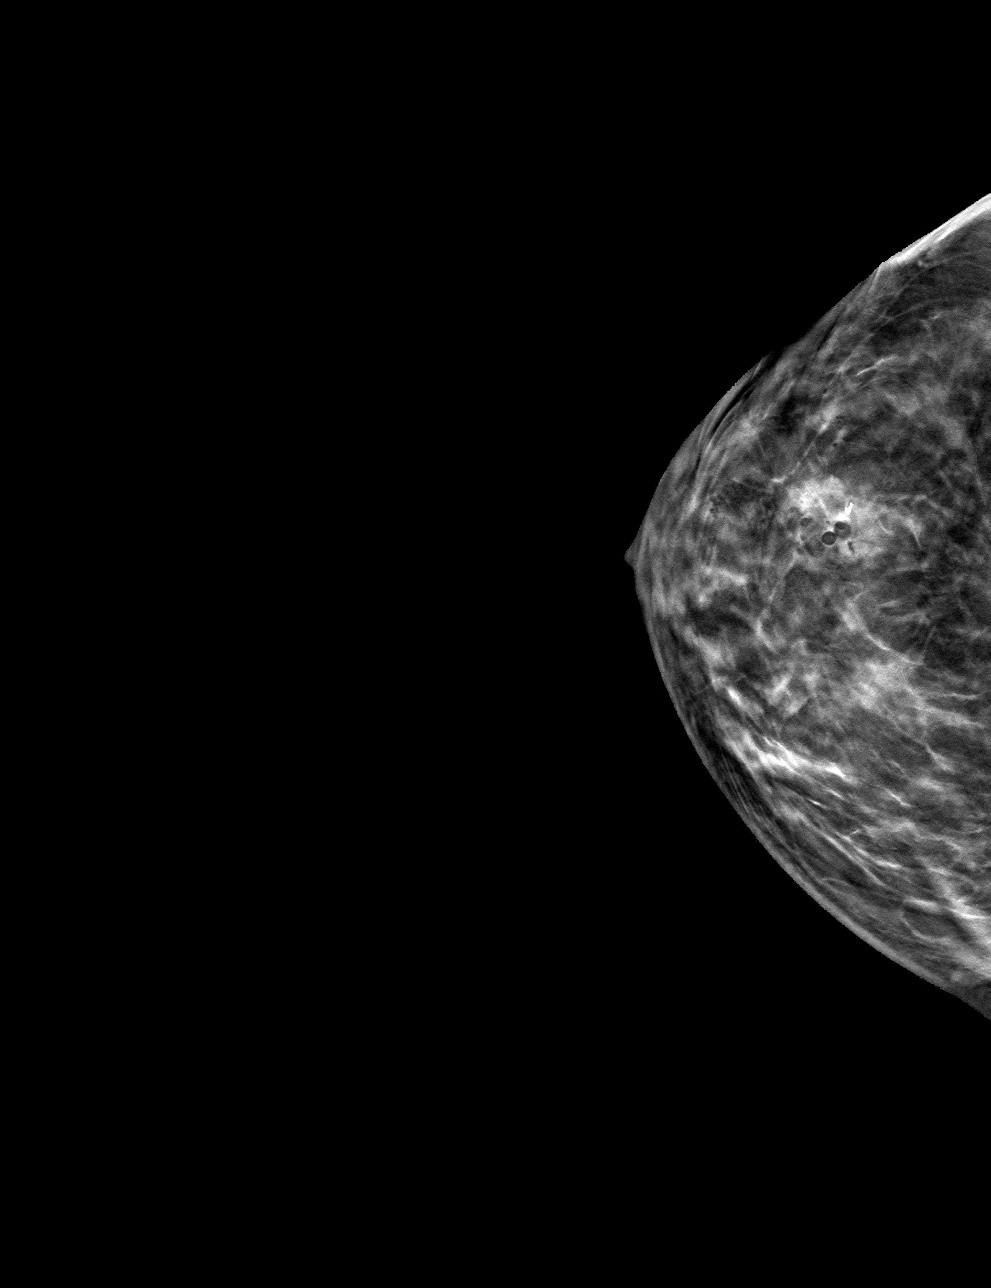

[4 of 12 positions shown; findings below may reference images not displayed]

FINDINGS: Mammographic images were obtained following stereotactic guided
biopsy of right breast calcifications. Biopsy clip migrated 2.1 cm
inferior to the biopsied calcifications.
IMPRESSION: The ribbon shaped biopsy clip migrated 1 cm inferior to the biopsied
calcifications.

Final Assessment: Post Procedure Mammograms for Marker Placement

## 2020-07-04 IMAGING — MG MM BREAST BX W/ LOC DEV 1ST LESION IMAGE BX SPEC STEREO GUIDE*R*
6 series · 6 of 6 positions shown · non-contrast
Comparison: Previous exams.
COMPARISON: Previous exams.

Addendum:
CLINICAL DATA: Biopsy of right breast calcifications

EXAM:
RIGHT BREAST STEREOTACTIC CORE NEEDLE BIOPSY

[R (1 of 6)]
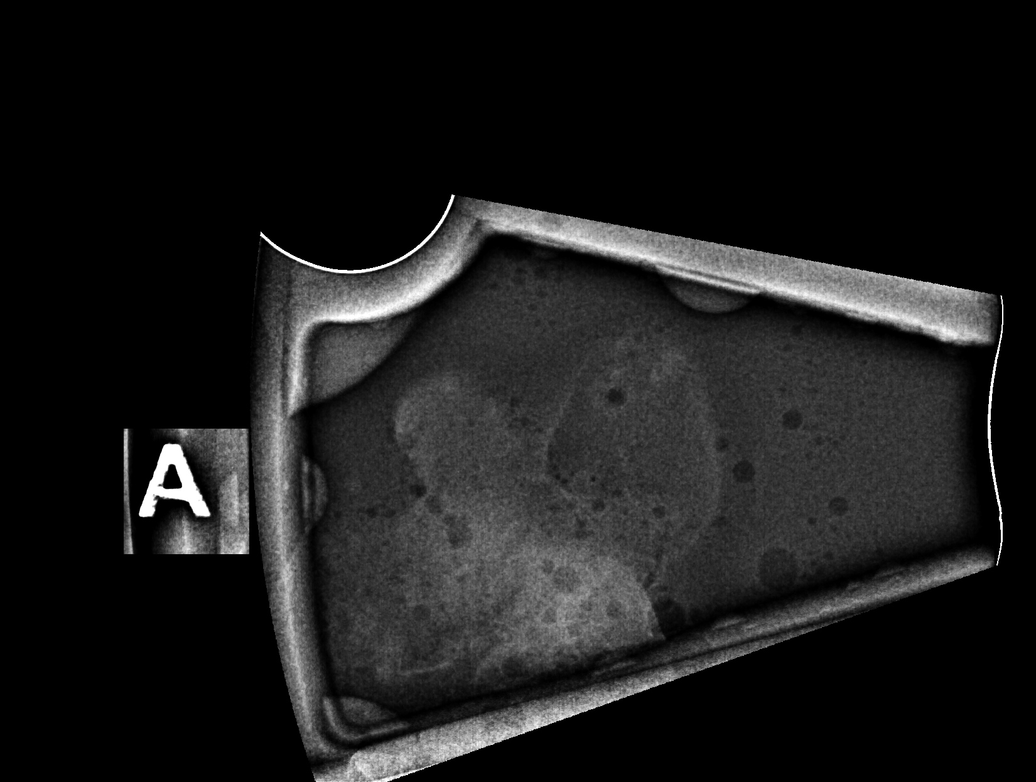

[R (2 of 6)]
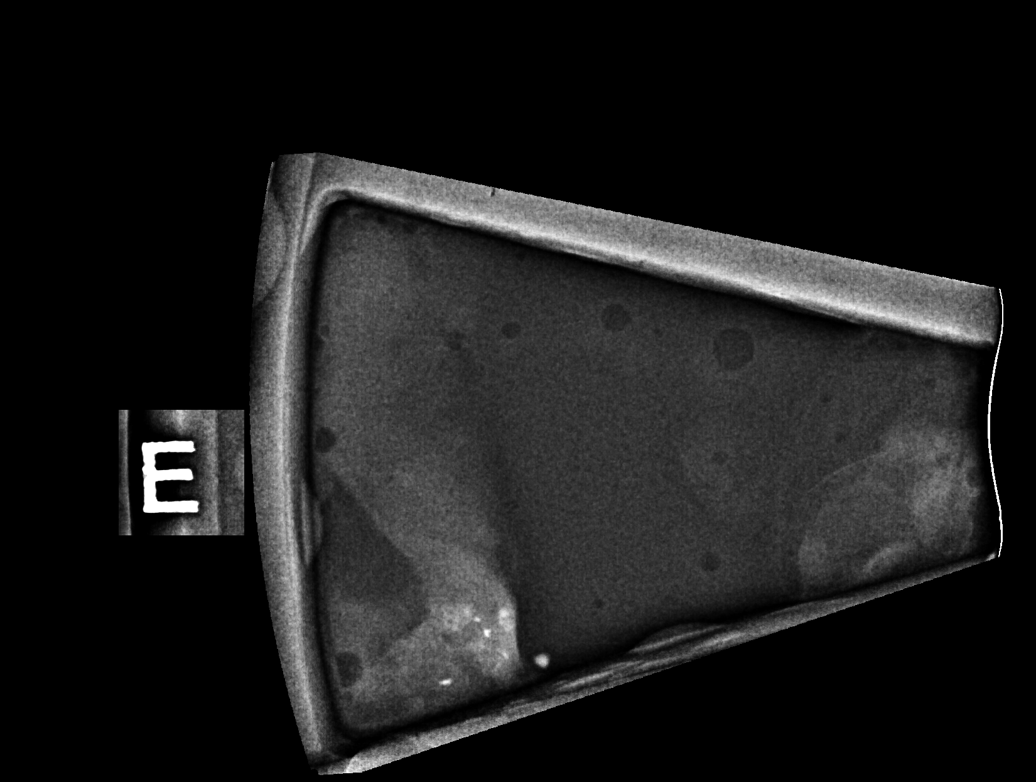

[R (3 of 6)]
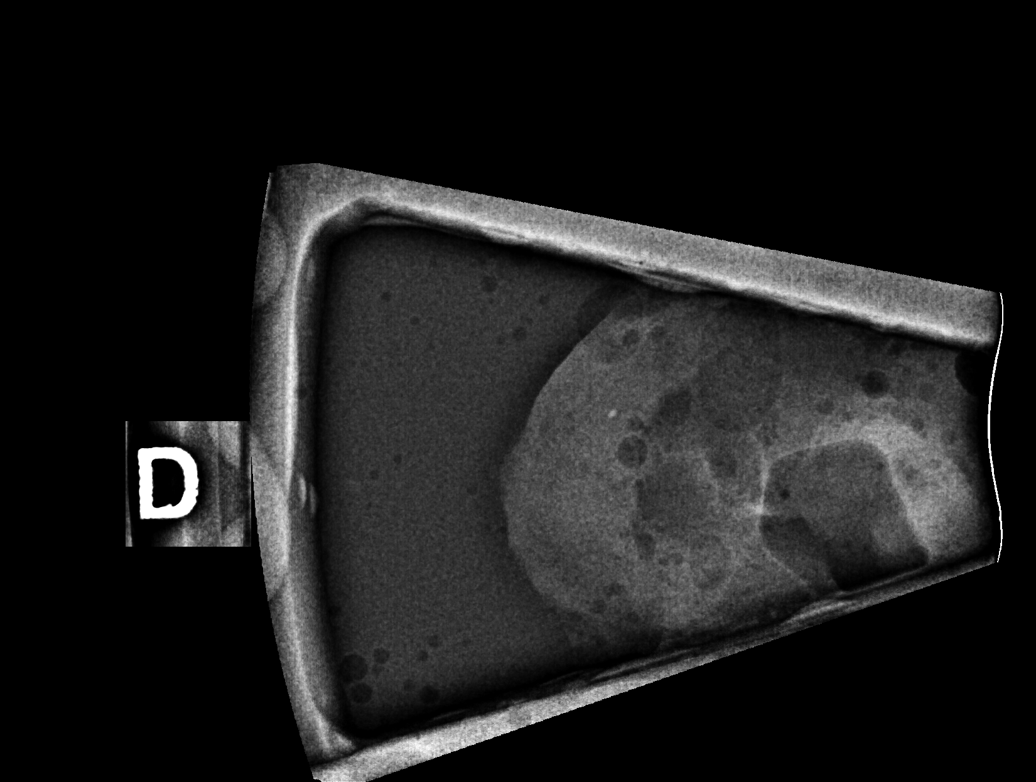

[R (4 of 6)]
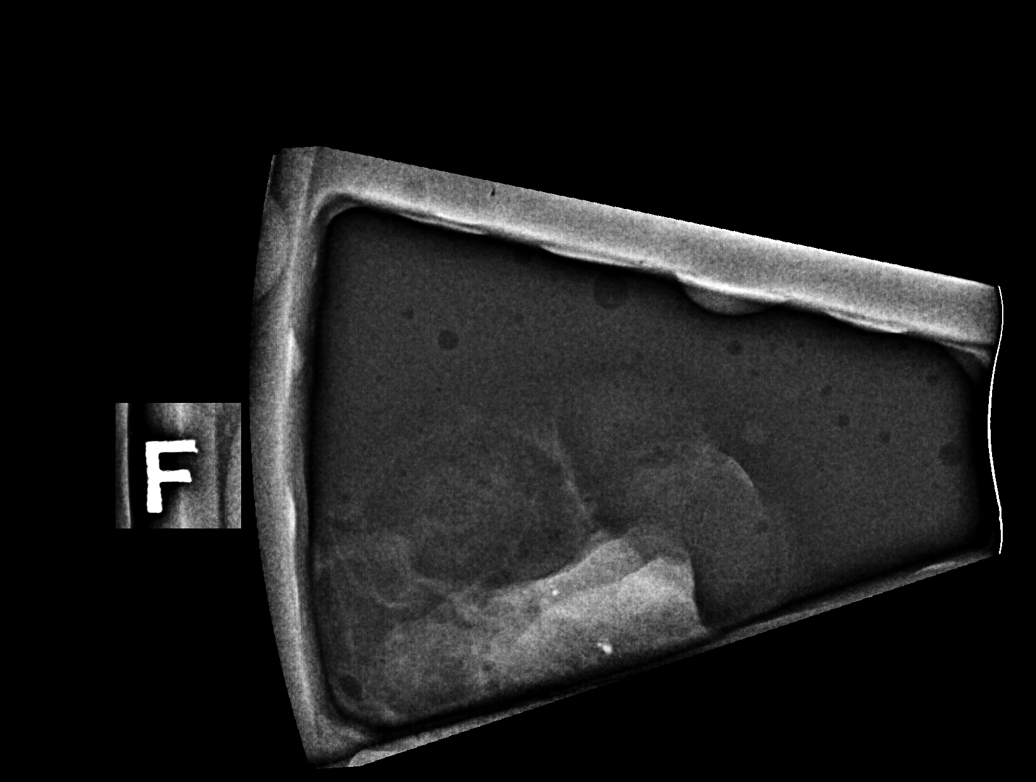

[R (5 of 6)]
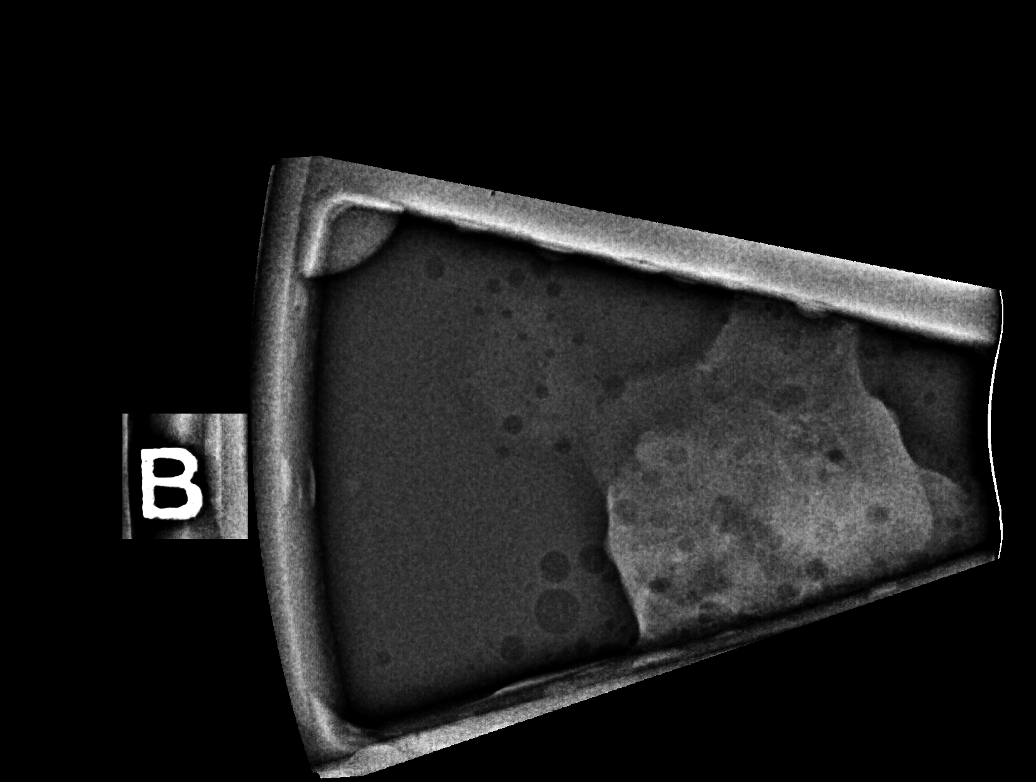

[R (6 of 6)]
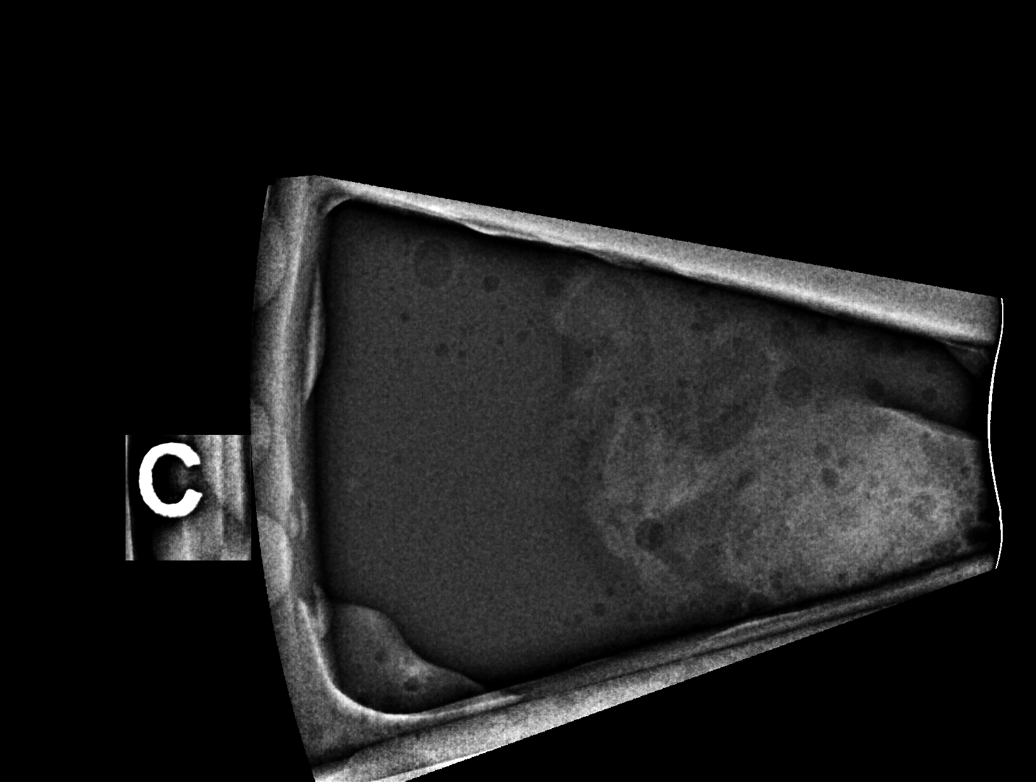

[6 of 6 positions shown; findings below may reference images not displayed]



Using sterile technique and 1% Lidocaine as local anesthetic, under
stereotactic guidance, a 9 gauge vacuum assisted device was used to
perform core needle biopsy of calcifications in the upper outer
right breast using a superior approach. Specimen radiograph was
performed showing calcifications in 3 core specimens. Specimens with
calcifications are identified for pathology.

Lesion quadrant: Upper-outer right

At the conclusion of the procedure, tissue marker clip was deployed
into the biopsy cavity. Follow-up 2-view mammogram was performed and
dictated separately.
IMPRESSION: Stereotactic-guided biopsy of calcifications in the upper outer
right breast. No apparent complications.

ADDENDUM:
Pathology revealed BENIGN MAMMARY PARENCHYMA WITH DENSE STROMAL
FIBROSIS AND FIBROCYSTIC CHANGES, WITH ASSOCIATED
MICROCALCIFICATIONS. NEGATIVE FOR ATYPICAL PROLIFERATIVE BREAST
DISEASE of the RIGHT breast, upper outer quadrant. This was found to
be concordant by Dr. BRUHL.

Pathology results were discussed with the patient by telephone. The
patient reported doing well after the biopsy with tenderness at the
site. Post biopsy instructions and care were reviewed and questions
were answered. The patient was encouraged to call The [HOSPITAL]
[facility] for any
additional concerns.

Breast MRI recommended and annual mammography due to high risk
status. Patient informed a reminder notice would be sent regarding
this appointment.

Pathology results reported by BRUHL RN on [DATE].



Using sterile technique and 1% Lidocaine as local anesthetic, under
stereotactic guidance, a 9 gauge vacuum assisted device was used to
perform core needle biopsy of calcifications in the upper outer
right breast using a superior approach. Specimen radiograph was
performed showing calcifications in 3 core specimens. Specimens with
calcifications are identified for pathology.

Lesion quadrant: Upper-outer right

At the conclusion of the procedure, tissue marker clip was deployed
into the biopsy cavity. Follow-up 2-view mammogram was performed and
dictated separately.
IMPRESSION: Stereotactic-guided biopsy of calcifications in the upper outer
right breast. No apparent complications.

## 2020-07-05 LAB — SURGICAL PATHOLOGY

## 2020-07-08 ENCOUNTER — Ambulatory Visit: Payer: Self-pay | Admitting: Adult Health

## 2020-07-09 ENCOUNTER — Encounter: Payer: Self-pay | Admitting: Obstetrics and Gynecology

## 2020-07-09 ENCOUNTER — Ambulatory Visit (INDEPENDENT_AMBULATORY_CARE_PROVIDER_SITE_OTHER): Payer: No Typology Code available for payment source | Admitting: Obstetrics and Gynecology

## 2020-07-09 ENCOUNTER — Other Ambulatory Visit: Payer: Self-pay

## 2020-07-09 VITALS — BP 105/71 | HR 102 | Ht 64.0 in | Wt 133.1 lb

## 2020-07-09 DIAGNOSIS — Z975 Presence of (intrauterine) contraceptive device: Secondary | ICD-10-CM | POA: Diagnosis not present

## 2020-07-09 DIAGNOSIS — N898 Other specified noninflammatory disorders of vagina: Secondary | ICD-10-CM | POA: Diagnosis not present

## 2020-07-09 DIAGNOSIS — N921 Excessive and frequent menstruation with irregular cycle: Secondary | ICD-10-CM

## 2020-07-09 DIAGNOSIS — Z803 Family history of malignant neoplasm of breast: Secondary | ICD-10-CM

## 2020-07-09 LAB — POCT URINE PREGNANCY: Preg Test, Ur: NEGATIVE

## 2020-07-09 NOTE — Progress Notes (Signed)
GYNECOLOGY PROGRESS NOTE  Subjective:    Patient ID: Isabel [name], female    DOB: [birth year], 29 y.o.   MRN: [id number]  HPI  Patient is a 29 y.o. [id number] female who presents for complaints of irregular cycles with her IUD. Notes irregular bleeding with passage of dark brown clots.  Also has complaints of yellow-white vaginal discharge, intermittent. Denies vaginal itching or burning.  Thinks she would like IUD removed.   Additionally patient notes soreness and bleeding of right breast biopsy site after recent mammogram abnormality.    The following portions of the patient's history were reviewed and updated as appropriate: allergies, current medications, past family history, past medical history, past social history, past surgical history and problem list.  Review of Systems Pertinent items noted in HPI and remainder of comprehensive ROS otherwise negative.   Objective:   Blood pressure 105/71, pulse (!) 102, height 5\' 4"  (1.626 m), weight 133 lb 1.6 oz (60.4 kg). General appearance: alert and no distress  Breast: Right breast biopsy site covered with bandage, clean/dry/intact. Underneath bandage small area of old blood.  Abdomen: soft, non-tender; bowel sounds normal; no masses,  no organomegaly Pelvic: external genitalia normal, rectovaginal septum normal.  Vagina with small discharge. Cervix normal appearing, no lesions and no motion tenderness. IUD threads visible, ~ 3 cm.  Scant thin discharge. Uterus mobile, nontender, normal shape and size.  Adnexae non-palpable, nontender bilaterally.  Extremities: non-tender, no edema.   Labs:  Microscopic wet-mount exam shows no clue cells, no hyphae, no trichomonads, no white blood cells. KOH done.    Imaging:  CLINICAL DATA:  Elevated family history of breast cancer. Mother with history of breast cancer at the age of 39.  EXAM: DIGITAL DIAGNOSTIC BILATERAL MAMMOGRAM WITH CAD AND TOMO  ULTRASOUND RIGHT  BREAST  COMPARISON:  Previous exam(s).  ACR Breast Density Category c: The breast tissue is heterogeneously dense, which may obscure small masses.  FINDINGS: In the RIGHT upper outer breast middle depth, there are calcifications. Spot views demonstrate a 5 mm group of amorphous calcifications at 11 o'clock.  Spot compression was obtained over the palpable area of concern in the RIGHT breast. No suspicious mammographic finding is identified in this area.  No suspicious mass, microcalcification, or other finding is identified in contralateral breast.  Mammographic images were processed with CAD.  On physical exam, no suspicious mass is palpated. There is a prominent ridge along the costosternal junction.  Targeted ultrasound was performed of the area of palpable concern in the RIGHT breast. At the site of concern in the RIGHT upper breast along the costosternal junction, there is no suspicious cystic or solid mass. There is a prominent area of costal cartilage at the site of palpable concern.  IMPRESSION: 1. There is a 5 mm group of indeterminate amorphous calcifications in the RIGHT breast at 11 o'clock middle depth. Recommend stereotactic guided biopsy.  2. No suspicious mammographic or sonographic abnormality at the site of palpable concern in the RIGHT breast. There is a prominent area of costosternal cartilage at the site of palpable concern.  3.  No mammographic evidence of malignancy in the LEFT breast.  RECOMMENDATION: 1. Stereotactic guided biopsy is recommended for the indeterminate group of calcifications in RIGHT breast 11 o'clock middle depth. Ordering provider will be notified and patient will be scheduled by the breast schedulers at her earliest convenience.  2. The American Cancer Society recommends annual MRI and mammography in patients with an estimated lifetime risk of  developing breast cancer greater than 20 - 25%, or who are known or  suspected to be positive for the breast cancer gene.  I have discussed the findings and recommendations with the patient. If applicable, a reminder letter will be sent to the patient regarding the next appointment.  BI-RADS CATEGORY  4: Suspicious.   Electronically Signed   By: Valentino Saxon MD   On: 06/18/2020 14:29    Pathology (07/04/2020, Right Breast Biopsy):  DIAGNOSIS:  A. BREAST, RIGHT UPPER OUTER QUADRANT; STEREOTACTIC CORE NEEDLE BIOPSY:  - BENIGN MAMMARY PARENCHYMA WITH DENSE STROMAL FIBROSIS AND FIBROCYSTIC  CHANGES, WITH ASSOCIATED MICROCALCIFICATIONS.  - NEGATIVE FOR ATYPICAL PROLIFERATIVE BREAST DISEASE.   Assessment:   Breakthrough bleeding associated with intrauterine device (IUD)  Vaginal discharge Family history of breast cancer   Plan:   - Patient with breakthrough bleeding on IUD. Desires removal. Discussed other contraceptive options with patient.  After discussion, would like a trial of Phexxi.  IUD removed (see procedure note below). Given samples of Phexxi.  - Vaginal discharge - wet prep negative today.  May possibly be due to IUD threads increasing discharge.  - Family history of breast cancer. S/p breast recent biopsy.  Is due for alternating MRI in 6 months. Will order.  - Return to clinic for any scheduled appointments or for any gynecologic concerns as needed.     [name], MD [facility]

## 2020-07-09 NOTE — Progress Notes (Signed)
Pt present due to irregular cycles. Pt stated that her cycles were all over the place and she has noticed increase in vaginal discharge.

## 2020-07-09 NOTE — Patient Instructions (Signed)
Lactic acid; Citric acid; Potassium bitartrate vaginal gel What is this medicine? LACTIC ACID; CITRIC ACID; POTASSIUM BITARTRATE (LAK tuhk AS id; SIH trik AS id; poe TASS ee um bi TAAR treit) is used for birth control when you have sexual intercourse to help prevent pregnancy. This medicine may be used for other purposes; ask your health care provider or pharmacist if you have questions. COMMON BRAND NAME(S): PHEXXI What should I tell my health care provider before I take this medicine? They need to know if you have any of these conditions:  frequent kidney or urinary tract infections  HIV or AIDS  pelvic or vaginal infection  sexually transmitted disease, like herpes, gonorrhea, or chlamydia  an unusual or allergic reaction to lactic acid, citric acid, potassium bitartrate, other medicines, foods, dyes, or preservatives  pregnant or trying to get pregnant  breast-feeding How should I use this medicine? This medicine is for use in the vagina only. Follow the directions on the prescription label. Wash hands before and after use. To prevent pregnancy, it is very important that this product is used properly. This product must be applied before sexual contact begins. Do not use it in the rectum. Make sure you carefully read and follow the instructions that come with the product. If you have vaginal sex more than 1 time within 1 hour, you must use a new applicator. Use exactly as directed. Talk to your pediatrician regarding the use of this medicine in children. Special care may be needed. Overdosage: If you think you have taken too much of this medicine contact a poison control center or emergency room at once. NOTE: This medicine is only for you. Do not share this medicine with others. What if I miss a dose? Use before each time you have sexual intercourse as directed on the label. If you miss a dose, you may become pregnant. What may interact with this medicine? Interactions are not  expected. This birth control may be used with other medicines used in the vagina to treat infections including miconazole, metronidazole, and tioconazole. If you have questions ask your doctor or pharmacist. Do not use any other vaginal products at the same time without talking to your health care professional. This list may not describe all possible interactions. Give your health care provider a list of all the medicines, herbs, non-prescription drugs, or dietary supplements you use. Also tell them if you smoke, drink alcohol, or use illegal drugs. Some items may interact with your medicine. What should I watch for while using this medicine? This product does not protect you against HIV infection (AIDS) or other sexually transmitted diseases. This product may be used at any time of your menstrual cycle. This product may be used as soon as your healthcare provider tells you it is safe for you to have sex after childbirth, abortion, or miscarriage. This product may be used with most hormonal birth control methods; a vaginal diaphragm; or latex, polyurethane and polyisoprene condoms. Avoid using this product with contraceptive vaginal rings. What side effects may I notice from receiving this medicine? Side effects that you should report to your doctor or health care professional as soon as possible:  allergic reactions like skin rash, itching or hives, swelling  signs and symptoms of infection like fever; chills; pelvic pain; cloudy urine; pain or trouble passing urine  vaginal discharge, itching or odor  vaginal irritation or burning Side effects that usually do not require medical attention (report these to your doctor or health care professional  if they continue or are bothersome):  mild vaginal irritation This list may not describe all possible side effects. Call your doctor for medical advice about side effects. You may report side effects to FDA at 1-800-FDA-1088. Where should I keep my  medicine? Keep out of the reach of children. Store at room temperature between 15 and 30 degrees C (59 and 86 degrees F). Keep in the foil pouch until ready for use. Follow the directions on the product label. Throw away any unused medicine after the expiration date. NOTE: This sheet is a summary. It may not cover all possible information. If you have questions about this medicine, talk to your doctor, pharmacist, or health care provider.  2020 Elsevier/Gold Standard (2019-04-06 09:34:36)

## 2020-07-18 NOTE — Telephone Encounter (Signed)
VM TO PT TO SCHD BR MRI °

## 2020-07-21 ENCOUNTER — Other Ambulatory Visit: Payer: Self-pay | Admitting: Adult Health

## 2020-07-21 DIAGNOSIS — H6504 Acute serous otitis media, recurrent, right ear: Secondary | ICD-10-CM

## 2020-07-21 DIAGNOSIS — F419 Anxiety disorder, unspecified: Secondary | ICD-10-CM

## 2020-07-21 NOTE — Telephone Encounter (Signed)
Requested Prescriptions  Pending Prescriptions Disp Refills  . buPROPion (WELLBUTRIN SR) 150 MG 12 hr tablet [Pharmacy Med Name: BUPROPION HCL SR 150 MG TABLET] 90 tablet 1    Sig: TAKE 1 TABLET BY MOUTH EVERY DAY     Psychiatry: Antidepressants - bupropion Passed - 07/21/2020 12:33 PM      Passed - Completed PHQ-2 or PHQ-9 in the last 360 days.      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

## 2020-07-23 ENCOUNTER — Ambulatory Visit (INDEPENDENT_AMBULATORY_CARE_PROVIDER_SITE_OTHER): Payer: PRIVATE HEALTH INSURANCE | Admitting: Neurology

## 2020-07-23 ENCOUNTER — Other Ambulatory Visit: Payer: Self-pay

## 2020-07-23 ENCOUNTER — Encounter: Payer: Self-pay | Admitting: Neurology

## 2020-07-23 VITALS — BP 105/73 | HR 95 | Ht 64.0 in | Wt 135.4 lb

## 2020-07-23 DIAGNOSIS — R519 Headache, unspecified: Secondary | ICD-10-CM

## 2020-07-23 DIAGNOSIS — R200 Anesthesia of skin: Secondary | ICD-10-CM

## 2020-07-23 DIAGNOSIS — G43709 Chronic migraine without aura, not intractable, without status migrainosus: Secondary | ICD-10-CM

## 2020-07-23 MED ORDER — TOPIRAMATE 25 MG PO TABS
25.0000 mg | ORAL_TABLET | Freq: Every day | ORAL | 5 refills | Status: DC
Start: 2020-07-23 — End: 2021-01-20

## 2020-07-23 NOTE — Progress Notes (Signed)
NEUROLOGY CONSULTATION NOTE  Isabel [name] MRN: [id number] DOB: 27-Dec-1990  Referring provider: Blanchie Dessert, MD (ED) Primary care provider: Laverna Peace, FNP  Reason for consult: facial numbness, headache  HISTORY OF PRESENT ILLNESS: Isabel Abril. [name] is a 29 year old right-handed female who presents for headache.  History supplemented by ED notes.  MRI of cervical spine and MRA of neck personally reviewed.  She has remote history of migraines several years ago but then resolved.  She started getting new headaches several months ago.  She describes a diffuse severe throbbing headache with pins and needles sensation around her right eye with severe right eye pressure, that sometimes radiates to back of her neck.  It occurs daily but fluctuates.  Sometimes she sees spots in both eyes.  She sometimes has nausea but no nausea.  Photophobia but no photophobia.   Sometimes she gets a paroxysmal stabbing pain in the mild-occipital region, occurring every now and then.  She started feeling numbness and tingling in the arms.  MRI of cervical spine from 03/17/2020 was unremarkable.  However, it did show evidence of empty sella.  MRA of neck was negative. She subsequently saw a chiropractor and neck pain and arm numbness has improved.  She was treated for recurrent ear infections.  She subsequently saw ENT who told her she did not have ear infection but that she should see neurology due to the empty sella finding on cervical MRI.  She denies visual obscurations and pulsatile tinnitus.  She does have high-frequency tinnitus.  Current medications: Gabapentin 252m BID (for right sciatica) Wellbutrin 1544m  PAST MEDICAL HISTORY: Past Medical History:  Diagnosis Date  . Aberrant right subclavian artery   . Anxiety   . Dysmenorrhea   . Family history of breast cancer in mother   . Genetic testing 04/25/18   PALB2 analysis @ [facility] - Familial pathogenic PALB2 mutation detected  .  Migraines   . 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 BREAST BIOPSY Right 07/04/2020   stereo biopsy/ ribbon clip/ path pending  . CHOLECYSTECTOMY      MEDICATIONS: Current Outpatient Medications on File Prior to Visit  Medication Sig Dispense Refill  . buPROPion (WELLBUTRIN SR) 150 MG 12 hr tablet TAKE 1 TABLET BY MOUTH EVERY DAY 90 tablet 1  . gabapentin (NEURONTIN) 100 MG capsule TAKE 2 CAPSULES (200 MG TOTAL) BY MOUTH 2 (TWO) TIMES DAILY. 360 capsule 1  . ibuprofen (ADVIL) 600 MG tablet Take 1 tablet (600 mg total) by mouth every 6 (six) hours as needed. (Patient not taking: Reported on 06/06/2020) 30 tablet 0  . levonorgestrel (MIRENA) 20 MCG/24HR IUD 1 each by Intrauterine route once.    . [DISCONTINUED] albuterol (VENTOLIN HFA) 108 (90 Base) MCG/ACT inhaler Inhale 2 puffs into the lungs every 4 (four) hours as needed for wheezing or shortness of breath. (Patient not taking: Reported on 02/26/2020) 18 g 0  . [DISCONTINUED] famotidine (PEPCID) 20 MG tablet Take 1 tablet (20 mg total) by mouth 2 (two) times daily. 60 tablet 0   No current facility-administered medications on file prior to visit.    ALLERGIES: Allergies  Allergen Reactions  . Amoxicillin Hives  . Penicillins Hives    FAMILY HISTORY: Family History  Problem Relation Age of Onset  . Breast cancer Mother 3934     currently 4860.  Hypertension Father   . Arthritis Other   . Diabetes Other   . Cancer Other   . Diabetes Maternal Grandmother   . Diabetes Maternal Grandfather   . Diabetes Paternal Grandmother   . Diabetes Paternal Grandfather   . Breast cancer Maternal Aunt 40       currently 39; PALB2 mutation  . Colon cancer Neg Hx   . Heart disease Neg Hx    SOCIAL HISTORY: Social History   Socioeconomic History  . Marital status: Single    Spouse name: Not on  file  . Number of children: Not on file  . Years of education: 40  . Highest education level: Not on file  Occupational History  . Not on file  Tobacco Use  . Smoking status: Current Every Day Smoker    Packs/day: 0.50    Types: Cigarettes  . Smokeless tobacco: Never Used  Vaping Use  . Vaping Use: Former  Substance and Sexual Activity  . Alcohol use: No  . Drug use: Not Currently    Types: Marijuana  . Sexual activity: Yes    Birth control/protection: None, Condom, I.U.D.    Comment: Husband plans vasectomy   Other Topics Concern  . Not on file  Social History Narrative   Right handed   Lives alone one story   Social Determinants of Health   Financial Resource Strain:   . Difficulty of Paying Living Expenses: Not on file  Food Insecurity:   . Worried About Charity fundraiser in the Last Year: Not on file  . Ran Out of Food in the Last Year: Not on file  Transportation Needs:   . Lack of Transportation (Medical): Not on file  . Lack of Transportation (Non-Medical): Not on file  Physical Activity:   . Days of Exercise per Week: Not on file  . Minutes of Exercise per Session: Not on file  Stress:   . Feeling of Stress : Not on file  Social Connections:   . Frequency of Communication with Friends and Family: Not on file  . Frequency of Social Gatherings with Friends and Family: Not on file  . Attends Religious Services: Not on file  . Active Member of Clubs or Organizations: Not on file  . Attends Archivist Meetings: Not on file  . Marital Status: Not on file  Intimate Partner Violence:   . Fear of Current or Ex-Partner: Not on file  . Emotionally Abused: Not on file  . Physically Abused: Not on file  . Sexually Abused: Not on file    PHYSICAL EXAM: Blood pressure 105/73, pulse 95, height '5\' 4"'  (1.626 m), weight 135 lb 6.4 oz (61.4 kg), SpO2 98 %. General: No acute distress.  Patient appears well-groomed.   Head:  Normocephalic/atraumatic Eyes:   fundi examined but not visualized Neck: supple, no paraspinal tenderness, full range of motion Back: No paraspinal tenderness Heart: regular rate and rhythm Lungs: Clear to auscultation bilaterally. Vascular: No carotid bruits. Neurological Exam: Mental status: alert and oriented to person, place, and time, recent and remote memory intact, fund of knowledge intact, attention and concentration intact, speech fluent and not dysarthric, language intact. Cranial nerves: CN I: not tested CN II: pupils equal, round and reactive to light, visual fields intact CN III, IV, VI:  full range of motion, no nystagmus, no ptosis CN V: facial sensation intact CN VII: upper and lower face symmetric CN VIII: hearing intact CN IX, X: gag intact, uvula midline CN  XI: sternocleidomastoid and trapezius muscles intact CN XII: tongue midline Bulk & Tone: normal, no fasciculations. Motor:  5/5 throughout Sensation:  Pinprick and vibration sensation intact.  Deep Tendon Reflexes:  2+ throughout, toes downgoing.   Finger to nose testing:  Without dysmetria.   Heel to shin:  Without dysmetria.   Gait:  Normal station and stride.  Able to turn and tandem walk. Romberg negative.  IMPRESSION: 1.  Chronic migraine without aura, without status migrainosus, not intractable.  She also describes possible primary stabbing headache. 2.  Empty sella.  Often normal finding but may indicate increased intracranial pressure  PLAN: 1.  Start topiramate 60m at bedtime.  We can increase to 552mat bedtime in 4 weeks if needed. 2.  Will order MRI of brain with and without contrast 3.  Will refer to ophthalmology for formal eye exam. 4.  Follow up.  Thank you for allowing me to take part in the care of this patient.  AdMetta ClinesDO  CC:  MiLaverna PeaceFNP

## 2020-07-23 NOTE — Patient Instructions (Signed)
  1. Start topiramate 25mg  at bedtime.  If headaches not improved in 4 weeks, contact me and I will increase dose. 2. Will order MRI of brain with and without contrast 3. Will refer you to the eye doctor, Dr. Marshall Cork at [facility] 4. Limit use of pain relievers to no more than 2 days out of the week.  These medications include acetaminophen, NSAIDs (ibuprofen/Advil/Motrin, naproxen/Aleve, triptans (Imitrex/sumatriptan), Excedrin, and narcotics.  This will help reduce risk of rebound headaches. 5. Be aware of common food triggers:  - Caffeine:  coffee, black tea, cola, Mt. Dew  - Chocolate  - Dairy:  aged cheeses (brie, blue, cheddar, gouda, [place], provolone, [place], Swiss, etc), chocolate milk, buttermilk, sour cream, limit eggs and yogurt  - Nuts, peanut butter  - Alcohol  - Cereals/grains:  FRESH breads (fresh bagels, sourdough, doughnuts), yeast productions  - Processed/canned/aged/cured meats (pre-packaged deli meats, hotdogs)  - MSG/glutamate:  soy sauce, flavor enhancer, pickled/preserved/marinated foods  - Sweeteners:  aspartame (Equal, Nutrasweet).  Sugar and Splenda are okay  - Vegetables:  legumes (lima beans, lentils, snow peas, fava beans, pinto peans, peas, garbanzo beans), sauerkraut, onions, olives, pickles  - Fruit:  avocados, bananas, citrus fruit (orange, lemon, grapefruit), mango  - Other:  Frozen meals, macaroni and cheese 6. Routine exercise 7. Stay adequately hydrated (aim for 64 oz water daily) 8. Keep headache diary 9. Maintain proper stress management 10. Maintain proper sleep hygiene 11. Do not skip meals 12. Consider supplements:  magnesium citrate 400mg  daily, riboflavin 400mg  daily, coenzyme Q10 100mg  three times daily.

## 2020-08-01 ENCOUNTER — Telehealth: Payer: Self-pay | Admitting: Neurology

## 2020-08-01 NOTE — Telephone Encounter (Signed)
[facility] per billing Mri do not need a PA.   Please call us back to let us know where you can have the test done and we can send the referral and get it authorized.

## 2020-08-01 NOTE — Telephone Encounter (Signed)
Patient called and said her MRI order needs sent somewhere else, it will not be covered by her insurance at [facility].

## 2020-08-02 MED ORDER — DIAZEPAM 5 MG PO TABS: 5.0000 mg | ORAL_TABLET | Freq: Once | ORAL | 0 refills | Status: AC

## 2020-08-02 NOTE — Addendum Note (Signed)
Addended by: Augusto Gamble on: 08/02/2020 04:32 PM   Modules accepted: Orders

## 2020-08-05 ENCOUNTER — Other Ambulatory Visit: Payer: Self-pay

## 2020-08-05 ENCOUNTER — Ambulatory Visit
Admission: RE | Admit: 2020-08-05 | Discharge: 2020-08-05 | Disposition: A | Discharge status: Home / Self Care | Payer: PRIVATE HEALTH INSURANCE | Source: Ambulatory Visit | Attending: Obstetrics and Gynecology | Admitting: Obstetrics and Gynecology

## 2020-08-05 DIAGNOSIS — Z803 Family history of malignant neoplasm of breast: Secondary | ICD-10-CM | POA: Diagnosis present

## 2020-08-05 DIAGNOSIS — Z1231 Encounter for screening mammogram for malignant neoplasm of breast: Secondary | ICD-10-CM | POA: Insufficient documentation

## 2020-08-05 IMAGING — MR MR BREAST BILAT WO/W CM
3 of 10 series · 12 of 48 positions shown · IV contrast (6ml Gadavist)
Comparison: Previous exam(s).

CLINICAL DATA: New hard lump on right side opf chest/breast x 2
months with pain. Marker was used to mark superior aspect of lump.
BRACCA positive. Maternal family hx breast CA. Previous right breast
bx. Hx hiatal hernia. Benign biopsy of right breast calcifications
performed on [DATE].

LABS:  No labs drawn at time of imaging.
EXAM:
BILATERAL BREAST MRI WITH AND WITHOUT CONTRAST
TECHNIQUE: Multiplanar, multisequence MR images of both breasts were obtained
prior to and following the intravenous administration of 6 ml of
Gadavist

[Series 6: T1 · axial · B · 1.5mm · 0.91mm/px · z∈[-103,+64]mm · 6 of 112 slices shown]
[im 1/112]
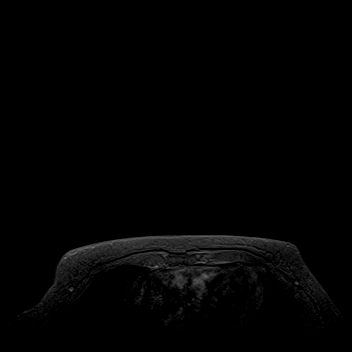
[im 23/112]
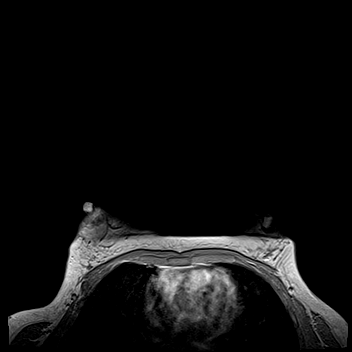
[im 45/112]
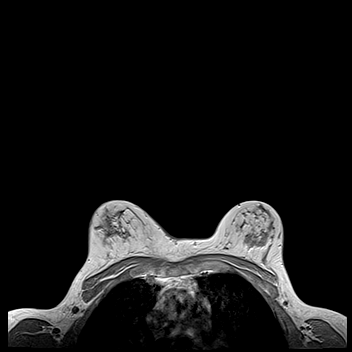
[im 67/112]
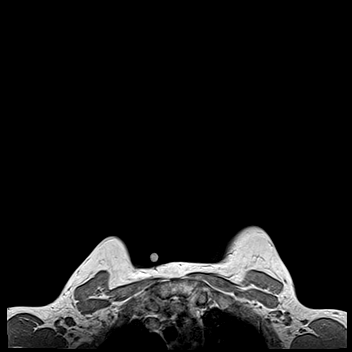
[im 89/112]
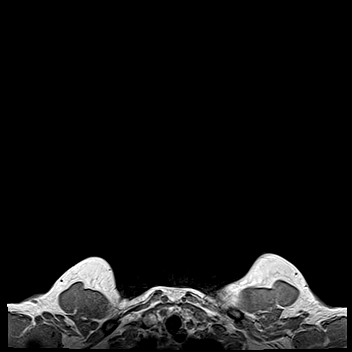
[im 112/112]
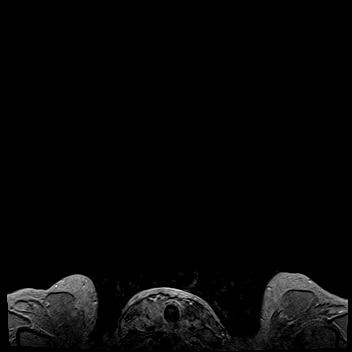

[Series 7: T2 · axial · B · 3.0mm · 0.91mm/px · z∈[-101,+61]mm · 3 of 46 slices shown (1 of 2)]
[im 1/46]
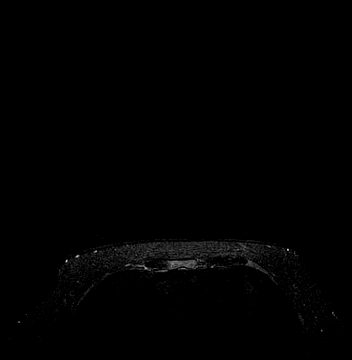
[im 23/46]
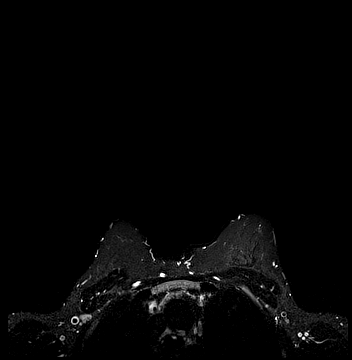
[im 46/46]
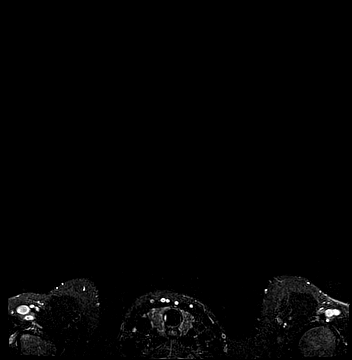

[Series 9: T2 · axial · B · 3.0mm · 0.91mm/px · z∈[-101,+61]mm · 3 of 46 slices shown (2 of 2)]
[im 1/46]
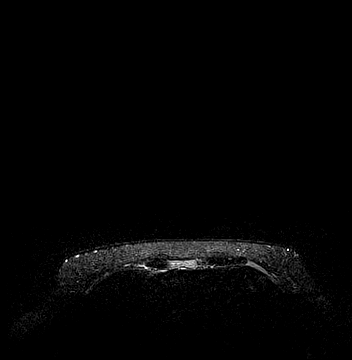
[im 23/46]
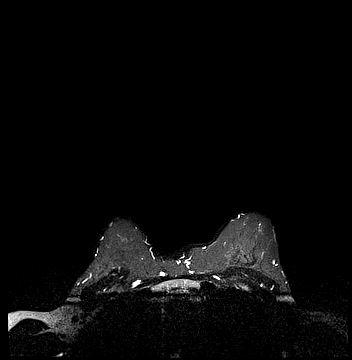
[im 46/46]
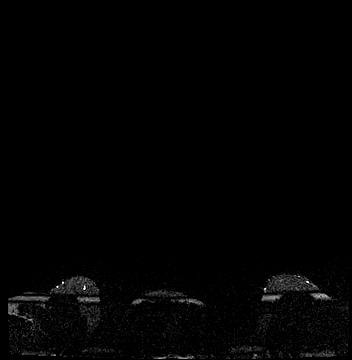

[12 of 48 positions shown; findings below may reference images not displayed]

Three-dimensional MR images were rendered by post-processing of the
original MR data on an independent workstation. The
three-dimensional MR images were interpreted, and findings are
reported in the following complete MRI report for this study. Three
dimensional images were evaluated at the independent interpreting
workstation using the DynaCAD thin client.
FINDINGS: Breast composition: b. Scattered fibroglandular tissue.

Background parenchymal enhancement: Moderate.

Right breast: No mass or abnormal enhancement. Biopsy marker clip
susceptibility artifact is noted in the upper outer quadrant.

Left breast: No mass or abnormal enhancement.

Lymph nodes: No abnormal appearing lymph nodes.

Ancillary findings: No mass or abnormality is seen in the area of
clinical concern. The area of clinical concern lies superior and
medial to the right breast, overlying the region of the right
sternoclavicular joint. The right sternoclavicular joint could be
the cause of the palpable abnormality. There is no significant
degenerative/arthropathic change the sternoclavicular joints.
IMPRESSION: 1. No evidence of breast malignancy.
2. No chest wall mass or abnormal signal or enhancement. No
abnormality is seen to correspond to the reported palpable
abnormality. The palpable abnormality may be due to the underlying
right sternoclavicular joint. The joint itself has a normal MRI
appearance.

RECOMMENDATION:
1. Screening mammogram in [DATE], last bilateral mammography
dated [DATE]. Patient's mother was diagnosed with breast
carcinoma at age 38.(Code:[F3])
2. Annual screening breast MRI given the patient's significantly
elevated lifetime risk for developing breast carcinoma.

BI-RADS CATEGORY  1: Negative.

## 2020-08-05 MED ORDER — GADOBUTROL 1 MMOL/ML IV SOLN
6.0000 mL | Freq: Once | INTRAVENOUS | Status: AC | PRN
  Administered 2020-08-05: 6 mL via INTRAVENOUS

## 2020-08-07 ENCOUNTER — Ambulatory Visit: Payer: Medicaid Other | Admitting: Neurology

## 2020-08-21 ENCOUNTER — Ambulatory Visit: Payer: Medicaid Other | Admitting: Neurology

## 2020-08-22 ENCOUNTER — Other Ambulatory Visit: Payer: Self-pay | Admitting: Neurology

## 2020-08-22 ENCOUNTER — Telehealth: Payer: Self-pay

## 2020-08-22 DIAGNOSIS — R200 Anesthesia of skin: Secondary | ICD-10-CM

## 2020-08-22 DIAGNOSIS — R519 Headache, unspecified: Secondary | ICD-10-CM

## 2020-08-22 DIAGNOSIS — G43709 Chronic migraine without aura, not intractable, without status migrainosus: Secondary | ICD-10-CM

## 2020-08-22 MED ORDER — NORTRIPTYLINE HCL 10 MG PO CAPS: 10.0000 mg | ORAL_CAPSULE | Freq: Every day | ORAL | 5 refills | Status: DC

## 2020-08-22 MED ORDER — DIAZEPAM 5 MG PO TABS: ORAL_TABLET | ORAL | 0 refills | Status: DC

## 2020-08-22 NOTE — Telephone Encounter (Signed)
Script for nortriptyline 10mg  sent.

## 2020-08-22 NOTE — Telephone Encounter (Signed)
Telephone call from pt, Pt states she would like to start Nortripline 10 mg at bedtime.    Mri location changed. Per pt insurnace will cover pt having it done at [facility]

## 2020-09-04 NOTE — Progress Notes (Signed)
    MRN : [id number]  Isabel [name] is a 29 y.o. (06-01-1991) female who presents with chief complaint of painful varicose veins.    The patient's right lower extremity was sterilely prepped and draped.  The ultrasound machine was used to visualize the right great saphenous vein throughout its course.  A segment at the knee was selected for access.  The saphenous vein was accessed without difficulty using ultrasound guidance with a micropuncture needle.   An 0.018  wire was placed beyond the saphenofemoral junction through the sheath and the microneedle was removed.  The 65 cm sheath was then placed over the wire and the wire and dilator were removed.  The laser fiber was placed through the sheath and its tip was placed approximately 2 cm below the saphenofemoral junction.  Tumescent anesthesia was then created with a dilute lidocaine solution.  Laser energy was then delivered with constant withdrawal of the sheath and laser fiber.  Approximately 1154 Joules of energy were delivered over a length of 29 cm.  Sterile dressings were placed.  The patient tolerated the procedure well without complications.

## 2020-09-05 ENCOUNTER — Encounter: Payer: No Typology Code available for payment source | Admitting: Obstetrics and Gynecology

## 2020-09-05 ENCOUNTER — Ambulatory Visit (INDEPENDENT_AMBULATORY_CARE_PROVIDER_SITE_OTHER): Payer: No Typology Code available for payment source | Admitting: Vascular Surgery

## 2020-09-05 ENCOUNTER — Other Ambulatory Visit: Payer: Self-pay

## 2020-09-05 ENCOUNTER — Encounter (INDEPENDENT_AMBULATORY_CARE_PROVIDER_SITE_OTHER): Payer: Self-pay | Admitting: Vascular Surgery

## 2020-09-05 VITALS — BP 116/79 | HR 101 | Resp 16 | Wt 134.0 lb

## 2020-09-05 DIAGNOSIS — I83813 Varicose veins of bilateral lower extremities with pain: Secondary | ICD-10-CM

## 2020-09-06 ENCOUNTER — Ambulatory Visit
Admission: RE | Admit: 2020-09-06 | Discharge: 2020-09-06 | Disposition: A | Discharge status: Home / Self Care | Payer: PRIVATE HEALTH INSURANCE | Source: Ambulatory Visit | Attending: Neurology | Admitting: Neurology

## 2020-09-06 ENCOUNTER — Telehealth: Payer: Self-pay | Admitting: Neurology

## 2020-09-06 ENCOUNTER — Other Ambulatory Visit (INDEPENDENT_AMBULATORY_CARE_PROVIDER_SITE_OTHER): Payer: Self-pay | Admitting: Vascular Surgery

## 2020-09-06 ENCOUNTER — Telehealth: Payer: Self-pay

## 2020-09-06 DIAGNOSIS — R200 Anesthesia of skin: Secondary | ICD-10-CM | POA: Diagnosis present

## 2020-09-06 DIAGNOSIS — R519 Headache, unspecified: Secondary | ICD-10-CM | POA: Insufficient documentation

## 2020-09-06 DIAGNOSIS — G43709 Chronic migraine without aura, not intractable, without status migrainosus: Secondary | ICD-10-CM | POA: Insufficient documentation

## 2020-09-06 DIAGNOSIS — I83813 Varicose veins of bilateral lower extremities with pain: Secondary | ICD-10-CM

## 2020-09-06 IMAGING — MR MR HEAD WO/W CM
13 series · 48 of 48 positions shown · IV contrast (6ml Gadavist)
Comparison: Head CT [DATE].

CLINICAL DATA: Chronic migraine without SORIN without status
migrainosus, not intractable. Right facial numbness. Worsening
headaches.

EXAM:
MRI HEAD WITHOUT AND WITH CONTRAST
TECHNIQUE: Multiplanar, multiecho pulse sequences of the brain and surrounding
structures were obtained without and with intravenous contrast.
CONTRAST:  6mL GADAVIST GADOBUTROL 1 MMOL/ML IV SOLN

[Series 5: ax dwi_tracew · axial · 3.0mm · 0.60mm/px · z∈[-88,+66]mm · 4 of 96 slices shown]
[im 1/96]
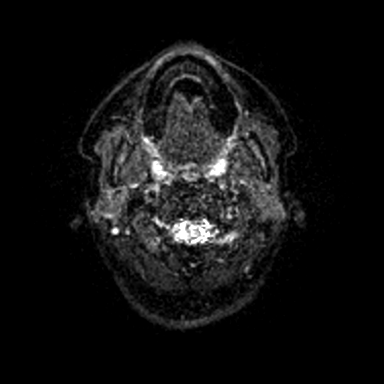
[im 32/96]
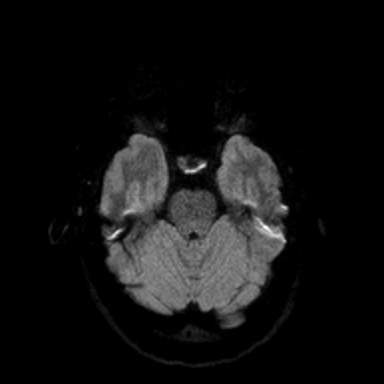
[im 64/96]
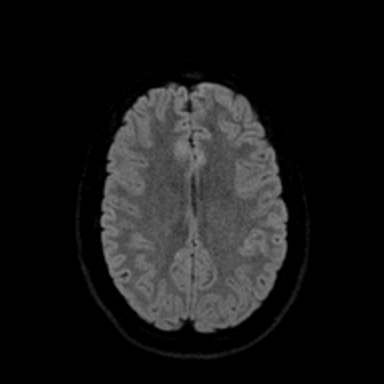
[im 96/96]
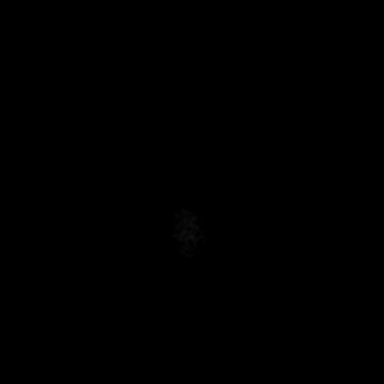

[Series 6: ax dwi_adc · axial · 3.0mm · 0.60mm/px · z∈[-88,+66]mm · 3 of 48 slices shown]
[im 1/48]
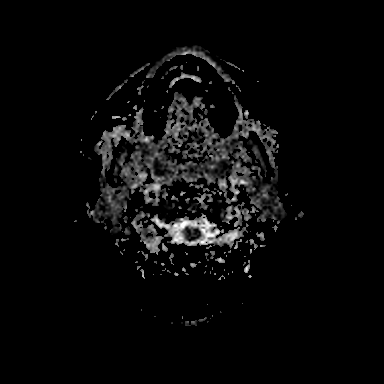
[im 24/48]
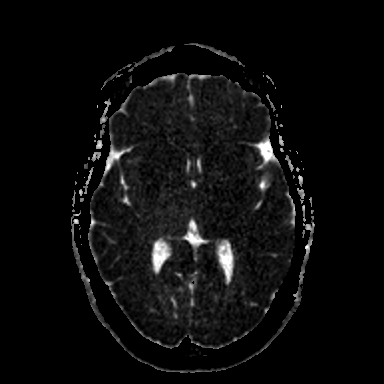
[im 48/48]
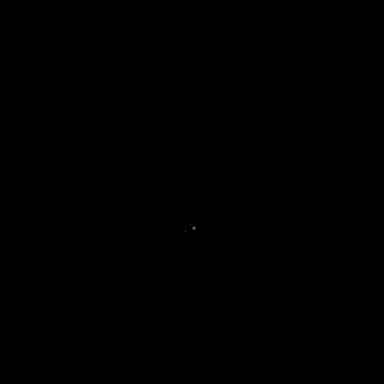

[Series 7: cor dwi_tracew · coronal · 5.0mm · 1.31mm/px · 4 of 76 slices shown]
[im 1/76]
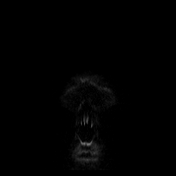
[im 26/76]
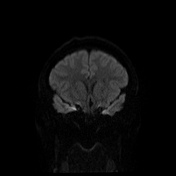
[im 51/76]
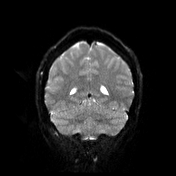
[im 76/76]
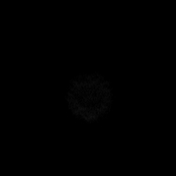

[Series 8: cor dwi_adc · coronal · 5.0mm · 1.31mm/px · 2 of 38 slices shown]
[im 1/38]
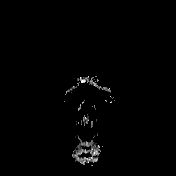
[im 38/38]
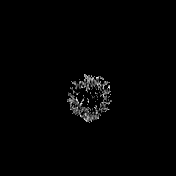

[Series 9: T1 · sagittal · 5.0mm · 0.62mm/px · 1 of 21 slices shown (1 of 2)]
[im 1/21]
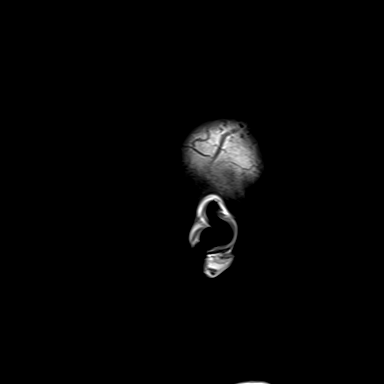

[Series 10: T2 · axial · 5.0mm · 0.53mm/px · 1 of 25 slices shown]
[im 1/25]
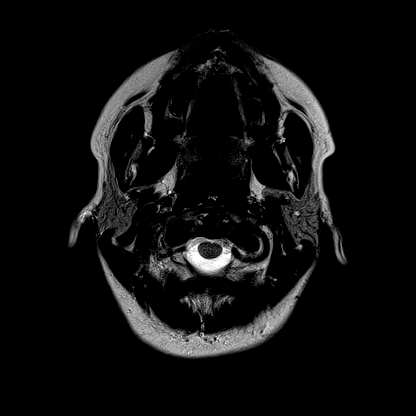

[Series 12: pha_images · axial · 3.0mm · 0.90mm/px · z∈[-84,+64]mm · 3 of 51 slices shown]
[im 1/51]
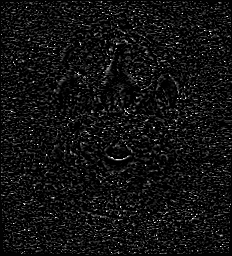
[im 26/51]
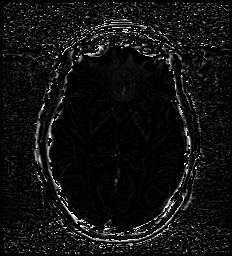
[im 51/51]
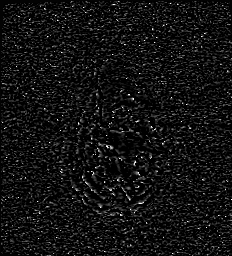

[Series 13: swi_images · axial · 3.0mm · 0.90mm/px · z∈[-84,+67]mm · 3 of 52 slices shown]
[im 1/52]
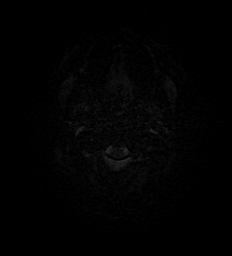
[im 26/52]
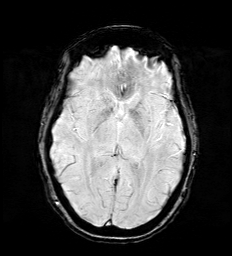
[im 52/52]
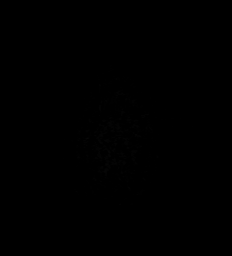

[Series 15: FLAIR · axial · 3.0mm · 0.53mm/px · z∈[-86,+69]mm · 3 of 53 slices shown]
[im 1/53]
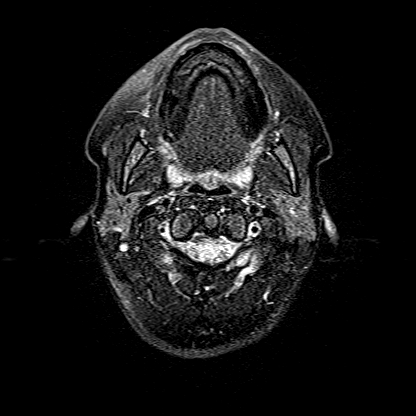
[im 27/53]
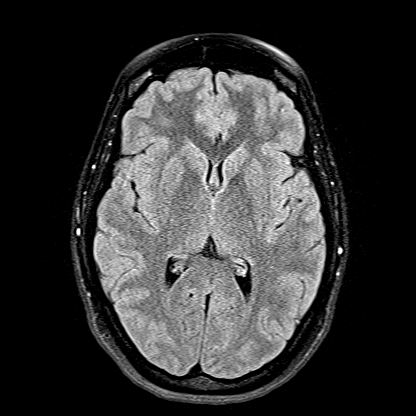
[im 53/53]
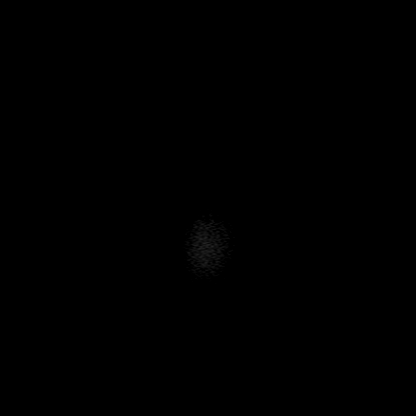

[Series 16: T1 · axial · 1.0mm · 0.98mm/px · z∈[-97,+76]mm · 10 of 172 slices shown (2 of 2)]
[im 1/172]
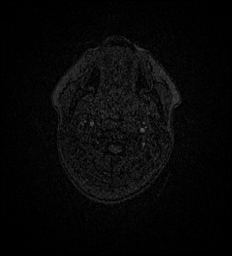
[im 20/172]
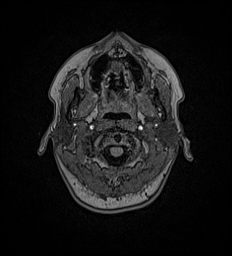
[im 39/172]
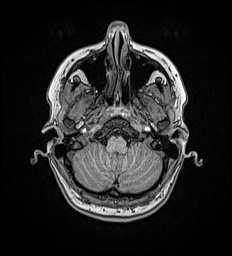
[im 58/172]
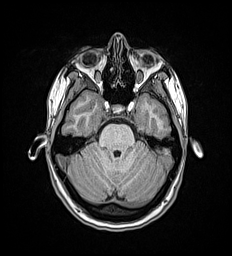
[im 77/172]
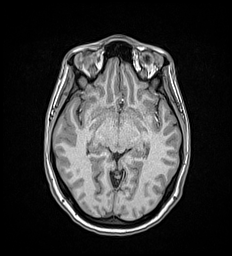
[im 96/172]
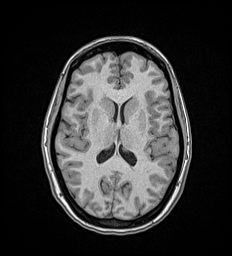
[im 115/172]
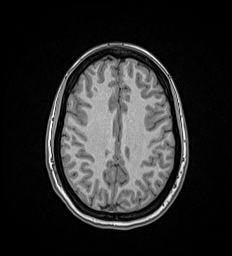
[im 134/172]
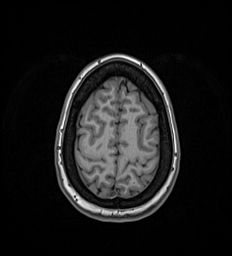
[im 153/172]
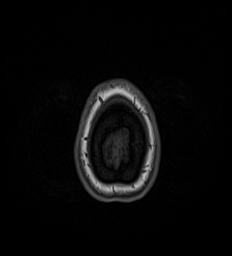
[im 172/172]
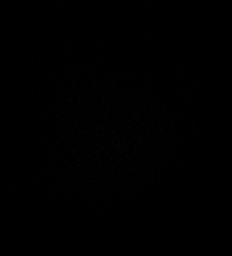

[Series 17: T2 post-contrast · coronal · 5.0mm · 0.57mm/px · 2 of 29 slices shown]
[im 1/29]
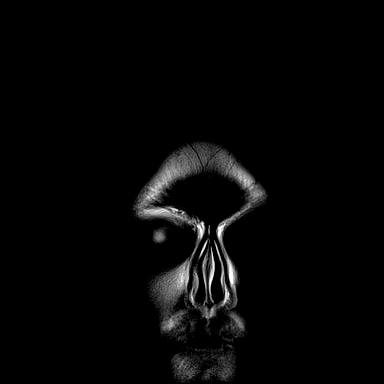
[im 29/29]
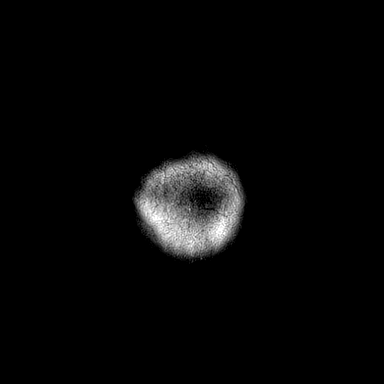

[Series 18: T1 post-contrast · axial · 1.0mm · 0.98mm/px · z∈[-97,+76]mm · 10 of 174 slices shown (1 of 2)]
[im 1/174]
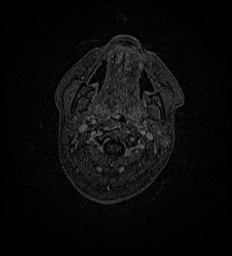
[im 20/174]
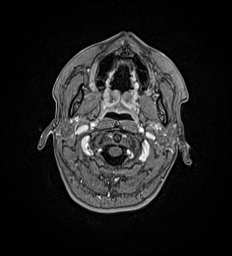
[im 39/174]
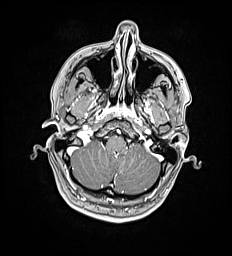
[im 58/174]
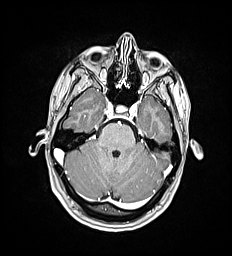
[im 77/174]
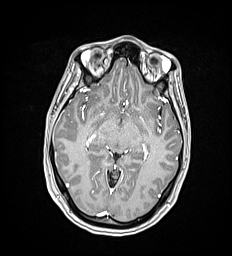
[im 97/174]
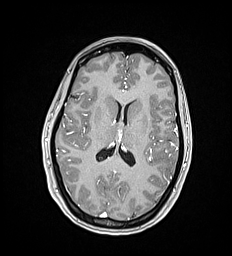
[im 116/174]
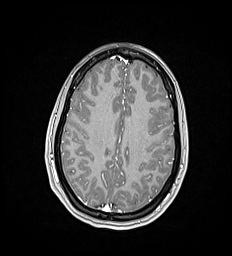
[im 135/174]
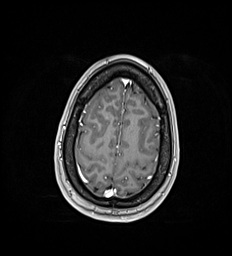
[im 154/174]
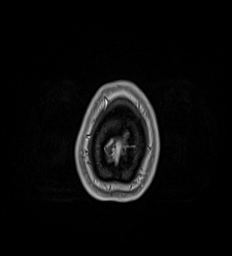
[im 174/174]
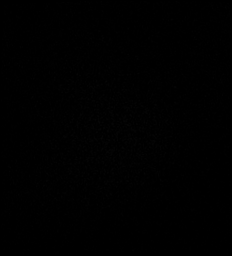

[Series 19: T1 post-contrast · coronal · 5.0mm · 0.57mm/px · 2 of 29 slices shown (2 of 2)]
[im 1/29]
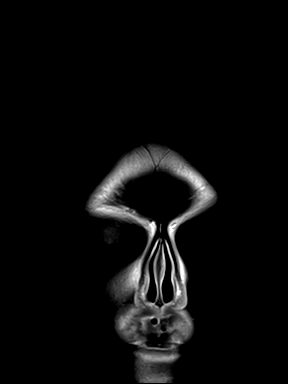
[im 29/29]
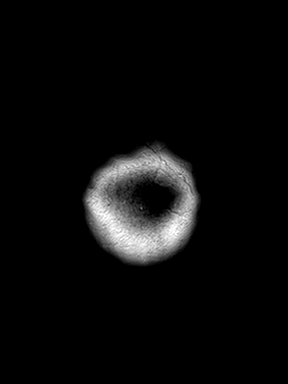

[48 of 48 positions shown; findings below may reference images not displayed]

FINDINGS: Brain:

Cerebral volume is normal.

There is a 2 mm nodular focus of enhancement within the left IAC
fundus (series 18, image 52).

No focal parenchymal signal abnormality or abnormal intracranial
enhancement is identified elsewhere.

There is no acute infarct.

No chronic intracranial blood products.

No extra-axial fluid collection.

No midline shift.

Partially empty sella turcica.

Vascular: Expected proximal arterial flow voids.

Skull and upper cervical spine: No focal marrow lesion.

Sinuses/Orbits: Visualized orbits show no acute finding. Minimal
ethmoid sinus mucosal thickening. No significant mastoid effusion.

These results will be called to the ordering clinician or
representative by the Radiologist Assistant, and communication
documented in the PACS or [REDACTED].
IMPRESSION: 2 mm nodular focus of enhancement within the left internal auditory
canal fundus suspicious for a vestibular schwannoma.

Partially empty sella turcica. This finding is very commonly
incidental, but can be associated with idiopathic intracranial
hypertension.

Otherwise unremarkable MRI appearance of the brain.

Minimal ethmoid sinus mucosal thickening.

## 2020-09-06 MED ORDER — GADOBUTROL 1 MMOL/ML IV SOLN
6.0000 mL | Freq: Once | INTRAVENOUS | Status: AC | PRN
  Administered 2020-09-06: 6 mL via INTRAVENOUS

## 2020-09-06 NOTE — Telephone Encounter (Signed)
Verbal report, Mri head 68mm nodule focus in the left internal auditory canal fundus for fisbutula chswanon. Verbal report called over, report to follow in Epic.

## 2020-09-09 ENCOUNTER — Ambulatory Visit (INDEPENDENT_AMBULATORY_CARE_PROVIDER_SITE_OTHER): Payer: No Typology Code available for payment source

## 2020-09-09 ENCOUNTER — Other Ambulatory Visit: Payer: Self-pay

## 2020-09-09 DIAGNOSIS — I83813 Varicose veins of bilateral lower extremities with pain: Secondary | ICD-10-CM | POA: Diagnosis not present

## 2020-09-13 ENCOUNTER — Other Ambulatory Visit: Payer: Self-pay | Admitting: Neurology

## 2020-09-20 ENCOUNTER — Encounter (INDEPENDENT_AMBULATORY_CARE_PROVIDER_SITE_OTHER): Payer: Self-pay

## 2020-09-22 ENCOUNTER — Encounter (INDEPENDENT_AMBULATORY_CARE_PROVIDER_SITE_OTHER): Payer: Self-pay | Admitting: Vascular Surgery

## 2020-10-07 ENCOUNTER — Other Ambulatory Visit: Payer: Self-pay

## 2020-10-07 ENCOUNTER — Ambulatory Visit (INDEPENDENT_AMBULATORY_CARE_PROVIDER_SITE_OTHER): Payer: No Typology Code available for payment source | Admitting: Vascular Surgery

## 2020-10-07 ENCOUNTER — Encounter (INDEPENDENT_AMBULATORY_CARE_PROVIDER_SITE_OTHER): Payer: Self-pay | Admitting: Vascular Surgery

## 2020-10-07 VITALS — BP 117/79 | HR 90 | Resp 16 | Wt 138.8 lb

## 2020-10-07 DIAGNOSIS — I83813 Varicose veins of bilateral lower extremities with pain: Secondary | ICD-10-CM

## 2020-10-07 DIAGNOSIS — I872 Venous insufficiency (chronic) (peripheral): Secondary | ICD-10-CM

## 2020-10-07 NOTE — Progress Notes (Signed)
MRN : [id number]  Isabel [name] is a 29 y.o. (16-Aug-1991) female who presents with chief complaint of No chief complaint on file. Marland Kitchen  History of Present Illness:   The patient returns to the office for followup status post laser ablation of the right great saphenous vein on 09/05/2020.  The patient note significant improvement in the lower extremity pain but not resolution of the symptoms. The patient notes multiple residual varicosities bilaterally which continued to hurt with dependent positions and remained tender to palpation. The patient's swelling is minimally from preoperative status. The patient continues to wear graduated compression stockings on a daily basis but these are not eliminating the pain and discomfort. The patient continues to use over-the-counter anti-inflammatory medications to treat the pain and related symptoms but this has not given the patient relief. The patient notes the pain in the lower extremities is causing problems with daily exercise, problems at work and even with household activities such as preparing meals and doing dishes.  The patient is otherwise done well and there have been no complications related to the laser procedure or interval changes in the patient's overall   Post laser ultrasound shows successful ablation of the right great saphenous vein    No outpatient medications have been marked as taking for the 10/07/20 encounter (Appointment) with Delana Meyer, Dolores Lory, MD.    Past Medical History:  Diagnosis Date  . Aberrant right subclavian artery   . Anxiety   . Dysmenorrhea   . Family history of breast cancer in mother   . Genetic testing 04/25/18   PALB2 analysis @ [facility] - Familial pathogenic PALB2 mutation detected  . Migraines   . 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 BREAST BIOPSY Right 07/04/2020   stereo  biopsy/ ribbon clip/ path pending  . CHOLECYSTECTOMY      Social History Social History   Tobacco Use  . Smoking status: Current Every Day Smoker    Packs/day: 0.50    Types: Cigarettes  . Smokeless tobacco: Never Used  Vaping Use  . Vaping Use: Former  Substance Use Topics  . Alcohol use: No  . Drug use: Not Currently    Types: Marijuana    Family History Family History  Problem Relation Age of Onset  . Breast cancer Mother 82       currently 68  . Hypertension Father   . Arthritis Other   . Diabetes Other   . Cancer Other   . Diabetes Maternal Grandmother   . Diabetes Maternal Grandfather   . Diabetes Paternal Grandmother   . Diabetes Paternal Grandfather   . Breast cancer Maternal Aunt 79       currently 77; PALB2 mutation  . Colon cancer Neg Hx   . Heart disease Neg Hx     Allergies  Allergen Reactions  . Amoxicillin Hives  . Penicillins Hives     REVIEW OF SYSTEMS (Negative unless checked)  Constitutional: '[]' Weight loss  '[]' Fever  '[]' Chills Cardiac: '[]' Chest pain   '[]' Chest pressure   '[]' Palpitations   '[]' Shortness of breath when laying flat   '[]' Shortness of breath with exertion. Vascular:  '[]' Pain in legs with walking   '[x]' Pain in legs at rest  '[]' History of DVT   '[]' Phlebitis   '[x]' Swelling in legs   '[x]' Varicose veins   '[]' Non-healing ulcers Pulmonary:   '[]' Uses home oxygen   '[]' Productive cough   '[]'   Hemoptysis   '[]' Wheeze  '[]' COPD   '[]' Asthma Neurologic:  '[]' Dizziness   '[]' Seizures   '[]' History of stroke   '[]' History of TIA  '[]' Aphasia   '[]' Vissual changes   '[]' Weakness or numbness in arm   '[]' Weakness or numbness in leg Musculoskeletal:   '[]' Joint swelling   '[]' Joint pain   '[]' Low back pain Hematologic:  '[]' Easy bruising  '[]' Easy bleeding   '[]' Hypercoagulable state   '[]' Anemic Gastrointestinal:  '[]' Diarrhea   '[]' Vomiting  '[]' Gastroesophageal reflux/heartburn   '[]' Difficulty swallowing. Genitourinary:  '[]' Chronic kidney disease   '[]' Difficult urination  '[]' Frequent urination   '[]' Blood in  urine Skin:  '[]' Rashes   '[]' Ulcers  Psychological:  '[]' History of anxiety   '[]'  History of major depression.  Physical Examination  There were no vitals filed for this visit. There is no height or weight on file to calculate BMI. Gen: WD/WN, NAD Head: Power/AT, No temporalis wasting.  Ear/Nose/Throat: Hearing grossly intact, nares w/o erythema or drainage Eyes: PER, EOMI, sclera nonicteric.  Neck: Supple, no large masses.   Pulmonary:  Good air movement, no audible wheezing bilaterally, no use of accessory muscles.  Cardiac: RRR, no JVD Vascular: Large varicosities present extensively greater than 10 mm right lateral thigh and calf.  Mild venous stasis changes to the legs bilaterally.  2+ soft pitting edema Vessel Right Left  Radial Palpable Palpable  Gastrointestinal: Non-distended. No guarding/no peritoneal signs.  Musculoskeletal: M/S 5/5 throughout.  No deformity or atrophy.  Neurologic: CN 2-12 intact. Symmetrical.  Speech is fluent. Motor exam as listed above. Psychiatric: Judgment intact, Mood & affect appropriate for pt's clinical situation. Dermatologic: No rashes or ulcers noted.  No changes consistent with cellulitis.   CBC Lab Results  Component Value Date   WBC 9.2 06/06/2020   HGB 15.6 06/06/2020   HCT 45.1 06/06/2020   MCV 92 06/06/2020   PLT 252 06/06/2020    BMET    Component Value Date/Time   NA 138 06/06/2020 1117   NA 139 12/02/2014 1042   K 4.4 06/06/2020 1117   K 4.1 12/02/2014 1042   CL 102 06/06/2020 1117   CL 106 12/02/2014 1042   CO2 25 06/06/2020 1117   CO2 27 12/02/2014 1042   GLUCOSE 79 06/06/2020 1117   GLUCOSE 92 02/26/2020 1333   GLUCOSE 109 (H) 12/02/2014 1042   BUN 6 06/06/2020 1117   BUN 7 12/02/2014 1042   CREATININE 0.79 06/06/2020 1117   CREATININE 0.89 12/02/2014 1042   CALCIUM 9.4 06/06/2020 1117   CALCIUM 8.8 12/02/2014 1042   GFRNONAA 102 06/06/2020 1117   GFRNONAA >60 12/02/2014 1042   GFRNONAA >60 07/18/2014 0756   GFRAA  118 06/06/2020 1117   GFRAA >60 12/02/2014 1042   GFRAA >60 07/18/2014 0756   CrCl cannot be calculated (Patient's most recent lab result is older than the maximum 21 days allowed.).  COAG No results found for: INR, PROTIME  Radiology VAS [place] LOWER EXTREMITY VENOUS POST ABLATION  Result Date: 09/19/2020  Lower Venous Reflux Study Indications: Varicosities.  Performing Technologist: Blondell Reveal RT, RDMS, RVT  Examination Guidelines: A complete evaluation includes B-mode imaging, spectral Doppler, color Doppler, and power Doppler as needed of all accessible portions of each vessel. Bilateral testing is considered an integral part of a complete examination. Limited examinations for reoccurring indications may be performed as noted. The reflux portion of the exam is performed with the patient in reverse Trendelenburg. Significant venous reflux is defined as >500 ms in the superficial venous system, and >1  second in the deep venous system.  Venous Reflux Times +--------------+---------+------+-----------+------------+------------+ RIGHT         Reflux NoRefluxReflux TimeDiameter cmsComments                             Yes                                      +--------------+---------+------+-----------+------------+------------+ CFV                                                 compressible +--------------+---------+------+-----------+------------+------------+ FV prox                                             compressible +--------------+---------+------+-----------+------------+------------+ FV mid                                              compressible +--------------+---------+------+-----------+------------+------------+ FV dist                                             compressible +--------------+---------+------+-----------+------------+------------+ Popliteal                                           compressible  +--------------+---------+------+-----------+------------+------------+ GSV at SFJ                                          compressible +--------------+---------+------+-----------+------------+------------+ GSV prox thigh                                      occluded     +--------------+---------+------+-----------+------------+------------+ GSV mid thigh                                       occluded     +--------------+---------+------+-----------+------------+------------+ GSV dist thigh                                      occluded     +--------------+---------+------+-----------+------------+------------+ GSV at knee                                         compressible +--------------+---------+------+-----------+------------+------------+ SSV prox calf  compressible +--------------+---------+------+-----------+------------+------------+  Summary: Right: - No evidence of deep vein thrombosis seen in the right lower extremity, from the common femoral through the popliteal veins. - The GSV appears occluded from the distal thigh/proximal knee region to roughly 0.8cm from the SFJ.  *See table(s) above for measurements and observations. Electronically signed by Hortencia Pilar MD on 09/19/2020 at 5:32:01 PM.    Final      Assessment/Plan 1. Varicose veins of bilateral lower extremities with pain Recommend:  The patient has had successful ablation of the previously incompetent saphenous venous system but still has persistent symptoms of pain and swelling that are having a negative impact on daily life and daily activities.  Patient should undergo injection sclerotherapy to treat the residual varicosities.  The risks, benefits and alternative therapies were reviewed in detail with the patient.  All questions were answered.  The patient agrees to proceed with sclerotherapy at their convenience.  The patient will continue  wearing the graduated compression stockings and using the over-the-counter pain medications to treat her symptoms.       2. Chronic venous insufficiency Recommend:  The patient has had successful ablation of the previously incompetent saphenous venous system but still has persistent symptoms of pain and swelling that are having a negative impact on daily life and daily activities.  Patient should undergo injection sclerotherapy to treat the residual varicosities.  The risks, benefits and alternative therapies were reviewed in detail with the patient.  All questions were answered.  The patient agrees to proceed with sclerotherapy at their convenience.  The patient will continue wearing the graduated compression stockings and using the over-the-counter pain medications to treat her symptoms.        Hortencia Pilar, MD  10/07/2020 8:42 AM

## 2020-11-07 ENCOUNTER — Encounter: Payer: No Typology Code available for payment source | Admitting: Obstetrics and Gynecology

## 2020-12-09 ENCOUNTER — Other Ambulatory Visit: Payer: Self-pay | Admitting: Adult Health

## 2020-12-11 ENCOUNTER — Telehealth: Payer: No Typology Code available for payment source | Admitting: Nurse Practitioner

## 2020-12-11 DIAGNOSIS — R059 Cough, unspecified: Secondary | ICD-10-CM

## 2020-12-11 MED ORDER — AZITHROMYCIN 250 MG PO TABS: ORAL_TABLET | ORAL | 0 refills | Status: DC

## 2020-12-11 MED ORDER — BENZONATATE 100 MG PO CAPS: 100.0000 mg | ORAL_CAPSULE | Freq: Three times a day (TID) | ORAL | 0 refills | Status: DC | PRN

## 2020-12-11 NOTE — Progress Notes (Signed)
We are sorry that you are not feeling well.  Here is how we plan to help!  Based on your presentation I believe you most likely have A cough due to bacteria.  When patients have a fever and a productive cough with a change in color or increased sputum production, we are concerned about bacterial bronchitis.  If left untreated it can progress to pneumonia.  If your symptoms do not improve with your treatment plan it is important that you contact your provider.   I have prescribed Azithromyin 250 mg: two tablets now and then one tablet daily for 4 additonal days    In addition you may use A prescription cough medication called Tessalon Perles 100mg. You may take 1-2 capsules every 8 hours as needed for your cough.   From your responses in the eVisit questionnaire you describe inflammation in the upper respiratory tract which is causing a significant cough.  This is commonly called Bronchitis and has four common causes:    Allergies  Viral Infections  Acid Reflux  Bacterial Infection Allergies, viruses and acid reflux are treated by controlling symptoms or eliminating the cause. An example might be a cough caused by taking certain blood pressure medications. You stop the cough by changing the medication. Another example might be a cough caused by acid reflux. Controlling the reflux helps control the cough.  USE OF BRONCHODILATOR ("RESCUE") INHALERS: There is a risk from using your bronchodilator too frequently.  The risk is that over-reliance on a medication which only relaxes the muscles surrounding the breathing tubes can reduce the effectiveness of medications prescribed to reduce swelling and congestion of the tubes themselves.  Although you feel brief relief from the bronchodilator inhaler, your asthma may actually be worsening with the tubes becoming more swollen and filled with mucus.  This can delay other crucial treatments, such as oral steroid medications. If you need to use a  bronchodilator inhaler daily, several times per day, you should discuss this with your provider.  There are probably better treatments that could be used to keep your asthma under control.     HOME CARE . Only take medications as instructed by your medical team. . Complete the entire course of an antibiotic. . Drink plenty of fluids and get plenty of rest. . Avoid close contacts especially the very young and the elderly . Cover your mouth if you cough or cough into your sleeve. . Always remember to wash your hands . A steam or ultrasonic humidifier can help congestion.   GET HELP RIGHT AWAY IF: . You develop worsening fever. . You become short of breath . You cough up blood. . Your symptoms persist after you have completed your treatment plan MAKE SURE YOU   Understand these instructions.  Will watch your condition.  Will get help right away if you are not doing well or get worse.  Your e-visit answers were reviewed by a board certified advanced clinical practitioner to complete your personal care plan.  Depending on the condition, your plan could have included both over the counter or prescription medications. If there is a problem please reply  once you have received a response from your provider. Your safety is important to us.  If you have drug allergies check your prescription carefully.    You can use MyChart to ask questions about today's visit, request a non-urgent call back, or ask for a work or school excuse for 24 hours related to this e-Visit. If it has been   greater than 24 hours you will need to follow up with your provider, or enter a new e-Visit to address those concerns. You will get an e-mail in the next two days asking about your experience.  I hope that your e-visit has been valuable and will speed your recovery. Thank you for using e-visits.  5-10 minutes spent reviewing and documenting in chart.  

## 2020-12-18 ENCOUNTER — Ambulatory Visit (INDEPENDENT_AMBULATORY_CARE_PROVIDER_SITE_OTHER): Payer: No Typology Code available for payment source | Admitting: Vascular Surgery

## 2020-12-18 ENCOUNTER — Other Ambulatory Visit: Payer: Self-pay

## 2020-12-18 ENCOUNTER — Encounter (INDEPENDENT_AMBULATORY_CARE_PROVIDER_SITE_OTHER): Payer: Self-pay | Admitting: Vascular Surgery

## 2020-12-18 VITALS — BP 118/81 | HR 94 | Ht 64.0 in | Wt 152.0 lb

## 2020-12-18 DIAGNOSIS — I83813 Varicose veins of bilateral lower extremities with pain: Secondary | ICD-10-CM

## 2020-12-18 NOTE — Progress Notes (Signed)
Varicose veins of right  lower extremity with inflammation (454.1  I83.10) Current Plans   Indication: Patient presents with symptomatic varicose veins of the right  lower extremity.   Procedure: Sclerotherapy using hypertonic saline mixed with 1% Lidocaine was performed on the right lower extremity. Compression wraps were placed. The patient tolerated the procedure well. 

## 2021-01-12 ENCOUNTER — Other Ambulatory Visit: Payer: Self-pay | Admitting: Adult Health

## 2021-01-12 ENCOUNTER — Other Ambulatory Visit: Payer: Self-pay | Admitting: Neurology

## 2021-01-12 DIAGNOSIS — H6504 Acute serous otitis media, recurrent, right ear: Secondary | ICD-10-CM

## 2021-01-12 DIAGNOSIS — F419 Anxiety disorder, unspecified: Secondary | ICD-10-CM

## 2021-01-12 NOTE — Telephone Encounter (Signed)
Requested Prescriptions  Pending Prescriptions Disp Refills  . buPROPion (WELLBUTRIN SR) 150 MG 12 hr tablet [Pharmacy Med Name: BUPROPION HCL SR 150 MG TABLET] 30 tablet 0    Sig: TAKE 1 TABLET BY MOUTH EVERY DAY     Psychiatry: Antidepressants - bupropion Failed - 01/12/2021  9:20 AM      Failed - Valid encounter within last 6 months    Recent Outpatient Visits          7 months ago Recurrent acute serous otitis media of right ear   [facility] Flinchum, Kelby Aline, FNP   9 months ago Recurrent acute serous otitis media of right ear   [facility] Flinchum, Kelby Aline, FNP   10 months ago Aberrant right subclavian artery- noted on CT angiogram    [facility] Flinchum, Kelby Aline, FNP   10 months ago Epigastric abdominal pain   [facility] Fl[facility]um, Kelby Aline, FNP      Future Appointments            In 3 weeks [name], MD [facility] - Completed PHQ-2 or PHQ-9 in the last 360 days      Passed - Last BP in normal range    BP Readings from Last 1 Encounters:  12/18/20 118/81

## 2021-01-15 ENCOUNTER — Ambulatory Visit (INDEPENDENT_AMBULATORY_CARE_PROVIDER_SITE_OTHER): Payer: No Typology Code available for payment source | Admitting: Vascular Surgery

## 2021-01-17 NOTE — Progress Notes (Signed)
NEUROLOGY FOLLOW UP OFFICE NOTE  Isabel [name] [id number]  Assessment/Plan:   Chronic Migraine without Aura, without Status Migrainosus - much improved but has caused weight gain.  Will switch to a CGRP inhibitor  1.  Migraine prevention:  Aimovig 160m every 28 days 2.  Migraine rescue:  Start rizatriptan 116m3.  Limit use of pain relievers to no more than 2 days out of week to prevent risk of rebound or medication-overuse headache. 4.  Keep headache diary  Subjective:  Isabel [name] a [age]ear old right-handed [name] who follows up for migraines.  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  She does endorse mild transient tinnitus  Referred to ophthalmology.  She saw Dr. WeKathlen Modyn November.  Exhibited dry eyes but no papilledema.    Started topiramate in September.  She had a rash and was switched to nortriptyline.  Improved but she has gained 20 lbs.   Intensity:  Mild, severe Duration:  Mild last 30 minutes, severe last 6 hours Frequency:  15 headache days (12 are mild and can function, 3 are severe and cannot function) Frequency of abortive medication: 1 to 2 days a month Current NSAIDS/analgesics:  Children's tylenol Current triptans:  none Current ergotamine:  none Current anti-emetic:  none Current muscle relaxants:  none Current Antihypertensive medications:  none Current Antidepressant medications:  Nortriptyline 1042mt bedtime, Wellbutrin Current Anticonvulsant medications:  gabapentin 200m52mD (sciatica) Current anti-CGRP:  none Current Vitamins/Herbal/Supplements:  none Current Antihistamines/Decongestants:  none Other therapy:  none  HISTORY: She has remote history of migraines several years ago but then resolved.  She started getting  new headaches in 2021.  She describes a diffuse severe throbbing headache with pins and needles sensation around her right eye with severe right eye pressure, that sometimes radiates to back of her neck.  It occurs daily but fluctuates.  Sometimes she sees spots in both eyes.  She sometimes has nausea but no nausea.  Photophobia but no photophobia.   Sometimes she gets a paroxysmal stabbing pain in the mild-occipital region, occurring every now and then.  She started feeling numbness and tingling in the arms.  MRI of cervical spine from 03/17/2020 was unremarkable.  However, it did show evidence of empty sella.  MRA of neck was negative. She subsequently saw a chiropractor and neck pain and arm numbness has improved.  She was treated for recurrent ear infections.  She subsequently saw ENT who told her she did not have ear infection but that she should see neurology due to the empty sella finding on cervical MRI.  She denies visual obscurations and pulsatile tinnitus.  She does have high-frequency tinnitus.  Past migraine prevention:  Topiramate 25mg43msh)   PAST MEDICAL HISTORY: Past Medical History:  Diagnosis Date  . Aberrant right subclavian artery   . Anxiety   . Dysmenorrhea   . Family history of breast cancer in mother   . Genetic testing 04/25/18   PALB2 analysis @ [facility] - Familial pathogenic PALB2 mutation detected  . Migraines   . 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 azithromycin (ZITHROMAX Z-PAK) 250 MG tablet As directed (Patient not taking:  Reported on 12/18/2020) 6 tablet 0  . benzonatate (TESSALON PERLES) 100 MG capsule Take 1 capsule (100 mg total) by mouth 3 (three) times daily as needed for cough. (Patient not taking: Reported on 12/18/2020) 20 capsule 0  . buPROPion (WELLBUTRIN SR) 150 MG 12 hr tablet TAKE 1  TABLET BY MOUTH EVERY DAY 30 tablet 0  . diazepam (VALIUM) 5 MG tablet Take 1 tablet 40 to 60 minutes prior to MRI (Patient not taking: Reported on 12/18/2020) 1 tablet 0  . gabapentin (NEURONTIN) 100 MG capsule TAKE 2 CAPSULES (200 MG TOTAL) BY MOUTH 2 (TWO) TIMES DAILY. 360 capsule 0  . nortriptyline (PAMELOR) 10 MG capsule TAKE 1 CAPSULE BY MOUTH AT BEDTIME. 90 capsule 2  . topiramate (TOPAMAX) 25 MG tablet Take 1 tablet (25 mg total) by mouth at bedtime. 30 tablet 5  . [DISCONTINUED] albuterol (VENTOLIN HFA) 108 (90 Base) MCG/ACT inhaler Inhale 2 puffs into the lungs every 4 (four) hours as needed for wheezing or shortness of breath. (Patient not taking: Reported on 02/26/2020) 18 g 0  . [DISCONTINUED] famotidine (PEPCID) 20 MG tablet Take 1 tablet (20 mg total) by mouth 2 (two) times daily. 60 tablet 0   No current facility-administered medications on file prior to visit.    ALLERGIES: Allergies  Allergen Reactions  . Amoxicillin Hives  . Penicillins Hives    FAMILY HISTORY: Family History  Problem Relation Age of Onset  . Breast cancer Mother 70       currently 36  . Hypertension Father   . Arthritis Other   . Diabetes Other   . Cancer Other   . Diabetes Maternal Grandmother   . Diabetes Maternal Grandfather   . Diabetes Paternal Grandmother   . Diabetes Paternal Grandfather   . Breast cancer Maternal Aunt 7       currently 64; PALB2 mutation  . Colon cancer Neg Hx   . Heart disease Neg Hx       Objective:  Blood pressure 121/84, pulse 80, resp. rate 18, height '5\' 4"'  (1.626 m), weight 161 lb (73 kg), SpO2 98 %. General: No acute distress.  Patient appears well-groomed.      Metta Clines, DO  CC: Isabel Peace, FNP

## 2021-01-20 ENCOUNTER — Other Ambulatory Visit: Payer: Self-pay

## 2021-01-20 ENCOUNTER — Ambulatory Visit: Payer: No Typology Code available for payment source | Admitting: Neurology

## 2021-01-20 ENCOUNTER — Encounter: Payer: Self-pay | Admitting: Neurology

## 2021-01-20 VITALS — BP 121/84 | HR 80 | Resp 18 | Ht 64.0 in | Wt 161.0 lb

## 2021-01-20 DIAGNOSIS — G43709 Chronic migraine without aura, not intractable, without status migrainosus: Secondary | ICD-10-CM

## 2021-01-20 MED ORDER — AIMOVIG 140 MG/ML ~~LOC~~ SOAJ: 140.0000 mg | SUBCUTANEOUS | 5 refills | Status: DC

## 2021-01-20 MED ORDER — RIZATRIPTAN BENZOATE 10 MG PO TABS: ORAL_TABLET | ORAL | 5 refills | Status: DC

## 2021-01-20 NOTE — Patient Instructions (Signed)
  1. Stop nortriptyline.  Start Aimovig 140mg  injection every 28 days.  If insurance prefers an alternative injection, will then send that in instead 2. Take rizatriptan 10mg  at earliest onset of headache.  May repeat dose once in 2 hours if needed.  Maximum 2 tablets in 24 hours. 3. Limit use of pain relievers to no more than 2 days out of the week.  These medications include acetaminophen, NSAIDs (ibuprofen/Advil/Motrin, naproxen/Aleve, triptans (Imitrex/sumatriptan), Excedrin, and narcotics.  This will help reduce risk of rebound headaches. 4. Be aware of common food triggers:  - Caffeine:  coffee, black tea, cola, Mt. Dew  - Chocolate  - Dairy:  aged cheeses (brie, blue, cheddar, gouda, [place], provolone, [place], Swiss, etc), chocolate milk, buttermilk, sour cream, limit eggs and yogurt  - Nuts, peanut butter  - Alcohol  - Cereals/grains:  FRESH breads (fresh bagels, sourdough, doughnuts), yeast productions  - Processed/canned/aged/cured meats (pre-packaged deli meats, hotdogs)  - MSG/glutamate:  soy sauce, flavor enhancer, pickled/preserved/marinated foods  - Sweeteners:  aspartame (Equal, Nutrasweet).  Sugar and Splenda are okay  - Vegetables:  legumes (lima beans, lentils, snow peas, fava beans, pinto peans, peas, garbanzo beans), sauerkraut, onions, olives, pickles  - Fruit:  avocados, bananas, citrus fruit (orange, lemon, grapefruit), mango  - Other:  Frozen meals, macaroni and cheese 5. Routine exercise 6. Stay adequately hydrated (aim for 64 oz water daily) 7. Keep headache diary 8. Maintain proper stress management 9. Maintain proper sleep hygiene 10. Do not skip meals 11. Consider supplements:  magnesium citrate 400mg  daily, riboflavin 400mg  daily, coenzyme Q10 100mg  three times daily. 12  Follow up 4 to 6 months.

## 2021-01-24 ENCOUNTER — Encounter: Payer: Self-pay | Admitting: Neurology

## 2021-01-24 NOTE — Progress Notes (Signed)
Received fax approval from [employer] benefits for Aimovig medication valid until 02/06/22. Scanned into chart.

## 2021-02-06 ENCOUNTER — Other Ambulatory Visit: Payer: Self-pay | Admitting: Adult Health

## 2021-02-06 ENCOUNTER — Ambulatory Visit (INDEPENDENT_AMBULATORY_CARE_PROVIDER_SITE_OTHER): Payer: PRIVATE HEALTH INSURANCE | Admitting: Obstetrics and Gynecology

## 2021-02-06 ENCOUNTER — Other Ambulatory Visit (HOSPITAL_COMMUNITY)
Admission: RE | Admit: 2021-02-06 | Discharge: 2021-02-06 | Disposition: A | Discharge status: Home / Self Care | Payer: No Typology Code available for payment source | Source: Ambulatory Visit | Attending: Obstetrics and Gynecology | Admitting: Obstetrics and Gynecology

## 2021-02-06 ENCOUNTER — Other Ambulatory Visit: Payer: Self-pay

## 2021-02-06 ENCOUNTER — Encounter: Payer: Self-pay | Admitting: Obstetrics and Gynecology

## 2021-02-06 VITALS — BP 126/86 | HR 81 | Ht 64.0 in | Wt 161.8 lb

## 2021-02-06 DIAGNOSIS — N943 Premenstrual tension syndrome: Secondary | ICD-10-CM

## 2021-02-06 DIAGNOSIS — Z1501 Genetic susceptibility to malignant neoplasm of breast: Secondary | ICD-10-CM | POA: Diagnosis not present

## 2021-02-06 DIAGNOSIS — Z01419 Encounter for gynecological examination (general) (routine) without abnormal findings: Secondary | ICD-10-CM | POA: Diagnosis not present

## 2021-02-06 DIAGNOSIS — H6504 Acute serous otitis media, recurrent, right ear: Secondary | ICD-10-CM

## 2021-02-06 DIAGNOSIS — Z124 Encounter for screening for malignant neoplasm of cervix: Secondary | ICD-10-CM

## 2021-02-06 DIAGNOSIS — G43109 Migraine with aura, not intractable, without status migrainosus: Secondary | ICD-10-CM

## 2021-02-06 DIAGNOSIS — F419 Anxiety disorder, unspecified: Secondary | ICD-10-CM

## 2021-02-06 DIAGNOSIS — Z803 Family history of malignant neoplasm of breast: Secondary | ICD-10-CM | POA: Diagnosis not present

## 2021-02-06 NOTE — Telephone Encounter (Signed)
Requested medications are due for refill today.  yes  Requested medications are on the active medications list.  yes  Last refill. 01/12/2021  Future visit scheduled.   no  Notes to clinic.  Courtesy refill already given.

## 2021-02-06 NOTE — Telephone Encounter (Signed)
Needs follow up appointment virtual or in person. Depression/ anxiety.

## 2021-02-06 NOTE — Progress Notes (Signed)
Annual Exam-Pt stated that she was doing well. PHQ-9=3. GAD-7= 6.

## 2021-02-06 NOTE — Progress Notes (Signed)
GYNECOLOGY ANNUAL PHYSICAL EXAM PROGRESS NOTE  Subjective:    Isabel [name] is a 30 y.o. [phone] female with family history of breast cancer who presents for an annual exam.  The patient is sexually active. The patient wears seatbelts: yes. The patient participates in regular exercise: no. Has the patient ever been transfused or tattooed?: no. The patient reports that there is not domestic violence in her life.   The patient has the following complaints today:  1. She notes that she has recently been diagnosed with migraines.  She has been undergoing evaluation and management with [facility] with adjustments in medications.  2. She reports that her menstrual cycles are fairly heavy. Has had issues with nausea/vomiting with onset of menses.  Was also previously noting worsening of varicose veins just before an during cycle, recently had vein stripping performed.  3. [name] reports cessation of smoking.  Is now using Delta-9.  4. Lastly, she reports issues with weight gain. States that since starting her migraine meds (specifically Nortriptyline), has noticed a 30 lb weight gain.     Gynecologic History Menarche age: 58 Patient's last menstrual period was 02/02/2021. Contraception: Phexxi vaginal inserts History of STI's: Denies Last Pap: 07/13/2018. Results were: normal.  History of abnormal (LGSIL) pap in 2017.   Last mammogram: 06/18/2020 (5 mm group of indeterminate amorphous calcifications in the RIGHT breast at 11 o'clock middle depth), s/p stereotactic biopsy.  Also with s/p MRI on 08/05/2020, normal.   OB History  Gravida Para Term Preterm AB Living  _0 0 2 1  SAB IAB Ectopic Multiple Live Births  2 0 0 0 1    # Outcome Date GA Lbr Len/2nd Weight Sex Delivery Anes PTL Lv  4 Term 11/02/16 [redacted]w[redacted]d/ 00:09 6 lb 8.4 oz (2.96 kg) M Vag-Spont None  LIV     Name: Isabel [name]     Apgar1: 8  Apgar5: 9  3 SAB 2016 579w0d       2 SAB 2016 6w22w0d      1 Term 2011 40w47w2d lb 1.8 oz (3.225 kg) F Vag-Spont EPI  LIV    Past Medical History:  Diagnosis Date  . Aberrant right subclavian artery   . Anxiety   . Dysmenorrhea   . Family history of breast cancer in mother   . Genetic testing 04/25/18   PALB2 analysis @ [facility] - Familial pathogenic PALB2 mutation detected  . Migraines   . 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 BREAST BIOPSY Right 07/04/2020   stereo biopsy/ ribbon clip/ path pending  . CHOLECYSTECTOMY      Family History  Problem Relation Age of Onset  . Breast cancer Mother 39  48   currently 48  56Hypertension Father   . Arthritis Other   . Diabetes Other   . Cancer Other   . Diabetes Maternal Grandmother   . Diabetes Maternal Grandfather   . Diabetes Paternal Grandmother   . Diabetes Paternal Grandfather   . Breast cancer Maternal Aunt 50  93   currently 50; 105LB2 mutation  . Colon cancer Neg Hx   . Heart disease Neg Hx     Social History   Socioeconomic History  . Marital status: Single    Spouse name: Not on file  . Number of  children: Not on file  . Years of education: 8  . Highest education level: Not on file  Occupational History  . Not on file  Tobacco Use  . Smoking status: Current Every Day Smoker    Packs/day: 0.50    Types: Cigarettes  . Smokeless tobacco: Never Used  Vaping Use  . Vaping Use: Former  Substance and Sexual Activity  . Alcohol use: No  . Drug use: Not Currently    Types: Marijuana  . Sexual activity: Yes    Birth control/protection: None, Condom, I.U.D.    Comment: Husband plans vasectomy   Other Topics Concern  . Not on file  Social History Narrative   Right handed   Lives alone one story   Social Determinants of Health   Financial Resource Strain: Not on file  Food Insecurity: Not on file  Transportation Needs: Not on file  Physical Activity: Not on file   Stress: Not on file  Social Connections: Not on file  Intimate Partner Violence: Not on file    Current Outpatient Medications on File Prior to Visit  Medication Sig Dispense Refill  . buPROPion (WELLBUTRIN SR) 150 MG 12 hr tablet TAKE 1 TABLET BY MOUTH EVERY DAY 30 tablet 0  . gabapentin (NEURONTIN) 100 MG capsule TAKE 2 CAPSULES (200 MG TOTAL) BY MOUTH 2 (TWO) TIMES DAILY. 360 capsule 0  . rizatriptan (MAXALT) 10 MG tablet Take 1 tablet earliest onset of migraine.  May repeat in 2 hours if needed.  Maximum 2 tablets in 24 hours 10 tablet 5  . Erenumab-aooe (AIMOVIG) 140 MG/ML SOAJ Inject 140 mg into the skin every 28 (twenty-eight) days. (Patient not taking: Reported on 02/06/2021) 1.12 mL 5  . [DISCONTINUED] albuterol (VENTOLIN HFA) 108 (90 Base) MCG/ACT inhaler Inhale 2 puffs into the lungs every 4 (four) hours as needed for wheezing or shortness of breath. (Patient not taking: Reported on 02/26/2020) 18 g 0  . [DISCONTINUED] famotidine (PEPCID) 20 MG tablet Take 1 tablet (20 mg total) by mouth 2 (two) times daily. 60 tablet 0   No current facility-administered medications on file prior to visit.    Allergies  Allergen Reactions  . Amoxicillin Hives  . Penicillins Hives      Review of Systems Constitutional: negative for chills, fatigue, fevers and sweats Eyes: negative for irritation, redness and visual disturbance Ears, nose, mouth, throat, and face: negative for hearing loss, nasal congestion, snoring and tinnitus Respiratory: negative for asthma, cough, sputum Cardiovascular: negative for chest pain, dyspnea, exertional chest pressure/discomfort, irregular heart beat, palpitations and syncope Gastrointestinal: negative for abdominal pain, change in bowel habits, nausea and vomiting Genitourinary: positive for abnormal menstrual periods (heavy) associated with nausea/vomiting. Negative genital lesions, sexual problems and vaginal discharge, dysuria and urinary  incontinence Integument/breast: negative for breast lump, breast tenderness and nipple discharge Hematologic/lymphatic: negative for bleeding and easy bruising Musculoskeletal:negative for back pain and muscle weakness Neurological: negative for dizziness, headaches, vertigo and weakness Endocrine: negative for diabetic symptoms including polydipsia, polyuria and skin dryness Allergic/Immunologic: negative for hay fever and urticaria        Objective:  Blood pressure 126/86, pulse 81, height 5' 4" (1.626 m), weight 161 lb 12.8 oz (73.4 kg), last menstrual period 02/02/2021. Body mass index is 27.77 kg/m.  General Appearance:    Alert, cooperative, no distress, appears stated age, overweight  Head:    Normocephalic, without obvious abnormality, atraumatic  Eyes:    PERRL, conjunctiva/corneas clear, EOM's intact, both eyes  Ears:  Normal external ear canals, both ears  Nose:   Nares normal, septum midline, mucosa normal, no drainage or sinus tenderness  Throat:   Lips, mucosa, and tongue normal; teeth and gums normal  Neck:   Supple, symmetrical, trachea midline, no adenopathy; thyroid: no enlargement/tenderness/nodules; no carotid bruit or JVD  Back:     Symmetric, no curvature, ROM normal, no CVA tenderness  Lungs:     Clear to auscultation bilaterally, respirations unlabored  Chest Wall:    No tenderness or deformity.     Heart:    Regular rate and rhythm, S1 and S2 normal, no murmur, rub or gallop  Breast Exam:    No tenderness, or nipple abnormality.  Positive for ~ 1 cm nodule present at 2-3 o'clock, mobile.  Abdomen:     Soft, non-tender, bowel sounds active all four quadrants, no masses, no organomegaly.    Genitalia:    Pelvic:external genitalia normal, vagina without lesions, discharge, or tenderness. Rectovaginal septum  normal. Cervix normal in appearance, no cervical motion tenderness, no adnexal masses or tenderness.  IUD threads visible, ~ 3 cm from cervical os. Uterus  normal size, shape, mobile, regular contours, nontender.  Rectal:    Normal external sphincter.  No hemorrhoids appreciated. Internal exam not done.   Extremities:   Extremities normal, atraumatic, no cyanosis or edema  Pulses:   2+ and symmetric all extremities  Skin:   Skin color, texture, turgor normal, no rashes or lesions  Lymph nodes:   Cervical, supraclavicular, and axillary nodes normal  Neurologic:   CNII-XII intact, normal strength, sensation and reflexes throughout   .  Labs:  Lab Results  Component Value Date   WBC 9.2 06/06/2020   HGB 15.6 06/06/2020   HCT 45.1 06/06/2020   MCV 92 06/06/2020   PLT 252 06/06/2020    Lab Results  Component Value Date   CREATININE 0.79 06/06/2020   BUN 6 06/06/2020   NA 138 06/06/2020   K 4.4 06/06/2020   CL 102 06/06/2020   CO2 25 06/06/2020    Lab Results  Component Value Date   ALT 9 06/06/2020   AST 11 06/06/2020   ALKPHOS 64 06/06/2020   BILITOT 0.3 06/06/2020    Lab Results  Component Value Date   TSH 1.338 02/26/2020     Assessment:   Well woman exam with routine gynecological exam  Family history of breast cancer in mother Positive test for genetic marker of susceptibility to malignant neoplasm of breast Heavy menses PMS symptoms Migraine with aura  Plan:    - Blood tests: Labs reviewed. - Breast self exam technique reviewed and patient encouraged to perform self-exam monthly.   Mammogram ordered. Also will alternate with MRIs every 6 months. - Contraception: Phexxi vaginal inserts.  Discussed option of use of other progesterone-only methods to help manage cycles as well as PMS symptoms.   - Discussed healthy lifestyle modifications. Offered trial of Slynd. Given samples for 2 months. To notify MD if prescription is desired.  - Pap smear performed today.  Continue routine screening. - Family h/o breast cancer (hereditary) - Patient has undergone genetic testing (positive for PALB2 gene). Had mammogram  performed last year due due to breast complaints, as well as for initiation of screening due to family history and genetics.  Also alternating with MRI every 6 months. She can also consider risk-reducing bilateral mastectomy. She has also been previously counseled regarding her risk of ovarian cancer (although no specific risk known with current  gene), but may consider risk-reducing BSO based on age and other factors. Patient would like consultation with General Surgery due to other "knot" noted in chest/breast area.  - H/o migraines, currently managed by Neurology. Weight gain noted from medication. Discussed options for magement.  - Follow up in 1 year for annual exam.    [name], MD [facility]

## 2021-02-06 NOTE — Telephone Encounter (Signed)
Pt called and I did not see this message.  Called pt back and it went straight to VM.  Left message to cb and make follow up appt for refill request.

## 2021-02-07 NOTE — Progress Notes (Incomplete)
GYNECOLOGY ANNUAL PHYSICAL EXAM PROGRESS NOTE  Subjective:    Isabel [name] is a 30 y.o. [phone] female with family history of breast cancer who presents for an annual exam.  The patient is sexually active. The patient wears seatbelts: yes. The patient participates in regular exercise: no. Has the patient ever been transfused or tattooed?: no. The patient reports that there is not domestic violence in her life.   The patient has the following complaints today:  1. She notes that she has recently been diagnosed with migraines.  She has been undergoing evaluation and management with [facility] with adjustments in medications.  2. She reports that her menstrual cycles are fairly heavy. Has had issues with nausea/vomiting with onset of menses.  Was also previously noting worsening of varicose veins just before an during cycle, recently had vein stripping performed.  3. Isabel [name] reports cessation of smoking.  Is now using Delta-9.  4. Lastly, she reports issues with weight gain. States that since starting her migraine meds (specifically Nortriptyline), has noticed a 30 lb weight gain.     Gynecologic History Menarche age: 58 Patient's last menstrual period was 02/02/2021. Contraception: Phexxi vaginal inserts History of STI's: Denies Last Pap: 07/13/2018. Results were: normal.  History of abnormal (LGSIL) pap in 2017.   Last mammogram: 06/18/2020 (5 mm group of indeterminate amorphous calcifications in the RIGHT breast at 11 o'clock middle depth), s/p stereotactic biopsy.  Also with s/p MRI on 08/05/2020, normal.   OB History  Gravida Para Term Preterm AB Living  _0 0 2 1  SAB IAB Ectopic Multiple Live Births  2 0 0 0 1    # Outcome Date GA Lbr Len/2nd Weight Sex Delivery Anes PTL Lv  4 Term 11/02/16 [redacted]w[redacted]d/ 00:09 6 lb 8.4 oz (2.96 kg) M Vag-Spont None  LIV     Name: Isabel [name]     Apgar1: 8  Apgar5: 9  3 SAB 2016 579w0d       2 SAB 2016 6w22w0d      1 Term 2011 40w47w2d lb 1.8 oz (3.225 kg) F Vag-Spont EPI  LIV    Past Medical History:  Diagnosis Date  . Aberrant right subclavian artery   . Anxiety   . Dysmenorrhea   . Family history of breast cancer in mother   . Genetic testing 04/25/18   PALB2 analysis @ [facility] - Familial pathogenic PALB2 mutation detected  . Migraines   . 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 BREAST BIOPSY Right 07/04/2020   stereo biopsy/ ribbon clip/ path pending  . CHOLECYSTECTOMY      Family History  Problem Relation Age of Onset  . Breast cancer Mother 39  48   currently 48  56Hypertension Father   . Arthritis Other   . Diabetes Other   . Cancer Other   . Diabetes Maternal Grandmother   . Diabetes Maternal Grandfather   . Diabetes Paternal Grandmother   . Diabetes Paternal Grandfather   . Breast cancer Maternal Aunt 50  93   currently 50; 105LB2 mutation  . Colon cancer Neg Hx   . Heart disease Neg Hx     Social History   Socioeconomic History  . Marital status: Single    Spouse name: Not on file  . Number of  children: Not on file  . Years of education: 43  . Highest education level: Not on file  Occupational History  . Not on file  Tobacco Use  . Smoking status: Current Every Day Smoker    Packs/day: 0.50    Types: Cigarettes  . Smokeless tobacco: Never Used  Vaping Use  . Vaping Use: Former  Substance and Sexual Activity  . Alcohol use: No  . Drug use: Not Currently    Types: Marijuana  . Sexual activity: Yes    Birth control/protection: None, Condom, I.U.D.    Comment: Husband plans vasectomy   Other Topics Concern  . Not on file  Social History Narrative   Right handed   Lives alone one story   Social Determinants of Health   Financial Resource Strain: Not on file  Food Insecurity: Not on file  Transportation Needs: Not on file  Physical Activity: Not on file   Stress: Not on file  Social Connections: Not on file  Intimate Partner Violence: Not on file    Current Outpatient Medications on File Prior to Visit  Medication Sig Dispense Refill  . buPROPion (WELLBUTRIN SR) 150 MG 12 hr tablet TAKE 1 TABLET BY MOUTH EVERY DAY 30 tablet 0  . gabapentin (NEURONTIN) 100 MG capsule TAKE 2 CAPSULES (200 MG TOTAL) BY MOUTH 2 (TWO) TIMES DAILY. 360 capsule 0  . rizatriptan (MAXALT) 10 MG tablet Take 1 tablet earliest onset of migraine.  May repeat in 2 hours if needed.  Maximum 2 tablets in 24 hours 10 tablet 5  . Erenumab-aooe (AIMOVIG) 140 MG/ML SOAJ Inject 140 mg into the skin every 28 (twenty-eight) days. (Patient not taking: Reported on 02/06/2021) 1.12 mL 5  . [DISCONTINUED] albuterol (VENTOLIN HFA) 108 (90 Base) MCG/ACT inhaler Inhale 2 puffs into the lungs every 4 (four) hours as needed for wheezing or shortness of breath. (Patient not taking: Reported on 02/26/2020) 18 g 0  . [DISCONTINUED] famotidine (PEPCID) 20 MG tablet Take 1 tablet (20 mg total) by mouth 2 (two) times daily. 60 tablet 0   No current facility-administered medications on file prior to visit.    Allergies  Allergen Reactions  . Amoxicillin Hives  . Penicillins Hives      Review of Systems Constitutional: negative for chills, fatigue, fevers and sweats Eyes: negative for irritation, redness and visual disturbance Ears, nose, mouth, throat, and face: negative for hearing loss, nasal congestion, snoring and tinnitus Respiratory: negative for asthma, cough, sputum Cardiovascular: negative for chest pain, dyspnea, exertional chest pressure/discomfort, irregular heart beat, palpitations and syncope Gastrointestinal: negative for abdominal pain, change in bowel habits, nausea and vomiting Genitourinary: positive for abnormal menstrual periods (heavy) associated with nausea/vomiting. Negative genital lesions, sexual problems and vaginal discharge, dysuria and urinary  incontinence Integument/breast: negative for breast lump, breast tenderness and nipple discharge Hematologic/lymphatic: negative for bleeding and easy bruising Musculoskeletal:negative for back pain and muscle weakness Neurological: negative for dizziness, headaches, vertigo and weakness Endocrine: negative for diabetic symptoms including polydipsia, polyuria and skin dryness Allergic/Immunologic: negative for hay fever and urticaria        Objective:  Blood pressure 126/86, pulse 81, height 5' 4" (1.626 m), weight 161 lb 12.8 oz (73.4 kg), last menstrual period 02/02/2021. Body mass index is 27.77 kg/m.  General Appearance:    Alert, cooperative, no distress, appears stated age, overweight  Head:    Normocephalic, without obvious abnormality, atraumatic  Eyes:    PERRL, conjunctiva/corneas clear, EOM's intact, both eyes  Ears:  Normal external ear canals, both ears  Nose:   Nares normal, septum midline, mucosa normal, no drainage or sinus tenderness  Throat:   Lips, mucosa, and tongue normal; teeth and gums normal  Neck:   Supple, symmetrical, trachea midline, no adenopathy; thyroid: no enlargement/tenderness/nodules; no carotid bruit or JVD  Back:     Symmetric, no curvature, ROM normal, no CVA tenderness  Lungs:     Clear to auscultation bilaterally, respirations unlabored  Chest Wall:    No tenderness or deformity   Heart:    Regular rate and rhythm, S1 and S2 normal, no murmur, rub or gallop  Breast Exam:    No tenderness, masses, or nipple abnormality  Abdomen:     Soft, non-tender, bowel sounds active all four quadrants, no masses, no organomegaly.    Genitalia:    Pelvic:external genitalia normal, vagina without lesions, discharge, or tenderness. Rectovaginal septum  normal. Cervix normal in appearance, no cervical motion tenderness, no adnexal masses or tenderness.  IUD threads visible, ~ 3 cm from cervical os. Uterus normal size, shape, mobile, regular contours, nontender.   Rectal:    Normal external sphincter.  No hemorrhoids appreciated. Internal exam not done.   Extremities:   Extremities normal, atraumatic, no cyanosis or edema  Pulses:   2+ and symmetric all extremities  Skin:   Skin color, texture, turgor normal, no rashes or lesions  Lymph nodes:   Cervical, supraclavicular, and axillary nodes normal  Neurologic:   CNII-XII intact, normal strength, sensation and reflexes throughout   .  Labs:  Lab Results  Component Value Date   WBC 9.2 06/06/2020   HGB 15.6 06/06/2020   HCT 45.1 06/06/2020   MCV 92 06/06/2020   PLT 252 06/06/2020    Lab Results  Component Value Date   CREATININE 0.79 06/06/2020   BUN 6 06/06/2020   NA 138 06/06/2020   K 4.4 06/06/2020   CL 102 06/06/2020   CO2 25 06/06/2020    Lab Results  Component Value Date   ALT 9 06/06/2020   AST 11 06/06/2020   ALKPHOS 64 06/06/2020   BILITOT 0.3 06/06/2020    Lab Results  Component Value Date   TSH 1.338 02/26/2020     Assessment:   Well woman exam with routine gynecological exam  Family history of breast cancer in mother Positive test for genetic marker of susceptibility to malignant neoplasm of breast Heavy menses PMS symptoms Migraine with aura  Plan:    - Blood tests: Labs reviewed. - Breast self exam technique reviewed and patient encouraged to perform self-exam monthly.   Mammogram ordered. Also will alternate with MRIs every 6 months. - Contraception: Phexxi vaginal inserts.  Discussed option of use of other progesterone-only methods to help manage cycles as well as PMS symptoms.   - Discussed healthy lifestyle modifications. - Pap smear performed today.  Continue routine screening. - Family h/o breast cancer (hereditary) - Patient has undergone genetic testing (positive for PALB2 gene). Had mammogram performed last year due due to breast complaints, as well as for initiation of screening due to family history and genetics.  And also alternating with  MRI every 6 months. She can also consider risk-reducing bilateral mastectomy. She has also been previously counseled regarding her risk of ovarian cancer (although no specific risk known with current gene), but may consider risk-reducing BSO based on age and other factors.  - H/o migraines, currently managed on  - Follow up in 1 year for annual exam.  [name], MD [facility]

## 2021-02-08 ENCOUNTER — Encounter: Payer: Self-pay | Admitting: Obstetrics and Gynecology

## 2021-02-08 NOTE — Patient Instructions (Signed)
Preventive Care 21-30 Years Old, Female Preventive care refers to lifestyle choices and visits with your health care provider that can promote health and wellness. This includes:  A yearly physical exam. This is also called an annual wellness visit.  Regular dental and eye exams.  Immunizations.  Screening for certain conditions.  Healthy lifestyle choices, such as: ? Eating a healthy diet. ? Getting regular exercise. ? Not using drugs or products that contain nicotine and tobacco. ? Limiting alcohol use. What can I expect for my preventive care visit? Physical exam Your health care provider may check your:  Height and weight. These may be used to calculate your BMI (body mass index). BMI is a measurement that tells if you are at a healthy weight.  Heart rate and blood pressure.  Body temperature.  Skin for abnormal spots. Counseling Your health care provider may ask you questions about your:  Past medical problems.  Family's medical history.  Alcohol, tobacco, and drug use.  Emotional well-being.  Home life and relationship well-being.  Sexual activity.  Diet, exercise, and sleep habits.  Work and work environment.  Access to firearms.  Method of birth control.  Menstrual cycle.  Pregnancy history. What immunizations do I need? Vaccines are usually given at various ages, according to a schedule. Your health care provider will recommend vaccines for you based on your age, medical history, and lifestyle or other factors, such as travel or where you work.   What tests do I need? Blood tests  Lipid and cholesterol levels. These may be checked every 5 years starting at age 20.  Hepatitis C test.  Hepatitis B test. Screening  Diabetes screening. This is done by checking your blood sugar (glucose) after you have not eaten for a while (fasting).  STD (sexually transmitted disease) testing, if you are at risk.  BRCA-related cancer screening. This may be  done if you have a family history of breast, ovarian, tubal, or peritoneal cancers.  Pelvic exam and Pap test. This may be done every 3 years starting at age 21. Starting at age 30, this may be done every 5 years if you have a Pap test in combination with an HPV test. Talk with your health care provider about your test results, treatment options, and if necessary, the need for more tests.   Follow these instructions at home: Eating and drinking  Eat a healthy diet that includes fresh fruits and vegetables, whole grains, lean protein, and low-fat dairy products.  Take vitamin and mineral supplements as recommended by your health care provider.  Do not drink alcohol if: ? Your health care provider tells you not to drink. ? You are pregnant, may be pregnant, or are planning to become pregnant.  If you drink alcohol: ? Limit how much you have to 0-1 drink a day. ? Be aware of how much alcohol is in your drink. In the [place] one drink equals one 12 oz bottle of beer (355 mL), one 5 oz glass of wine (148 mL), or one 1 oz glass of hard liquor (44 mL).   Lifestyle  Take daily care of your teeth and gums. Brush your teeth every morning and night with fluoride toothpaste. Floss one time each day.  Stay active. Exercise for at least 30 minutes 5 or more days each week.  Do not use any products that contain nicotine or tobacco, such as cigarettes, e-cigarettes, and chewing tobacco. If you need help quitting, ask your health care provider.  Do not   use drugs.  If you are sexually active, practice safe sex. Use a condom or other form of protection to prevent STIs (sexually transmitted infections).  If you do not wish to become pregnant, use a form of birth control. If you plan to become pregnant, see your health care provider for a prepregnancy visit.  Find healthy ways to cope with stress, such as: ? Meditation, yoga, or listening to music. ? Journaling. ? Talking to a trusted  person. ? Spending time with friends and family. Safety  Always wear your seat belt while driving or riding in a vehicle.  Do not drive: ? If you have been drinking alcohol. Do not ride with someone who has been drinking. ? When you are tired or distracted. ? While texting.  Wear a helmet and other protective equipment during sports activities.  If you have firearms in your house, make sure you follow all gun safety procedures.  Seek help if you have been physically or sexually abused. What's next?  Go to your health care provider once a year for an annual wellness visit.  Ask your health care provider how often you should have your eyes and teeth checked.  Stay up to date on all vaccines. This information is not intended to replace advice given to you by your health care provider. Make sure you discuss any questions you have with your health care provider. Document Revised: 06/23/2020 Document Reviewed: 07/07/2018 Elsevier Patient Education  2021 Elsevier Inc.     Breast Self-Awareness Breast self-awareness means being familiar with how your breasts look and feel. It involves checking your breasts regularly and reporting any changes to your health care provider. Practicing breast self-awareness is important. Sometimes changes may not be harmful (are benign), but sometimes a change in your breasts can be a sign of a serious medical problem. It is important to learn how to do this procedure correctly so that you can catch problems early, when treatment is more likely to be successful. All women should practice breast self-awareness, including women who have had breast implants. What you need:  A mirror.  A well-lit room. How to do a breast self-exam A breast self-exam is one way to learn what is normal for your breasts and whether your breasts are changing. To do a breast self-exam: Look for changes 1. Remove all the clothing above your waist. 2. Stand in front of a mirror  in a room with good lighting. 3. Put your hands on your hips. 4. Push your hands firmly downward. 5. Compare your breasts in the mirror. Look for differences between them (asymmetry), such as: ? Differences in shape. ? Differences in size. ? Puckers, dips, and bumps in one breast and not the other. 6. Look at each breast for changes in the skin, such as: ? Redness. ? Scaly areas. 7. Look for changes in your nipples, such as: ? Discharge. ? Bleeding. ? Dimpling. ? Redness. ? A change in position.   Feel for changes Carefully feel your breasts for lumps and changes. It is best to do this while lying on your back on the floor, and again while sitting or standing in the tub or shower with soapy water on your skin. Feel each breast in the following way: 1. Place the arm on the side of the breast you are examining above your head. 2. Feel your breast with the other hand. 3. Start in the nipple area and make -inch (2 cm) overlapping circles to feel your breast. Use   pads of your three middle fingers to do this. Apply light pressure, then medium pressure, then firm pressure. The light pressure will allow you to feel the tissue closest to the skin. The medium pressure will allow you to feel the tissue that is a little deeper. The firm pressure will allow you to feel the tissue close to the ribs. 4. Continue the overlapping circles, moving downward over the breast until you feel your ribs below your breast. 5. Move one finger-width toward the center of the body. Continue to use the -inch (2 cm) overlapping circles to feel your breast as you move slowly up toward your collarbone. 6. Continue the up-and-down exam using all three pressures until you reach your armpit.   Write down what you find Writing down what you find can help you remember what to discuss with your health care provider. Write down:  What is normal for each breast.  Any changes that you find in each breast, including: ? The kind  of changes you find. ? Any pain or tenderness. ? Size and location of any lumps.  Where you are in your menstrual cycle, if you are still menstruating. General tips and recommendations  Examine your breasts every month.  If you are breastfeeding, the best time to examine your breasts is after a feeding or after using a breast pump.  If you menstruate, the best time to examine your breasts is 5-7 days after your period. Breasts are generally lumpier during menstrual periods, and it may be more difficult to notice changes.  With time and practice, you will become more familiar with the variations in your breasts and more comfortable with the exam. Contact a health care provider if you:  See a change in the shape or size of your breasts or nipples.  See a change in the skin of your breast or nipples, such as a reddened or scaly area.  Have unusual discharge from your nipples.  Find a lump or thick area that was not there before.  Have pain in your breasts.  Have any concerns related to your breast health. Summary  Breast self-awareness includes looking for physical changes in your breasts, as well as feeling for any changes within your breasts.  Breast self-awareness should be performed in front of a mirror in a well-lit room.  You should examine your breasts every month. If you menstruate, the best time to examine your breasts is 5-7 days after your menstrual period.  Let your health care provider know of any changes you notice in your breasts, including changes in size, changes on the skin, pain or tenderness, or unusual fluid from your nipples. This information is not intended to replace advice given to you by your health care provider. Make sure you discuss any questions you have with your health care provider. Document Revised: 06/14/2018 Document Reviewed: 06/14/2018 Elsevier Patient Education  [place].

## 2021-02-10 LAB — CYTOLOGY - PAP: Diagnosis: NEGATIVE

## 2021-02-11 ENCOUNTER — Encounter: Payer: Self-pay | Admitting: Adult Health

## 2021-02-12 ENCOUNTER — Ambulatory Visit (INDEPENDENT_AMBULATORY_CARE_PROVIDER_SITE_OTHER): Payer: No Typology Code available for payment source | Admitting: Vascular Surgery

## 2021-02-12 ENCOUNTER — Encounter (INDEPENDENT_AMBULATORY_CARE_PROVIDER_SITE_OTHER): Payer: Self-pay | Admitting: Vascular Surgery

## 2021-02-12 ENCOUNTER — Other Ambulatory Visit: Payer: Self-pay

## 2021-02-12 VITALS — BP 106/75 | HR 93 | Resp 16 | Wt 160.6 lb

## 2021-02-12 DIAGNOSIS — I83813 Varicose veins of bilateral lower extremities with pain: Secondary | ICD-10-CM

## 2021-02-12 NOTE — Progress Notes (Signed)
Varicose veins of bilateral  lower extremity with inflammation (454.1  I83.10) Current Plans   Indication: Patient presents with symptomatic varicose veins of the bilateral  lower extremity.   Procedure: Sclerotherapy using hypertonic saline mixed with 1% Lidocaine was performed on the bilateral lower extremity. Compression wraps were placed. The patient tolerated the procedure well. 

## 2021-02-20 ENCOUNTER — Other Ambulatory Visit: Payer: Self-pay

## 2021-02-20 MED ORDER — NORTRIPTYLINE HCL 10 MG PO CAPS: 10.0000 mg | ORAL_CAPSULE | Freq: Every day | ORAL | 3 refills | Status: DC

## 2021-02-20 NOTE — Telephone Encounter (Signed)
Yes, please refill nortriptyline 10mg  at bedtime.  She may discontinue the Aimovig

## 2021-02-24 NOTE — Progress Notes (Signed)
Established patient visit   Patient: Isabel [name]   DOB: 03/24/1991   30 y.o. Female  MRN: [id number] Visit Date: 02/25/2021  Today's healthcare provider: Marcille Buffy, FNP   Chief Complaint  Patient presents with  . Anxiety   Subjective    HPI  Anxiety, Follow-up  She was last seen for anxiety 9 months ago. Changes made at last visit include started patient on Wellbutrin 14m.   She reports excellent compliance with treatment. She reports excellent tolerance of treatment. She is not having side effects.   She feels her anxiety is mild and Improved since last visit.  Symptoms: No chest pain No difficulty concentrating  No dizziness No fatigue  No feelings of losing control No insomnia  No irritable No palpitations  Yes panic attacks No racing thoughts  No shortness of breath No sweating  No tremors/shakes     Nasal congestion, sinus pressure over 10 days.   Seeing neurology for migraines, treatment is working well.  GAD-7 Results GAD-7 Generalized Anxiety Disorder Screening Tool 02/06/2021 06/06/2020 04/17/2020  1. Feeling Nervous, Anxious, or on Edge '1 1 2  ' 2. Not Being Able to Stop or Control Worrying '1 2 3  ' 3. Worrying Too Much About Different Things '1 3 1  ' 4. Trouble Relaxing '1 3 3  ' 5. Being So Restless it's Hard To Sit Still 1 0 2  6. Becoming Easily Annoyed or Irritable 0 1 0  7. Feeling Afraid As If Something Awful Might Happen 1 1 0  Total GAD-7 Score '6 11 11  ' Difficulty At Work, Home, or Getting  Along With Others? Not difficult at all - Very difficult    PHQ-9 Scores PHQ9 SCORE ONLY 02/06/2021 06/06/2020 04/17/2020  PHQ-9 Total Score '3 10 3    ' ---------------------------------------------------------------------------------------------------  Patient Active Problem List   Diagnosis Date Noted  . Body mass index 27.0-27.9, adult 02/25/2021  . Acute non-recurrent pansinusitis 02/25/2021  . Chronic venous insufficiency 07/03/2020   . Acute otitis externa of right ear 06/06/2020  . Moderate depressive disorder ([facility] 06/06/2020  . Sorethroat 04/15/2020  . Seasonal allergies 04/11/2020  . Recurrent acute serous otitis media of right ear 04/11/2020  . Right ear pain 04/11/2020  . 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 Anxiety 03/14/2020  . RUQ pain 03/14/2020  . Periumbilical abdominal pain 03/14/2020  . History of laparoscopic cholecystectomy 03/14/2020  . Right lower quadrant abdominal pain 03/14/2020  . Corpus luteum cyst of right ovary 03/14/2020  . Umbilical hernia without obstruction or gangrene 03/14/2020  . Genetic testing   . Monoallelic mutation of PALB2 gene   . Family history of breast cancer in mother   . 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/24/2016  . Tobacco user 02/26/2016  . Irregular periods/menstrual cycles 02/26/2016  . Neck pain 02/22/2012  . Epigastric abdominal pain 09/25/2008  . LOW BACK PAIN 08/28/2008  . VASOVAGAL SYNCOPE 08/28/2008   Past Medical History:  Diagnosis Date  . Aberrant right subclavian artery   . Anxiety   . Dysmenorrhea   . Family history of breast cancer in mother   . Genetic testing 04/25/18   PALB2 analysis @ [facility] - Familial pathogenic PALB2 mutation detected  . Migraines   . 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 Amoxicillin Hives  . Penicillins Hives       Medications: Outpatient Medications Prior  to Visit  Medication Sig  . gabapentin (NEURONTIN) 100 MG capsule TAKE 2 CAPSULES (200 MG TOTAL) BY MOUTH 2 (TWO) TIMES DAILY.  . nortriptyline (PAMELOR) 10 MG capsule Take 1 capsule (10 mg total) by mouth at bedtime.  . rizatriptan (MAXALT) 10 MG tablet Take 1 tablet earliest onset of migraine.  May repeat in 2 hours if needed.  Maximum 2 tablets in 24 hours  . [DISCONTINUED] buPROPion (WELLBUTRIN SR) 150 MG 12 hr tablet TAKE 1  TABLET BY MOUTH EVERY DAY  . Erenumab-aooe (AIMOVIG) 140 MG/ML SOAJ Inject 140 mg into the skin every 28 (twenty-eight) days. (Patient not taking: No sig reported)   No facility-administered medications prior to visit.    Review of Systems  Constitutional: Negative.   HENT: Positive for congestion, sinus pressure and sinus pain.   Respiratory: Negative.   Cardiovascular: Negative.   Gastrointestinal: Negative.   Genitourinary: Negative.   Musculoskeletal: Negative.   Hematological: Negative.   Psychiatric/Behavioral: Negative.        Much improved, denies any suicidal thoughts or homicidal.        Objective    BP 117/71   Pulse (!) 109   Resp 15   Wt 162 lb 12.8 oz (73.8 kg)   LMP 02/02/2021   SpO2 100%   BMI 27.94 kg/m  BP Readings from Last 3 Encounters:  02/25/21 117/71  02/12/21 106/75  02/06/21 126/86   Wt Readings from Last 3 Encounters:  02/25/21 162 lb 12.8 oz (73.8 kg)  02/12/21 160 lb 9.6 oz (72.8 kg)  02/06/21 161 lb 12.8 oz (73.4 kg)       Physical Exam Constitutional:      General: She is not in acute distress.    Appearance: Normal appearance. She is normal weight. She is not ill-appearing or diaphoretic.  HENT:     Head: Normocephalic.     Right Ear: Tympanic membrane, ear canal and external ear normal.     Left Ear: Tympanic membrane, ear canal and external ear normal.     Nose: Congestion present.     Mouth/Throat:     Mouth: Mucous membranes are moist.  Eyes:     Conjunctiva/sclera: Conjunctivae normal.     Pupils: Pupils are equal, round, and reactive to light.  Cardiovascular:     Rate and Rhythm: Normal rate and regular rhythm.     Pulses: Normal pulses.     Heart sounds: Normal heart sounds.  Pulmonary:     Effort: Pulmonary effort is normal. No respiratory distress.     Breath sounds: Normal breath sounds. No stridor. No wheezing, rhonchi or rales.  Chest:     Chest wall: No tenderness.  Abdominal:     Palpations: Abdomen is  soft.  Musculoskeletal:        General: Normal range of motion.     Cervical back: Normal range of motion. No tenderness.  Lymphadenopathy:     Cervical: No cervical adenopathy.  Skin:    General: Skin is warm.     Findings: No erythema or rash.  Neurological:     General: No focal deficit present.     Mental Status: She is alert.  Psychiatric:        Mood and Affect: Mood normal.        Behavior: Behavior normal.        Thought Content: Thought content normal.        Judgment: Judgment normal.      No results found  for any visits on 02/25/21.  Assessment & Plan    Body mass index 27.0-27.9, adult  Recurrent acute serous otitis media of right ear - Plan: buPROPion (WELLBUTRIN SR) 150 MG 12 hr tablet  Anxiety - Plan: buPROPion (WELLBUTRIN SR) 150 MG 12 hr tablet  Acute non-recurrent pansinusitis  Meds ordered this encounter  Medications  . buPROPion (WELLBUTRIN SR) 150 MG 12 hr tablet    Sig: Take 1 tablet (150 mg total) by mouth daily.    Dispense:  90 tablet    Refill:  1  . azithromycin (ZITHROMAX) 250 MG tablet    Sig: By mouth Take 2 tablets day 1 (539m total) and 1 tablet ( 250 mg ) on days 2,3,4,5.    Dispense:  6 each    Refill:  0   Red Flags discussed. The patient was given clear instructions to go to ER or return to medical center if any red flags develop, symptoms do not improve, worsen or new problems develop. They verbalized understanding.   CPE at next visit after May 2022.  Return in about 6 months (around 08/27/2021), or if symptoms worsen or fail to improve, for at any time for any worsening symptoms, Go to Emergency room/ urgent care if worse.   Red Flags discussed. The patient was given clear instructions to go to ER or return to medical center if any red flags develop, symptoms do not improve, worsen or new problems develop. They verbalized understanding.  The entirety of the information documented in the History of Present Illness, Review of  Systems and Physical Exam were personally obtained by me. Portions of this information were initially documented by the CMA and reviewed by me for thoroughness and accuracy.     MMarcille Buffy FNorth [phone](phone) [phone](fax)  [place]

## 2021-02-25 ENCOUNTER — Ambulatory Visit (INDEPENDENT_AMBULATORY_CARE_PROVIDER_SITE_OTHER): Payer: PRIVATE HEALTH INSURANCE | Admitting: Adult Health

## 2021-02-25 ENCOUNTER — Encounter: Payer: Self-pay | Admitting: Adult Health

## 2021-02-25 ENCOUNTER — Other Ambulatory Visit: Payer: Self-pay

## 2021-02-25 VITALS — BP 117/71 | HR 109 | Resp 15 | Wt 162.8 lb

## 2021-02-25 DIAGNOSIS — Z6827 Body mass index (BMI) 27.0-27.9, adult: Secondary | ICD-10-CM | POA: Insufficient documentation

## 2021-02-25 DIAGNOSIS — H6504 Acute serous otitis media, recurrent, right ear: Secondary | ICD-10-CM

## 2021-02-25 DIAGNOSIS — F419 Anxiety disorder, unspecified: Secondary | ICD-10-CM

## 2021-02-25 DIAGNOSIS — J0140 Acute pansinusitis, unspecified: Secondary | ICD-10-CM | POA: Insufficient documentation

## 2021-02-25 MED ORDER — AZITHROMYCIN 250 MG PO TABS: ORAL_TABLET | ORAL | 0 refills | Status: DC

## 2021-02-25 MED ORDER — BUPROPION HCL ER (SR) 150 MG PO TB12: 150.0000 mg | ORAL_TABLET | Freq: Every day | ORAL | 1 refills | Status: DC

## 2021-02-25 NOTE — Patient Instructions (Signed)
[email or web address]">  Generalized Anxiety Disorder, Adult Generalized anxiety disorder (GAD) is a mental health condition. Unlike normal worries, anxiety related to GAD is not triggered by a specific event. These worries do not fade or get better with time. GAD interferes with relationships, work, and school. GAD symptoms can vary from mild to severe. People with severe GAD can have intense waves of anxiety with physical symptoms that are similar to panic attacks. What are the causes? The exact cause of GAD is not known, but the following are believed to have an impact:  Differences in natural brain chemicals.  Genes passed down from parents to children.  Differences in the way threats are perceived.  Development during childhood.  Personality. What increases the risk? The following factors may make you more likely to develop this condition:  Being female.  Having a family history of anxiety disorders.  Being very shy.  Experiencing very stressful life events, such as the death of a loved one.  Having a very stressful family environment. What are the signs or symptoms? People with GAD often worry excessively about many things in their lives, such as their health and family. Symptoms may also include:  Mental and emotional symptoms: ? Worrying excessively about natural disasters. ? Fear of being late. ? Difficulty concentrating. ? Fears that others are judging your performance.  Physical symptoms: ? Fatigue. ? Headaches, muscle tension, muscle twitches, trembling, or feeling shaky. ? Feeling like your heart is pounding or beating very fast. ? Feeling out of breath or like you cannot take a deep breath. ? Having trouble falling asleep or staying asleep, or experiencing restlessness. ? Sweating. ? Nausea, diarrhea, or irritable bowel syndrome (IBS).  Behavioral symptoms: ? Experiencing erratic moods or irritability. ? Avoidance of new situations. ? Avoidance of  people. ? Extreme difficulty making decisions. How is this diagnosed? This condition is diagnosed based on your symptoms and medical history. You will also have a physical exam. Your health care provider may perform tests to rule out other possible causes of your symptoms. To be diagnosed with GAD, a person must have anxiety that:  Is out of his or her control.  Affects several different aspects of his or her life, such as work and relationships.  Causes distress that makes him or her unable to take part in normal activities.  Includes at least three symptoms of GAD, such as restlessness, fatigue, trouble concentrating, irritability, muscle tension, or sleep problems. Before your health care provider can confirm a diagnosis of GAD, these symptoms must be present more days than they are not, and they must last for 6 months or longer. How is this treated? This condition may be treated with:  Medicine. Antidepressant medicine is usually prescribed for long-term daily control. Anti-anxiety medicines may be added in severe cases, especially when panic attacks occur.  Talk therapy (psychotherapy). Certain types of talk therapy can be helpful in treating GAD by providing support, education, and guidance. Options include: ? Cognitive behavioral therapy (CBT). People learn coping skills and self-calming techniques to ease their physical symptoms. They learn to identify unrealistic thoughts and behaviors and to replace them with more appropriate thoughts and behaviors. ? Acceptance and commitment therapy (ACT). This treatment teaches people how to be mindful as a way to cope with unwanted thoughts and feelings. ? Biofeedback. This process trains you to manage your body's response (physiological response) through breathing techniques and relaxation methods. You will work with a therapist while machines are used to monitor your physical   symptoms.  Stress management techniques. These include yoga,  meditation, and exercise. A mental health specialist can help determine which treatment is best for you. Some people see improvement with one type of therapy. However, other people require a combination of therapies.   Follow these instructions at home: Lifestyle  Maintain a consistent routine and schedule.  Anticipate stressful situations. Create a plan, and allow extra time to work with your plan.  Practice stress management or self-calming techniques that you have learned from your therapist or your health care provider. General instructions  Take over-the-counter and prescription medicines only as told by your health care provider.  Understand that you are likely to have setbacks. Accept this and be kind to yourself as you persist to take better care of yourself.  Recognize and accept your accomplishments, even if you judge them as small.  Keep all follow-up visits as told by your health care provider. This is important. Contact a health care provider if:  Your symptoms do not get better.  Your symptoms get worse.  You have signs of depression, such as: ? A persistently sad or irritable mood. ? Loss of enjoyment in activities that used to bring you joy. ? Change in weight or eating. ? Changes in sleeping habits. ? Avoiding friends or family members. ? Loss of energy for normal tasks. ? Feelings of guilt or worthlessness. Get help right away if:  You have serious thoughts about hurting yourself or others. If you ever feel like you may hurt yourself or others, or have thoughts about taking your own life, get help right away. Go to your nearest emergency department or:  Call your local emergency services (911 in the [place]  Call a suicide crisis helpline, such as the National Suicide Prevention Lifeline at 1-800-273-8255. This is open 24 hours a day in the [place]  Text the Crisis Text Line at 741741 (in the [place] Summary  Generalized anxiety disorder (GAD) is a mental  health condition that involves worry that is not triggered by a specific event.  People with GAD often worry excessively about many things in their lives, such as their health and family.  GAD may cause symptoms such as restlessness, trouble concentrating, sleep problems, frequent sweating, nausea, diarrhea, headaches, and trembling or muscle twitching.  A mental health specialist can help determine which treatment is best for you. Some people see improvement with one type of therapy. However, other people require a combination of therapies. This information is not intended to replace advice given to you by your health care provider. Make sure you discuss any questions you have with your health care provider. Document Revised: 08/16/2019 Document Reviewed: 08/16/2019 Elsevier Patient Education  2021 Elsevier Inc.  

## 2021-03-12 ENCOUNTER — Ambulatory Visit (INDEPENDENT_AMBULATORY_CARE_PROVIDER_SITE_OTHER): Payer: No Typology Code available for payment source | Admitting: Vascular Surgery

## 2021-03-12 ENCOUNTER — Other Ambulatory Visit: Payer: Self-pay

## 2021-03-12 ENCOUNTER — Encounter (INDEPENDENT_AMBULATORY_CARE_PROVIDER_SITE_OTHER): Payer: Self-pay | Admitting: Vascular Surgery

## 2021-03-12 VITALS — BP 106/70 | HR 94 | Ht 64.0 in | Wt 166.0 lb

## 2021-03-12 DIAGNOSIS — I83813 Varicose veins of bilateral lower extremities with pain: Secondary | ICD-10-CM

## 2021-03-12 DIAGNOSIS — I872 Venous insufficiency (chronic) (peripheral): Secondary | ICD-10-CM | POA: Diagnosis not present

## 2021-03-12 NOTE — Progress Notes (Signed)
Varicose veins of bilateral  lower extremity with inflammation (454.1  I83.10) Current Plans   Indication: Patient presents with symptomatic varicose veins of the bilateral  lower extremity.   Procedure: Sclerotherapy using hypertonic saline mixed with 1% Lidocaine was performed on the bilateral lower extremity. Compression wraps were placed. The patient tolerated the procedure well. 

## 2021-04-08 ENCOUNTER — Encounter: Payer: PRIVATE HEALTH INSURANCE | Admitting: Obstetrics and Gynecology

## 2021-04-08 ENCOUNTER — Other Ambulatory Visit: Payer: Self-pay

## 2021-05-03 ENCOUNTER — Other Ambulatory Visit: Payer: Self-pay | Admitting: Adult Health

## 2021-05-03 ENCOUNTER — Other Ambulatory Visit: Payer: Self-pay | Admitting: Neurology

## 2021-05-20 NOTE — Progress Notes (Signed)
NEUROLOGY FOLLOW UP OFFICE NOTE  Isabel [name] [id number]  Assessment/Plan:   Chronic migraine with and without aura  Migraine prevention:  Refilled Aimovig 128m Q28d [place] [facility] [place]  If she has trouble getting it filled, she is to contact me.  I gave her a sample to get started. Migraine rescue:  rizatriptan 157mLimit use of pain relievers to no more than 2 days out of week to prevent risk of rebound or medication-overuse headache. Keep headache diary Follow up 6 months  Subjective:  Isabel [name] a [age]ear old right-handed female who follows up for migraines.   UPDATE: Prescribed Aimovig - it was approved but it was [place] pharmacy [place] of [place].   Intensity:  Mild, severe Duration:  Mild last 30 minutes, severe last 6 hours Frequency:  15 headache days (12 are mild and can function, 3 are severe and cannot function) Frequency of abortive medication: 1 to 2 days a month Current NSAIDS/analgesics:  Children's tylenol Current triptans:  rizatriptan 1074murrent ergotamine:  none Current anti-emetic:  none Current muscle relaxants:  none Current Antihypertensive medications:  none Current Antidepressant medications:  Nortriptyline 40m34m bedtime, Wellbutrin Current Anticonvulsant medications:  gabapentin 200mg40m (sciatica) Current anti-CGRP:  none Current Vitamins/Herbal/Supplements:  none Current Antihistamines/Decongestants:  none Other therapy:  none   HISTORY:  She has remote history of migraines several years ago but then resolved.  She started getting new headaches in 2021.  She describes a diffuse severe throbbing headache with pins and needles sensation around her right eye with severe right eye pressure, that sometimes radiates to back of her neck.  It occurs daily but fluctuates.  Sometimes she sees spots in both eyes.  She sometimes has nausea but no nausea.  Photophobia but no photophobia.   Sometimes she gets a  paroxysmal stabbing pain in the mild-occipital region, occurring every now and then.  She started feeling numbness and tingling in the arms.  MRI of cervical spine from 03/17/2020 was unremarkable.  However, it did show evidence of empty sella.  MRA of neck was negative. She subsequently saw a chiropractor and neck pain and arm numbness has improved.  She was treated for recurrent ear infections.  She subsequently saw ENT who told her she did not have ear infection but that she should see neurology due to the empty sella finding on cervical MRI.  She denies visual obscurations and pulsatile tinnitus.  She does have high-frequency tinnitus.  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  She does endorse mild transient tinnitus   Referred to ophthalmology.  She saw Dr. WeaveKathlen Modyovember.  Exhibited dry eyes but no papilledema.     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 6 each 0   buPROPion (WELLBUTRIN SR) 150 MG 12 hr tablet Take 1 tablet (150 mg total) by mouth daily. 90 tablet 1   gabapentin (NEURONTIN) 100 MG capsule TAKE 2 CAPSULES BY MOUTH 2 TIMES DAILY. 360 capsule 0   nortriptyline (PAMELOR) 10 MG  capsule TAKE 1 CAPSULE BY MOUTH EVERYDAY AT BEDTIME 30 capsule 0   rizatriptan (MAXALT) 10 MG tablet Take 1 tablet earliest onset of migraine.  May repeat in 2 hours if needed.  Maximum 2 tablets in 24 hours 10 tablet 5   [DISCONTINUED] famotidine (PEPCID) 20 MG tablet Take 1 tablet (20 mg total) by mouth 2 (two) times daily. 60 tablet 0   No current facility-administered medications on file prior to visit.    ALLERGIES: Allergies  Allergen Reactions   Amoxicillin Hives   Penicillins Hives    FAMILY HISTORY: Family History  Problem Relation Age of Onset   Breast cancer Mother 347      currently 433  Hypertension Father    Arthritis Other    Diabetes Other    Cancer Other    Diabetes Maternal Grandmother    Diabetes Maternal Grandfather    Diabetes Paternal Grandmother    Diabetes Paternal Grandfather    Breast cancer Maternal Aunt 545      currently 559 PALB2 mutation   Colon cancer Neg Hx    Heart disease Neg Hx       Objective:  Blood pressure 108/78, pulse (!) 101, height '5\' 4"'  (1.626 m), weight 168 lb 8 oz (76.4 kg). General: No acute distress.  Patient appears well-groomed.   Head:  Normocephalic/atraumatic Eyes:  Fundi examined but not visualized Neck: supple, no paraspinal tenderness, full range of motion Heart:  Regular rate and rhythm Lungs:  Clear to auscultation bilaterally Back: No paraspinal tenderness Neurological Exam: alert and oriented to person, place, and time.  Speech fluent and not dysarthric, language intact.  Altered sensation in right V1 distribution.  Otherwise, CN II-XII intact. Bulk and tone normal, muscle strength 5/5 throughout.  Sensation to light touch intact.  Deep tendon reflexes 2+ throughout, toes downgoing.  Finger to nose testing intact.  Gait normal, Romberg negative.   AMetta Clines DO  CC: MLaverna Peace FNP

## 2021-05-22 ENCOUNTER — Ambulatory Visit (INDEPENDENT_AMBULATORY_CARE_PROVIDER_SITE_OTHER): Payer: PRIVATE HEALTH INSURANCE | Admitting: Neurology

## 2021-05-22 ENCOUNTER — Other Ambulatory Visit: Payer: Self-pay

## 2021-05-22 ENCOUNTER — Encounter: Payer: Self-pay | Admitting: Neurology

## 2021-05-22 VITALS — BP 108/78 | HR 101 | Ht 64.0 in | Wt 168.5 lb

## 2021-05-22 DIAGNOSIS — G43709 Chronic migraine without aura, not intractable, without status migrainosus: Secondary | ICD-10-CM | POA: Diagnosis not present

## 2021-05-22 MED ORDER — AIMOVIG 140 MG/ML ~~LOC~~ SOAJ: 140.0000 mg | SUBCUTANEOUS | 5 refills | Status: DC

## 2021-05-22 NOTE — Patient Instructions (Signed)
  Start Aimovig 140mg  every 28 days.  Contact us in 3 months. If doing well, will stop nortriptyline Take rizatriptan 10mg  at earliest onset of headache.  May repeat dose once in 2 hours if needed.  Maximum 2 tablets in 24 hours. Limit use of pain relievers to no more than 2 days out of the week.  These medications include acetaminophen, NSAIDs (ibuprofen/Advil/Motrin, naproxen/Aleve, triptans (Imitrex/sumatriptan), Excedrin, and narcotics.  This will help reduce risk of rebound headaches. Be aware of common food triggers:  - Caffeine:  coffee, black tea, cola, Mt. Dew  - Chocolate  - Dairy:  aged cheeses (brie, blue, cheddar, gouda, [place], provolone, [place], Swiss, etc), chocolate milk, buttermilk, sour cream, limit eggs and yogurt  - Nuts, peanut butter  - Alcohol  - Cereals/grains:  FRESH breads (fresh bagels, sourdough, doughnuts), yeast productions  - Processed/canned/aged/cured meats (pre-packaged deli meats, hotdogs)  - MSG/glutamate:  soy sauce, flavor enhancer, pickled/preserved/marinated foods  - Sweeteners:  aspartame (Equal, Nutrasweet).  Sugar and Splenda are okay  - Vegetables:  legumes (lima beans, lentils, snow peas, fava beans, pinto peans, peas, garbanzo beans), sauerkraut, onions, olives, pickles  - Fruit:  avocados, bananas, citrus fruit (orange, lemon, grapefruit), mango  - Other:  Frozen meals, macaroni and cheese Routine exercise Stay adequately hydrated (aim for 64 oz water daily) Keep headache diary Maintain proper stress management Maintain proper sleep hygiene Do not skip meals Consider supplements:  magnesium citrate 400mg  daily, riboflavin 400mg  daily, coenzyme Q10 100mg  three times daily.

## 2021-05-22 NOTE — Progress Notes (Signed)
Medication Samples have been provided to the patient.  Drug name: aimovig       Strength: 140mg         Qty:  1  LOT: [id number]  Exp.Date: 04/08/22  Dosing instructions: one injection every 28 days   The patient has been instructed regarding the correct time, dose, and frequency of taking this medication, including desired effects and most common side effects.    05/22/2021

## 2021-07-10 ENCOUNTER — Other Ambulatory Visit: Payer: Self-pay

## 2021-07-10 ENCOUNTER — Ambulatory Visit (INDEPENDENT_AMBULATORY_CARE_PROVIDER_SITE_OTHER): Payer: PRIVATE HEALTH INSURANCE | Admitting: Family Medicine

## 2021-07-10 ENCOUNTER — Encounter: Payer: Self-pay | Admitting: Family Medicine

## 2021-07-10 VITALS — BP 112/75 | HR 70 | Temp 98.6°F | Wt 170.5 lb

## 2021-07-10 DIAGNOSIS — S29012A Strain of muscle and tendon of back wall of thorax, initial encounter: Secondary | ICD-10-CM | POA: Insufficient documentation

## 2021-07-10 NOTE — Assessment & Plan Note (Addendum)
Discussed muscle strain. OK for prn ibuprofen or voltaren gel. Home stretches discussed and handout provided. Return or PT if not improving.

## 2021-07-10 NOTE — Progress Notes (Signed)
Subjective:     Isabel [name] is a 30 y.o. female presenting for Back Pain (L thoracic pain x 1 week)     Back Pain This is a new problem. The current episode started in the past 7 days. The problem occurs daily. The problem is unchanged. The pain is present in the thoracic spine. The quality of the pain is described as burning and aching (twisting). The pain does not radiate. The pain is moderate. Pertinent negatives include no bladder incontinence, bowel incontinence, fever, numbness, perianal numbness, tingling or weakness. She has tried heat (bending) for the symptoms. The treatment provided mild relief.   No nerve pain Is on gabapentin and nortriptyline   Review of Systems  Constitutional:  Negative for fever.  Gastrointestinal:  Negative for bowel incontinence.  Genitourinary:  Negative for bladder incontinence.  Musculoskeletal:  Positive for back pain.  Neurological:  Negative for tingling, weakness and numbness.    Social History   Tobacco Use  Smoking Status Every Day   Packs/day: 0.50   Types: Cigarettes  Smokeless Tobacco Never        Objective:    BP Readings from Last 3 Encounters:  07/10/21 112/75  05/22/21 108/78  03/12/21 106/70   Wt Readings from Last 3 Encounters:  07/10/21 170 lb 8 oz (77.3 kg)  05/22/21 168 lb 8 oz (76.4 kg)  03/12/21 166 lb (75.3 kg)    BP 112/75   Pulse 70   Temp 98.6 F (37 C) (Temporal)   Wt 170 lb 8 oz (77.3 kg)   LMP 06/21/2021 (Exact Date)   SpO2 100%   BMI 29.27 kg/m    Physical Exam Constitutional:      General: She is not in acute distress.    Appearance: She is well-developed. She is not diaphoretic.  HENT:     Right Ear: External ear normal.     Left Ear: External ear normal.  Eyes:     Conjunctiva/sclera: Conjunctivae normal.  Cardiovascular:     Rate and Rhythm: Normal rate and regular rhythm.     Heart sounds: No murmur heard. Pulmonary:     Effort: Pulmonary effort is normal. No  respiratory distress.     Breath sounds: Normal breath sounds. No wheezing.  Musculoskeletal:     Cervical back: Neck supple.     Comments: Back:  Inspection: no abnormalities Palpation: TTP along the left rhomboid and paraspinous muscles, no spinous ttp ROM: some pain with right rotation and back extension, no pain with shoulder rom. Otherwise normal rom Strength: normal upper body strength   Skin:    General: Skin is warm and dry.     Capillary Refill: Capillary refill takes less than 2 seconds.  Neurological:     Mental Status: She is alert. Mental status is at baseline.  Psychiatric:        Mood and Affect: Mood normal.        Behavior: Behavior normal.          Assessment & Plan:   Problem List Items Addressed This Visit       Musculoskeletal and Integument   Strain of rhomboid muscle - Primary    Discussed muscle strain. OK for prn ibuprofen or voltaren gel. Home stretches discussed and handout provided. Return or PT if not improving.         Return if symptoms worsen or fail to improve.  Lesleigh Noe, MD  This visit occurred during the SARS-CoV-2 public health  emergency.  Safety protocols were in place, including screening questions prior to the visit, additional usage of staff PPE, and extensive cleaning of exam room while observing appropriate contact time as indicated for disinfecting solutions.

## 2021-07-22 ENCOUNTER — Telehealth (INDEPENDENT_AMBULATORY_CARE_PROVIDER_SITE_OTHER): Payer: PRIVATE HEALTH INSURANCE | Admitting: Family Medicine

## 2021-07-22 DIAGNOSIS — U071 COVID-19: Secondary | ICD-10-CM

## 2021-07-22 MED ORDER — MOLNUPIRAVIR EUA 200MG CAPSULE: 4.0000 | ORAL_CAPSULE | Freq: Two times a day (BID) | ORAL | 0 refills | Status: AC

## 2021-07-22 MED ORDER — BENZONATATE 100 MG PO CAPS
100.0000 mg | ORAL_CAPSULE | Freq: Three times a day (TID) | ORAL | 0 refills | Status: DC | PRN
Start: 2021-07-22 — End: 2021-08-05

## 2021-07-22 NOTE — Progress Notes (Signed)
Virtual Visit via Video Note  I connected with Isabel [name]  on 07/22/21 at  1:00 PM EDT by a video enabled telemedicine application and verified that I am speaking with the correct person using two identifiers.  Location patient: home, [place] Location provider:work or home office Persons participating in the virtual visit: patient, provider  I discussed the limitations of evaluation and management by telemedicine and the availability of in person appointments. The patient expressed understanding and agreed to proceed.   HPI:  Acute telemedicine visit for Covid19: -Onset: yesterday; had a positive covid test today -Symptoms include: chills, nasal congestion, headaches, pressure in the ears, fever -Denies:CP, SOB, NVD, inability to eat/drink/get out of bed -Pertinent past medical history: see below, currently a smoker -Pertinent medication allergies:  Allergies  Allergen Reactions   Amoxicillin Hives   Penicillins Hives  -COVID-19 vaccine status: she is not vaccinated - reports is due to increased risk for blood clotting -FDLMP: just finished period 2 days ago  ROS: See pertinent positives and negatives per HPI.  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)    Past Surgical History:  Procedure Laterality Date   BREAST BIOPSY Right 07/04/2020   stereo biopsy/ ribbon clip/ path pending   CHOLECYSTECTOMY       Current Outpatient Medications:    benzonatate (TESSALON PERLES) 100 MG capsule, Take 1 capsule (100 mg total) by mouth 3 (three) times daily as needed., Disp: 20 capsule, Rfl: 0   molnupiravir EUA (LAGEVRIO) 200 mg CAPS capsule, Take 4 capsules (800 mg total) by  mouth 2 (two) times daily for 5 days., Disp: 40 capsule, Rfl: 0   buPROPion (WELLBUTRIN SR) 150 MG 12 hr tablet, Take 1 tablet (150 mg total) by mouth daily., Disp: 90 tablet, Rfl: 1   Erenumab-aooe (AIMOVIG) 140 MG/ML SOAJ, Inject 140 mg into the skin every 28 (twenty-eight) days., Disp: 1.12 mL, Rfl: 5   gabapentin (NEURONTIN) 100 MG capsule, TAKE 2 CAPSULES BY MOUTH 2 TIMES DAILY., Disp: 360 capsule, Rfl: 0   nortriptyline (PAMELOR) 10 MG capsule, TAKE 1 CAPSULE BY MOUTH EVERYDAY AT BEDTIME, Disp: 30 capsule, Rfl: 0   rizatriptan (MAXALT) 10 MG tablet, Take 1 tablet earliest onset of migraine.  May repeat in 2 hours if needed.  Maximum 2 tablets in 24 hours, Disp: 10 tablet, Rfl: 5  EXAM:  VITALS per patient if applicable:  GENERAL: alert, oriented, appears well and in no acute distress  HEENT: atraumatic, conjunttiva clear, no obvious abnormalities on inspection of external nose and ears  NECK: normal movements of the head and neck  LUNGS: on inspection no signs of respiratory distress, breathing rate appears normal, no obvious gross SOB, gasping or wheezing  CV: no obvious cyanosis  MS: moves all visible extremities without noticeable abnormality  PSYCH/NEURO: pleasant and cooperative, no obvious depression or anxiety, speech and thought processing grossly intact  ASSESSMENT AND PLAN:  Discussed the following assessment and plan:  COVID-19   Discussed treatment options (infusions and oral options and risk of drug interactions), ideal treatment window, potential complications, isolation and precautions for COVID-19.  Discussed possibility of rebound with antivirals and the need to  reisolate if it should occur for 5 days. Checked for/reviewed any labs done in the last 90 days with GFR listed in HPI if available. After lengthy discussion, the patient opted for treatment with molnupiravir due to being higher risk for complications of covid or severe disease and other factors. She is  adamant about doing treatment due to smoking hx, not vaccinated and getting married next weekend. Did discuss isolation period and risk in pregnancy. Discussed EUA status of this drug and the fact that there is preliminary limited knowledge of risks/interactions/side effects per EUA document vs possible benefits and precautions. This information was shared with patient during the visit and also was provided in patient instructions. Also, advised that patient discuss risks/interactions and use with pharmacist/treatment team as well. The patient did want a prescription for cough, Tessalon Rx sent.  Other symptomatic care measures summarized in patient instructions. Work/School slipped offered: declined  Advised to seek prompt in person care if worsening, new symptoms arise, or if is not improving with treatment. Discussed options for inperson care if PCP office not available. Did let this patient know that I only do telemedicine on Tuesdays and Thursdays for [place]. Advised to schedule follow up visit with PCP or [facility] if any further questions or concerns to avoid delays in care.   I discussed the assessment and treatment plan with the patient. The patient was provided an opportunity to ask questions and all were answered. The patient agreed with the plan and demonstrated an understanding of the instructions.     Lucretia Kern, DO

## 2021-07-22 NOTE — Patient Instructions (Addendum)
HOME CARE TIPS:  -I sent the medication(s) we discussed to your pharmacy: Meds ordered this encounter  Medications   molnupiravir EUA (LAGEVRIO) 200 mg CAPS capsule    Sig: Take 4 capsules (800 mg total) by mouth 2 (two) times daily for 5 days.    Dispense:  40 capsule    Refill:  0   benzonatate (TESSALON PERLES) 100 MG capsule    Sig: Take 1 capsule (100 mg total) by mouth 3 (three) times daily as needed.    Dispense:  20 capsule    Refill:  0     -I sent in the [place] treatment or referral you requested per our discussion. Please see the information provided below and discuss further with the pharmacist/treatment team.   ? LAGEVRIO may cause harm to an unborn baby. It is not known if LAGEVRIO will harm your baby if you take LAGEVRIO during pregnancy. o LAGEVRIO is not recommended for use in pregnancy. o LAGEVRIO has not been studied in pregnancy. LAGEVRIO was studied in pregnant animals only. When LAGEVRIO was given to pregnant animals, LAGEVRIO caused harm to their unborn babies. o You and your healthcare provider may decide that you should take LAGEVRIO during pregnancy if there are no other COVID-19 treatment options approved or authorized by the FDA that are accessible or clinically appropriate for you. o If you and your healthcare provider decide that you should take LAGEVRIO during pregnancy, you and your healthcare provider should discuss the known and potential benefits and the potential risks of taking LAGEVRIO during pregnancy. For individuals who are able to become pregnant: ? You should use a reliable method of birth control (contraception) consistently and correctly during treatment with LAGEVRIO and for 4 days after the last dose of LAGEVRIO. Talk to your healthcare provider about reliable birth control methods.  -If taking an antiviral, there is a chance of rebound illness after finishing your treatment. If you become sick again please isolate for an additional  5 days.    -can use tylenol or aleve if needed for fevers, aches and pains per instructions  -can use nasal saline a few times per day if you have nasal congestion; sometimes  a short course of Afrin nasal spray for 3 days can help with symptoms as well  -stay hydrated, drink plenty of fluids and eat small healthy meals - avoid dairy  -can take 1000 IU (18mg) Vit D3 and 100-500 mg of Vit C daily per instructions  -If the Covid test is positive, check out the [facility] for more information on home care, transmission and treatment for COVID19  -follow up with your doctor in 2-3 days unless improving and feeling better  -stay home while sick, except to seek medical care. If you have COVID19, ideally it would be best to stay home for a full 10 days since the onset of symptoms PLUS one day of no fever and feeling better. Wear a good mask that fits snugly (such as N95 or KN95) if around others to reduce the risk of transmission.  It was nice to meet you today, and I really hope you are feeling better soon. I help [place] out with telemedicine visits on Tuesdays and Thursdays and am available for visits on those days. If you have any concerns or questions following this visit please schedule a follow up visit with your Primary Care doctor or seek care at a local urgent care clinic to avoid delays in care.    Seek in person  care or schedule a follow up video visit promptly if your symptoms worsen, new concerns arise or you are not improving with treatment. Call 911 and/or seek emergency care if your symptoms are severe or life threatening.     Fact Sheet for Patients And Caregivers Emergency Use Authorization (EUA) Of LAGEVRIOT (molnupiravir) capsules For Coronavirus Disease 2019 (COVID-19)  What is the most important information I should know about LAGEVRIO? LAGEVRIO may cause serious side effects, including: ? LAGEVRIO may cause harm to your unborn baby. It is not known if LAGEVRIO  will harm your baby if you take LAGEVRIO during pregnancy. o LAGEVRIO is not recommended for use in pregnancy. o LAGEVRIO has not been studied in pregnancy. LAGEVRIO was studied in pregnant animals only. When LAGEVRIO was given to pregnant animals, LAGEVRIO caused harm to their unborn babies. o You and your healthcare provider may decide that you should take LAGEVRIO during pregnancy if there are no other COVID-19 treatment options approved or authorized by the FDA that are accessible or clinically appropriate for you. o If you and your healthcare provider decide that you should take LAGEVRIO during pregnancy, you and your healthcare provider should discuss the known and potential benefits and the potential risks of taking LAGEVRIO during pregnancy. For individuals who are able to become pregnant: ? You should use a reliable method of birth control (contraception) consistently and correctly during treatment with LAGEVRIO and for 4 days after the last dose of LAGEVRIO. Talk to your healthcare provider about reliable birth control methods. ? Before starting treatment with [facility] your healthcare provider may do a pregnancy test to see if you are pregnant before starting treatment with LAGEVRIO. ? Tell your healthcare provider right away if you become pregnant or think you may be pregnant during treatment with LAGEVRIO. Pregnancy Surveillance Program: ? There is a pregnancy surveillance program for individuals who take LAGEVRIO during pregnancy. The purpose of this program is to collect information about the health of you and your baby. Talk to your healthcare provider about how to take part in this program. ? If you take LAGEVRIO during pregnancy and you agree to participate in the pregnancy surveillance program and allow your healthcare provider to share your information with [place], then your healthcare provider will report your use of [place] during pregnancy to [place]. by calling [phone] or [email or web address]. For individuals who are sexually active with partners who are able to become pregnant: ? It is not known if LAGEVRIO can affect sperm. While the risk is regarded as low, animal studies to fully assess the potential for LAGEVRIO to affect the babies of males treated with LAGEVRIO have not been completed. A reliable method of birth control (contraception) should be used consistently and correctly during treatment with LAGEVRIO and for at least 3 months after the last dose. The risk to sperm beyond 3 months is not known. Studies to understand the risk to sperm beyond 3 months are ongoing. Talk to your healthcare provider about reliable birth control methods. Talk to your healthcare provider if you have questions or concerns about how LAGEVRIO may affect sperm. You are being given this fact sheet because your healthcare provider believes it is necessary to provide you with LAGEVRIO for the treatment of adults with mild-to-moderate coronavirus disease 2019 (COVID-19) with positive results of direct SARS-CoV-2 viral testing, and who are at high risk for progression to severe COVID-19 including hospitalization or death, and for whom other COVID-19 treatment options  approved or authorized by the FDA are not accessible or clinically appropriate. The U.S. Food and Drug Administration (FDA) has issued an Emergency Use Authorization (EUA) to make LAGEVRIO available during the COVID-19 pandemic (for more details about an EUA please see "What is an Emergency Use Authorization?" at the end of this document). LAGEVRIO is not an FDA-approved medicine in the [place]. Read this Fact Sheet for information about LAGEVRIO. Talk to your healthcare provider about your options if you have any questions. It is your choice to take LAGEVRIO.  What is COVID-19? COVID-19 is caused by a virus called a coronavirus. You can get COVID-19 through  close contact with another person who has the virus. COVID-19 illnesses have ranged from very mild-to-severe, including illness resulting in death. While information so far suggests that most COVID-19 illness is mild, serious illness can happen and may cause some of your other medical conditions to become worse. Older people and people of all ages with severe, long lasting (chronic) medical conditions like heart disease, lung disease and diabetes, for example seem to be at higher risk of being hospitalized for COVID-19.  What is LAGEVRIO? LAGEVRIO is an investigational medicine used to treat mild-to-moderate COVID-19 in adults: ? with positive results of direct SARS-CoV-2 viral testing, and ? who are at high risk for progression to severe COVID-19 including hospitalization or death, and for whom other COVID-19 treatment options approved or authorized by the FDA are not accessible or clinically appropriate. The FDA has authorized the emergency use of LAGEVRIO for the treatment of mild-tomoderate COVID-19 in adults under an EUA. For more information on EUA, see the "What is an Emergency Use Authorization (EUA)?" section at the end of this Fact Sheet. LAGEVRIO is not authorized: ? for use in people less than 69 years of age. ? for prevention of COVID-19. ? for people needing hospitalization for COVID-19. ? for use for longer than 5 consecutive days.  What should I tell my healthcare provider before I take LAGEVRIO? Tell your healthcare provider if you: ? Have any allergies ? Are breastfeeding or plan to breastfeed ? Have any serious illnesses ? Are taking any medicines (prescription, over-the-counter, vitamins, or herbal products).  How do I take LAGEVRIO? ? Take LAGEVRIO exactly as your healthcare provider tells you to take it. ? Take 4 capsules of LAGEVRIO every 12 hours (for example, at 8 am and at 8 pm) ? Take LAGEVRIO for 5 days. It is important that you complete the full 5 days  of treatment with LAGEVRIO. Do not stop taking LAGEVRIO before you complete the full 5 days of treatment, even if you feel better. ? Take LAGEVRIO with or without food. ? You should stay in isolation for as long as your healthcare provider tells you to. Talk to your healthcare provider if you are not sure about how to properly isolate while you have COVID-19. ? Swallow LAGEVRIO capsules whole. Do not open, break, or crush the capsules. If you cannot swallow capsules whole, tell your healthcare provider. ? What to do if you miss a dose: o If it has been less than 10 hours since the missed dose, take it as soon as you remember o If it has been more than 10 hours since the missed dose, skip the missed dose and take your dose at the next scheduled time. ? Do not double the dose of LAGEVRIO to make up for a missed dose.  What are the important possible side effects of LAGEVRIO? ? See, "  What is the most important information I should know about LAGEVRIO?" ? Allergic Reactions. Allergic reactions can happen in people taking LAGEVRIO, even after only 1 dose. Stop taking LAGEVRIO and call your healthcare provider right away if you get any of the following symptoms of an allergic reaction: o hives o rapid heartbeat o trouble swallowing or breathing o swelling of the mouth, lips, or face o throat tightness o hoarseness o skin rash The most common side effects of LAGEVRIO are: ? diarrhea ? nausea ? dizziness These are not all the possible side effects of LAGEVRIO. Not many people have taken LAGEVRIO. Serious and unexpected side effects may happen. This medicine is still being studied, so it is possible that all of the risks are not known at this time.  What other treatment choices are there?  Veklury (remdesivir) is FDA-approved as an intravenous (IV) infusion for the treatment of mildto-moderate [id number]-57 in certain adults and children. Talk with your doctor to see if Marijean Heath is appropriate  for you. Like LAGEVRIO, FDA may also allow for the emergency use of other medicines to treat people with COVID-19. Go to [email or web address] for more information. It is your choice to be treated or not to be treated with LAGEVRIO. Should you decide not to take it, it will not change your standard medical care.  What if I am breastfeeding? Breastfeeding is not recommended during treatment with LAGEVRIO and for 4 days after the last dose of LAGEVRIO. If you are breastfeeding or plan to breastfeed, talk to your healthcare provider about your options and specific situation before taking LAGEVRIO.  How do I report side effects with LAGEVRIO? Contact your healthcare provider if you have any side effects that bother you or do not go away. Report side effects to FDA MedWatch at SmoothHits.hu or call 1-800-FDA-1088 (1- 7175409989).  How should I store [place]? ? Store LAGEVRIO capsules at room temperature between 66F to 74F (20C to 25C). ? Keep LAGEVRIO and all medicines out of the reach of children and pets. How can I learn more about COVID-19? ? Ask your healthcare provider. ? Visit [email or web address].[email or web address] ? Contact your local or state public health department. ? Call [place] at [phone] (toll free in the [place] ? Visit [email or web address]  What Is an Emergency Use Authorization (EUA)? The [place] FDA has made [place] available under an emergency access mechanism called an Emergency Use Authorization (EUA) The EUA is supported by a Present[place], broadcasting Health and Human Service (HHS) declaration that circumstances exist to justify emergency use of drugs and biological products during the COVID-19 pandemic. LAGEVRIO for the treatment of mild-to-moderate COVID-19 in adults with positive results of direct SARS-CoV-2 viral testing, who are at high risk for  progression to severe COVID-19, including hospitalization or death, and for whom alternative COVID-19 treatment options approved or authorized by FDA are not accessible or clinically appropriate, has not undergone the same type of review as an FDA-approved product. In issuing an EUA under the PQZRA-07 public health emergency, the FDA has determined, among other things, that based on the total amount of scientific evidence available including data from adequate and well-controlled clinical trials, if available, it is reasonable to believe that the product may be effective for diagnosing, treating, or preventing COVID-19, or a serious or life-threatening disease or condition caused by COVID-19; that the known and potential benefits of the product, when used to diagnose, treat, or prevent such disease or condition, outweigh the known and potential risks of such product;  and that there are no adequate, approved, and available alternatives.  All of these criteria must be met to allow for the product to be used in the treatment of patients during the COVID-19 pandemic. The EUA for LAGEVRIO is in effect for the duration of the COVID-19 declaration justifying emergency use of LAGEVRIO, unless terminated or revoked (after which LAGEVRIO may no longer be used under the EUA). For patent information: http://rogers.info/ Copyright  2021-2022 [place], [place] and its affiliates. All rights reserved. [id number] Revised: March 2022

## 2021-07-24 ENCOUNTER — Encounter: Payer: Self-pay | Admitting: Family Medicine

## 2021-08-01 ENCOUNTER — Other Ambulatory Visit: Payer: Self-pay | Admitting: Family Medicine

## 2021-08-05 ENCOUNTER — Ambulatory Visit (INDEPENDENT_AMBULATORY_CARE_PROVIDER_SITE_OTHER): Payer: PRIVATE HEALTH INSURANCE | Admitting: Family

## 2021-08-05 ENCOUNTER — Other Ambulatory Visit: Payer: Self-pay

## 2021-08-05 ENCOUNTER — Encounter: Payer: Self-pay | Admitting: Family

## 2021-08-05 ENCOUNTER — Telehealth: Payer: Self-pay | Admitting: Adult Health

## 2021-08-05 VITALS — BP 102/74 | HR 102 | Temp 98.5°F | Ht 64.0 in | Wt 169.0 lb

## 2021-08-05 DIAGNOSIS — M5441 Lumbago with sciatica, right side: Secondary | ICD-10-CM

## 2021-08-05 DIAGNOSIS — G43909 Migraine, unspecified, not intractable, without status migrainosus: Secondary | ICD-10-CM | POA: Diagnosis not present

## 2021-08-05 DIAGNOSIS — Z803 Family history of malignant neoplasm of breast: Secondary | ICD-10-CM

## 2021-08-05 DIAGNOSIS — H6504 Acute serous otitis media, recurrent, right ear: Secondary | ICD-10-CM

## 2021-08-05 DIAGNOSIS — F419 Anxiety disorder, unspecified: Secondary | ICD-10-CM

## 2021-08-05 MED ORDER — GABAPENTIN 100 MG PO CAPS: 200.0000 mg | ORAL_CAPSULE | Freq: Two times a day (BID) | ORAL | 0 refills | Status: DC

## 2021-08-05 MED ORDER — GABAPENTIN 100 MG PO CAPS
200.0000 mg | ORAL_CAPSULE | Freq: Two times a day (BID) | ORAL | 0 refills | Status: DC
Start: 2021-08-05 — End: 2021-08-05

## 2021-08-05 MED ORDER — BUPROPION HCL ER (SR) 150 MG PO TB12: 150.0000 mg | ORAL_TABLET | Freq: Every day | ORAL | 1 refills | Status: DC

## 2021-08-05 MED ORDER — BUSPIRONE HCL 5 MG PO TABS: 5.0000 mg | ORAL_TABLET | Freq: Three times a day (TID) | ORAL | 1 refills | Status: DC

## 2021-08-05 NOTE — Telephone Encounter (Signed)
Patient returned referrals phone call. Patient no longer has Medicaid, just [employer].

## 2021-08-05 NOTE — Patient Instructions (Addendum)
   As discussed at great length, if you are considering pregnancy, please go ahead and start a prenatal vitamin and schedule a preconception counseling appointment with Dr. Marcelline Mates and Dr. Tomi Likens to discuss medications and effects in pregnancy and to baby.    Mammogram as scheduled  MRI breasts in 6 months- due March 2023. Please ensure this is scheduled.I have placed order.   Let us know if you dont hear back within a week in regards to an appointment being scheduled.   Stop wellbutrin by weaning off wellbutrin 150mg  SR every other day for one week, then stop medication.  Then you may start buspar Continue gabapentin for now as we discussed until you speak with Dr Marcelline Mates, [place].    We discussed today starting medication called Wellbutrin.  As also discussed, you must limit alcohol on this medication as alcohol and Wellbutrin together may increase your risk for seizure.  You may drink no more than 1 alcoholic beverage on this medication.  A standard drink is 12 ounces of regular beer, which is usually about 5% alcohol OR 5 ounces of wine, which is typically about 12% alcohol OR   1.5 ounces of distilled spirits, which is about 40% alcohol

## 2021-08-05 NOTE — Assessment & Plan Note (Addendum)
Chronic, unchanged over the past 5 years.  Advised baseline lumbar xray, Declines Xray today in office. Declines PT. she describes that gabapentin has been very  helpful for her, and she is very hesitant to stop this medication.  As she is not currently pregnant, she would like to continue gabapentin and she understands to stop this medication and start Tylenol if she suspects pregnancy.  She will also discuss this further with Dr. Marcelline Mates.  She understands the risk of gabapentin to baby, pregnancy are not clear.  I provided 30-day supply without refill

## 2021-08-05 NOTE — Telephone Encounter (Signed)
Lft pt vm to call ofc to verify pt ins. thanks

## 2021-08-05 NOTE — Progress Notes (Signed)
Subjective:    Patient ID: Isabel [name], female    DOB: October 04, 1991, 30 y.o.   MRN: [id number]  CC: Isabel [name] is a 30 y.o. female who presents today for follow up.   HPI: Here for medication refills, specifically Wellbutrin, gabapentin 240m bid Anxiety- wellbutrin 1527mhas been very helpful. No depression. No si/hi. No h/o anorexia, bulminia. No h/o seizure. Rare alcohol , no more than 2 drinks in a sitting.  Low back pain- Chronic for 5 years. Worse after long periods of sitting. Taking gabapentin  for shooting pain, tingling for right low back. Pain extends to right posterior leg pain and to right foot . Presentation is Unchanged. Feels well controlled  on current regimen. Started after labor for son when pressure was applied to  low back to 'turn son'. She is not too sedated on regimen.  No trouble urinating,  pain in groin, trouble urinating.  No h/o cancer.   She is considering pregnancy and not on birth control.    Dr ChMarcelline MatesB GYN  MR breast 07/2020  Due for screening mammogram Positive for breast cancer gene. Mother had breast cancer, diagnosed at age 30  Following with neurology, Dr. JaTomi Likensor chronic migraine without aura.  Last seen July of this year.  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)   Past Surgical History:  Procedure Laterality Date   BREAST BIOPSY Right 07/04/2020   stereo biopsy/ ribbon clip/ path pending   CHOLECYSTECTOMY     Family History  Problem Relation Age of Onset   Breast cancer Mother 39 69    currently 48 60 Hypertension Father    Arthritis Other    Diabetes Other    Cancer Other    Diabetes Maternal Grandmother    Diabetes Maternal Grandfather    Diabetes Paternal Grandmother    Diabetes Paternal Grandfather    Breast cancer Maternal Aunt 50 48    currently 50;53ALB2 mutation   Colon cancer Neg Hx    Heart disease Neg Hx     Allergies: Amoxicillin and Penicillins Current Outpatient Medications on File Prior to Visit  Medication Sig Dispense Refill   Erenumab-aooe (AIMOVIG) 140 MG/ML SOAJ Inject 140 mg into the skin every 28 (twenty-eight) days. 1.12 mL 5   nortriptyline (PAMELOR) 10 MG capsule TAKE 1 CAPSULE BY MOUTH EVERYDAY AT BEDTIME 30 capsule 0   rizatriptan (MAXALT) 10 MG tablet Take 1 tablet earliest onset of migraine.  May repeat in 2 hours if needed.  Maximum 2 tablets in 24 hours 10 tablet 5   [DISCONTINUED] famotidine (PEPCID) 20 MG tablet Take 1 tablet (20 mg total) by mouth 2 (two) times daily. 60 tablet 0   No current facility-administered medications on file prior to visit.    Social History   Tobacco Use   Smoking status: Every Day    Packs/day: 0.50    Types: Cigarettes   Smokeless tobacco: Never  Vaping Use   Vaping Use: Former  Substance Use Topics  Alcohol use: No   Drug use: Not Currently    Types: Marijuana    Review of Systems  Constitutional:  Negative for chills and fever.  Respiratory:  Negative for cough.   Cardiovascular:  Negative for chest pain and palpitations.  Gastrointestinal:  Negative for nausea and vomiting.  Musculoskeletal:  Positive for back pain.  Neurological:  Positive for headaches.  Psychiatric/Behavioral:  Negative for sleep disturbance and suicidal ideas. The patient is nervous/anxious.      Objective:    BP 102/74 (BP Location: Right Arm, Patient Position: Sitting, Cuff Size: Normal)   Pulse (!) 102   Temp 98.5 F (36.9 C) (Oral)   Ht '5\' 4"'  (1.626 m)   Wt 169 lb (76.7 kg)   SpO2 98%   BMI 29.01 kg/m  BP Readings  from Last 3 Encounters:  08/05/21 102/74  07/10/21 112/75  05/22/21 108/78   Wt Readings from Last 3 Encounters:  08/05/21 169 lb (76.7 kg)  07/10/21 170 lb 8 oz (77.3 kg)  05/22/21 168 lb 8 oz (76.4 kg)    Physical Exam Vitals reviewed.  Constitutional:      Appearance: She is well-developed.  Eyes:     Conjunctiva/sclera: Conjunctivae normal.  Cardiovascular:     Rate and Rhythm: Normal rate and regular rhythm.     Pulses: Normal pulses.     Heart sounds: Normal heart sounds.  Pulmonary:     Effort: Pulmonary effort is normal.     Breath sounds: Normal breath sounds. No wheezing, rhonchi or rales.  Skin:    General: Skin is warm and dry.  Neurological:     Mental Status: She is alert.  Psychiatric:        Speech: Speech normal.        Behavior: Behavior normal.        Thought Content: Thought content normal.       Assessment & Plan:   Problem List Items Addressed This Visit       Cardiovascular and Mediastinum   Migraines    Chronic, stable.  She is following with Dr. Tomi Likens, neurology.  Discussed patient's desire to conceive in the near future and how it is imperative this week to discuss medications which are safe to be taken during pregnancy specifically Aimovig, rizatriptan, nortriptyline.  She verbalized understanding of the importance      Relevant Medications   gabapentin (NEURONTIN) 100 MG capsule     Other   Anxiety    Overall well controlled on Wellbutrin.  Discussed teratogenicity with Wellbutrin if she were to conceive.  Advised against being on Wellbutrin in pregnancy.  Discussed Atarax however less comfortable with teratogenicity and safer profile with buspar.  Counseled on how to wean from Wellbutrin 150 mg and to start BuSpar.  Emphasized the importance of calling Dr. Marcelline Mates, OB/GYN immediately to schedule conception counseling appointment and discussed all of her medications, and the safety thereof in pregnancy.  Advised to start prenatal vitamin.       Relevant Medications   busPIRone (BUSPAR) 5 MG tablet   Family history of breast cancer in mother - Primary    Positive for breast cancer gene. Mother had breast cancer, diagnosed at age 38.  She is screening annually with MRI breasts and mammogram. Due for mammogram. Scheduled. Ordered MRI breasts to be scheduled March 2023      Relevant Orders   MR BREAST BILATERAL W WO CONTRAST INC CAD   LOW BACK PAIN    Chronic,  unchanged over the past 5 years.  Advised baseline lumbar xray, Declines Xray today in office. Declines PT. she describes that gabapentin has been very  helpful for her, and she is very hesitant to stop this medication.  As she is not currently pregnant, she would like to continue gabapentin and she understands to stop this medication and start Tylenol if she suspects pregnancy.  She will also discuss this further with Dr. Marcelline Mates.  She understands the risk of gabapentin to baby, pregnancy are not clear.  I provided 30-day supply without refill      Relevant Medications   gabapentin (NEURONTIN) 100 MG capsule     I have discontinued Zell M. Eilers's benzonatate and buPROPion. I am also having her start on busPIRone. Additionally, I am having her maintain her rizatriptan, nortriptyline, Aimovig, and gabapentin.   Meds ordered this encounter  Medications   busPIRone (BUSPAR) 5 MG tablet    Sig: Take 1 tablet (5 mg total) by mouth 3 (three) times daily.    Dispense:  60 tablet    Refill:  1    Order Specific Question:   Supervising Provider    Answer:   Deborra Medina L [[id number]]   gabapentin (NEURONTIN) 100 MG capsule    Sig: Take 2 capsules (200 mg total) by mouth 2 (two) times daily.    Dispense:  120 capsule    Refill:  0    Order Specific Question:   Supervising Provider    Answer:   Crecencio Mc [[id number]]    Return precautions given.   Risks, benefits, and alternatives of the medications and treatment plan prescribed today were discussed, and patient  expressed understanding.   Education regarding symptom management and diagnosis given to patient on AVS.  Continue to follow with Flinchum, Kelby Aline, FNP for routine health maintenance.   Isabel [name] and I agreed with plan.   Mable Paris, FNP

## 2021-08-05 NOTE — Assessment & Plan Note (Addendum)
Positive for breast cancer gene. Mother had breast cancer, diagnosed at age 30.  She is screening annually with MRI breasts and mammogram. Due for mammogram. Scheduled. Ordered MRI breasts to be scheduled March 2023

## 2021-08-06 DIAGNOSIS — G43909 Migraine, unspecified, not intractable, without status migrainosus: Secondary | ICD-10-CM | POA: Insufficient documentation

## 2021-08-06 NOTE — Assessment & Plan Note (Signed)
Overall well controlled on Wellbutrin.  Discussed teratogenicity with Wellbutrin if she were to conceive.  Advised against being on Wellbutrin in pregnancy.  Discussed Atarax however less comfortable with teratogenicity and safer profile with buspar.  Counseled on how to wean from Wellbutrin 150 mg and to start BuSpar.  Emphasized the importance of calling Dr. Marcelline Mates, OB/GYN immediately to schedule conception counseling appointment and discussed all of her medications, and the safety thereof in pregnancy.  Advised to start prenatal vitamin.

## 2021-08-06 NOTE — Assessment & Plan Note (Signed)
Chronic, stable.  She is following with Dr. Tomi Likens, neurology.  Discussed patient's desire to conceive in the near future and how it is imperative this week to discuss medications which are safe to be taken during pregnancy specifically Aimovig, rizatriptan, nortriptyline.  She verbalized understanding of the importance

## 2021-08-18 ENCOUNTER — Ambulatory Visit
Admission: RE | Admit: 2021-08-18 | Discharge: 2021-08-18 | Disposition: A | Discharge status: Home / Self Care | Payer: PRIVATE HEALTH INSURANCE | Source: Ambulatory Visit | Attending: Obstetrics and Gynecology | Admitting: Obstetrics and Gynecology

## 2021-08-18 ENCOUNTER — Other Ambulatory Visit: Payer: Self-pay

## 2021-08-18 DIAGNOSIS — Z1501 Genetic susceptibility to malignant neoplasm of breast: Secondary | ICD-10-CM | POA: Insufficient documentation

## 2021-08-18 DIAGNOSIS — Z1231 Encounter for screening mammogram for malignant neoplasm of breast: Secondary | ICD-10-CM | POA: Insufficient documentation

## 2021-08-18 DIAGNOSIS — Z803 Family history of malignant neoplasm of breast: Secondary | ICD-10-CM | POA: Insufficient documentation

## 2021-08-18 IMAGING — MG MM DIGITAL SCREENING BILAT W/ TOMO AND CAD
8 series · 9 of 24 positions shown · non-contrast
Comparison: Previous exam(s).

CLINICAL DATA: Screening.

EXAM:
DIGITAL SCREENING BILATERAL MAMMOGRAM WITH TOMOSYNTHESIS AND CAD
TECHNIQUE: Bilateral screening digital craniocaudal and mediolateral oblique
mammograms were obtained. Bilateral screening digital breast
tomosynthesis was performed. The images were evaluated with
computer-aided detection.

[R MLO synth-2D]
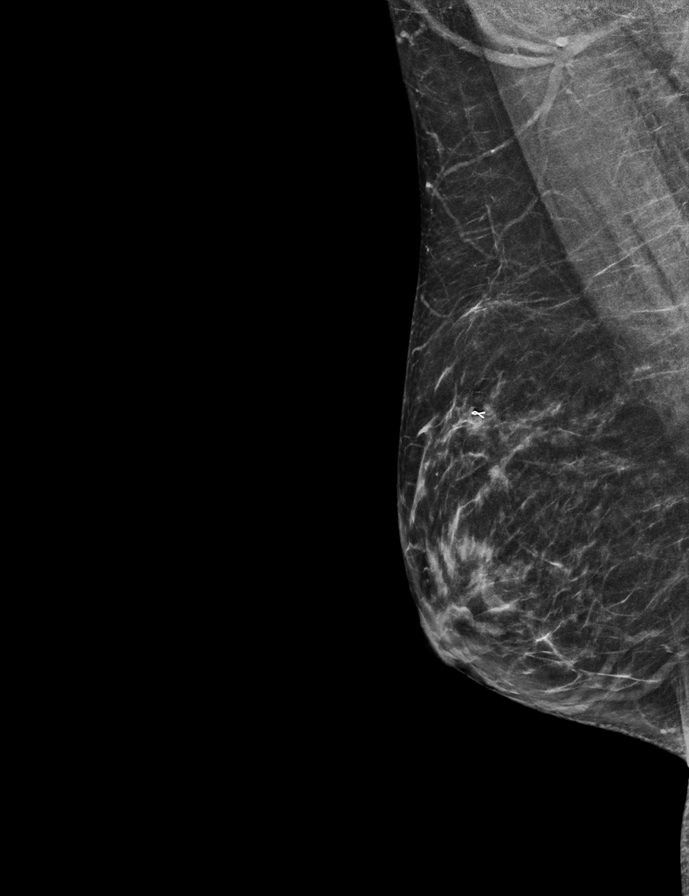

[R CC synth-2D]
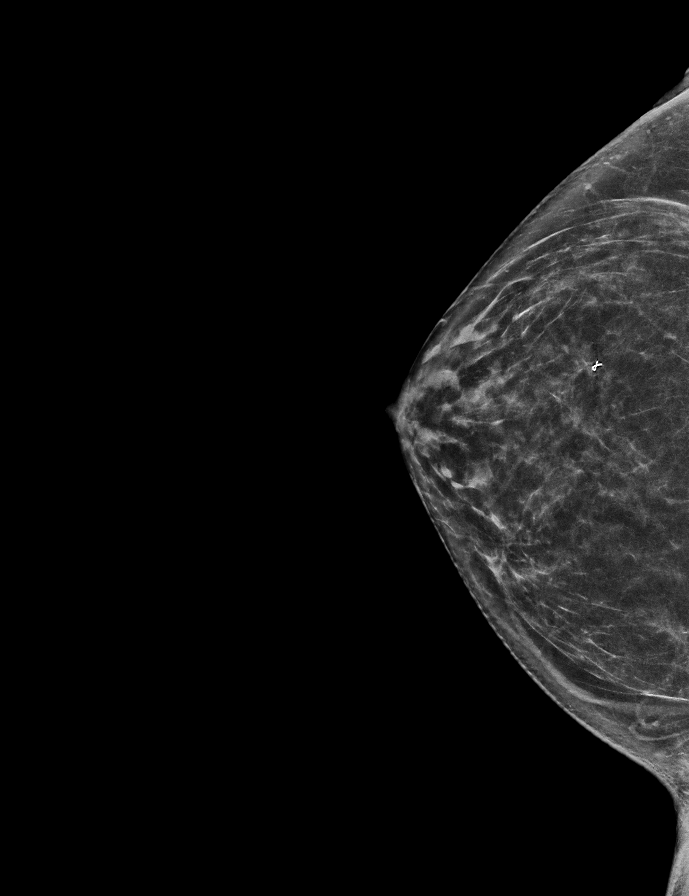

[L CC synth-2D]
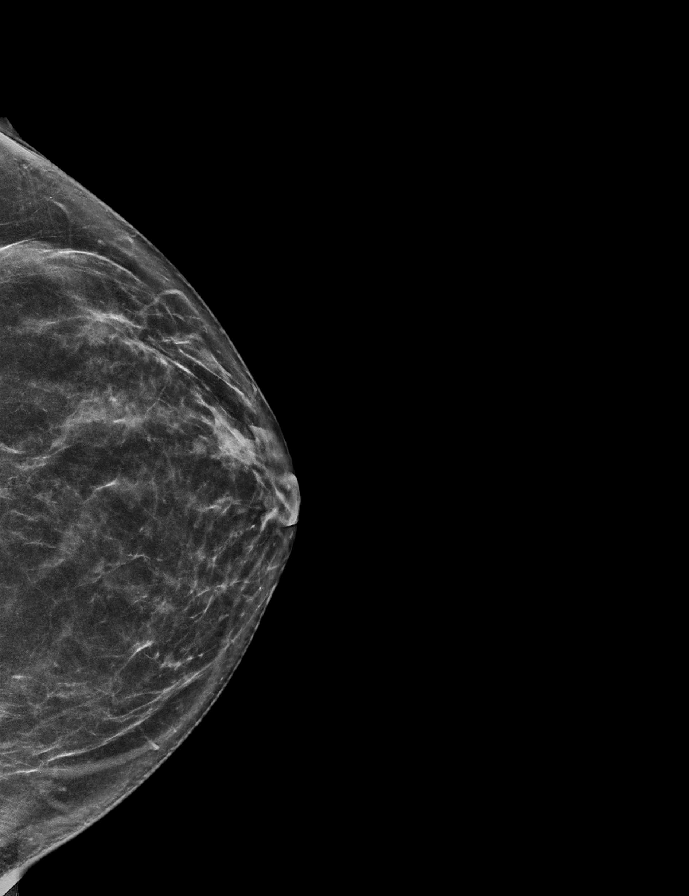

[L MLO synth-2D]
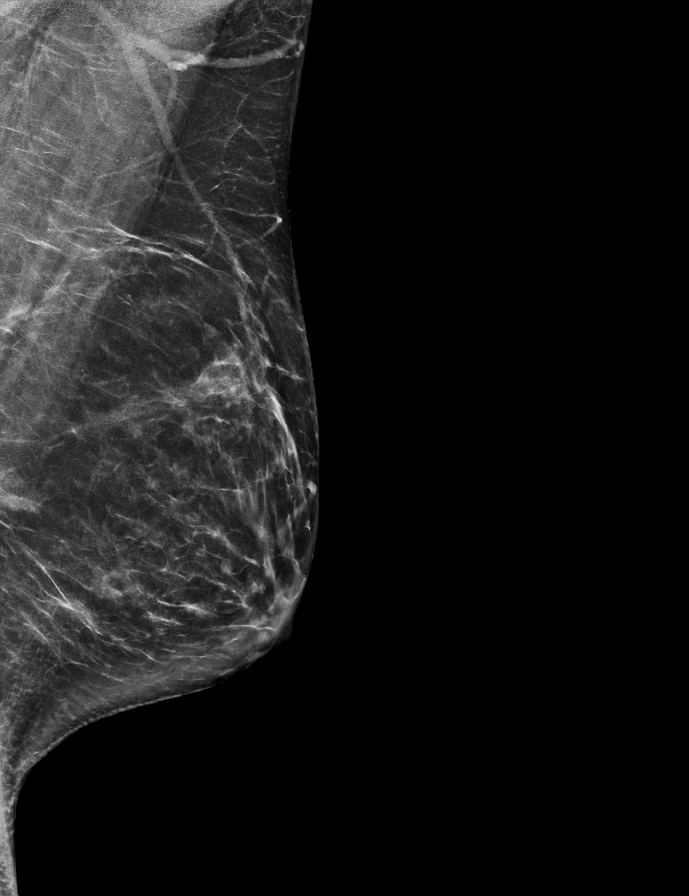

[R MLO tomo · 2 of 58 frames shown]
[frame 19/58]
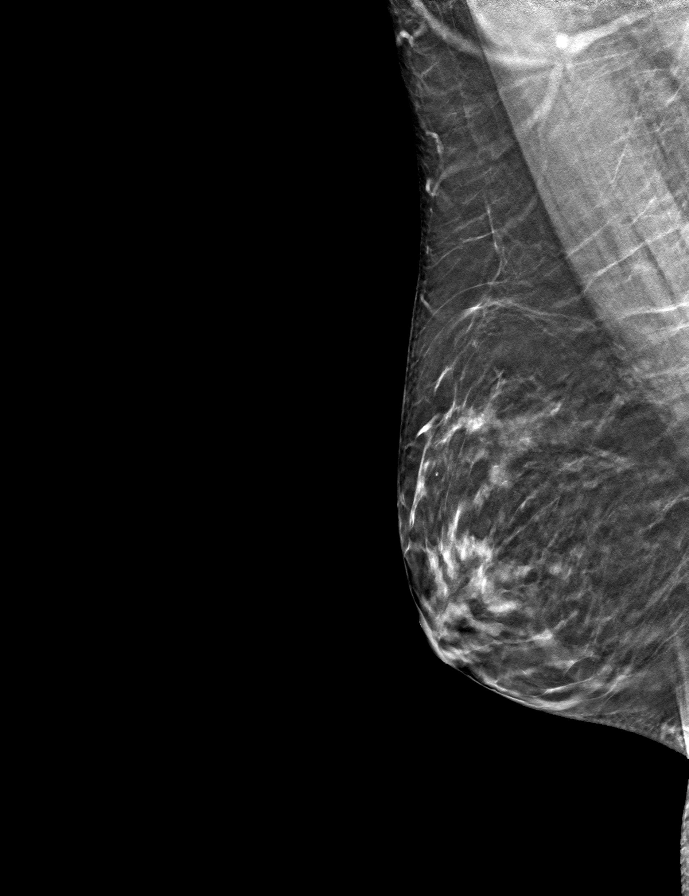
[frame 29/58]
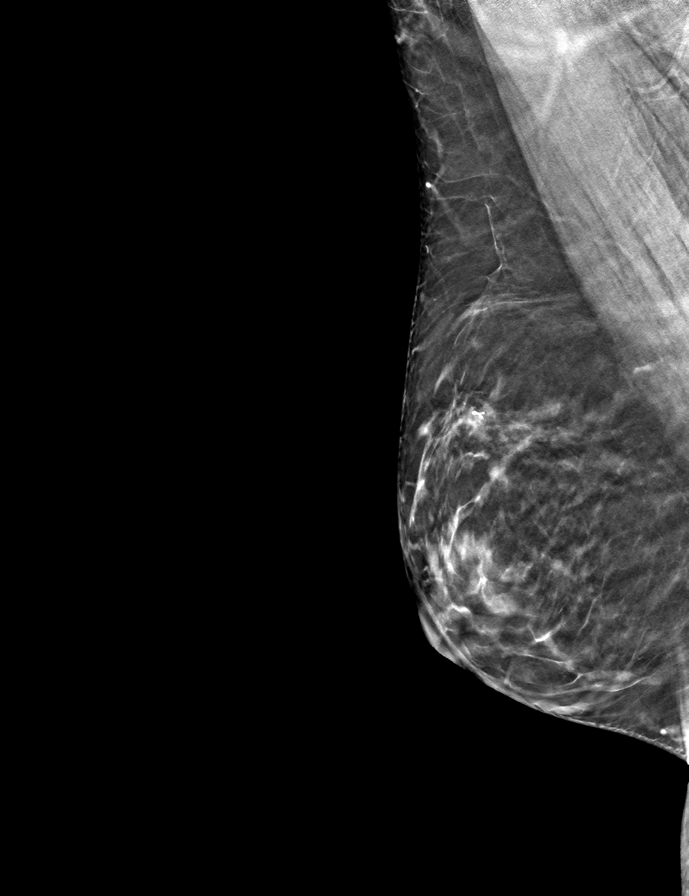

[R CC tomo · tomo slice 32/63.0]
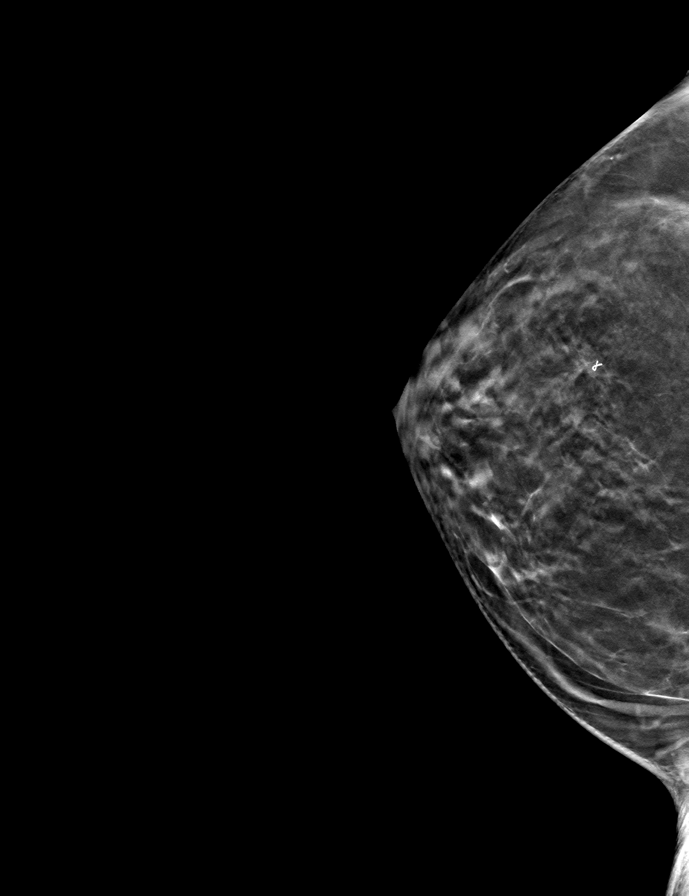

[L CC tomo · tomo slice 31/61.0]
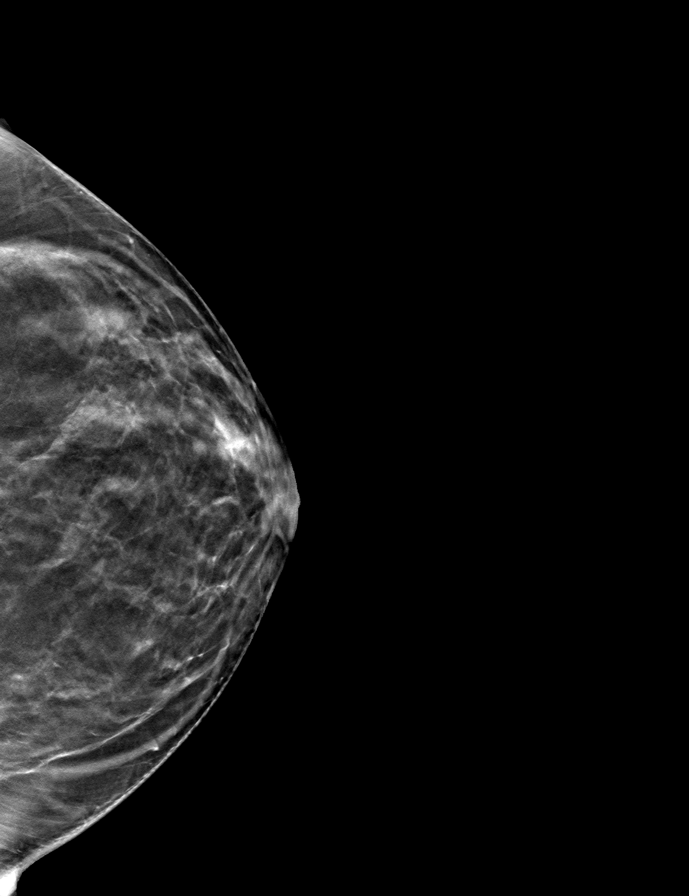

[L MLO tomo · tomo slice 35/68.0]
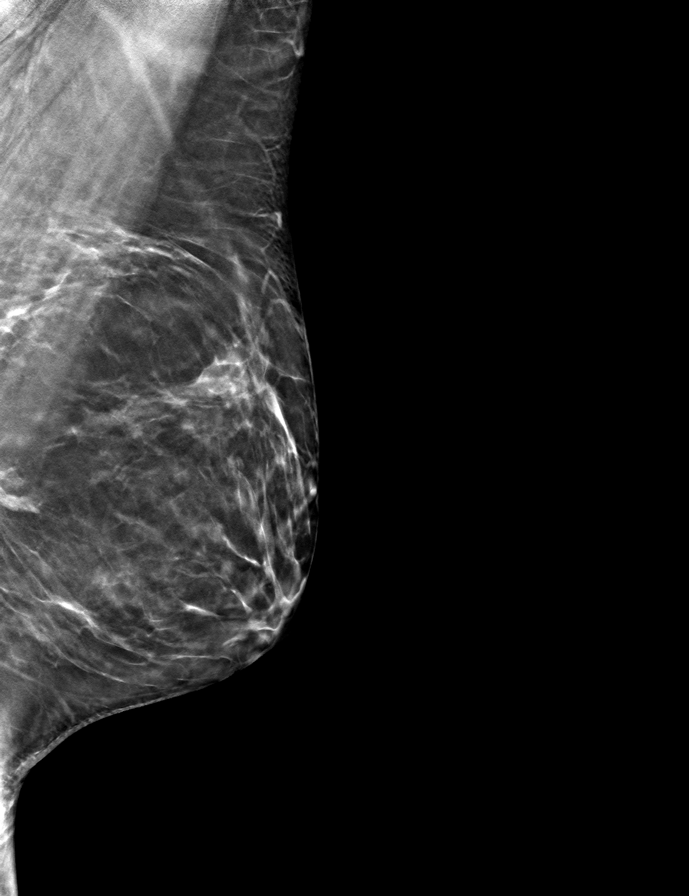

[9 of 24 positions shown; findings below may reference images not displayed]

ACR Breast Density Category b: There are scattered areas of
fibroglandular density.
FINDINGS: In the left breast, a possible asymmetry warrants further
evaluation. In the right breast, no findings suspicious for
malignancy.
IMPRESSION: Further evaluation is suggested for possible asymmetry in the left
breast.

RECOMMENDATION:
Diagnostic mammogram and possibly ultrasound of the left breast.
(Code:[1K])

The patient will be contacted regarding the findings, and additional
imaging will be scheduled.

BI-RADS CATEGORY  0: Incomplete. Need additional imaging evaluation
and/or prior mammograms for comparison.

## 2021-08-21 ENCOUNTER — Other Ambulatory Visit: Payer: Self-pay | Admitting: Obstetrics and Gynecology

## 2021-08-21 DIAGNOSIS — R928 Other abnormal and inconclusive findings on diagnostic imaging of breast: Secondary | ICD-10-CM

## 2021-08-21 DIAGNOSIS — N6489 Other specified disorders of breast: Secondary | ICD-10-CM

## 2021-08-24 ENCOUNTER — Other Ambulatory Visit: Payer: Self-pay | Admitting: Obstetrics and Gynecology

## 2021-08-24 DIAGNOSIS — Z803 Family history of malignant neoplasm of breast: Secondary | ICD-10-CM

## 2021-08-24 DIAGNOSIS — R928 Other abnormal and inconclusive findings on diagnostic imaging of breast: Secondary | ICD-10-CM

## 2021-08-24 DIAGNOSIS — Z1501 Genetic susceptibility to malignant neoplasm of breast: Secondary | ICD-10-CM

## 2021-08-27 ENCOUNTER — Encounter: Payer: No Typology Code available for payment source | Admitting: Adult Health

## 2021-09-02 ENCOUNTER — Ambulatory Visit
Admission: RE | Admit: 2021-09-02 | Discharge: 2021-09-02 | Disposition: A | Discharge status: Home / Self Care | Payer: PRIVATE HEALTH INSURANCE | Source: Ambulatory Visit | Attending: Obstetrics and Gynecology | Admitting: Obstetrics and Gynecology

## 2021-09-02 ENCOUNTER — Other Ambulatory Visit: Payer: Self-pay

## 2021-09-02 DIAGNOSIS — N6489 Other specified disorders of breast: Secondary | ICD-10-CM

## 2021-09-02 DIAGNOSIS — R928 Other abnormal and inconclusive findings on diagnostic imaging of breast: Secondary | ICD-10-CM | POA: Insufficient documentation

## 2021-09-02 IMAGING — US US BREAST*L* LIMITED INC AXILLA
1 series · 3 of 3 positions shown · non-contrast
Comparison: MRI [DATE] and mammogram [DATE]

CLINICAL DATA: Patient returns after screening study for evaluation
of possible LEFT breast asymmetry. Patient's mother was diagnosed
with breast cancer at age 39; her maternal aunt was diagnosed with
breast cancer at age 50. The patient and her mother are positive for
the [Z9] 2 gene.

EXAM:
DIGITAL DIAGNOSTIC UNILATERAL LEFT MAMMOGRAM WITH TOMOSYNTHESIS AND
CAD; ULTRASOUND LEFT BREAST LIMITED
TECHNIQUE: Left digital diagnostic mammography and breast tomosynthesis was
performed. The images were evaluated with computer-aided detection.;
Targeted ultrasound examination of the left breast was performed.

[Series 1: us breast*left* limited inc axilla · 0.06mm/px · 3 of 3 slices shown]
[im 1/3]
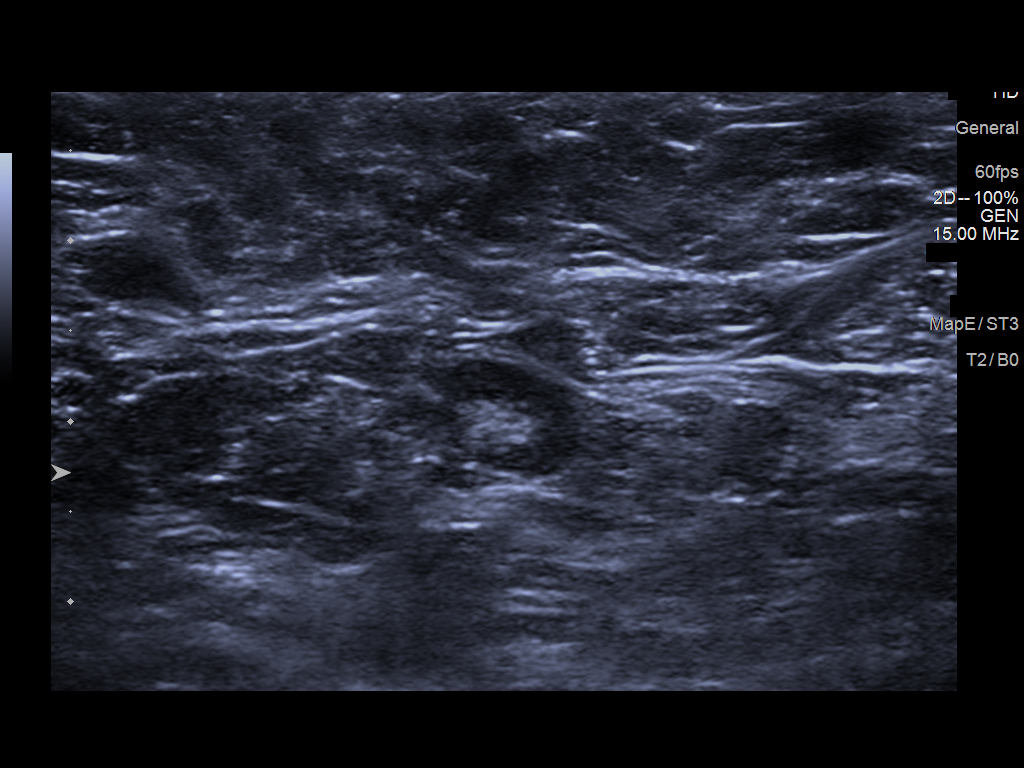
[im 2/3]
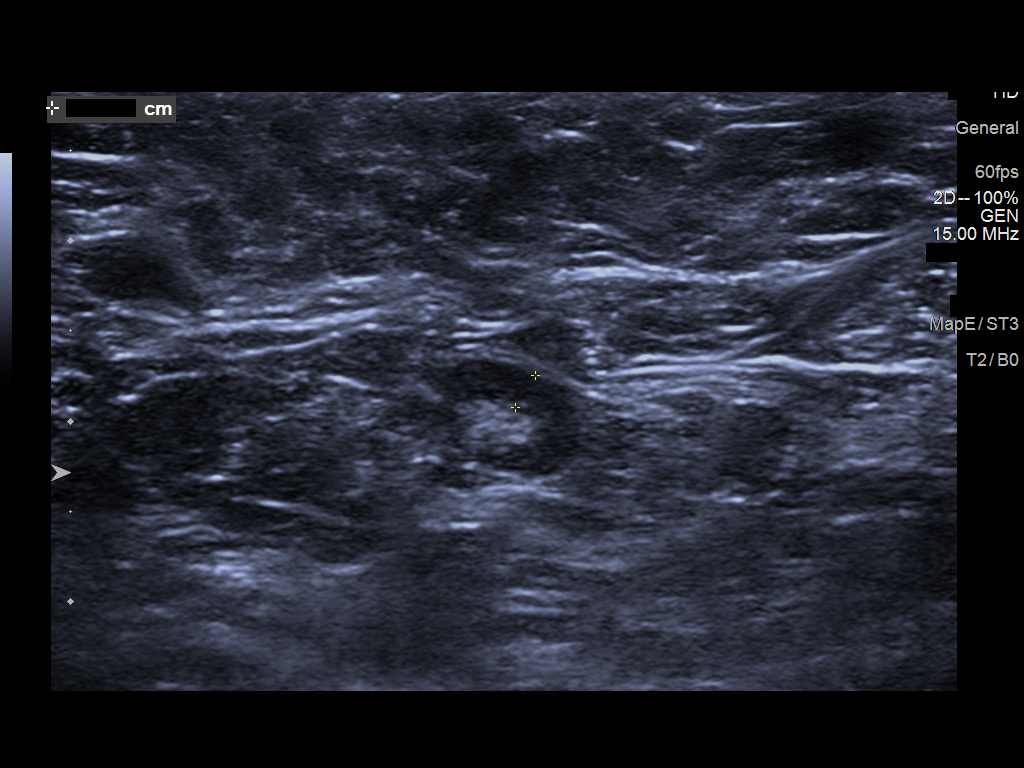
[im 3/3]
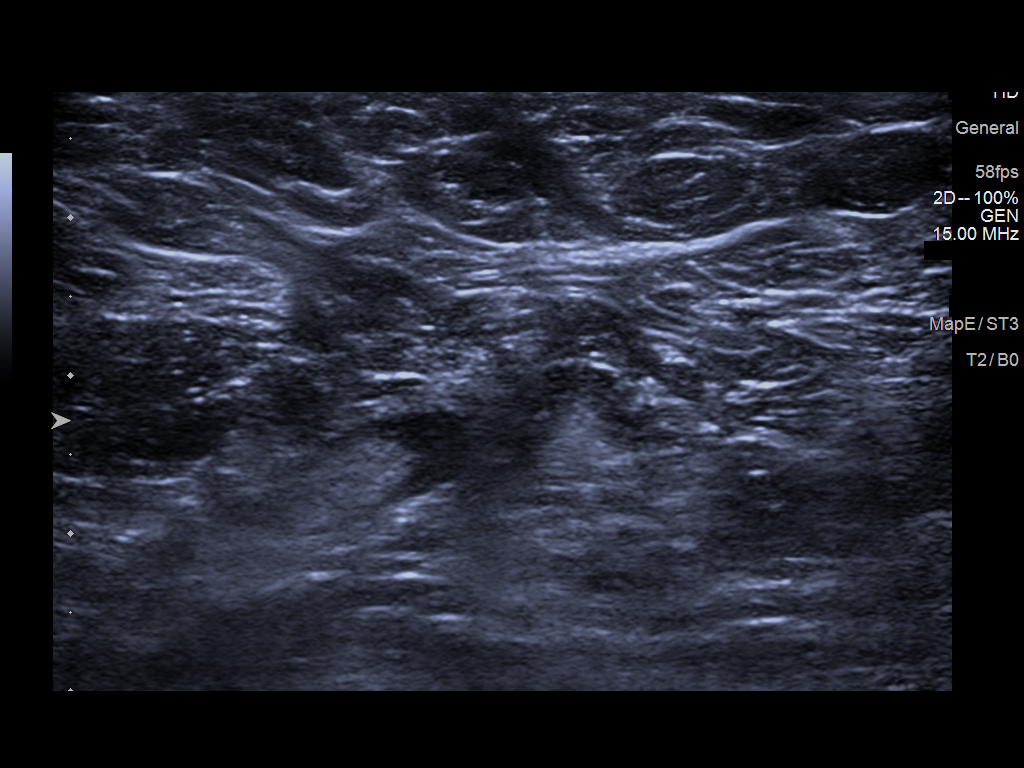

[3 of 3 positions shown; findings below may reference images not displayed]

ACR Breast Density Category c: The breast tissue is heterogeneously
dense, which may obscure small masses.
FINDINGS: Additional 2-D and 3-D images are performed. These views show
persistent asymmetry in the posterior central portion of the LEFT
breast, best seen on oblique and true LATERAL projections. The
abnormality is not well seen on additional craniocaudal views.

On physical exam, I palpate no abnormality in the central MEDIAL or
central LATERAL portions of the LEFT breast.

Targeted ultrasound is performed, showing normal fibroglandular
tissue throughout the MEDIAL and LATERAL central portions of the
LEFT breast. No suspicious mass, distortion, or acoustic shadowing
is demonstrated with ultrasound.

Evaluation of the LEFT axilla is negative for adenopathy.
IMPRESSION: 1. Persistent asymmetry in the posterior central portion of the LEFT
breast without sonographic correlate. When compared to previous MRI,
this area is favored to represent benign asymmetric tissue possibly
in the MEDIAL posterior portion of the breast. However, given the
patient's family history and genetic susceptibility, recommend
further evaluation with stereotactic biopsy.
2. Given the posterior location of the asymmetry, stereotactic
guided core biopsy may be difficult. If the biopsy is not possible,
recommend follow-up breast MRI and MR guided core biopsy as needed.

RECOMMENDATION:
Stereotactic guided core biopsy of LEFT breast asymmetry.

I have discussed the findings and recommendations with the patient
and her mother. If applicable, a reminder letter will be sent to the
patient regarding the next appointment.

BI-RADS CATEGORY  4: Suspicious.

## 2021-09-03 ENCOUNTER — Other Ambulatory Visit: Payer: Self-pay | Admitting: Obstetrics and Gynecology

## 2021-09-03 DIAGNOSIS — N6489 Other specified disorders of breast: Secondary | ICD-10-CM

## 2021-09-03 DIAGNOSIS — R928 Other abnormal and inconclusive findings on diagnostic imaging of breast: Secondary | ICD-10-CM

## 2021-09-05 MED ORDER — ALPRAZOLAM 0.5 MG PO TABS: 0.5000 mg | ORAL_TABLET | Freq: Once | ORAL | 0 refills | Status: DC

## 2021-09-10 ENCOUNTER — Ambulatory Visit
Admission: RE | Admit: 2021-09-10 | Discharge: 2021-09-10 | Disposition: A | Discharge status: Home / Self Care | Payer: PRIVATE HEALTH INSURANCE | Source: Ambulatory Visit | Attending: Obstetrics and Gynecology | Admitting: Obstetrics and Gynecology

## 2021-09-10 ENCOUNTER — Other Ambulatory Visit: Payer: Self-pay

## 2021-09-10 DIAGNOSIS — N6489 Other specified disorders of breast: Secondary | ICD-10-CM

## 2021-09-10 DIAGNOSIS — R928 Other abnormal and inconclusive findings on diagnostic imaging of breast: Secondary | ICD-10-CM | POA: Insufficient documentation

## 2021-09-10 HISTORY — PX: BREAST BIOPSY: SHX20

## 2021-09-10 IMAGING — MG MM BREAST LOCALIZATION CLIP
6 series · 6 of 18 positions shown · non-contrast
Comparison: Previous exam(s).

CLINICAL DATA: Biopsy of a left breast asymmetry only seen on the
90 degree lateral and MLO views.

EXAM:
3D DIAGNOSTIC LEFT MAMMOGRAM POST STEREOTACTIC BIOPSY

[L CC synth-2D]
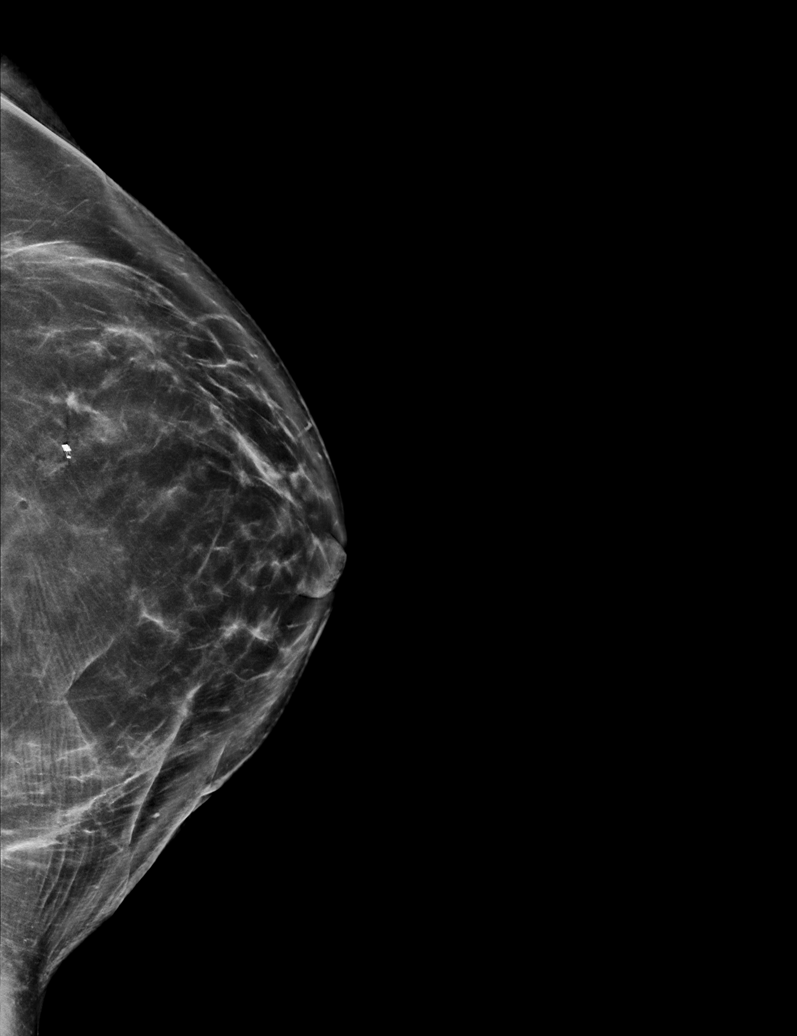

[L MLO synth-2D]
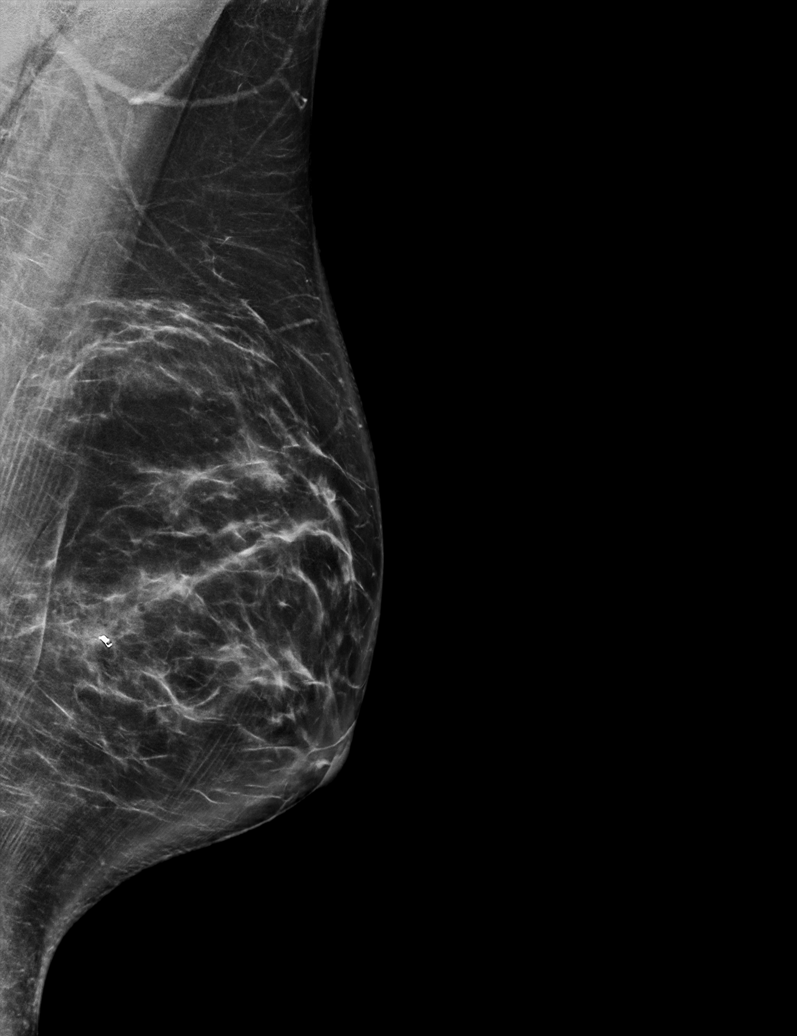

[L ML synth-2D]
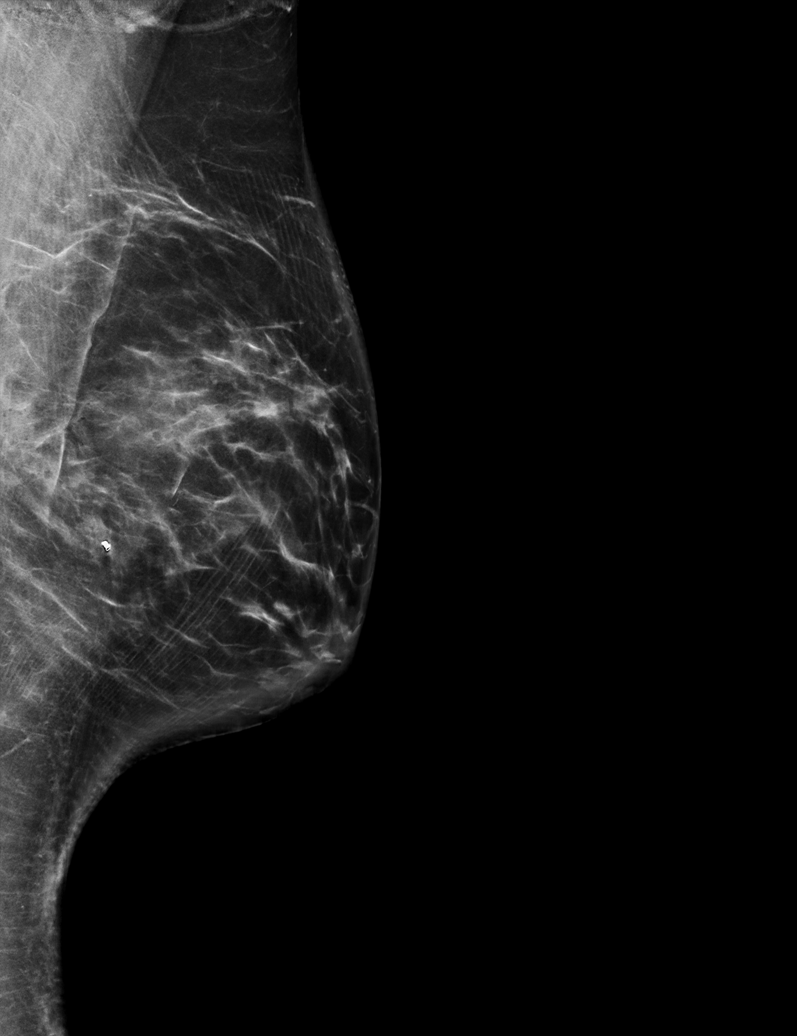

[L CC tomo · tomo slice 41/81.0]
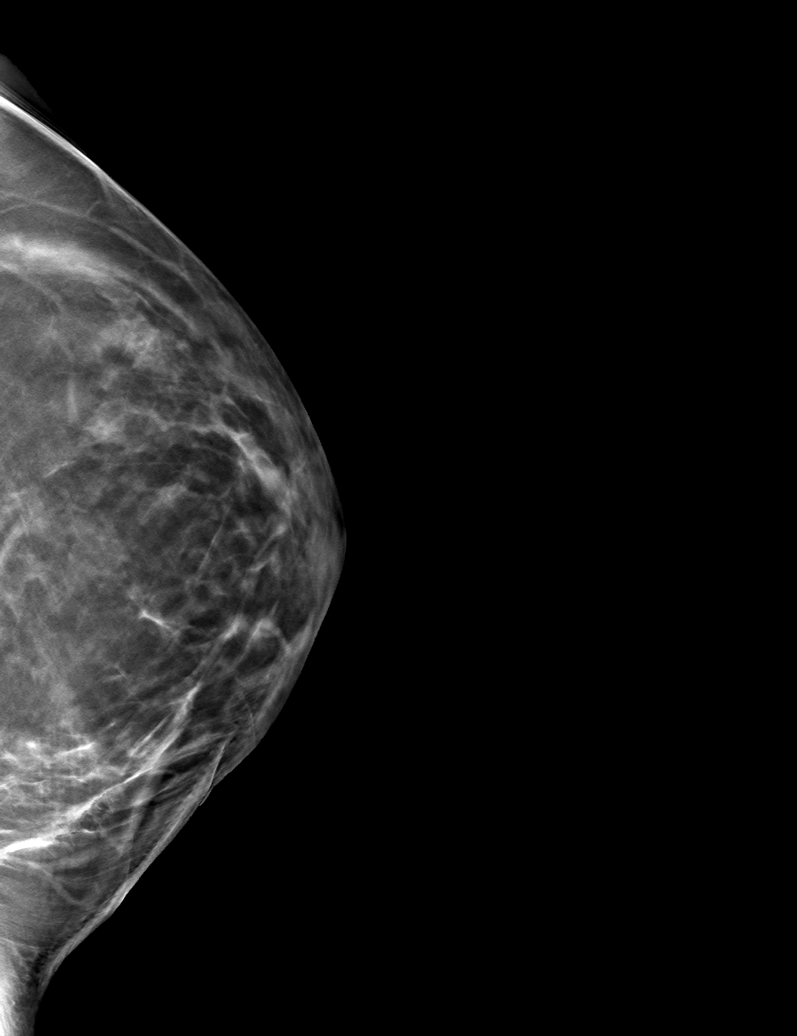

[L MLO tomo · tomo slice 38/75.0]
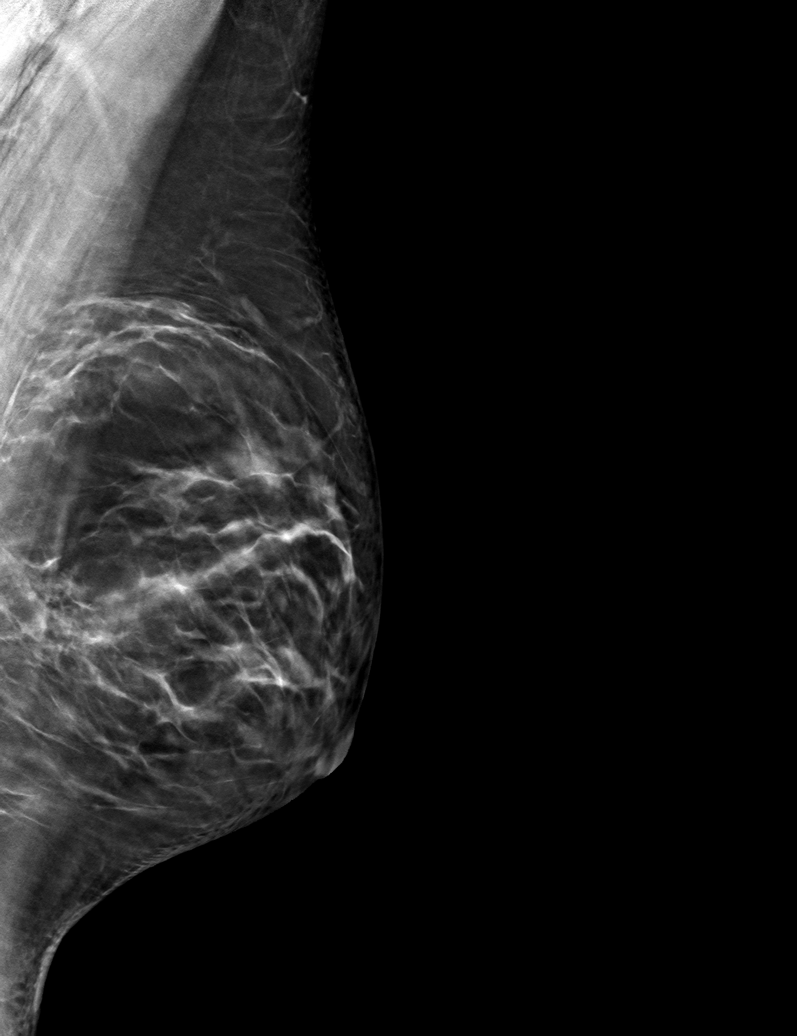

[L ML tomo · tomo slice 42/83.0]
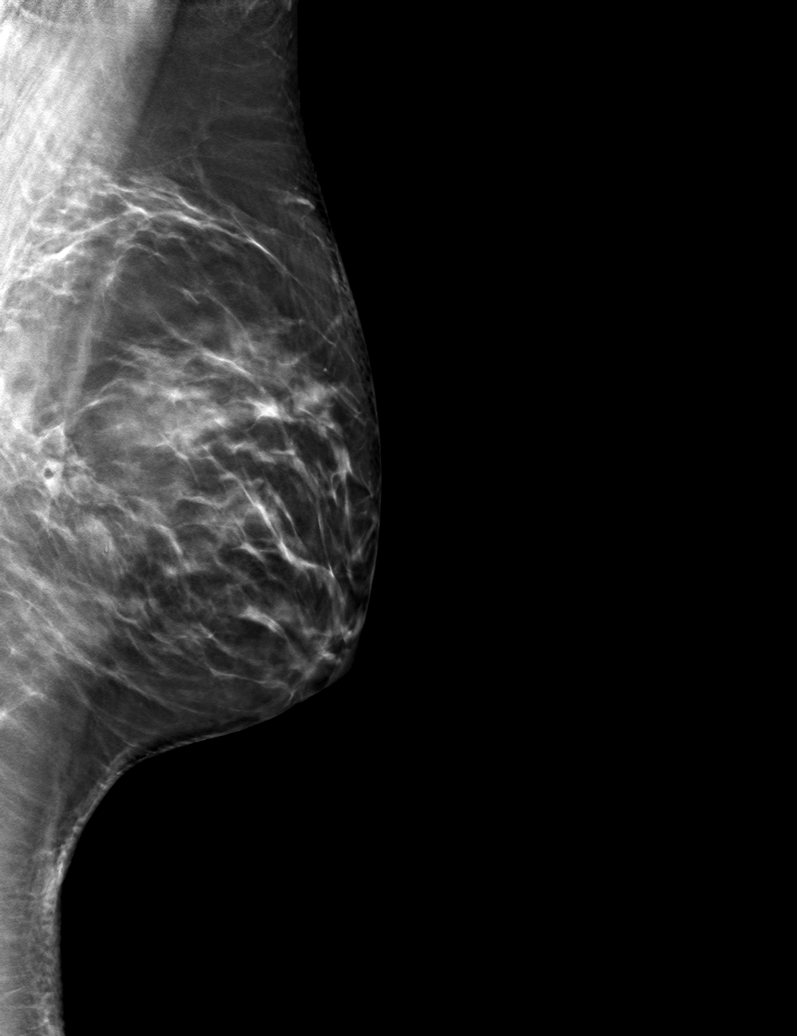

[6 of 18 positions shown; findings below may reference images not displayed]

FINDINGS: 3D Mammographic images were obtained following stereotactic guided
biopsy of a left breast asymmetry. On the MLO view, the coil shaped
clip appears to be just anterior to the targeted asymmetry. There is
air throughout the site of the asymmetry and I believe the asymmetry
was likely biopsied with clip migration. On the cc view,
localization of the clip relative to the biopsy site cannot be
confidently assessed as the abnormality was never seen on the cc
view.
IMPRESSION: On the MLO view, the coil shaped clip appears to be just anterior to
the targeted asymmetry. There is air throughout the site of the
asymmetry and I believe the asymmetry was likely biopsied with clip
migration. On the cc view, localization of the clip relative to the
biopsy site cannot be confidently assessed as the abnormality was
never seen on the cc view.

The patient is scheduled for an MRI in the near future. Recommend
assessment of the clip relative to the island of tissue thought to
correlate on the previous MRI.

If the biopsy is benign, recommend a six-month follow-up mammogram.

Final Assessment: Post Procedure Mammograms for Marker Placement

## 2021-09-10 IMAGING — MG MM BREAST BX W LOC DEV 1ST LESION IMAGE BX SPEC STEREO GUIDE*L*
8 of 9 series · 8 of 17 positions shown · non-contrast
Comparison: Previous exams.
COMPARISON: Previous exams.

Addendum:
CLINICAL DATA: Stereotactic biopsy of a left breast asymmetry

EXAM:
LEFT BREAST STEREOTACTIC CORE NEEDLE BIOPSY

[L (1 of 6)]
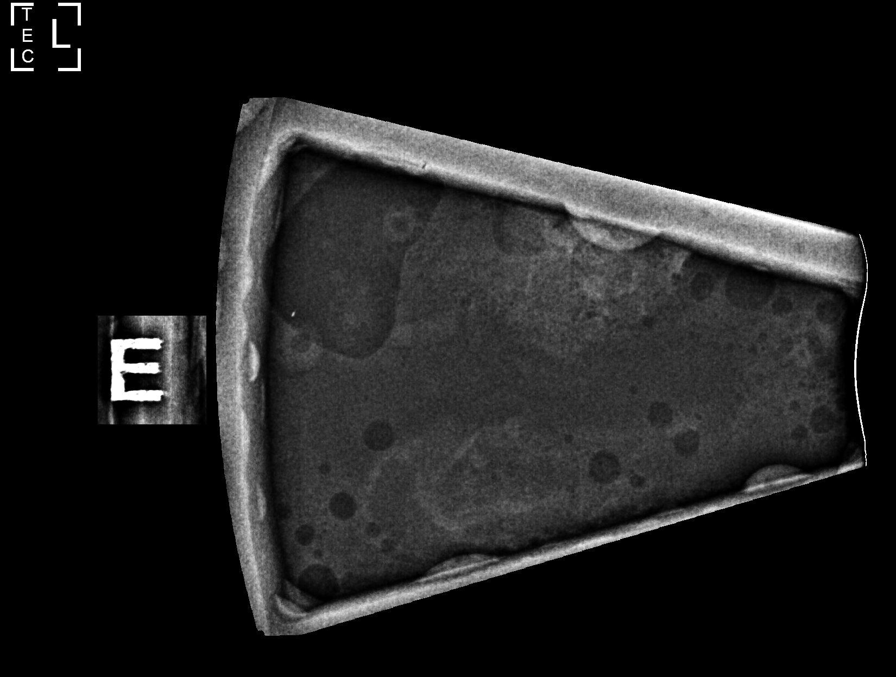

[L (2 of 6)]
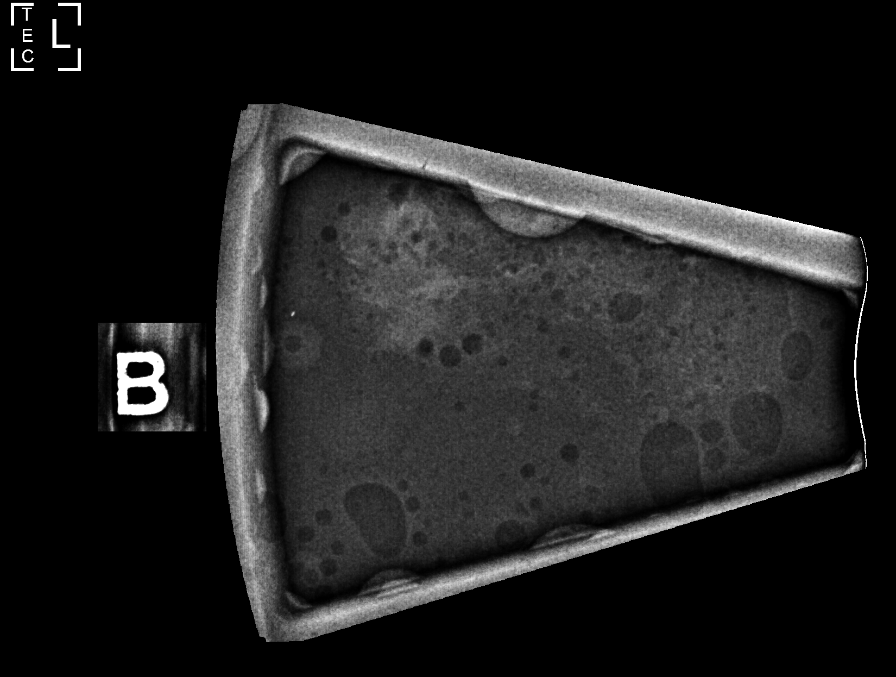

[L (3 of 6)]
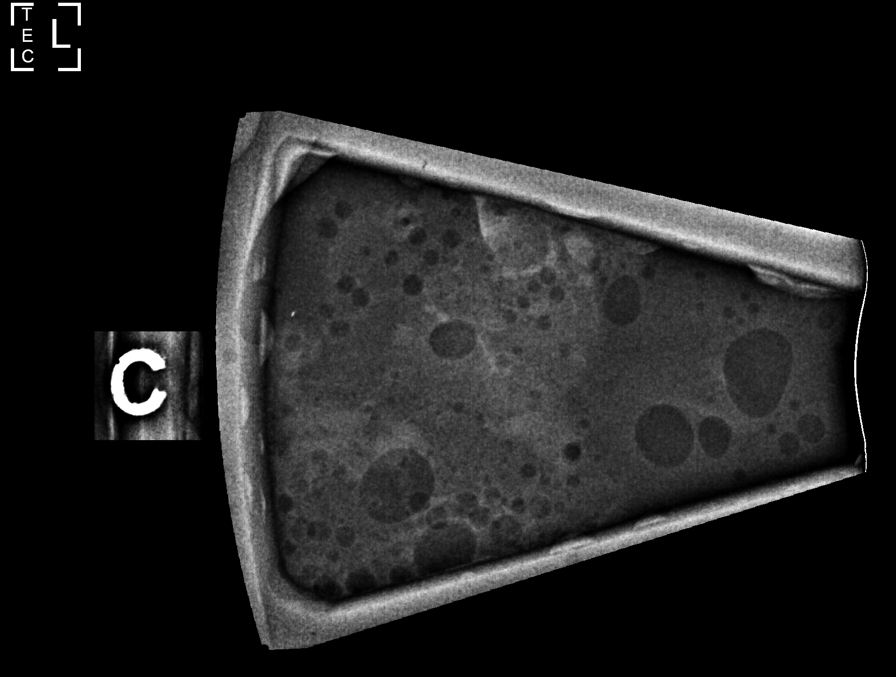

[L (4 of 6)]
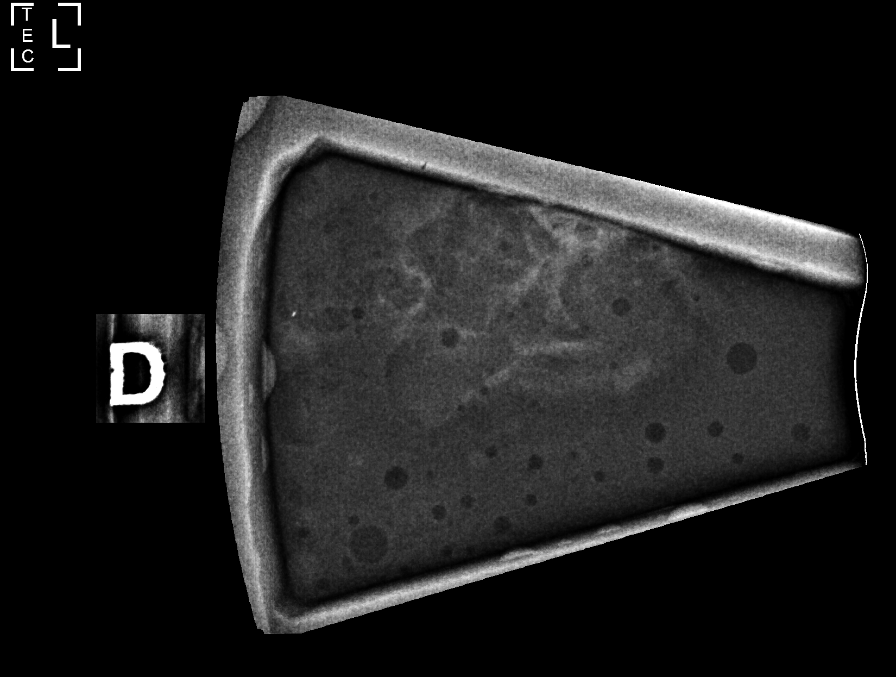

[L (5 of 6)]
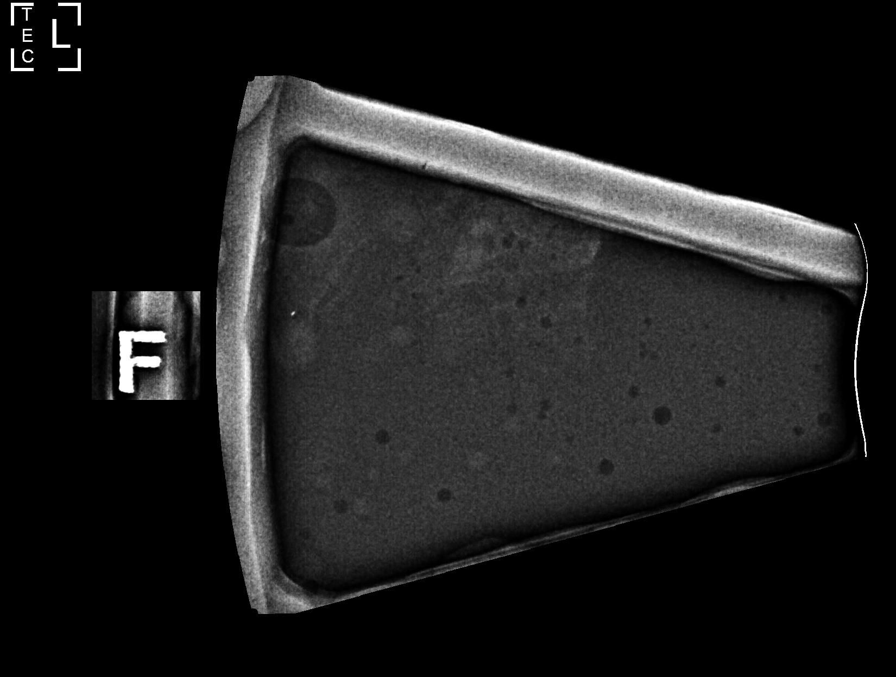

[L (6 of 6)]
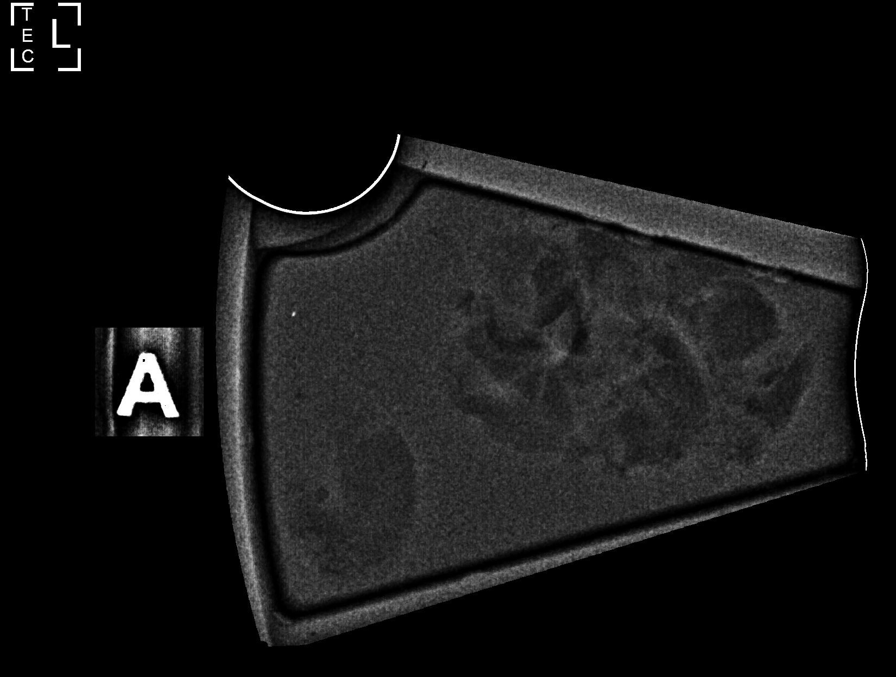

[L MLO]
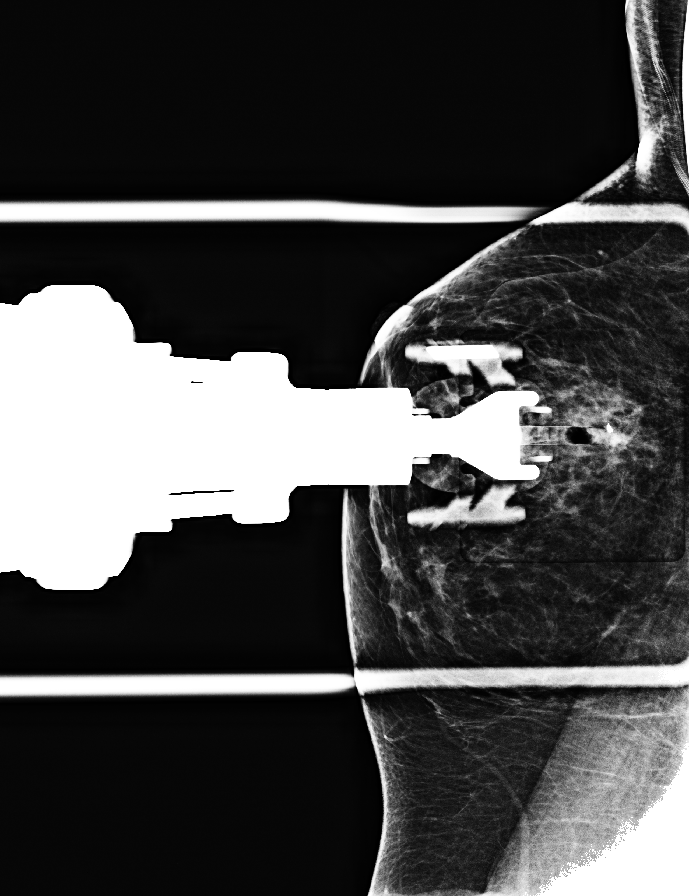

[L MLO tomo · tomo slice 31/60.0]
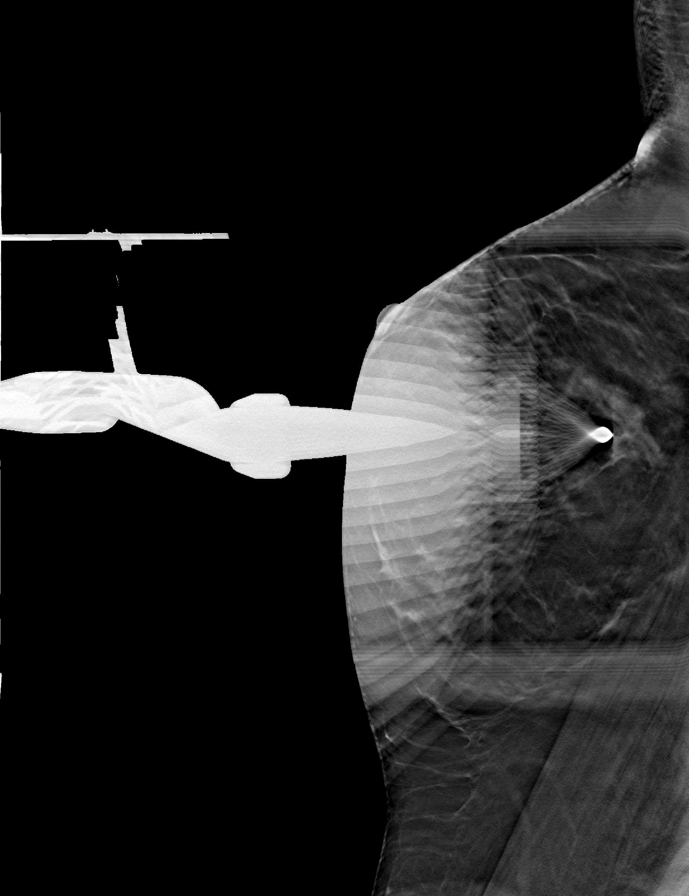

[8 of 17 positions shown; findings below may reference images not displayed]



Using sterile technique and 1% Lidocaine as local anesthetic, under
stereotactic guidance, a 9 gauge vacuum assisted device was used to
perform core needle biopsy of an asymmetry in the posterior left
breast near the level the nipple using a medial approach.

Lesion quadrant: Posterior central left breast

At the conclusion of the procedure, a coil shaped tissue marker clip
was deployed into the biopsy cavity. Follow-up 2-view mammogram was
performed and dictated separately.
IMPRESSION: Stereotactic-guided biopsy of the left breast asymmetry. No apparent
complications.

ADDENDUM:
PATHOLOGY revealed: A. LEFT BREAST, CENTRAL POSTERIOR ASYMMETRY;
STEREOTACTIC BIOPSY: - BENIGN BREAST TISSUE WITH PSEUDOANGIOMATOUS
STROMAL HYPERPLASIA. - NEGATIVE FOR ATYPIA AND MALIGNANCY.

Pathology results are CONCORDANT with imaging findings, per Dr.
BITCOINFAN.

Pathology results and recommendations were discussed with patient
via telephone on [DATE]. Patient reported biopsy site doing well
with no adverse symptoms, and only slight tenderness at the site.
Post biopsy care instructions were reviewed, questions were answered
and my direct phone number was provided. Patient was instructed to
call for any additional questions or concerns related to biopsy
site.

RECOMMENDATION: Patient instructed to return in six months for
unilateral LEFT breast diagnostic mammogram and possible ultrasound
to ensure stability of biopsied area. Patient informed a reminder
notice will be sent regarding this appointment and she will need to
call mammography site to schedule this appointment.

NOTE: Patient is scheduled for an bilateral breast MRI in the near
future. Recommend assessment of the clip relative to the island of
tissue thought to correlate on the previous MRI.

Pathology results reported by BITCOINFAN RN on [DATE].



Using sterile technique and 1% Lidocaine as local anesthetic, under
stereotactic guidance, a 9 gauge vacuum assisted device was used to
perform core needle biopsy of an asymmetry in the posterior left
breast near the level the nipple using a medial approach.

Lesion quadrant: Posterior central left breast

At the conclusion of the procedure, a coil shaped tissue marker clip
was deployed into the biopsy cavity. Follow-up 2-view mammogram was
performed and dictated separately.
IMPRESSION: Stereotactic-guided biopsy of the left breast asymmetry. No apparent
complications.

## 2021-09-11 LAB — SURGICAL PATHOLOGY

## 2021-09-12 MED ORDER — NORTRIPTYLINE HCL 10 MG PO CAPS: ORAL_CAPSULE | ORAL | 5 refills | Status: DC

## 2021-09-16 ENCOUNTER — Ambulatory Visit: Payer: PRIVATE HEALTH INSURANCE | Admitting: Family

## 2021-09-30 ENCOUNTER — Encounter: Payer: Self-pay | Admitting: Family

## 2021-10-11 ENCOUNTER — Other Ambulatory Visit: Payer: Self-pay | Admitting: Family

## 2021-10-11 ENCOUNTER — Other Ambulatory Visit: Payer: Self-pay | Admitting: Obstetrics and Gynecology

## 2021-10-11 DIAGNOSIS — F419 Anxiety disorder, unspecified: Secondary | ICD-10-CM

## 2021-10-15 ENCOUNTER — Other Ambulatory Visit: Payer: Self-pay

## 2021-10-15 ENCOUNTER — Ambulatory Visit (INDEPENDENT_AMBULATORY_CARE_PROVIDER_SITE_OTHER): Payer: PRIVATE HEALTH INSURANCE | Admitting: Family

## 2021-10-15 ENCOUNTER — Encounter: Payer: Self-pay | Admitting: Family

## 2021-10-15 VITALS — BP 98/70 | HR 95 | Temp 96.1°F | Ht 64.0 in | Wt 174.6 lb

## 2021-10-15 DIAGNOSIS — F419 Anxiety disorder, unspecified: Secondary | ICD-10-CM

## 2021-10-15 LAB — CBC WITH DIFFERENTIAL/PLATELET
Basophils Absolute: 0 10*3/uL (ref 0.0–0.1)
Basophils Relative: 0.4 % (ref 0.0–3.0)
Eosinophils Absolute: 0.2 10*3/uL (ref 0.0–0.7)
Eosinophils Relative: 3.1 % (ref 0.0–5.0)
HCT: 44.7 % (ref 36.0–46.0)
Hemoglobin: 15.1 g/dL — ABNORMAL HIGH (ref 12.0–15.0)
Lymphocytes Relative: 22.4 % (ref 12.0–46.0)
Lymphs Abs: 1.6 10*3/uL (ref 0.7–4.0)
MCHC: 33.8 g/dL (ref 30.0–36.0)
MCV: 92.9 fl (ref 78.0–100.0)
Monocytes Absolute: 0.5 10*3/uL (ref 0.1–1.0)
Monocytes Relative: 7 % (ref 3.0–12.0)
Neutro Abs: 4.9 10*3/uL (ref 1.4–7.7)
Neutrophils Relative %: 67.1 % (ref 43.0–77.0)
Platelets: 255 10*3/uL (ref 150.0–400.0)
RBC: 4.81 Mil/uL (ref 3.87–5.11)
RDW: 13.2 % (ref 11.5–15.5)
WBC: 7.3 10*3/uL (ref 4.0–10.5)

## 2021-10-15 LAB — COMPREHENSIVE METABOLIC PANEL
ALT: 15 U/L (ref 0–35)
AST: 16 U/L (ref 0–37)
Albumin: 4.2 g/dL (ref 3.5–5.2)
Alkaline Phosphatase: 68 U/L (ref 39–117)
BUN: 8 mg/dL (ref 6–23)
CO2: 33 mEq/L — ABNORMAL HIGH (ref 19–32)
Calcium: 9.6 mg/dL (ref 8.4–10.5)
Chloride: 100 mEq/L (ref 96–112)
Creatinine, Ser: 0.76 mg/dL (ref 0.40–1.20)
GFR: 105.32 mL/min (ref >60.00)
Glucose, Bld: 85 mg/dL (ref 70–99)
Potassium: 4.7 mEq/L (ref 3.5–5.1)
Sodium: 137 mEq/L (ref 135–145)
Total Bilirubin: 0.4 mg/dL (ref 0.2–1.2)
Total Protein: 6.9 g/dL (ref 6.0–8.3)

## 2021-10-15 LAB — TSH: TSH: 2 u[IU]/mL (ref 0.35–5.50)

## 2021-10-15 LAB — VITAMIN D 25 HYDROXY (VIT D DEFICIENCY, FRACTURES): VITD: 19.84 ng/mL — ABNORMAL LOW (ref 30.00–100.00)

## 2021-10-15 LAB — HEMOGLOBIN A1C: Hgb A1c MFr Bld: 5 % (ref 4.6–6.5)

## 2021-10-15 MED ORDER — SERTRALINE HCL 50 MG PO TABS: 50.0000 mg | ORAL_TABLET | Freq: Every day | ORAL | 3 refills | Status: DC

## 2021-10-15 NOTE — Progress Notes (Signed)
Subjective:    Patient ID: Isabel [name], female    DOB: [birth year], 30 y.o.   MRN: [id number]  CC: Isabel [name] is a 30 y.o. female who presents today for follow up.   HPI: Follow-up anxiety. She continues to have anxiety attacks.  She has no plans of pregnancy at this time.   She is more concerned with treating anxiety.  She is no longer Wellbutrin 150 mg and doesn't feel she misses medication . she started on BuSpar which has been helpful during the day.  She reports more anxious at night her husband is leaving for work ( nightshift) and this has been particularly stressful for her.  She is scared to be at home alone and she will then take her children to stay with her parents.  She does not want to do this indefinitely.  She confides in her sister.  She has been on xanax in the past with relief She tried hydroxyzine was too strong for her.  No depression.  No si/hi   Migraines are at baseline and not increased in frequency. She feels controlled on nortriptyline.  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 Negative for ayptia or malignancy   BREAST BIOPSY Left 09/10/2021   stereo bx "coil" clip-path pending   CHOLECYSTECTOMY     Family History  Problem Relation Age of Onset   Breast cancer Mother 56       currently 83   Hypertension Father    Arthritis Other    Diabetes Other    Cancer Other    Diabetes Maternal Grandmother    Diabetes Maternal Grandfather    Diabetes  Paternal Grandmother    Diabetes Paternal Grandfather    Breast cancer Maternal Aunt 29       currently 68; PALB2 mutation   Colon cancer Neg Hx    Heart disease Neg Hx     Allergies: Amoxicillin and Penicillins Current Outpatient Medications on File Prior to Visit  Medication Sig Dispense Refill   busPIRone (BUSPAR) 5 MG tablet TAKE 1 TABLET BY MOUTH THREE TIMES A DAY 60 tablet 1   gabapentin (NEURONTIN) 100 MG capsule Take 2 capsules (200 mg total) by mouth 2 (two) times daily. 120 capsule 0   nortriptyline (PAMELOR) 10 MG capsule Take 56m by mouth at bedtime. 30 capsule 5   rizatriptan (MAXALT) 10 MG tablet Take 1 tablet earliest onset of migraine.  May repeat in 2 hours if needed.  Maximum 2 tablets in 24 hours 10 tablet 5   [DISCONTINUED] famotidine (PEPCID) 20 MG tablet Take 1 tablet (20 mg total) by mouth 2 (two) times daily. 60 tablet 0   No current facility-administered medications on file prior to visit.    Social History   Tobacco Use   Smoking status: Every Day    Packs/day: 0.50    Types: Cigarettes   Smokeless tobacco: Never  Vaping Use   Vaping Use: Former  Substance Use Topics  Alcohol use: No   Drug use: Not Currently    Types: Marijuana    Review of Systems  Constitutional:  Negative for chills and fever.  Respiratory:  Negative for cough.   Cardiovascular:  Negative for chest pain and palpitations.  Gastrointestinal:  Negative for nausea and vomiting.  Psychiatric/Behavioral:  Positive for sleep disturbance. The patient is nervous/anxious.      Objective:    BP 98/70 (BP Location: Left Arm, Patient Position: Sitting, Cuff Size: Normal)   Pulse 95   Temp (!) 96.1 F (35.6 C) (Temporal)   Ht 5' 4" (1.626 m)   Wt 174 lb 9.6 oz (79.2 kg)   SpO2 99%   BMI 29.97 kg/m  BP Readings from Last 3 Encounters:  10/15/21 98/70  08/05/21 102/74  07/10/21 112/75   Wt Readings from Last 3 Encounters:  10/15/21 174 lb 9.6 oz (79.2 kg)  08/05/21 169 lb  (76.7 kg)  07/10/21 170 lb 8 oz (77.3 kg)    Physical Exam Vitals reviewed.  Constitutional:      Appearance: She is well-developed.  Eyes:     Conjunctiva/sclera: Conjunctivae normal.  Cardiovascular:     Rate and Rhythm: Normal rate and regular rhythm.     Pulses: Normal pulses.     Heart sounds: Normal heart sounds.  Pulmonary:     Effort: Pulmonary effort is normal.     Breath sounds: Normal breath sounds. No wheezing, rhonchi or rales.  Skin:    General: Skin is warm and dry.  Neurological:     Mental Status: She is alert.  Psychiatric:        Speech: Speech normal.        Behavior: Behavior normal.        Thought Content: Thought content normal.       Assessment & Plan:   Problem List Items Addressed This Visit       Other   Anxiety - Primary    Uncontrolled.  Slight improvement with BuSpar 10 mg 3 times daily, will continue. discussed starting SSRI.  We agreed Zoloft 50 mg.  She politely declines counseling at this time.  Close follow-up      Relevant Medications   sertraline (ZOLOFT) 50 MG tablet   Other Relevant Orders   Comprehensive metabolic panel   CBC with Differential/Platelet   Hemoglobin A1c   TSH   VITAMIN D 25 Hydroxy (Vit-D Deficiency, Fractures)     I have discontinued Tanishka M. Janco's Aimovig. I am also having her start on sertraline. Additionally, I am having her maintain her rizatriptan, gabapentin, nortriptyline, and busPIRone.   Meds ordered this encounter  Medications   sertraline (ZOLOFT) 50 MG tablet    Sig: Take 1 tablet (50 mg total) by mouth at bedtime.    Dispense:  90 tablet    Refill:  3    Order Specific Question:   Supervising Provider    Answer:   Crecencio Mc [[id number]]    Return precautions given.   Risks, benefits, and alternatives of the medications and treatment plan prescribed today were discussed, and patient expressed understanding.   Education regarding symptom management and diagnosis given to  patient on AVS.  Continue to follow with Flinchum, Kelby Aline, FNP for routine health maintenance.   Andrez Grime and I agreed with plan.   Mable Paris, FNP

## 2021-10-15 NOTE — Patient Instructions (Addendum)
Start Zoloft.  Continue BuSpar 5 mg 3 times a day. Please ensure that our office call to schedule MRI of your breast due in April 2023.  Please call our office if you not hear from [place] in this regard.  Generalized Anxiety Disorder, Adult Generalized anxiety disorder (GAD) is a mental health condition. Unlike normal worries, anxiety related to GAD is not triggered by a specific event. These worries do not fade or get better with time. GAD interferes with relationships, work, and school. GAD symptoms can vary from mild to severe. People with severe GAD can have intense waves of anxiety with physical symptoms that are similar to panic attacks. What are the causes? The exact cause of GAD is not known, but the following are believed to have an impact: Differences in natural brain chemicals. Genes passed down from parents to children. Differences in the way threats are perceived. Development and stress during childhood. Personality. What increases the risk? The following factors may make you more likely to develop this condition: Being female. Having a family history of anxiety disorders. Being very shy. Experiencing very stressful life events, such as the death of a loved one. Having a very stressful family environment. What are the signs or symptoms? People with GAD often worry excessively about many things in their lives, such as their health and family. Symptoms may also include: Mental and emotional symptoms: Worrying excessively about natural disasters. Fear of being late. Difficulty concentrating. Fears that others are judging your performance. Physical symptoms: Fatigue. Headaches, muscle tension, muscle twitches, trembling, or feeling shaky. Feeling like your heart is pounding or beating very fast. Feeling out of breath or like you cannot take a deep breath. Having trouble falling asleep or staying asleep, or experiencing restlessness. Sweating. Nausea, diarrhea, or irritable  bowel syndrome (IBS). Behavioral symptoms: Experiencing erratic moods or irritability. Avoidance of new situations. Avoidance of people. Extreme difficulty making decisions. How is this diagnosed? This condition is diagnosed based on your symptoms and medical history. You will also have a physical exam. Your health care provider may perform tests to rule out other possible causes of your symptoms. To be diagnosed with GAD, a person must have anxiety that: Is out of his or her control. Affects several different aspects of his or her life, such as work and relationships. Causes distress that makes him or her unable to take part in normal activities. Includes at least three symptoms of GAD, such as restlessness, fatigue, trouble concentrating, irritability, muscle tension, or sleep problems. Before your health care provider can confirm a diagnosis of GAD, these symptoms must be present more days than they are not, and they must last for 6 months or longer. How is this treated? This condition may be treated with: Medicine. Antidepressant medicine is usually prescribed for long-term daily control. Anti-anxiety medicines may be added in severe cases, especially when panic attacks occur. Talk therapy (psychotherapy). Certain types of talk therapy can be helpful in treating GAD by providing support, education, and guidance. Options include: Cognitive behavioral therapy (CBT). People learn coping skills and self-calming techniques to ease their physical symptoms. They learn to identify unrealistic thoughts and behaviors and to replace them with more appropriate thoughts and behaviors. Acceptance and commitment therapy (ACT). This treatment teaches people how to be mindful as a way to cope with unwanted thoughts and feelings. Biofeedback. This process trains you to manage your body's response (physiological response) through breathing techniques and relaxation methods. You will work with a Transport planner while  machines  are used to monitor your physical symptoms. Stress management techniques. These include yoga, meditation, and exercise. A mental health specialist can help determine which treatment is best for you. Some people see improvement with one type of therapy. However, other people require a combination of therapies. Follow these instructions at home: Lifestyle Maintain a consistent routine and schedule. Anticipate stressful situations. Create a plan and allow extra time to work with your plan. Practice stress management or self-calming techniques that you have learned from your therapist or your health care provider. Exercise regularly and spend time outdoors. Eat a healthy diet that includes plenty of vegetables, fruits, whole grains, low-fat dairy products, and lean protein. Do not eat a lot of foods that are high in fat, added sugar, or salt (sodium). Drink plenty of water. Avoid alcohol. Alcohol can increase anxiety. Avoid caffeine and certain over-the-counter cold medicines. These may make you feel worse. Ask your pharmacist which medicines to avoid. General instructions Take over-the-counter and prescription medicines only as told by your health care provider. Understand that you are likely to have setbacks. Accept this and be kind to yourself as you persist to take better care of yourself. Anticipate stressful situations. Create a plan and allow extra time to work with your plan. Recognize and accept your accomplishments, even if you judge them as small. Spend time with people who care about you. Keep all follow-up visits. This is important. Where to find more information [place]: https://carter.com/ Substance Abuse and Mental Health Services: ktimeonline.com Contact a health care provider if: Your symptoms do not get better. Your symptoms get worse. You have signs of depression, such as: A persistently sad or irritable mood. Loss of enjoyment in  activities that used to bring you joy. Change in weight or eating. Changes in sleeping habits. Get help right away if: You have thoughts about hurting yourself or others. If you ever feel like you may hurt yourself or others, or have thoughts about taking your own life, get help right away. Go to your nearest emergency department or: Call your local emergency services (911 in the [place] Call a suicide crisis helpline, such as the [place] at 918-391-3962 or 988 in the [place]. This is open 24 hours a day in the [place] Text the Crisis Text Line at (870)164-4659 (in the [place] Summary Generalized anxiety disorder (GAD) is a mental health condition that involves worry that is not triggered by a specific event. People with GAD often worry excessively about many things in their lives, such as their health and family. GAD may cause symptoms such as restlessness, trouble concentrating, sleep problems, frequent sweating, nausea, diarrhea, headaches, and trembling or muscle twitching. A mental health specialist can help determine which treatment is best for you. Some people see improvement with one type of therapy. However, other people require a combination of therapies. This information is not intended to replace advice given to you by your health care provider. Make sure you discuss any questions you have with your health care provider. Document Revised: 05/21/2021 Document Reviewed: 02/16/2021 Elsevier Patient Education  [place].

## 2021-10-15 NOTE — Assessment & Plan Note (Signed)
Uncontrolled.  Slight improvement with BuSpar 10 mg 3 times daily, will continue. discussed starting SSRI.  We agreed Zoloft 50 mg.  She politely declines counseling at this time.  Close follow-up

## 2021-10-21 ENCOUNTER — Encounter: Payer: Self-pay | Admitting: Family

## 2021-10-21 ENCOUNTER — Other Ambulatory Visit: Payer: Self-pay | Admitting: Adult Health

## 2021-10-21 DIAGNOSIS — F32A Depression, unspecified: Secondary | ICD-10-CM

## 2021-10-21 NOTE — Progress Notes (Signed)
Orders Placed This Encounter  Procedures   Ambulatory referral to Psychiatry    Orders Placed This Encounter  Procedures   Ambulatory referral to Psychiatry    Referral Priority:   Urgent    Referral Type:   Psychiatric    Referral Reason:   Specialty Services Required    Referred to Provider:   Gonzella Lex, MD    Requested Specialty:   Psychiatry    Number of Visits Requested:   1

## 2021-11-27 ENCOUNTER — Ambulatory Visit (INDEPENDENT_AMBULATORY_CARE_PROVIDER_SITE_OTHER): Payer: PRIVATE HEALTH INSURANCE | Admitting: Adult Health

## 2021-11-27 ENCOUNTER — Encounter: Payer: Self-pay | Admitting: Adult Health

## 2021-11-27 ENCOUNTER — Other Ambulatory Visit: Payer: Self-pay

## 2021-11-27 VITALS — BP 112/70 | HR 108 | Temp 96.4°F | Ht 64.0 in | Wt 180.0 lb

## 2021-11-27 DIAGNOSIS — Z6834 Body mass index (BMI) 34.0-34.9, adult: Secondary | ICD-10-CM | POA: Insufficient documentation

## 2021-11-27 DIAGNOSIS — Z6830 Body mass index (BMI) 30.0-30.9, adult: Secondary | ICD-10-CM | POA: Insufficient documentation

## 2021-11-27 DIAGNOSIS — F419 Anxiety disorder, unspecified: Secondary | ICD-10-CM

## 2021-11-27 MED ORDER — BUSPIRONE HCL 5 MG PO TABS: 5.0000 mg | ORAL_TABLET | Freq: Three times a day (TID) | ORAL | 1 refills | Status: DC

## 2021-11-27 NOTE — Progress Notes (Signed)
Established Patient Office Visit  Subjective:  Patient ID: Isabel [name], female    DOB: 1991-07-20  Age: 31 y.o. MRN: [id number]  CC:  Chief Complaint  Patient presents with   Follow-up    Anxiety managed okay on buspar but has not been taking Zoloft due to causing her to be nauseated.     HPI STORY CONTI presents for anxiety follow up. She is currently taking Buspar 5 mg po TID since 10/23/21. She stopped Zoloft due to nausea and drunk like feeling.   She was able to now sleep at her home and not going to her moms to sleep.  Denies any suicidal thoughts or homicidal thoughts.   Wellbutrin was also prescribed on 11/09/2021 by Joycelyn Schmid FNP here in office - she picked up but is not taking.   She also has xanax  0.5 mg from her OBGYN last filled 10/13/21 Dr. Marcelline Mates.She has it if needed.    Patient  denies any fever, body aches,chills, rash, chest pain, shortness of breath, nausea, vomiting, or diarrhea.  Denies dizziness, lightheadedness, pre syncopal or syncopal episodes.    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 Negative for ayptia or malignancy   BREAST BIOPSY Left 09/10/2021   stereo bx "coil" clip-path pending   CHOLECYSTECTOMY      Family History  Problem Relation Age of Onset   Breast cancer Mother 81       currently 33   Hypertension Father    Arthritis Other    Diabetes Other    Cancer Other    Diabetes Maternal Grandmother    Diabetes Maternal Grandfather     Diabetes Paternal Grandmother    Diabetes Paternal Grandfather    Breast cancer Maternal Aunt 68       currently 53; PALB2 mutation   Colon cancer Neg Hx    Heart disease Neg Hx     Social History   Socioeconomic History   Marital status: Single    Spouse name: Not on file   Number of children: Not on file   Years of education: 12   Highest education level: Not on file  Occupational History   Not on file  Tobacco Use   Smoking status: Every Day    Packs/day: 0.50    Types: Cigarettes   Smokeless tobacco: Never  Vaping Use   Vaping Use: Former  Substance and Sexual Activity   Alcohol use: No   Drug use: Not Currently    Types: Marijuana   Sexual activity: Yes    Birth control/protection: None, Condom, I.U.D.    Comment: Husband plans vasectomy   Other Topics Concern   Not on file  Social History Narrative   Right handed   Lives alone one story   Kids 55 ( boy) , 58 ( girl) , 5 ( boy)   Social Determinants of Radio broadcast assistant Strain: Not on file  Food Insecurity: Not on file  Transportation Needs: Not  on file  Physical Activity: Not on file  Stress: Not on file  Social Connections: Not on file  Intimate Partner Violence: Not on file    Outpatient Medications Prior to Visit  Medication Sig Dispense Refill   gabapentin (NEURONTIN) 100 MG capsule Take 2 capsules (200 mg total) by mouth 2 (two) times daily. 120 capsule 0   nortriptyline (PAMELOR) 10 MG capsule Take 2m by mouth at bedtime. 30 capsule 5   rizatriptan (MAXALT) 10 MG tablet Take 1 tablet earliest onset of migraine.  May repeat in 2 hours if needed.  Maximum 2 tablets in 24 hours 10 tablet 5   busPIRone (BUSPAR) 5 MG tablet TAKE 1 TABLET BY MOUTH THREE TIMES A DAY 60 tablet 1   sertraline (ZOLOFT) 50 MG tablet Take 1 tablet (50 mg total) by mouth at bedtime. 90 tablet 3   No facility-administered medications prior to visit.    Allergies  Allergen Reactions   Amoxicillin Hives    Penicillins Hives    ROS Review of Systems  Constitutional: Negative.   HENT: Negative.    Respiratory: Negative.    Cardiovascular: Negative.   Gastrointestinal: Negative.   Skin: Negative.   Psychiatric/Behavioral:  Negative for agitation, behavioral problems, confusion, decreased concentration, dysphoric mood, hallucinations, self-injury, sleep disturbance and suicidal ideas. The patient is nervous/anxious. The patient is not hyperactive.      Objective:    Physical Exam  General: Appearance:    Mildly obese female in no acute distress  Eyes:    PERRL, conjunctiva/corneas clear, EOM's intact       Lungs:     Clear to auscultation bilaterally, respirations unlabored  Heart:    Tachycardic. Normal rhythm. No murmurs, rubs, or gallops.   MS:   All extremities are intact.    Neurologic:   Awake, alert, oriented x 3. No apparent focal neurological           defect.     BP 112/70 (BP Location: Left Arm, Patient Position: Sitting, Cuff Size: Normal)    Pulse (!) 108    Temp (!) 96.4 F (35.8 C) (Oral)    Ht 5' 4" (1.626 m)    Wt 180 lb (81.6 kg)    SpO2 98%    BMI 30.90 kg/m  Wt Readings from Last 3 Encounters:  11/27/21 180 lb (81.6 kg)  10/15/21 174 lb 9.6 oz (79.2 kg)  08/05/21 169 lb (76.7 kg)     Health Maintenance Due  Topic Date Due   Pneumococcal Vaccine 131690Years old (1 - PCV) Never done   Hepatitis C Screening  Never done    There are no preventive care reminders to display for this patient.  Lab Results  Component Value Date   TSH 2.00 10/15/2021   Lab Results  Component Value Date   WBC 7.3 10/15/2021   HGB 15.1 (H) 10/15/2021   HCT 44.7 10/15/2021   MCV 92.9 10/15/2021   PLT 255.0 10/15/2021   Lab Results  Component Value Date   NA 137 10/15/2021   K 4.7 10/15/2021   CO2 33 (H) 10/15/2021   GLUCOSE 85 10/15/2021   BUN 8 10/15/2021   CREATININE 0.76 10/15/2021   BILITOT 0.4 10/15/2021   ALKPHOS 68 10/15/2021   AST 16 10/15/2021   ALT 15  10/15/2021   PROT 6.9 10/15/2021   ALBUMIN 4.2 10/15/2021   CALCIUM 9.6 10/15/2021   ANIONGAP 12 02/26/2020   GFR 105.32 10/15/2021   Lab Results  Component Value Date   CHOL 209 (H) 02/26/2020   Lab Results  Component Value Date   HDL 33 (L) 02/26/2020   Lab Results  Component Value Date   LDLCALC 160 (H) 02/26/2020   Lab Results  Component Value Date   TRIG 81 02/26/2020   Lab Results  Component Value Date   CHOLHDL 6.3 02/26/2020   Lab Results  Component Value Date   HGBA1C 5.0 10/15/2021      Assessment & Plan:   Problem List Items Addressed This Visit       Other   Anxiety - Primary    1/1*/2023 updated plan : denies any suicidal or homicidal thoughts or intents. Doing well on Buspar taking 19m TID, is able to sleep at home now. Not taking Wellbutrin or Zoloft could not tolerate side effects of nausea and feeling " drink like" resolved with stopping medications. She is now able to stay at her home at night and not go to her moms to sleep.  Discussed if needed she will take 7.5 mg to 10 mg at middle of day dose.  She has xanax from Dr. CMarcelline Matesas well not taking.  Politely declined counseling or psychiatry. Seems to be doing well.  Follow treatment plan from office  if not improving or any worsening within 72 hours and also return to office or open medical facility at ANYTIME if any symptoms persist, change, or worsen or you have any further concerns or questions. Call 911 immediately for emergencies.         Relevant Medications   busPIRone (BUSPAR) 5 MG tablet   Body mass index (BMI) of 30.0-30.9 in adult    Meds ordered this encounter  Medications   busPIRone (BUSPAR) 5 MG tablet    Sig: Take 1 tablet (5 mg total) by mouth 3 (three) times daily.    Dispense:  90 tablet    Refill:  1    Red Flags discussed. The patient was given clear instructions to go to ER or return to medical center if any red flags develop, symptoms do not improve, worsen or new  problems develop. They verbalized understanding.   Follow-up: Return in about 3 months (around 02/25/2022), or if symptoms worsen or fail to improve, for at any time for any worsening symptoms, Go to Emergency room/ urgent care if worse.    MMarcille Buffy FNP

## 2021-11-27 NOTE — Patient Instructions (Signed)
Buspirone Tablets What is this medication? BUSPIRONE (byoo SPYE rone) treats anxiety. It works by balancing the levels of dopamine and serotonin in your brain, hormones that help regulate mood. This medicine may be used for other purposes; ask your health care provider or pharmacist if you have questions. COMMON BRAND NAME(S): BuSpar, Buspar Dividose What should I tell my care team before I take this medication? They need to know if you have any of these conditions: Kidney or liver disease An unusual or allergic reaction to buspirone, other medications, foods, dyes, or preservatives Pregnant or trying to get pregnant Breast-feeding How should I use this medication? Take this medication by mouth with a glass of water. Follow the directions on the prescription label. You may take this medication with or without food. To ensure that this medication always works the same way for you, you should take it either always with or always without food. Take your doses at regular intervals. Do not take your medication more often than directed. Do not stop taking except on the advice of your care team. Talk to your care team about the use of this medication in children. Special care may be needed. Overdosage: If you think you have taken too much of this medicine contact a poison control center or emergency room at once. NOTE: This medicine is only for you. Do not share this medicine with others. What if I miss a dose? If you miss a dose, take it as soon as you can. If it is almost time for your next dose, take only that dose. Do not take double or extra doses. What may interact with this medication? Do not take this medication with any of the following: Linezolid MAOIs like Carbex, Eldepryl, Marplan, Nardil, and Parnate Methylene blue Procarbazine This medication may also interact with the following: Diazepam Digoxin Diltiazem Erythromycin Grapefruit juice Haloperidol Medications for mental  depression or mood problems Medications for seizures like carbamazepine, phenobarbital and phenytoin Nefazodone Other medications for anxiety Rifampin Ritonavir Some antifungal medications like itraconazole, ketoconazole, and voriconazole Verapamil Warfarin This list may not describe all possible interactions. Give your health care provider a list of all the medicines, herbs, non-prescription drugs, or dietary supplements you use. Also tell them if you smoke, drink alcohol, or use illegal drugs. Some items may interact with your medicine. What should I watch for while using this medication? Visit your care team for regular checks on your progress. It may take 1 to 2 weeks before your anxiety gets better. You may get drowsy or dizzy. Do not drive, use machinery, or do anything that needs mental alertness until you know how this medication affects you. Do not stand or sit up quickly, especially if you are an older patient. This reduces the risk of dizzy or fainting spells. Alcohol can make you more drowsy and dizzy. Avoid alcoholic drinks. What side effects may I notice from receiving this medication? Side effects that you should report to your care team as soon as possible: Allergic reactions--skin rash, itching, hives, swelling of the face, lips, tongue, or throat Irritability, confusion, fast or irregular heartbeat, muscle stiffness, twitching muscles, sweating, high fever, seizure, chills, vomiting, diarrhea, which may be signs of serotonin syndrome Side effects that usually do not require medical attention (report to your care team if they continue or are bothersome): Anxiety or nervousness Dizziness Drowsiness Headache Nausea Trouble sleeping This list may not describe all possible side effects. Call your doctor for medical advice about side effects. You may report  side effects to FDA at 1-800-FDA-1088. Where should I keep my medication? Keep out of the reach of children. Store at room  temperature below 30 degrees C (86 degrees F). Protect from light. Keep container tightly closed. Throw away any unused medication after the expiration date. NOTE: This sheet is a summary. It may not cover all possible information. If you have questions about this medicine, talk to your doctor, pharmacist, or health care provider.  2022 Elsevier/Gold Standard (2021-01-23 00:00:00) Generalized Anxiety Disorder, Adult Generalized anxiety disorder (GAD) is a mental health condition. Unlike normal worries, anxiety related to GAD is not triggered by a specific event. These worries do not fade or get better with time. GAD interferes with relationships, work, and school. GAD symptoms can vary from mild to severe. People with severe GAD can have intense waves of anxiety with physical symptoms that are similar to panic attacks. What are the causes? The exact cause of GAD is not known, but the following are believed to have an impact: Differences in natural brain chemicals. Genes passed down from parents to children. Differences in the way threats are perceived. Development and stress during childhood. Personality. What increases the risk? The following factors may make you more likely to develop this condition: Being female. Having a family history of anxiety disorders. Being very shy. Experiencing very stressful life events, such as the death of a loved one. Having a very stressful family environment. What are the signs or symptoms? People with GAD often worry excessively about many things in their lives, such as their health and family. Symptoms may also include: Mental and emotional symptoms: Worrying excessively about natural disasters. Fear of being late. Difficulty concentrating. Fears that others are judging your performance. Physical symptoms: Fatigue. Headaches, muscle tension, muscle twitches, trembling, or feeling shaky. Feeling like your heart is pounding or beating very  fast. Feeling out of breath or like you cannot take a deep breath. Having trouble falling asleep or staying asleep, or experiencing restlessness. Sweating. Nausea, diarrhea, or irritable bowel syndrome (IBS). Behavioral symptoms: Experiencing erratic moods or irritability. Avoidance of new situations. Avoidance of people. Extreme difficulty making decisions. How is this diagnosed? This condition is diagnosed based on your symptoms and medical history. You will also have a physical exam. Your health care provider may perform tests to rule out other possible causes of your symptoms. To be diagnosed with GAD, a person must have anxiety that: Is out of his or her control. Affects several different aspects of his or her life, such as work and relationships. Causes distress that makes him or her unable to take part in normal activities. Includes at least three symptoms of GAD, such as restlessness, fatigue, trouble concentrating, irritability, muscle tension, or sleep problems. Before your health care provider can confirm a diagnosis of GAD, these symptoms must be present more days than they are not, and they must last for 6 months or longer. How is this treated? This condition may be treated with: Medicine. Antidepressant medicine is usually prescribed for long-term daily control. Anti-anxiety medicines may be added in severe cases, especially when panic attacks occur. Talk therapy (psychotherapy). Certain types of talk therapy can be helpful in treating GAD by providing support, education, and guidance. Options include: Cognitive behavioral therapy (CBT). People learn coping skills and self-calming techniques to ease their physical symptoms. They learn to identify unrealistic thoughts and behaviors and to replace them with more appropriate thoughts and behaviors. Acceptance and commitment therapy (ACT). This treatment teaches people how  to be mindful as a way to cope with unwanted thoughts and  feelings. Biofeedback. This process trains you to manage your body's response (physiological response) through breathing techniques and relaxation methods. You will work with a therapist while machines are used to monitor your physical symptoms. Stress management techniques. These include yoga, meditation, and exercise. A mental health specialist can help determine which treatment is best for you. Some people see improvement with one type of therapy. However, other people require a combination of therapies. Follow these instructions at home: Lifestyle Maintain a consistent routine and schedule. Anticipate stressful situations. Create a plan and allow extra time to work with your plan. Practice stress management or self-calming techniques that you have learned from your therapist or your health care provider. Exercise regularly and spend time outdoors. Eat a healthy diet that includes plenty of vegetables, fruits, whole grains, low-fat dairy products, and lean protein. Do not eat a lot of foods that are high in fat, added sugar, or salt (sodium). Drink plenty of water. Avoid alcohol. Alcohol can increase anxiety. Avoid caffeine and certain over-the-counter cold medicines. These may make you feel worse. Ask your pharmacist which medicines to avoid. General instructions Take over-the-counter and prescription medicines only as told by your health care provider. Understand that you are likely to have setbacks. Accept this and be kind to yourself as you persist to take better care of yourself. Anticipate stressful situations. Create a plan and allow extra time to work with your plan. Recognize and accept your accomplishments, even if you judge them as small. Spend time with people who care about you. Keep all follow-up visits. This is important. Where to find more information Jeannette: https://carter.com/ Substance Abuse and Mental Health Services: ktimeonline.com Contact a  health care provider if: Your symptoms do not get better. Your symptoms get worse. You have signs of depression, such as: A persistently sad or irritable mood. Loss of enjoyment in activities that used to bring you joy. Change in weight or eating. Changes in sleeping habits. Get help right away if: You have thoughts about hurting yourself or others. If you ever feel like you may hurt yourself or others, or have thoughts about taking your own life, get help right away. Go to your nearest emergency department or: Call your local emergency services (911 in the [place] Call a suicide crisis helpline, such as the [facility] at 437-266-7431 or 988 in the [place]. This is open 24 hours a day in the [place] Text the Crisis Text Line at 254-166-2818 (in the [place].). Summary Generalized anxiety disorder (GAD) is a mental health condition that involves worry that is not triggered by a specific event. People with GAD often worry excessively about many things in their lives, such as their health and family. GAD may cause symptoms such as restlessness, trouble concentrating, sleep problems, frequent sweating, nausea, diarrhea, headaches, and trembling or muscle twitching. A mental health specialist can help determine which treatment is best for you. Some people see improvement with one type of therapy. However, other people require a combination of therapies. This information is not intended to replace advice given to you by your health care provider. Make sure you discuss any questions you have with your health care provider. Document Revised: 05/21/2021 Document Reviewed: 02/16/2021 Elsevier Patient Education  [place].

## 2021-11-27 NOTE — Assessment & Plan Note (Addendum)
1/1*/2023 updated plan : denies any suicidal or homicidal thoughts or intents. Doing well on Buspar taking 5mg  TID, is able to sleep at home now. Not taking Wellbutrin or Zoloft could not tolerate side effects of nausea and feeling " drink like" resolved with stopping medications. She is now able to stay at her home at night and not go to her moms to sleep.  Discussed if needed she will take 7.5 mg to 10 mg at middle of day dose.  She has xanax from Dr. Marcelline Mates as well not taking.  Politely declined counseling or psychiatry. Seems to be doing well.  Follow treatment plan from office  if not improving or any worsening within 72 hours and also return to office or open medical facility at ANYTIME if any symptoms persist, change, or worsen or you have any further concerns or questions. Call 911 immediately for emergencies.

## 2021-12-10 ENCOUNTER — Other Ambulatory Visit: Payer: Self-pay | Admitting: Obstetrics and Gynecology

## 2021-12-16 ENCOUNTER — Encounter: Payer: Self-pay | Admitting: Adult Health

## 2021-12-30 ENCOUNTER — Other Ambulatory Visit: Payer: Self-pay

## 2021-12-30 DIAGNOSIS — F419 Anxiety disorder, unspecified: Secondary | ICD-10-CM

## 2021-12-30 MED ORDER — BUSPIRONE HCL 5 MG PO TABS: 5.0000 mg | ORAL_TABLET | Freq: Three times a day (TID) | ORAL | 0 refills | Status: DC

## 2022-01-04 ENCOUNTER — Other Ambulatory Visit: Payer: Self-pay | Admitting: Neurology

## 2022-01-06 ENCOUNTER — Telehealth: Payer: Self-pay | Admitting: Adult Health

## 2022-01-06 NOTE — Telephone Encounter (Signed)
Lft pt vm to call ofc to sch MRI breast. thanks

## 2022-01-12 ENCOUNTER — Encounter: Payer: Self-pay | Admitting: Obstetrics and Gynecology

## 2022-01-12 MED ORDER — ALPRAZOLAM 0.5 MG PO TABS: 0.5000 mg | ORAL_TABLET | Freq: Every evening | ORAL | 0 refills | Status: DC | PRN

## 2022-01-16 ENCOUNTER — Ambulatory Visit
Admission: RE | Admit: 2022-01-16 | Discharge: 2022-01-16 | Disposition: A | Discharge status: Home / Self Care | Payer: PRIVATE HEALTH INSURANCE | Source: Ambulatory Visit | Attending: Family | Admitting: Family

## 2022-01-16 ENCOUNTER — Other Ambulatory Visit: Payer: Self-pay

## 2022-01-16 DIAGNOSIS — Z803 Family history of malignant neoplasm of breast: Secondary | ICD-10-CM | POA: Diagnosis present

## 2022-01-16 DIAGNOSIS — Z1231 Encounter for screening mammogram for malignant neoplasm of breast: Secondary | ICD-10-CM | POA: Insufficient documentation

## 2022-01-16 IMAGING — MR MR BREAST BILAT WO/W CM
2 of 12 series · 6 of 48 positions shown · IV contrast (7.5ml Gadavist)
Comparison: [DATE] breast MR. [DATE] mammogram, ultrasound
and prior studies.

CLINICAL DATA: 30-year-old female with high lifetime risk for
developing breast cancer, for screening breast MRI.

EXAM:
BILATERAL BREAST MRI WITH AND WITHOUT CONTRAST
TECHNIQUE: Multiplanar, multisequence MR images of both breasts were obtained
prior to and following the intravenous administration of 7.5 ml of
Gadavist

[Series 2: T1 · axial · B · 1.5mm · 1.02mm/px · z∈[-61,+118]mm · 5 of 120 slices shown]
[im 1/120]
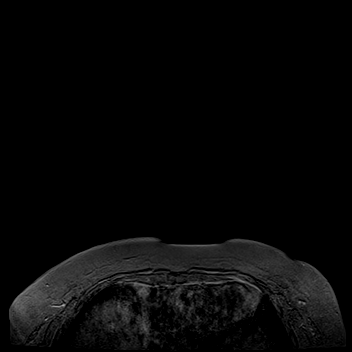
[im 30/120]
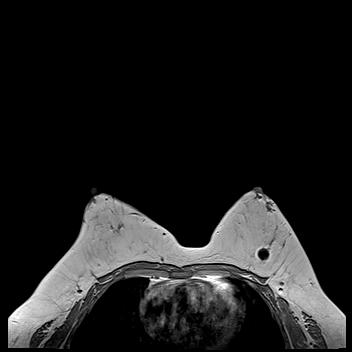
[im 60/120]
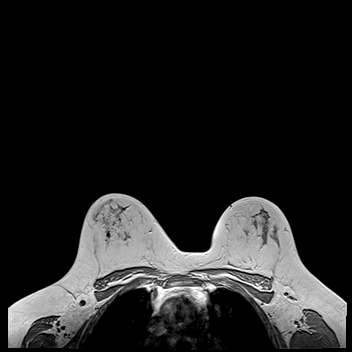
[im 90/120]
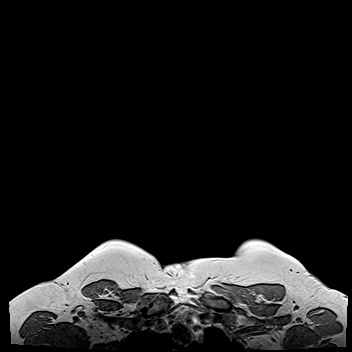
[im 120/120]
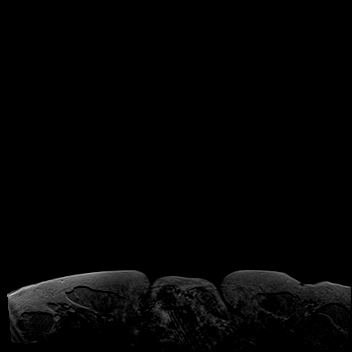

[Series 3: T2 · axial · B · 3.0mm · 1.02mm/px · 1 of 48 slices shown]
[im 1/48]
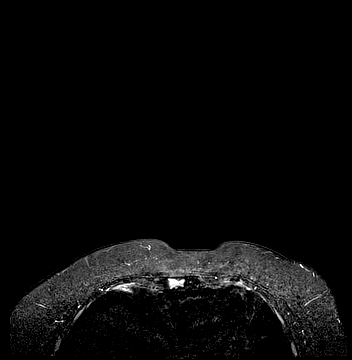

[6 of 48 positions shown; findings below may reference images not displayed]

Three-dimensional MR images were rendered by post-processing of the
original MR data on an independent workstation. The
three-dimensional MR images were interpreted, and findings are
reported in the following complete MRI report for this study. Three
dimensional images were evaluated at the independent interpreting
workstation using the DynaCAD thin client.
FINDINGS: Breast composition: b. Scattered fibroglandular tissue.

Background parenchymal enhancement: Mild

Right breast: No suspicious mass or worrisome enhancement. Biopsy
clip artifact within the UPPER RIGHT breast again noted.

Left breast: No suspicious mass or worrisome enhancement. Biopsy
clip artifact within the posterior LEFT breast noted.

Lymph nodes: No abnormal appearing lymph nodes.

Ancillary findings:  None.
IMPRESSION: No MR evidence of breast malignancy.

RECOMMENDATION:
Bilateral screening mammogram in 8 months to resume annual mammogram
schedule.

Bilateral screening breast MRI in 1 year in this high risk patient.

BI-RADS CATEGORY  1: Negative.

## 2022-01-16 MED ORDER — GADOPENTETATE DIMEGLUMINE 469.01 MG/ML IV SOLN: 7.5000 mL | Freq: Once | INTRAVENOUS | Status: DC | PRN

## 2022-01-16 MED ORDER — GADOPENTETATE DIMEGLUMINE 469.01 MG/ML IV SOLN: 8.0000 mL | Freq: Once | INTRAVENOUS | Status: DC | PRN

## 2022-01-16 MED ORDER — GADOBUTROL 1 MMOL/ML IV SOLN
7.5000 mL | Freq: Once | INTRAVENOUS | Status: AC | PRN
  Administered 2022-01-16: 7.5 mL via INTRAVENOUS

## 2022-01-20 ENCOUNTER — Other Ambulatory Visit: Payer: Self-pay | Admitting: Obstetrics and Gynecology

## 2022-01-29 ENCOUNTER — Encounter: Payer: Self-pay | Admitting: Neurology

## 2022-01-29 ENCOUNTER — Other Ambulatory Visit: Payer: Self-pay | Admitting: Obstetrics and Gynecology

## 2022-01-29 NOTE — Telephone Encounter (Signed)
Patient was typically using the medication just for procedures. I have received several refill requests lately.  Please assess if patient is having more issues with anxiety or if this is just her pharmacy sending a refill request.

## 2022-01-31 ENCOUNTER — Other Ambulatory Visit: Payer: Self-pay | Admitting: Neurology

## 2022-02-02 ENCOUNTER — Other Ambulatory Visit: Payer: Self-pay

## 2022-02-02 DIAGNOSIS — D333 Benign neoplasm of cranial nerves: Secondary | ICD-10-CM

## 2022-02-02 NOTE — Progress Notes (Signed)
We can schedule an MRI of the internal auditory canals with and without contrast now (reason - vestibular schwannoma).  She also needs to schedule a follow up appointment as well. ?

## 2022-02-06 ENCOUNTER — Other Ambulatory Visit: Payer: Self-pay | Admitting: Family

## 2022-02-06 DIAGNOSIS — H6504 Acute serous otitis media, recurrent, right ear: Secondary | ICD-10-CM

## 2022-02-06 DIAGNOSIS — F419 Anxiety disorder, unspecified: Secondary | ICD-10-CM

## 2022-02-16 ENCOUNTER — Other Ambulatory Visit: Payer: Self-pay | Admitting: Family

## 2022-02-16 DIAGNOSIS — M5441 Lumbago with sciatica, right side: Secondary | ICD-10-CM

## 2022-02-25 ENCOUNTER — Ambulatory Visit: Payer: PRIVATE HEALTH INSURANCE | Admitting: Adult Health

## 2022-02-26 NOTE — Telephone Encounter (Signed)
Patient was typically using the medication just for procedures. I have received several refill requests lately.  Please assess if patient is having more issues with anxiety or if this is just her pharmacy sending a refill request.

## 2022-02-27 ENCOUNTER — Other Ambulatory Visit: Payer: Self-pay | Admitting: Neurology

## 2022-02-27 MED ORDER — DIAZEPAM 5 MG PO TABS: ORAL_TABLET | ORAL | 0 refills | Status: DC

## 2022-02-27 NOTE — Telephone Encounter (Signed)
Sent patient a Writer. ?

## 2022-03-02 ENCOUNTER — Telehealth: Payer: Self-pay | Admitting: Neurology

## 2022-03-02 NOTE — Telephone Encounter (Signed)
Mychart message sent back to patient. ?Patient has to call the [facility] to cancel appt. Approval already done for [facility].  ?

## 2022-03-02 NOTE — Telephone Encounter (Signed)
Patient called and left a message requesting an order for MRI be sent to [facility]. ?

## 2022-03-02 NOTE — Telephone Encounter (Signed)
03/02/22~Rcvd Secure Chat msg from Megan/[facility]-MR ref schd patient for MRI to be done @ [facility] (per patient request). LVM for patient ref Auth  needed for [facility] & that she will need to notify ref MD & GI of wanting test done @ [facility] & to cncl @ GI. MF ?

## 2022-03-05 ENCOUNTER — Ambulatory Visit
Admission: RE | Admit: 2022-03-05 | Discharge: 2022-03-05 | Disposition: A | Discharge status: Home / Self Care | Payer: PRIVATE HEALTH INSURANCE | Source: Ambulatory Visit | Attending: Neurology | Admitting: Neurology

## 2022-03-05 DIAGNOSIS — D333 Benign neoplasm of cranial nerves: Secondary | ICD-10-CM

## 2022-03-05 IMAGING — MR MR BRAIN/TEMPORAL BONE/IAC
13 of 14 series · 45 of 48 positions shown · IV contrast (15 mLs Multihance)
Comparison: [DATE]

CLINICAL DATA: Vestibular schwannoma, follow-up

EXAM:
MRI HEAD WITHOUT AND WITH CONTRAST
TECHNIQUE: Multiplanar, multiecho pulse sequences of the brain and surrounding
structures were obtained without and with intravenous contrast.
CONTRAST:  15mL MULTIHANCE GADOBENATE DIMEGLUMINE 529 MG/ML IV SOLN

[Series 5: T1 · sagittal · 4.0mm · 0.72mm/px · 1 of 27 slices shown (1 of 3)]
[im 1/27]
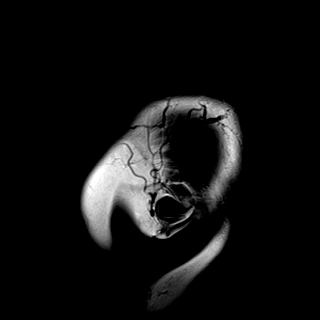

[Series 6: DWI · axial · 3.0mm · 0.94mm/px · z∈[-70,+88]mm · 11 of 180 slices shown]
[im 1/180]
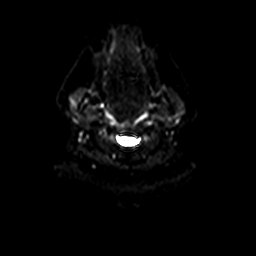
[im 18/180]
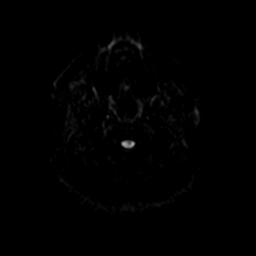
[im 36/180]
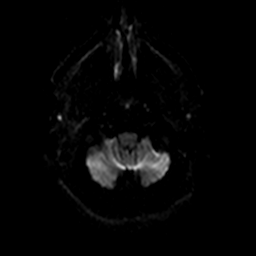
[im 54/180]
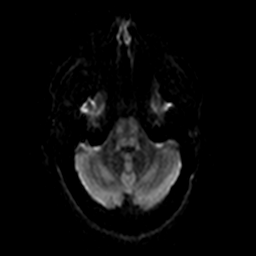
[im 72/180]
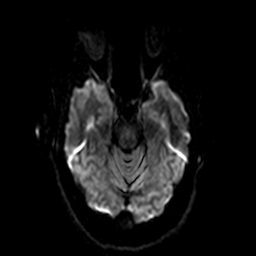
[im 90/180]
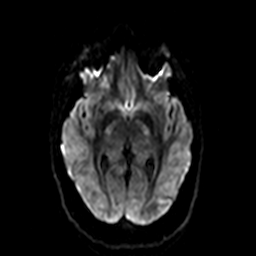
[im 108/180]
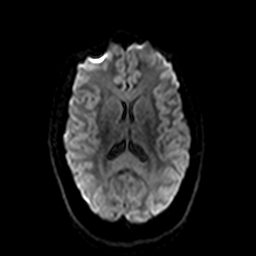
[im 126/180]
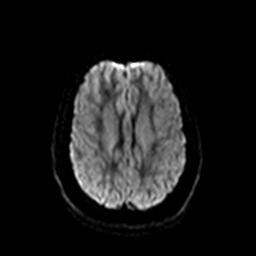
[im 144/180]
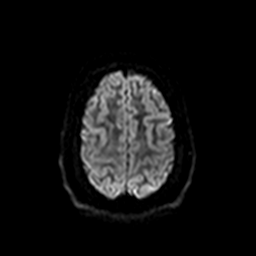
[im 162/180]
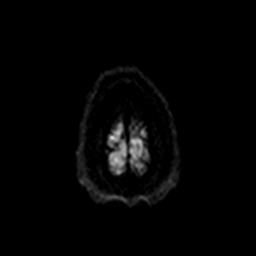
[im 180/180]
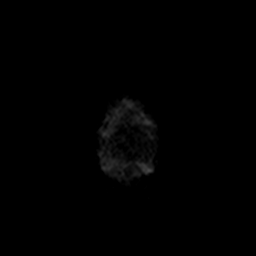

[Series 7: ax dwi_tracew · axial · 3.0mm · 0.94mm/px · z∈[-70,+88]mm · 6 of 90 slices shown]
[im 1/90]
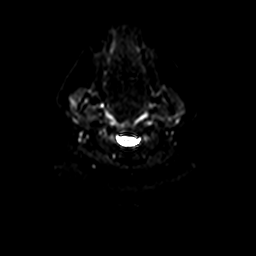
[im 18/90]
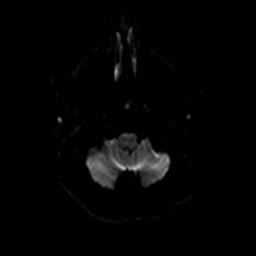
[im 36/90]
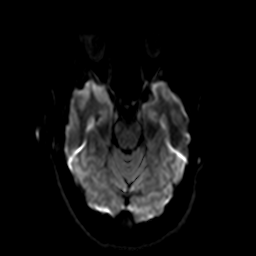
[im 54/90]
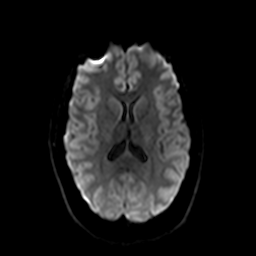
[im 72/90]
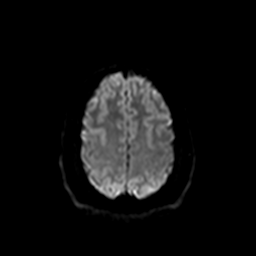
[im 90/90]
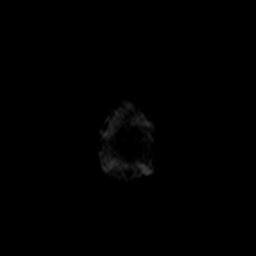

[Series 8: ax dwi_adc · axial · 3.0mm · 0.94mm/px · z∈[-70,+88]mm · 3 of 45 slices shown]
[im 1/45]
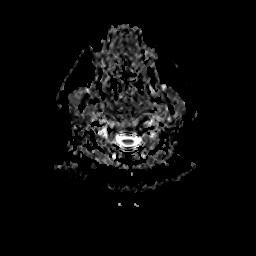
[im 23/45]
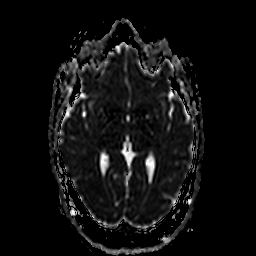
[im 45/45]
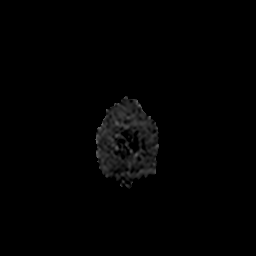

[Series 9: T2 · axial · 4.0mm · 0.36mm/px · z∈[-65,+91]mm · 2 of 31 slices shown]
[im 1/31]
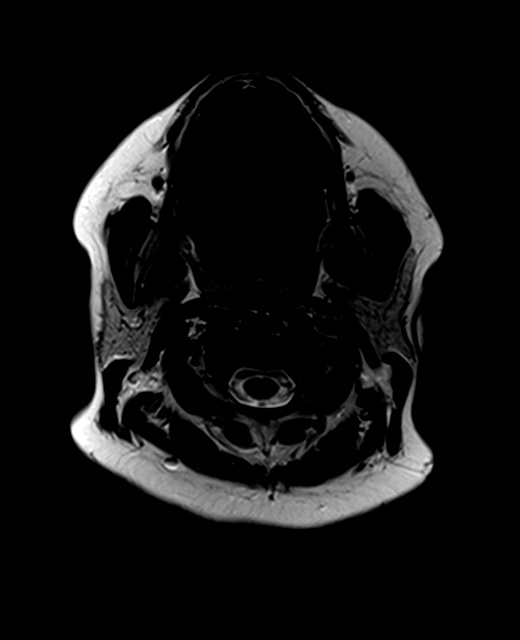
[im 31/31]
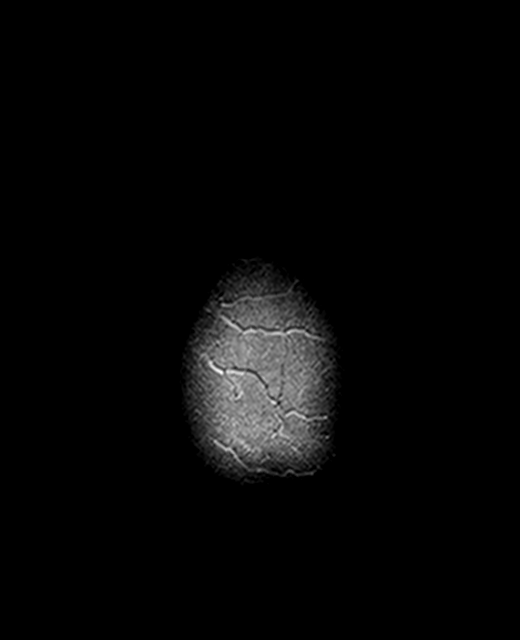

[Series 10: FLAIR · axial · 3.0mm · 0.72mm/px · z∈[-65,+91]mm · 2 of 27 slices shown]
[im 1/27]
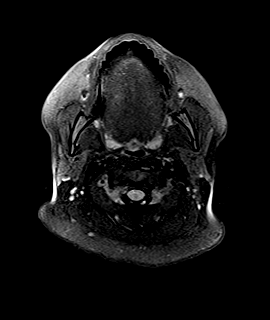
[im 27/27]
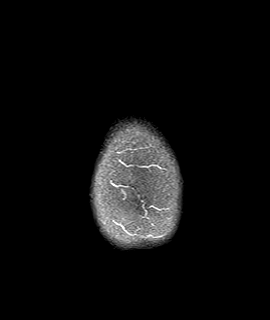

[Series 11: mip_images(sw) · axial · 24.0mm · 0.90mm/px · z∈[-53,+79]mm · 3 of 45 slices shown]
[im 1/45]
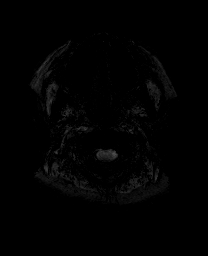
[im 23/45]
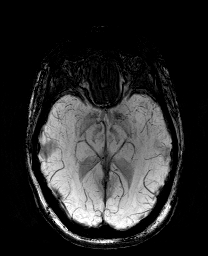
[im 45/45]
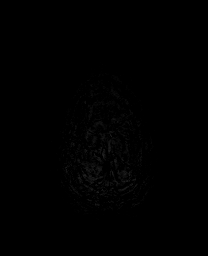

[Series 12: swi_images · axial · 3.0mm · 0.90mm/px · z∈[-64,+89]mm · 3 of 52 slices shown]
[im 1/52]
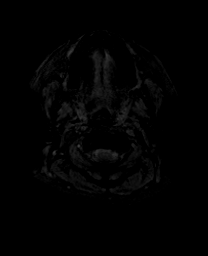
[im 26/52]
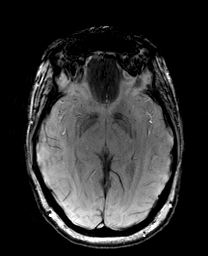
[im 52/52]
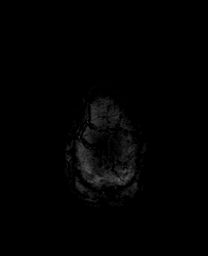

[Series 13: T1 · coronal · 3.0mm · 0.56mm/px · 1 of 13 slices shown (2 of 3)]
[im 1/13]
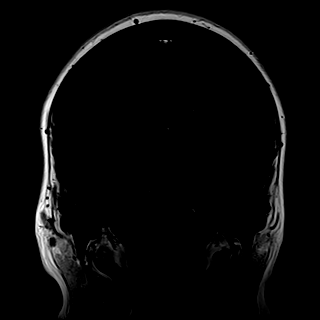

[Series 15: T1 · axial · 3.0mm · 0.50mm/px · 1 of 13 slices shown (3 of 3)]
[im 1/13]
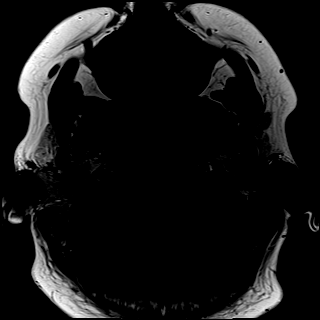

[Series 16: T1 post-contrast · coronal · 3.0mm · 0.56mm/px · 1 of 13 slices shown (1 of 3)]
[im 1/13]
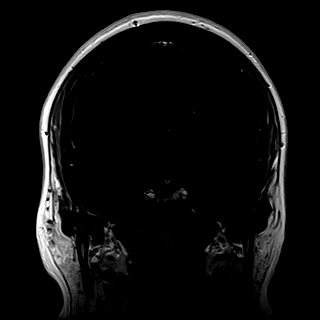

[Series 17: T1 post-contrast · axial · 3.0mm · 0.50mm/px · 1 of 13 slices shown (2 of 3)]
[im 1/13]
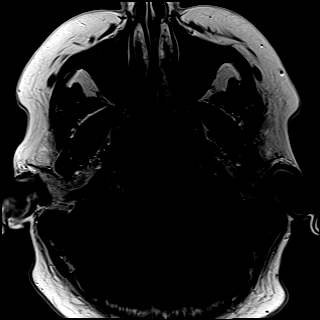

[Series 18: T1 post-contrast · axial · 1.0mm · 0.90mm/px · z∈[-66,+92]mm · 10 of 160 slices shown (3 of 3)]
[im 1/160]
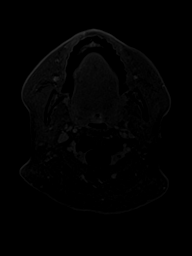
[im 18/160]
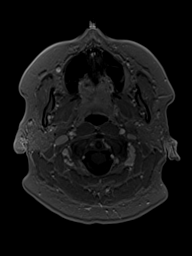
[im 36/160]
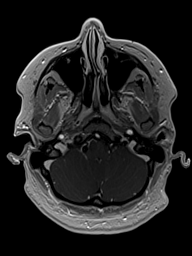
[im 54/160]
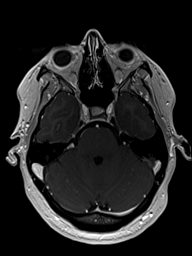
[im 71/160]
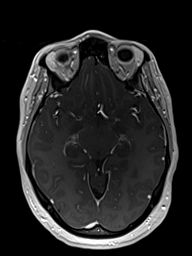
[im 89/160]
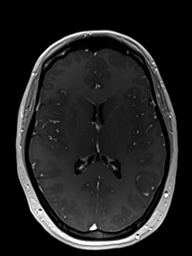
[im 107/160]
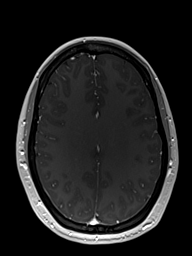
[im 124/160]
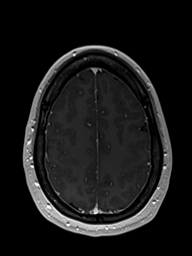
[im 142/160]
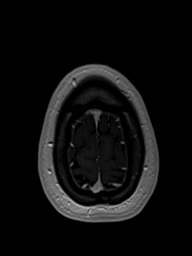
[im 160/160]
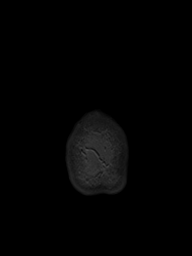

[45 of 48 positions shown; findings below may reference images not displayed]

FINDINGS: Brain: There is no cerebellopontine angle mass. Inner ear structures
demonstrate an unremarkable MR appearance. There is no abnormal
enhancement within the internal auditory canals. On high-resolution
imaging, there is no convincing nodular tissue at the fundus of the
left internal auditory canal where enhancement was seen previously.

No acute infarction or intracranial hemorrhage. There is no mass
effect or edema. There is no extra-axial fluid collection.
Ventricles and sulci are normal in size and configuration.

Vascular: Major vessel flow voids at the skull base are preserved.

Skull and upper cervical spine: Normal marrow signal is preserved.

Sinuses/Orbits: Paranasal sinuses are clear. The orbits are
unremarkable.

Other: The sella is partially empty.  Mastoid air cells are clear.
IMPRESSION: No evidence of vestibular schwannoma. Enhancement seen previously
was likely vascular.

No new abnormality. Unchanged appearance of nonspecific and commonly
incidental partially empty sella.

## 2022-03-05 MED ORDER — GADOBENATE DIMEGLUMINE 529 MG/ML IV SOLN
15.0000 mL | Freq: Once | INTRAVENOUS | Status: AC | PRN
  Administered 2022-03-05: 15 mL via INTRAVENOUS

## 2022-03-16 ENCOUNTER — Encounter: Payer: Self-pay | Admitting: Neurology

## 2022-03-31 ENCOUNTER — Telehealth (INDEPENDENT_AMBULATORY_CARE_PROVIDER_SITE_OTHER): Payer: Self-pay | Admitting: Vascular Surgery

## 2022-03-31 NOTE — Telephone Encounter (Signed)
LVM fo rpt TCB and schedule appt  ls 03/2021 with ks. le reflux + consult. right leg pain (like before per pt). veins "popping out". see gs/fb

## 2022-04-06 ENCOUNTER — Other Ambulatory Visit: Payer: Self-pay | Admitting: Neurology

## 2022-04-11 ENCOUNTER — Other Ambulatory Visit: Payer: Self-pay

## 2022-04-11 ENCOUNTER — Emergency Department: Payer: PRIVATE HEALTH INSURANCE

## 2022-04-11 DIAGNOSIS — M79604 Pain in right leg: Secondary | ICD-10-CM | POA: Insufficient documentation

## 2022-04-11 LAB — CBC WITH DIFFERENTIAL/PLATELET
Abs Immature Granulocytes: 0.03 10*3/uL (ref 0.00–0.07)
Basophils Absolute: 0.1 10*3/uL (ref 0.0–0.1)
Basophils Relative: 1 %
Eosinophils Absolute: 0.2 10*3/uL (ref 0.0–0.5)
Eosinophils Relative: 2 %
HCT: 42.3 % (ref 36.0–46.0)
Hemoglobin: 14.2 g/dL (ref 12.0–15.0)
Immature Granulocytes: 0 %
Lymphocytes Relative: 26 %
Lymphs Abs: 2.7 10*3/uL (ref 0.7–4.0)
MCH: 31.2 pg (ref 26.0–34.0)
MCHC: 33.6 g/dL (ref 30.0–36.0)
MCV: 93 fL (ref 80.0–100.0)
Monocytes Absolute: 0.6 10*3/uL (ref 0.1–1.0)
Monocytes Relative: 6 %
Neutro Abs: 6.7 10*3/uL (ref 1.7–7.7)
Neutrophils Relative %: 65 %
Platelets: 262 10*3/uL (ref 150–400)
RBC: 4.55 MIL/uL (ref 3.87–5.11)
RDW: 12.7 % (ref 11.5–15.5)
WBC: 10.2 10*3/uL (ref 4.0–10.5)
nRBC: 0 % (ref 0.0–0.2)

## 2022-04-11 LAB — COMPREHENSIVE METABOLIC PANEL
ALT: 10 U/L (ref 0–44)
AST: 14 U/L — ABNORMAL LOW (ref 15–41)
Albumin: 3.6 g/dL (ref 3.5–5.0)
Alkaline Phosphatase: 61 U/L (ref 38–126)
Anion gap: 4 — ABNORMAL LOW (ref 5–15)
BUN: 11 mg/dL (ref 6–20)
CO2: 30 mmol/L (ref 22–32)
Calcium: 8.6 mg/dL — ABNORMAL LOW (ref 8.9–10.3)
Chloride: 104 mmol/L (ref 98–111)
Creatinine, Ser: 0.9 mg/dL (ref 0.44–1.00)
GFR, Estimated: >60 mL/min (ref >60)
Glucose, Bld: 105 mg/dL — ABNORMAL HIGH (ref 70–99)
Potassium: 4.6 mmol/L (ref 3.5–5.1)
Sodium: 138 mmol/L (ref 135–145)
Total Bilirubin: 0.4 mg/dL (ref 0.3–1.2)
Total Protein: 7 g/dL (ref 6.5–8.1)

## 2022-04-11 IMAGING — US US EXTREM LOW VENOUS*R*
1 series · 14 of 24 positions shown · non-contrast
Comparison: [DATE]

CLINICAL DATA: Right lower extremity swelling

EXAM:
Right LOWER EXTREMITY VENOUS DOPPLER ULTRASOUND
TECHNIQUE: Gray-scale sonography with compression, as well as color and duplex
ultrasound, were performed to evaluate the deep venous system(s)
from the level of the common femoral vein through the popliteal and
proximal calf veins.

[Series 1: us venous img lower uni right (dvt) · portal-venous · 14 of 34 slices shown]
[im 1/34]
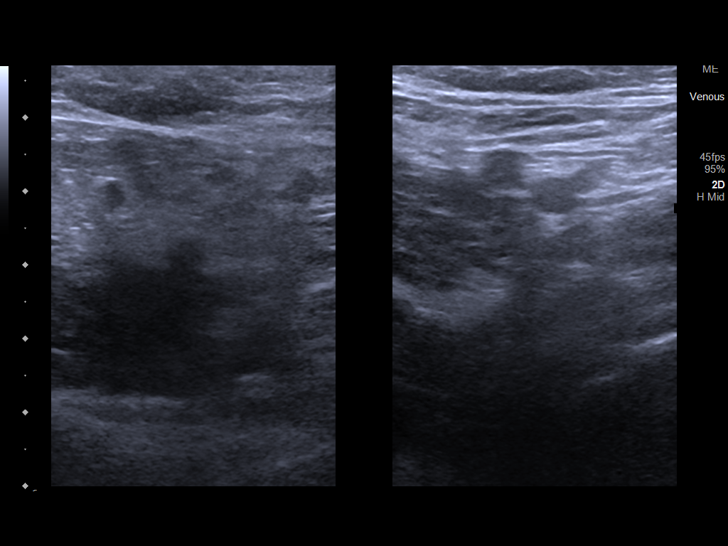
[im 3/34]
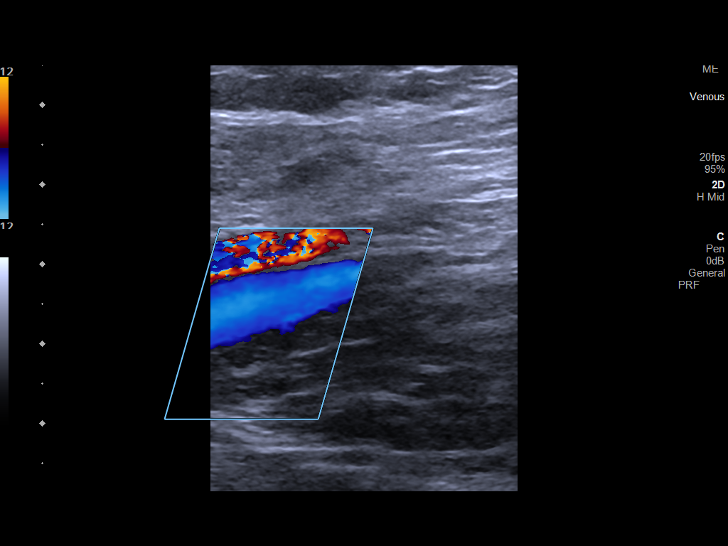
[im 6/34]
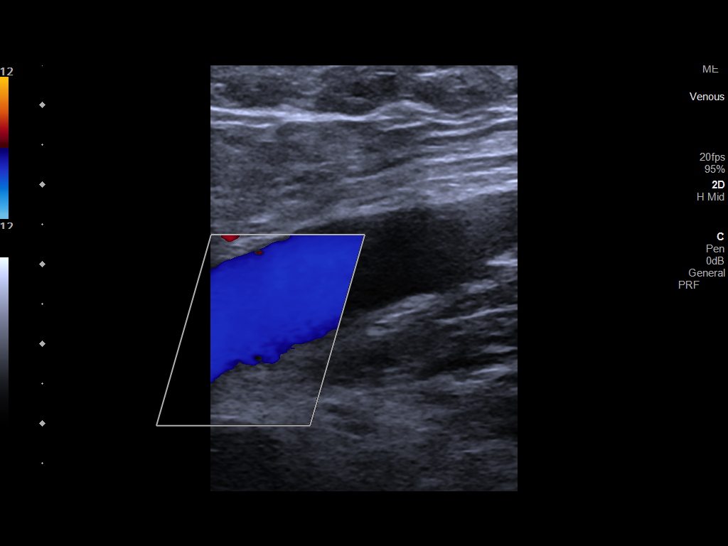
[im 9/34]
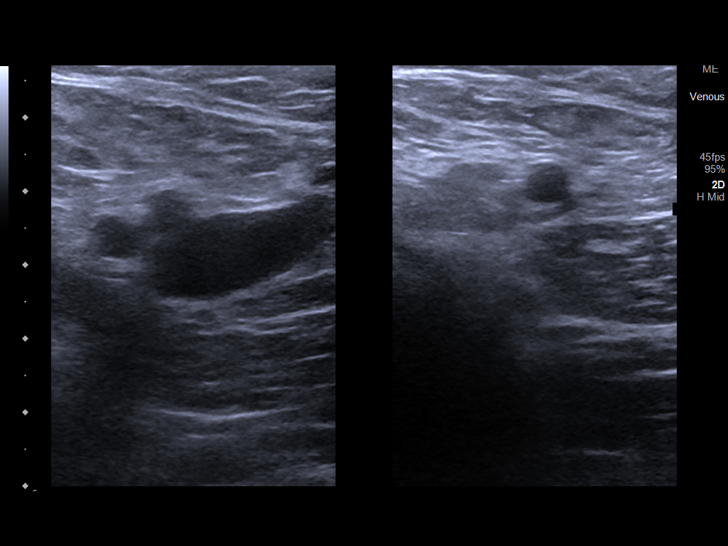
[im 11/34]
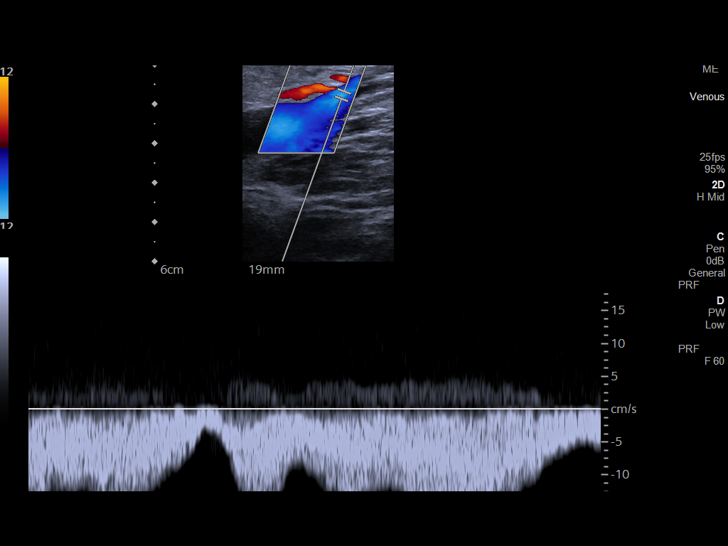
[im 13/34]
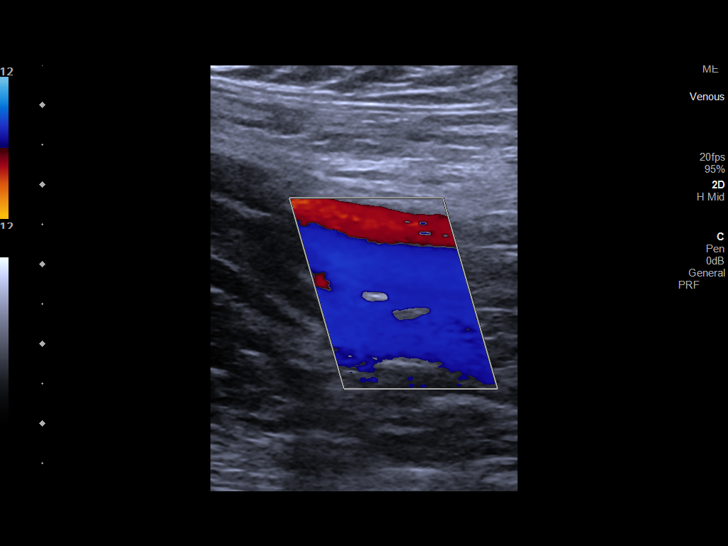
[im 16/34]
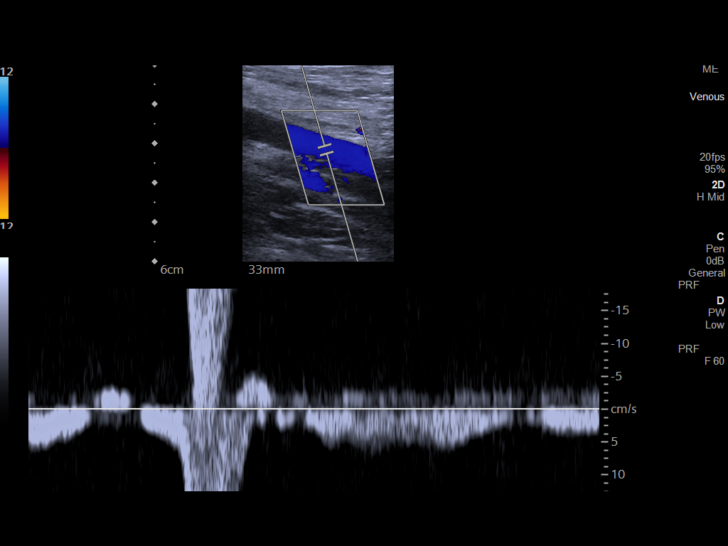
[im 18/34]
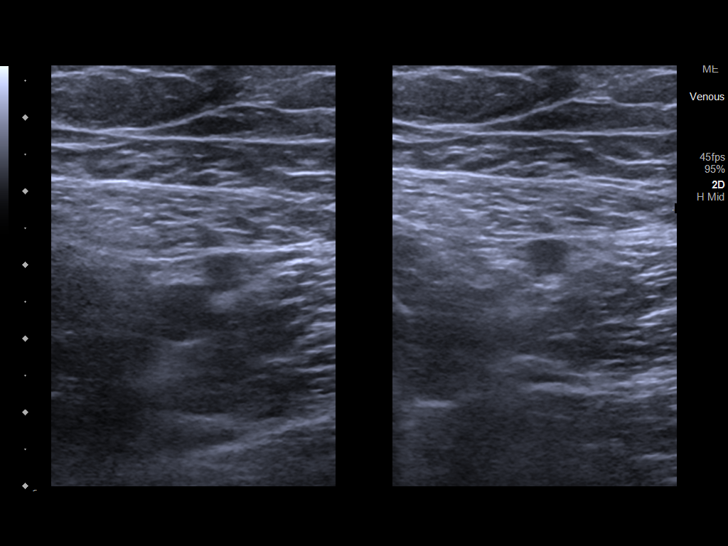
[im 21/34]
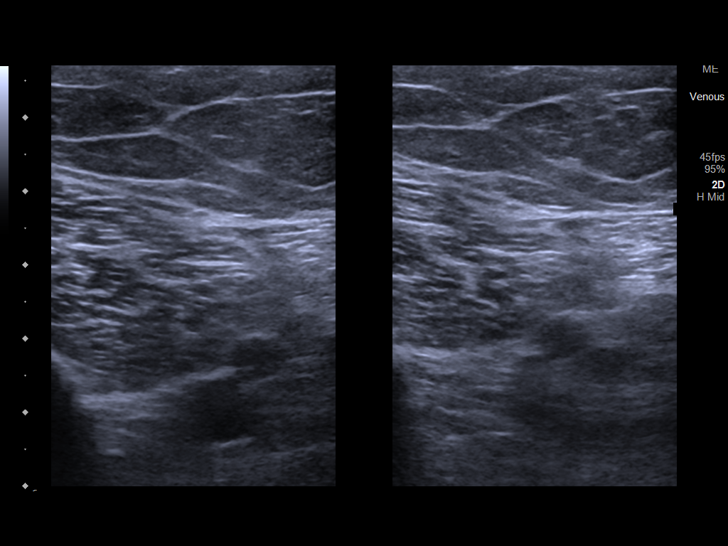
[im 23/34]
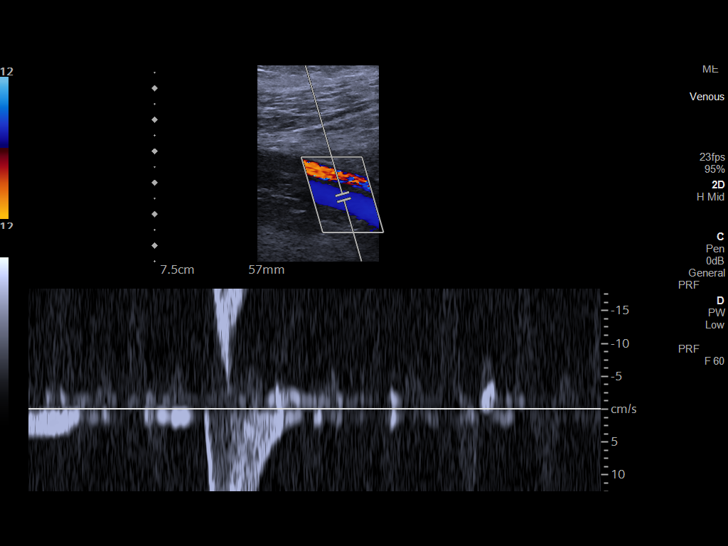
[im 26/34]
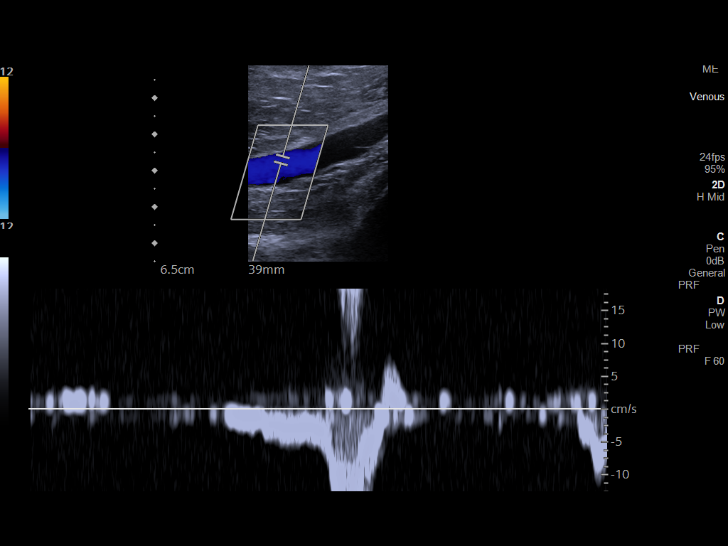
[im 28/34]
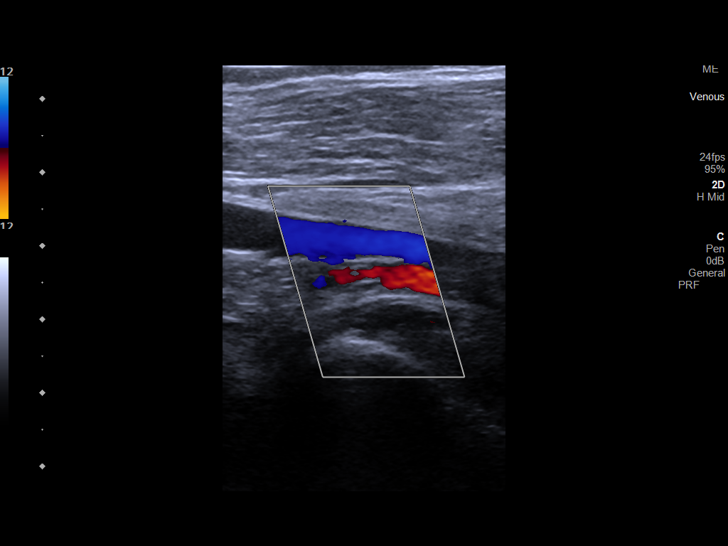
[im 31/34]
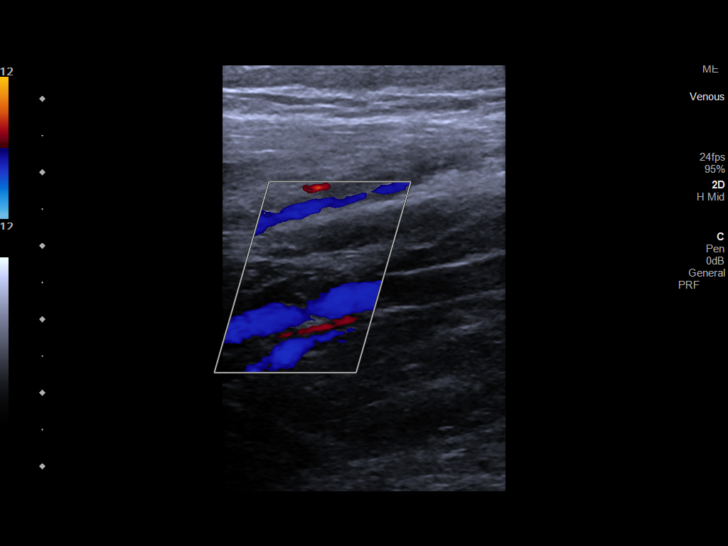
[im 34/34]
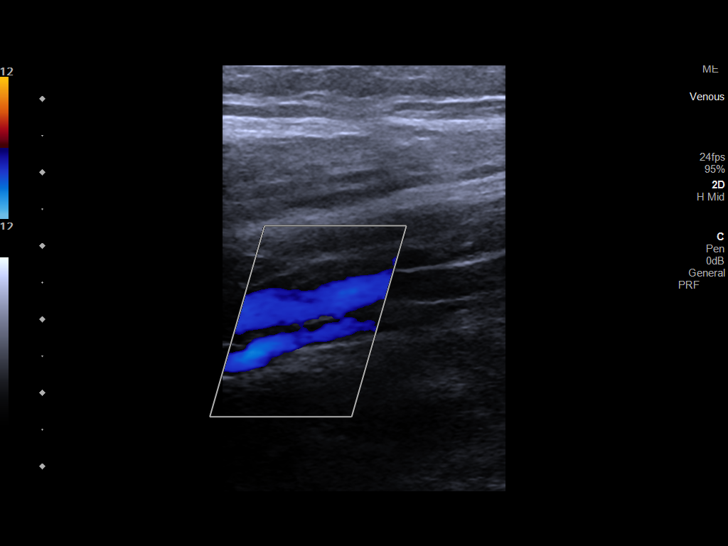

[14 of 24 positions shown; findings below may reference images not displayed]

FINDINGS: VENOUS

Normal compressibility of the common femoral, superficial femoral,
and popliteal veins, as well as the visualized calf veins.
Visualized portions of profunda femoral vein and great saphenous
vein unremarkable. No filling defects to suggest DVT on grayscale or
color Doppler imaging. Doppler waveforms show normal direction of
venous flow, normal respiratory plasticity and response to
augmentation.

Limited views of the contralateral common femoral vein are
unremarkable.

OTHER

None.

Limitations: none
IMPRESSION: Negative.

## 2022-04-11 NOTE — ED Notes (Signed)
Blue top in lab

## 2022-04-11 NOTE — ED Triage Notes (Signed)
Right leg pain x 4 weeks. Reports calm intermittently becomes hot and edematous. Hx of DVTs

## 2022-04-12 ENCOUNTER — Emergency Department
Admission: EM | Admit: 2022-04-12 | Discharge: 2022-04-12 | Disposition: A | Discharge status: Home / Self Care | Payer: PRIVATE HEALTH INSURANCE | Attending: Emergency Medicine | Admitting: Emergency Medicine

## 2022-04-12 DIAGNOSIS — M79604 Pain in right leg: Secondary | ICD-10-CM

## 2022-04-12 NOTE — Discharge Instructions (Addendum)
Your workup in the Emergency Department today was reassuring.  We did not find any specific abnormalities.  We recommend you drink plenty of fluids, take your regular medications and/or any new ones prescribed today, and follow up with the doctor(s) listed in these documents as recommended.  Consider using compression stockings and keeping your leg elevated when possible.  Return to the Emergency Department if you develop new or worsening symptoms that concern you.

## 2022-04-12 NOTE — ED Provider Notes (Signed)
[facility] Provider Note    Event Date/Time   First MD Initiated Contact with Patient 04/12/22 0017     (approximate)   History   Leg Pain   HPI  Isabel ECCLESTON is a 31 y.o. female who presents for evaluation of pain in her right leg.  This has been going on for close to 2 weeks and she is concerned she may have a blood clot.  She has had issues in the right leg before, never a DVT, but she has seen Dr. Delana Meyer for some laser procedures on the superficial veins.  She has an appointment with Dr. Delana Meyer scheduled at the end of this month but wanted to make sure nothing emergent was going on right now.  She has had no swelling although sometimes her foot is swollen by the end of the day.  No numbness nor tingling although sometimes she has numbness and tingling when her legs are swollen.  No redness or rash.  No recent injury or trauma of any kind.  No recent immobilization nor travel.  No history of DVT nor PE and no history of anticoagulation.     Physical Exam   Triage Vital Signs: ED Triage Vitals  Enc Vitals Group     BP 04/11/22 2057 (!) 127/93     Pulse Rate 04/11/22 2057 (!) 104     Resp 04/11/22 2057 16     Temp 04/11/22 2057 98.7 F (37.1 C)     Temp Source 04/11/22 2057 Oral     SpO2 04/11/22 2057 100 %     Weight 04/11/22 2057 81.6 kg (180 lb)     Height 04/11/22 2057 1.626 m ('5\' 4"'$ )     Head Circumference --      Peak Flow --      Pain Score 04/11/22 2102 7     Pain Loc --      Pain Edu? --      Excl. in [place]? --     Most recent vital signs: Vitals:   04/11/22 2057 04/12/22 0036  BP: (!) 127/93 114/80  Pulse: (!) 104 98  Resp: 16 18  Temp: 98.7 F (37.1 C) 98.4 F (36.9 C)  SpO2: 100% 100%     General: Awake, no distress.  CV:  Good peripheral perfusion.  Easily palpable pulse in the patient's right foot. Resp:  Normal effort.  Abd:  No distention.  Other:  No unilateral leg swelling, both legs appear equal.  No  peripheral edema.  Compartments are soft and easily compressible.  No palpable deformities.  Normal range of motion.  Ambulatory without difficulty.  No rash or evidence of cellulitis.   ED Results / Procedures / Treatments   Labs (all labs ordered are listed, but only abnormal results are displayed) Labs Reviewed  COMPREHENSIVE METABOLIC PANEL - Abnormal; Notable for the following components:      Result Value   Glucose, Bld 105 (*)    Calcium 8.6 (*)    AST 14 (*)    Anion gap 4 (*)    All other components within normal limits  CBC WITH DIFFERENTIAL/PLATELET     RADIOLOGY I viewed and interpreted the patient's right lower extremity venous Doppler ultrasound.  I do not see any obstruction or evidence of thrombus.  The radiologist report concurs that there are no acute abnormalities.    PROCEDURES:  Critical Care performed: No  Procedures   MEDICATIONS ORDERED IN ED: Medications - No  data to display   IMPRESSION / MDM / [place] / ED COURSE  I reviewed the triage vital signs and the nursing notes.                              Differential diagnosis includes, but is not limited to, DVT, cellulitis, musculoskeletal strain, venous congestion, dependent edema.  Patient's presentation initially was concerning for and acute presentation with potential threat to life or bodily function.  However, the patient's ultrasound was negative and her physical exam is reassuring.  Vital signs are stable and within normal limits.  The patient says that it is fine, she can follow-up with Dr. Delana Meyer, she just wanted to make sure she did not have a blood clot that could be dangerous to her.  I provided reassurance and encouraged her to follow-up as an outpatient.  I gave my usual and customary management recommendations and return precautions.         FINAL CLINICAL IMPRESSION(S) / ED DIAGNOSES   Final diagnoses:  Right leg pain     Rx / DC Orders   ED Discharge  Orders     None        Note:  This document was prepared using Dragon voice recognition software and may include unintentional dictation errors.   Hinda Kehr, MD 04/12/22 [phone]

## 2022-04-16 ENCOUNTER — Telehealth: Payer: Self-pay | Admitting: Family

## 2022-04-16 ENCOUNTER — Other Ambulatory Visit (INDEPENDENT_AMBULATORY_CARE_PROVIDER_SITE_OTHER): Payer: Self-pay | Admitting: Nurse Practitioner

## 2022-04-16 DIAGNOSIS — M79604 Pain in right leg: Secondary | ICD-10-CM

## 2022-04-16 DIAGNOSIS — H6504 Acute serous otitis media, recurrent, right ear: Secondary | ICD-10-CM

## 2022-04-16 DIAGNOSIS — F419 Anxiety disorder, unspecified: Secondary | ICD-10-CM

## 2022-04-17 NOTE — Telephone Encounter (Signed)
Pt need refill on buspar sent to [facility] [address]. Pt has TOC from [facility] on 8/18

## 2022-04-18 ENCOUNTER — Encounter: Payer: Self-pay | Admitting: Family

## 2022-04-20 ENCOUNTER — Other Ambulatory Visit: Payer: Self-pay

## 2022-04-20 MED ORDER — BUSPIRONE HCL 5 MG PO TABS: 5.0000 mg | ORAL_TABLET | Freq: Three times a day (TID) | ORAL | 3 refills | Status: DC

## 2022-04-20 NOTE — Addendum Note (Signed)
Addended by: Burnard Hawthorne on: 04/20/2022 09:58 AM   Modules accepted: Orders

## 2022-04-20 NOTE — Telephone Encounter (Signed)
Called patient to inform her that refill for Buspar was sent

## 2022-04-20 NOTE — Telephone Encounter (Signed)
Call pt Buspar refilled

## 2022-04-29 ENCOUNTER — Ambulatory Visit (INDEPENDENT_AMBULATORY_CARE_PROVIDER_SITE_OTHER): Payer: No Typology Code available for payment source | Admitting: Nurse Practitioner

## 2022-04-29 ENCOUNTER — Encounter (INDEPENDENT_AMBULATORY_CARE_PROVIDER_SITE_OTHER): Payer: Self-pay | Admitting: Nurse Practitioner

## 2022-04-29 ENCOUNTER — Ambulatory Visit (INDEPENDENT_AMBULATORY_CARE_PROVIDER_SITE_OTHER): Payer: No Typology Code available for payment source

## 2022-04-29 VITALS — BP 114/78 | HR 101 | Resp 16 | Ht 64.0 in | Wt 186.0 lb

## 2022-04-29 DIAGNOSIS — I83813 Varicose veins of bilateral lower extremities with pain: Secondary | ICD-10-CM

## 2022-04-29 DIAGNOSIS — M79604 Pain in right leg: Secondary | ICD-10-CM

## 2022-05-09 ENCOUNTER — Other Ambulatory Visit: Payer: Self-pay | Admitting: Neurology

## 2022-05-11 ENCOUNTER — Other Ambulatory Visit: Payer: Self-pay

## 2022-05-11 ENCOUNTER — Encounter: Payer: Self-pay | Admitting: Neurology

## 2022-05-11 MED ORDER — NORTRIPTYLINE HCL 10 MG PO CAPS: ORAL_CAPSULE | ORAL | 5 refills | Status: DC

## 2022-05-12 ENCOUNTER — Encounter (INDEPENDENT_AMBULATORY_CARE_PROVIDER_SITE_OTHER): Payer: Self-pay | Admitting: Nurse Practitioner

## 2022-05-12 NOTE — Progress Notes (Signed)
Subjective:    Patient ID: Isabel [name], female    DOB: 21-Oct-1991, 31 y.o.   MRN: [id number] No chief complaint on file.   The patient returns to the office for followup status post laser ablation of the right saphenous vein on 09/05/2020.  The patient note significant improvement in the lower extremity pain but not resolution of the symptoms.  Initially, the patient had relief post ablation with sclerotherapy however the pain has resumed.  The patient notes multiple residual varicosities bilaterally which continued to hurt with dependent positions and remained tender to palpation. The patient's swelling is minimally from preoperative status. The patient continues to wear graduated compression stockings on a daily basis but these are not eliminating the pain and discomfort. The patient continues to use over-the-counter anti-inflammatory medications to treat the pain and related symptoms but this has not given the patient relief. The patient notes the pain in the lower extremities is causing problems with daily exercise, problems at work and even with household activities such as preparing meals and doing dishes.  The patient is otherwise done well and there have been no complications related to the laser procedure or interval changes in the patient's overall   Post laser ultrasound shows successful ablation of the right great saphenous vein.      Review of Systems  Cardiovascular:  Positive for leg swelling.  All other systems reviewed and are negative.      Objective:   Physical Exam Vitals reviewed.  HENT:     Head: Normocephalic.  Cardiovascular:     Rate and Rhythm: Normal rate.     Pulses: Normal pulses.  Pulmonary:     Effort: Pulmonary effort is normal.  Skin:    General: Skin is warm and dry.  Neurological:     Mental Status: She is alert and oriented to person, place, and time.  Psychiatric:        Mood and Affect: Mood normal.        Behavior: Behavior normal.         Thought Content: Thought content normal.        Judgment: Judgment normal.     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  Comment: Husband plans vasectomy   Other Topics Concern   Not on file  Social History Narrative   Right handed   Lives alone one story   Kids 23 ( boy) , 67 ( girl) , 5 ( boy)   Social Determinants of Radio broadcast assistant Strain: Not on file  Food Insecurity: Not on file  Transportation Needs: Not on file  Physical Activity: Not on file  Stress: Not on file  Social Connections: Not on file  Intimate Partner Violence: Not on file    Past Surgical History:  Procedure Laterality Date   BREAST BIOPSY Right 07/04/2020   stereo biopsy/ ribbon clip/dense stromal fibrosis and fibrocystic changes. Negative for ayptia or malignancy   BREAST BIOPSY Left 09/10/2021   stereo bx "coil" clip-path pending   CHOLECYSTECTOMY       Family History  Problem Relation Age of Onset   Breast cancer Mother 11       currently 50   Hypertension Father    Arthritis Other    Diabetes Other    Cancer Other    Diabetes Maternal Grandmother    Diabetes Maternal Grandfather    Diabetes Paternal Grandmother    Diabetes Paternal Grandfather    Breast cancer Maternal Aunt 40       currently 36; PALB2 mutation   Colon cancer Neg Hx    Heart disease Neg Hx     Allergies  Allergen Reactions   Amoxicillin Hives   Penicillins Hives       Latest Ref Rng & Units 04/11/2022    8:58 PM 10/15/2021   11:16 AM 06/06/2020   11:17 AM  CBC  WBC 4.0 - 10.5 K/uL 10.2  7.3  9.2   Hemoglobin 12.0 - 15.0 g/dL 14.2  15.1  15.6   Hematocrit 36.0 - 46.0 % 42.3  44.7  45.1   Platelets 150 - 400 K/uL 262  255.0  252       CMP     Component Value Date/Time   NA 138 04/11/2022 2058   NA 138 06/06/2020 1117   NA 139 12/02/2014 1042   K 4.6 04/11/2022 2058   K 4.1 12/02/2014 1042   CL 104 04/11/2022 2058   CL 106 12/02/2014 1042   CO2 30 04/11/2022 2058   CO2 27 12/02/2014 1042   GLUCOSE 105 (H) 04/11/2022 2058   GLUCOSE 109 (H) 12/02/2014 1042   BUN 11 04/11/2022 2058   BUN 6 06/06/2020 1117   BUN 7 12/02/2014 1042   CREATININE 0.90 04/11/2022 2058   CREATININE 0.89 12/02/2014 1042   CALCIUM 8.6 (L) 04/11/2022 2058   CALCIUM 8.8 12/02/2014 1042   PROT 7.0 04/11/2022 2058   PROT 6.7 06/06/2020 1117   PROT 7.3 12/02/2014 1042   ALBUMIN 3.6 04/11/2022 2058   ALBUMIN 4.4 06/06/2020 1117   ALBUMIN 3.6 12/02/2014 1042   AST 14 (L) 04/11/2022 2058   AST 13 (L) 12/02/2014 1042   ALT 10 04/11/2022 2058   ALT 18 12/02/2014 1042   ALKPHOS 61 04/11/2022 2058   ALKPHOS 75 12/02/2014 1042   BILITOT 0.4 04/11/2022 2058   BILITOT 0.3 06/06/2020 1117   BILITOT 0.5 12/02/2014 1042   GFRNONAA >60 04/11/2022 2058   GFRNONAA >60 12/02/2014 1042   GFRNONAA >60 07/18/2014 0756   GFRAA 118 06/06/2020 1117   GFRAA >60 12/02/2014 1042    GFRAA >60 07/18/2014 0756     No results found.     Assessment &  Plan:   1. Varicose veins of bilateral lower extremities with pain Recommend:  The patient has had successful ablation of the previously incompetent saphenous venous system but still has persistent symptoms of pain and swelling that are having a negative impact on daily life and daily activities.  Patient should undergo injection sclerotherapy to treat the residual varicosities.  The risks, benefits and alternative therapies were reviewed in detail with the patient.  All questions were answered.  The patient agrees to proceed with sclerotherapy at their convenience.  The patient will continue wearing the graduated compression stockings and using the over-the-counter pain medications to treat her symptoms.        Current Outpatient Medications on File Prior to Visit  Medication Sig Dispense Refill   busPIRone (BUSPAR) 5 MG tablet Take 1 tablet (5 mg total) by mouth 3 (three) times daily. 270 tablet 3   gabapentin (NEURONTIN) 100 MG capsule TAKE 2 CAPSULES BY MOUTH 2 TIMES DAILY. 360 capsule 0   [DISCONTINUED] famotidine (PEPCID) 20 MG tablet Take 1 tablet (20 mg total) by mouth 2 (two) times daily. 60 tablet 0   No current facility-administered medications on file prior to visit.    There are no Patient Instructions on file for this visit. No follow-ups on file.   Kris Hartmann, NP

## 2022-06-03 ENCOUNTER — Other Ambulatory Visit: Payer: Self-pay | Admitting: Internal Medicine

## 2022-06-03 DIAGNOSIS — M5441 Lumbago with sciatica, right side: Secondary | ICD-10-CM

## 2022-06-06 ENCOUNTER — Other Ambulatory Visit: Payer: Self-pay | Admitting: Neurology

## 2022-06-26 ENCOUNTER — Ambulatory Visit: Payer: PRIVATE HEALTH INSURANCE | Admitting: Family

## 2022-06-26 ENCOUNTER — Encounter: Payer: Self-pay | Admitting: Family

## 2022-06-26 VITALS — BP 130/86 | HR 105 | Temp 97.7°F | Ht 64.0 in | Wt 187.8 lb

## 2022-06-26 DIAGNOSIS — F419 Anxiety disorder, unspecified: Secondary | ICD-10-CM

## 2022-06-26 DIAGNOSIS — H6991 Unspecified Eustachian tube disorder, right ear: Secondary | ICD-10-CM | POA: Insufficient documentation

## 2022-06-26 DIAGNOSIS — Z803 Family history of malignant neoplasm of breast: Secondary | ICD-10-CM | POA: Diagnosis not present

## 2022-06-26 DIAGNOSIS — H6981 Other specified disorders of Eustachian tube, right ear: Secondary | ICD-10-CM | POA: Diagnosis not present

## 2022-06-26 DIAGNOSIS — N946 Dysmenorrhea, unspecified: Secondary | ICD-10-CM | POA: Diagnosis not present

## 2022-06-26 LAB — CBC WITH DIFFERENTIAL/PLATELET
Basophils Absolute: 0 10*3/uL (ref 0.0–0.1)
Basophils Relative: 0.2 % (ref 0.0–3.0)
Eosinophils Absolute: 0.2 10*3/uL (ref 0.0–0.7)
Eosinophils Relative: 2.5 % (ref 0.0–5.0)
HCT: 39.8 % (ref 36.0–46.0)
Hemoglobin: 13.5 g/dL (ref 12.0–15.0)
Lymphocytes Relative: 21.7 % (ref 12.0–46.0)
Lymphs Abs: 1.6 10*3/uL (ref 0.7–4.0)
MCHC: 33.9 g/dL (ref 30.0–36.0)
MCV: 91.5 fl (ref 78.0–100.0)
Monocytes Absolute: 0.6 10*3/uL (ref 0.1–1.0)
Monocytes Relative: 8.6 % (ref 3.0–12.0)
Neutro Abs: 5 10*3/uL (ref 1.4–7.7)
Neutrophils Relative %: 67 % (ref 43.0–77.0)
Platelets: 273 10*3/uL (ref 150.0–400.0)
RBC: 4.35 Mil/uL (ref 3.87–5.11)
RDW: 13.1 % (ref 11.5–15.5)
WBC: 7.5 10*3/uL (ref 4.0–10.5)

## 2022-06-26 LAB — COMPREHENSIVE METABOLIC PANEL
ALT: 9 U/L (ref 0–35)
AST: 12 U/L (ref 0–37)
Albumin: 4 g/dL (ref 3.5–5.2)
Alkaline Phosphatase: 82 U/L (ref 39–117)
BUN: 8 mg/dL (ref 6–23)
CO2: 31 mEq/L (ref 19–32)
Calcium: 9.1 mg/dL (ref 8.4–10.5)
Chloride: 101 mEq/L (ref 96–112)
Creatinine, Ser: 0.84 mg/dL (ref 0.40–1.20)
GFR: 92.94 mL/min (ref >60.00)
Glucose, Bld: 81 mg/dL (ref 70–99)
Potassium: 4.5 mEq/L (ref 3.5–5.1)
Sodium: 139 mEq/L (ref 135–145)
Total Bilirubin: 0.4 mg/dL (ref 0.2–1.2)
Total Protein: 6.6 g/dL (ref 6.0–8.3)

## 2022-06-26 LAB — FOLLICLE STIMULATING HORMONE: FSH: 9.5 m[IU]/mL

## 2022-06-26 LAB — HEMOGLOBIN A1C: Hgb A1c MFr Bld: 5.1 % (ref 4.6–6.5)

## 2022-06-26 LAB — TSH: TSH: 2.18 u[IU]/mL (ref 0.35–5.50)

## 2022-06-26 MED ORDER — BUSPIRONE HCL 5 MG PO TABS: ORAL_TABLET | ORAL | 6 refills | Status: DC

## 2022-06-26 MED ORDER — AZELASTINE HCL 0.1 % NA SOLN: 1.0000 | Freq: Two times a day (BID) | NASAL | 4 refills | Status: DC

## 2022-06-26 NOTE — Progress Notes (Signed)
Subjective:    Patient ID: Isabel [name], female    DOB: 1991-11-05, 31 y.o.   MRN: [id number]  CC: Isabel [name] is a 31 y.o. female who presents today for follow up.   HPI: She noticed 3 months ago, menses became inconsistent.  She would miss a menses one month, and then have a normal menses the following.  She endorses stress with family and weight gain. She has gained weight; previously 135lbs in 2021.   Endorses poor diet. Skips breakfast. Eats at 2pm such as a bag of chips. Last night baked chicken and corn. She is eating dark chocolate daily. She drinks one soda daily .  No formal exercise aside from taking care of children.   LMP  one month ago  No ocp due to breast cancer in family. She is not taking measures to prevent pregnancy   She has 63 and 31 year old.  No h/o PCOS   US pelvic 03/2020 -Resolution of previously identified complicated cyst/corpus luteum of the RIGHT ovary.   Small corpus luteum LEFT ovary without additional pelvic sonographic abnormalities.  Anxiety - she is compliant with buspar 78m tid with relief. She did not care for Zoloft.  She will occasionally have increased anxiety at nighttime.  No depression.   Right ear pressure for weeks, unchanged.  No purulent discharge pain fever headache sinus pain or nasal congestion.  She suspects that is allergies.  She is taking children clartin.   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 Negative for ayptia  or malignancy   BREAST BIOPSY Left 09/10/2021   stereo bx "coil" clip-path pending   CHOLECYSTECTOMY     Family History  Problem Relation Age of Onset   Breast cancer Mother 355      currently 421  Hypertension Father    Arthritis Other    Diabetes Other    Cancer Other    Diabetes Maternal Grandmother    Diabetes Maternal Grandfather    Diabetes Paternal Grandmother    Diabetes Paternal Grandfather    Breast cancer Maternal Aunt 579      currently 574 PALB2 mutation   Colon cancer Neg Hx    Heart disease Neg Hx     Allergies: Amoxicillin and Penicillins Current Outpatient Medications on File Prior to Visit  Medication Sig Dispense Refill   gabapentin (NEURONTIN) 100 MG capsule TAKE 2 CAPSULES BY MOUTH TWICE A DAY 360 capsule 0   nortriptyline (PAMELOR) 10 MG capsule Take 162mby mouth at bedtime. 30 capsule 5   [DISCONTINUED] famotidine (PEPCID) 20 MG tablet Take 1 tablet (20 mg total) by mouth 2 (two) times daily. 60 tablet 0   No current facility-administered medications on file prior to visit.    Social History   Tobacco Use   Smoking status: Every Day    Packs/day: 0.50    Types: Cigarettes   Smokeless tobacco: Never  Vaping Use   Vaping Use: Former  Substance Use Topics   Alcohol use: No   Drug use: Not Currently    Types: Marijuana    Review of Systems  Constitutional:  Negative for chills and fever.  HENT:  Positive for ear pain (pressure). Negative for congestion, ear discharge, sore throat and trouble swallowing.   Respiratory:  Negative for cough.   Cardiovascular:  Negative for chest pain and palpitations.  Gastrointestinal:  Negative for nausea and vomiting.      Objective:    BP 130/86 (BP Location: Left Arm, Patient Position: Sitting, Cuff Size: Normal)   Pulse (!) 105   Temp 97.7 F (36.5 C) (Oral)   Ht _0  (1.626 m)   Wt 187 lb 12.8 oz (85.2 kg)   LMP 06/09/2022 (Exact Date)   SpO2 97%   BMI 32.24 kg/m  BP Readings from Last 3  Encounters:  06/26/22 130/86  04/29/22 114/78  04/12/22 114/80   Wt Readings from Last 3 Encounters:  06/26/22 187 lb 12.8 oz (85.2 kg)  04/29/22 186 lb (84.4 kg)  04/11/22 180 lb (81.6 kg)    Physical Exam Vitals reviewed.  Constitutional:      Appearance: She is well-developed.  HENT:     Head: Normocephalic and atraumatic.     Right Ear: Hearing, tympanic membrane, ear canal and external ear normal. No decreased hearing noted. No drainage, swelling or tenderness. No middle ear effusion. No foreign body. Tympanic membrane is not erythematous or bulging.     Left Ear: Hearing, tympanic membrane, ear canal and external ear normal. No decreased hearing noted. No drainage, swelling or tenderness.  No middle ear effusion. No foreign body. Tympanic membrane is not erythematous or bulging.     Nose: Nose normal. No rhinorrhea.     Right Sinus: No maxillary sinus tenderness or frontal sinus tenderness.     Left Sinus: No maxillary sinus tenderness or frontal sinus tenderness.     Mouth/Throat:     Pharynx: Uvula midline. No oropharyngeal exudate or posterior oropharyngeal erythema.     Tonsils: No tonsillar abscesses.  Eyes:     Conjunctiva/sclera: Conjunctivae normal.  Cardiovascular:     Rate and Rhythm: Regular rhythm.     Pulses: Normal pulses.     Heart sounds: Normal heart sounds.  Pulmonary:     Effort: Pulmonary effort is normal.     Breath sounds: Normal breath sounds. No wheezing, rhonchi or rales.  Lymphadenopathy:     Head:     Right side of head: No submental, submandibular, tonsillar, preauricular, posterior auricular or occipital adenopathy.     Left side of head: No submental, submandibular, tonsillar, preauricular, posterior auricular or occipital adenopathy.     Cervical: No cervical adenopathy.  Skin:    General: Skin is warm and dry.  Neurological:     Mental Status: She is alert.  Psychiatric:        Speech: Speech normal.        Behavior: Behavior normal.         Thought Content: Thought content normal.        Assessment & Plan:   Problem List Items Addressed This Visit       Nervous and Auditory   Chronic dysfunction of right eustachian tube    Ear does not appear grossly infected.  Suspect seasonal allergies playing a role.  Patient will continue Claritin. trial of azelastine.  She will let me know if no resolution      Relevant Medications  azelastine (ASTELIN) 0.1 % nasal spray     Genitourinary   Dysmenorrhea - Primary    Etiology nonspecific at this time.  Pending labs. I do think weight gain and stress may be contributory ?PCOS.  Consider repeat transvaginal ultrasound .        Relevant Orders   TSH   Hemoglobin A1c   Comprehensive metabolic panel   CBC with Differential/Platelet   FSH   hCG, serum, qualitative   Testosterone , Free and Total   Testosterone,Free and Total     Other   Anxiety    Chronic, suboptimal control.  We agreed to increase BuSpar.  She will take BuSpar 5 mg every morning, BuSpar 5 mg in the afternoon and BuSpar 10 mg every afternoon.  She will let me know if she is doing      Relevant Medications   busPIRone (BUSPAR) 5 MG tablet   Family history of breast cancer in mother    Patient will be due for mammogram next month.  I have ordered.  She will schedule      Relevant Orders   MM 3D SCREEN BREAST BILATERAL     I have discontinued Micalah M. Pranger's busPIRone. I am also having her start on busPIRone and azelastine. Additionally, I am having her maintain her nortriptyline and gabapentin.   Meds ordered this encounter  Medications   busPIRone (BUSPAR) 5 MG tablet    Sig: Take 1 tablet in the morning, 1 tablet afternoon, two at nighttime.    Dispense:  120 tablet    Refill:  6    Order Specific Question:   Supervising Provider    Answer:   Deborra Medina L [[id number]]   azelastine (ASTELIN) 0.1 % nasal spray    Sig: Place 1 spray into both nostrils 2 (two) times daily. Use in each  nostril as directed    Dispense:  30 mL    Refill:  4    Order Specific Question:   Supervising Provider    Answer:   Crecencio Mc [[id number]]    Return precautions given.   Risks, benefits, and alternatives of the medications and treatment plan prescribed today were discussed, and patient expressed understanding.   Education regarding symptom management and diagnosis given to patient on AVS.  Continue to follow with Burnard Hawthorne, FNP for routine health maintenance.   Andrez Grime and I agreed with plan.   Mable Paris, FNP

## 2022-06-26 NOTE — Assessment & Plan Note (Signed)
Patient will be due for mammogram next month.  I have ordered.  She will schedule

## 2022-06-26 NOTE — Assessment & Plan Note (Signed)
Chronic, suboptimal control.  We agreed to increase BuSpar.  She will take BuSpar 5 mg every morning, BuSpar 5 mg in the afternoon and BuSpar 10 mg every afternoon.  She will let me know if she is doing

## 2022-06-26 NOTE — Assessment & Plan Note (Addendum)
Ear does not appear grossly infected.  Suspect seasonal allergies playing a role.  Patient will continue Claritin. trial of azelastine.  She will let me know if no resolution

## 2022-06-26 NOTE — Patient Instructions (Addendum)
I have increased BuSpar to 10 mg at bedtime.  Please see new prescription   We will start with thorough lab evaluation due to abnormal periods.    Please download Myfitness Pal App ( free). You may log every thing you eat for even 2-3 days to get a better of idea of true daily calories. To loose weight, you have to use more calories than than consumed and essentially create caloric deficit to loose weight. The goal is 1-2 lbs per week of weight loss.   You review below from [facility].   https://[email or web address].co.uk  Calorie counting made easy  Eat less, exercise more. If only it were that simple! As most dieters know, losing weight can be very challenging. As this report details, a range of influences can affect how people gain and lose weight. But a basic understanding of how to tip your energy balance in favor of weight loss is a good place to start.  Start by determining how many calories you should consume each day. To do so, you need to know how many calories you need to maintain your current weight. Doing this requires a few simple calculations.  First, multiply your current weight by 15 -- that's roughly the number of calories per pound of body weight needed to maintain your current weight if you are moderately active. Moderately active means getting at least 30 minutes of physical activity a day in the form of exercise (walking at a brisk pace, climbing stairs, or active gardening). Let's say you're a woman who is 5 feet, 4 inches tall and weighs 155 pounds, and you need to lose about 15 pounds to put you in a healthy weight range. If you multiply 155 by 15, you will get 2,325, which is the number of calories per day that you need in order to maintain your current weight (weight-maintenance calories). To lose weight, you will need to get below that total.  For example, to lose 1 to 2 pounds a week -- a rate that experts consider safe --  your food consumption should provide 500 to 1,000 calories less than your total weight-maintenance calories. If you need 2,325 calories a day to maintain your current weight, reduce your daily calories to between 1,325 and 1,825. If you are sedentary, you will also need to build more activity into your day. In order to lose at least a pound a week, try to do at least 30 minutes of physical activity on most days, and reduce your daily calorie intake by at least 500 calories. However, calorie intake should not fall below 1,200 a day in women or 1,500 a day in men, except under the supervision of a health professional. Eating too few calories can endanger your health by depriving you of needed nutrients.  Meeting your calorie target How can you meet your daily calorie target? One approach is to add up the number of calories per serving of all the foods that you eat, and then plan your menus accordingly. You can buy books that list calories per serving for many foods. In addition, the nutrition labels on all packaged foods and beverages provide calories per serving information. Make a point of reading the labels of the foods and drinks you use, noting the number of calories and the serving sizes. Many recipes published in cookbooks, newspapers, and magazines provide similar information.  If you hate counting calories, a different approach is to restrict how much and how often you eat, and to eat meals that  are low in calories. Dietary guidelines issued by the American Heart Association stress common sense in choosing your foods rather than focusing strictly on numbers, such as total calories or calories from fat. Whichever method you choose, research shows that a regular eating schedule -- with meals and snacks planned for certain times each day -- makes for the most successful approach. The same applies after you have lost weight and want to keep it off. Sticking with an eating schedule increases your chance of  maintaining your new weight.    This is  Dr. Lupita Dawn  ( an amazing physician in my office!)  example of a  "Low GI"  Diet:  It will allow you to lose 4 to 8  lbs  per month if you follow it carefully.  Your goal with exercise is a minimum of 30 minutes of aerobic exercise 5 days per week (Walking does not count once it becomes easy!)    All of the foods can be found at grocery stores and in bulk at [employer].  The Atkins protein bars and shakes are available in more varieties at [employer], [employer] and [place].     7 AM Breakfast:  Choose from the following:  Low carbohydrate Protein  Shakes (I recommend the  Premier Protein chocolate shakes,  EAS AdvantEdge "Carb Control" shakes  Or the Atkins shakes all are under 3 net carbs)     a scrambled egg/bacon/cheese burrito made with Mission's "carb balance" whole wheat tortilla  (about 10 net carbs )  Regulatory affairs officer (basically a quiche without the pastry crust) that is eaten cold and very convenient way to get your eggs.  8 carbs)  If you make your own protein shakes, avoid bananas and pineapple,  And use low carb greek yogurt or original /unsweetened almond or soy milk    Avoid cereal and bananas, oatmeal and cream of wheat and grits. They are loaded with carbohydrates!   10 AM: high protein snack:  Protein bar by Atkins (the snack size, under 200 cal, usually < 6 net carbs).    A stick of cheese:  Around 1 carb,  100 cal     Dannon Light n Fit [place] Yogurt  (80 cal, 8 carbs)  Other so called "protein bars" and Greek yogurts tend to be loaded with carbohydrates.  Remember, in food advertising, the word "energy" is synonymous for " carbohydrate."  Lunch:   A Sandwich using the bread choices listed, Can use any  Eggs,  lunchmeat, grilled meat or canned tuna), avocado, regular mayo/mustard  and cheese.  A Salad using blue cheese, ranch,  Goddess or vinagrette,  Avoid taco shells, croutons or "confetti" and no "candied  nuts" but regular nuts OK.   No pretzels, nabs  or chips.  Pickles and miniature sweet peppers are a good low carb alternative that provide a "crunch"  The bread is the only source of carbohydrate in a sandwich and  can be decreased by trying some of the attached alternatives to traditional loaf bread   Avoid "Low fat dressings, as well as [place] dressings They are loaded with sugar!   3 PM/ Mid day  Snack:  Consider  1 ounce of  almonds, walnuts, pistachios, pecans, peanuts,  Macadamia nuts or a nut medley.  Avoid "granola and granola bars "  Mixed nuts are ok in moderation as long as there are no raisins,  cranberries or dried fruit.   KIND  bars are OK if you get the low glycemic index variety   Try the prosciutto/mozzarella cheese sticks by [employer]  In deli /backery section   High protein      6 PM  Dinner:     Meat/fowl/fish with a green salad, and either broccoli, cauliflower, green beans, spinach, brussel sprouts or  Lima beans. DO NOT BREAD THE PROTEIN!!      There is a low carb pasta by Dreamfield's that is acceptable and tastes great: only 5 digestible carbs/serving.( All grocery stores but BJs carry it ) Several ready made meals are available low carb:   Try Michel Angelo's chicken piccata or chicken or eggplant parm over low carb pasta.(Lowes and BJs)   Marjory Lies Sanchez's "Carnitas" (pulled pork, no sauce,  0 carbs) or his beef pot roast to make a dinner burrito (at [employer]'s)  Pesto over low carb pasta (bj's sells a good quality pesto in the center refrigerated section of the deli   Try satueeing  Cheral Marker with mushroooms as a good side   [employer] Giant makes a mashed cauliflower that tastes like mashed potatoes  Whole wheat pasta is still full of digestible carbs and  Not as low in glycemic index as Dreamfield's.   Brown rice is still rice,  So skip the rice and noodles if you eat [place] or [place] (or at least limit to 1/2 cup)  9 PM snack :   Breyer's "low carb"  fudgsicle or  ice cream bar (Carb Smart line), or  Weight Watcher's ice cream bar , or another "no sugar added" ice cream;  a serving of fresh berries/cherries with whipped cream   Cheese or DANNON'S LlGHT N FIT GREEK YOGURT  8 ounces of [place] unsweetened almond/cococunut milk    Treat yourself to a parfait made with whipped cream blueberiies, walnuts and vanilla greek yogurt  Avoid bananas, pineapple, grapes  and watermelon on a regular basis because they are high in sugar.  THINK OF THEM AS DESSERT  Remember that snack Substitutions should be less than 10 NET carbs per serving and meals < 20 carbs. Remember to subtract fiber grams to get the "net carbs."

## 2022-06-26 NOTE — Assessment & Plan Note (Addendum)
Etiology nonspecific at this time.  Pending labs. I do think weight gain and stress may be contributory ?PCOS.  Consider repeat transvaginal ultrasound .

## 2022-06-27 LAB — HCG, SERUM, QUALITATIVE: Preg, Serum: NEGATIVE

## 2022-07-05 ENCOUNTER — Other Ambulatory Visit: Payer: Self-pay | Admitting: Neurology

## 2022-07-05 LAB — TESTOSTERONE,FREE AND TOTAL
Testosterone, Free: 0.3 pg/mL (ref 0.0–4.2)
Testosterone: 23 ng/dL (ref 13–71)

## 2022-07-06 ENCOUNTER — Other Ambulatory Visit: Payer: Self-pay

## 2022-07-06 MED ORDER — NORTRIPTYLINE HCL 10 MG PO CAPS: ORAL_CAPSULE | ORAL | 0 refills | Status: DC

## 2022-07-07 ENCOUNTER — Encounter: Payer: Self-pay | Admitting: Family

## 2022-07-08 ENCOUNTER — Telehealth: Payer: Self-pay | Admitting: Family

## 2022-07-08 ENCOUNTER — Other Ambulatory Visit: Payer: Self-pay | Admitting: Family

## 2022-07-08 DIAGNOSIS — N946 Dysmenorrhea, unspecified: Secondary | ICD-10-CM

## 2022-07-08 NOTE — Telephone Encounter (Signed)
Lft pt vm to call and sch [place] at [phone] press option 3 and then 2.

## 2022-07-10 ENCOUNTER — Other Ambulatory Visit: Payer: Self-pay | Admitting: Family

## 2022-07-10 DIAGNOSIS — H6981 Other specified disorders of Eustachian tube, right ear: Secondary | ICD-10-CM

## 2022-07-10 MED ORDER — AZITHROMYCIN 250 MG PO TABS: ORAL_TABLET | ORAL | 0 refills | Status: AC

## 2022-07-22 ENCOUNTER — Other Ambulatory Visit: Payer: Self-pay | Admitting: Family

## 2022-07-22 DIAGNOSIS — F419 Anxiety disorder, unspecified: Secondary | ICD-10-CM

## 2022-07-28 ENCOUNTER — Ambulatory Visit
Admission: EM | Admit: 2022-07-28 | Discharge: 2022-07-28 | Disposition: A | Discharge status: Home / Self Care | Payer: BC Managed Care – PPO | Attending: Emergency Medicine | Admitting: Emergency Medicine

## 2022-07-28 DIAGNOSIS — M436 Torticollis: Secondary | ICD-10-CM

## 2022-07-28 MED ORDER — METHOCARBAMOL 500 MG PO TABS
500.0000 mg | ORAL_TABLET | Freq: Two times a day (BID) | ORAL | 0 refills | Status: DC | PRN
Start: 2022-07-28 — End: 2023-04-30
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
## 2022-07-28 NOTE — Discharge Instructions (Addendum)
Take Tylenol or ibuprofen as needed for discomfort.    Take the muscle relaxer as needed for muscle spasm; Do not drive, operate machinery, or drink alcohol with this medication as it can cause drowsiness.   Follow up with your primary care provider or an orthopedist if your symptoms are not improving.     
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
## 2022-08-04 ENCOUNTER — Ambulatory Visit
Admission: RE | Admit: 2022-08-04 | Discharge: 2022-08-04 | Disposition: A | Discharge status: Home / Self Care | Payer: PRIVATE HEALTH INSURANCE | Source: Ambulatory Visit | Attending: Family | Admitting: Family

## 2022-08-04 ENCOUNTER — Telehealth: Payer: Self-pay | Admitting: Family

## 2022-08-04 DIAGNOSIS — N946 Dysmenorrhea, unspecified: Secondary | ICD-10-CM | POA: Insufficient documentation

## 2022-08-04 NOTE — Telephone Encounter (Signed)
Spoke to patient over the phone.  Discussed pelvic US , bilateral ovarian masses seen, right side slightly larger.  Advised the need for further evaluation, possible surgical consultation.  Advised her that I sent a secure chat to Dr. Marcelline Mates so that she was aware.  She is established patient and advised patient to call Dr. Andreas Blower office right away to schedule an appointment.  She will let me know if she is anything at all

## 2022-08-05 ENCOUNTER — Telehealth: Payer: Self-pay | Admitting: Obstetrics and Gynecology

## 2022-08-05 ENCOUNTER — Encounter: Payer: Self-pay | Admitting: Family

## 2022-08-05 DIAGNOSIS — N83201 Unspecified ovarian cyst, right side: Secondary | ICD-10-CM

## 2022-08-05 NOTE — Telephone Encounter (Signed)
Pt called wanting to talk to the provider about her ultrasound

## 2022-08-05 NOTE — Telephone Encounter (Signed)
Contacted patient about recent ultrasound results. Left detailed message regarding findings of right ovarian cyst (likely hemorrhagic). Advised that no interventions required at this time as these will usually resolve on their own, however if having significant pain, can have pain medications sent in.  Also would recommend repeating the scan in 4-6 weeks. Patient advised to contact the office with any questions.

## 2022-08-05 NOTE — Telephone Encounter (Signed)
I reviewed the scan yesterday and it is not a critical scan. I will contact the patient today.

## 2022-08-05 NOTE — Telephone Encounter (Signed)
[employer] called on behalf of this patient. Stated that they sent a message to provider. States this patient had an US done on 08/04/22 and a mass was found. Spoke with CMA who advised me to send another message to provider with high priority so provider can review [place] and proceed with next steps. Please advise.

## 2022-08-10 ENCOUNTER — Other Ambulatory Visit: Payer: Self-pay | Admitting: Family

## 2022-08-10 ENCOUNTER — Telehealth: Payer: Self-pay

## 2022-08-10 ENCOUNTER — Telehealth: Payer: Self-pay | Admitting: Obstetrics and Gynecology

## 2022-08-10 DIAGNOSIS — N946 Dysmenorrhea, unspecified: Secondary | ICD-10-CM

## 2022-08-10 MED ORDER — TRAMADOL HCL 50 MG PO TABS: 50.0000 mg | ORAL_TABLET | Freq: Four times a day (QID) | ORAL | 0 refills | Status: DC | PRN

## 2022-08-10 NOTE — Telephone Encounter (Signed)
Contacted patient to get her scheduled for her [place] and follow up with provider. No answer, Left VM for patient to contact the office for scheduling.

## 2022-08-10 NOTE — Telephone Encounter (Signed)
Pt aware.

## 2022-08-10 NOTE — Telephone Encounter (Signed)
Pt calling to have pain medicine sent in , she states that the heating pad and tylenol and ibuprofen is not helping the pain. Pharmacy is [facility] [address]

## 2022-08-11 NOTE — Progress Notes (Signed)
NEUROLOGY FOLLOW UP OFFICE NOTE  Isabel [name] [id number]  Assessment/Plan:   Migraine with and without aura   Migraine prevention:  nortriptyline 49m at bedtime Migraine rescue:  rizatriptan 153mLimit use of pain relievers to no more than 2 days out of week to prevent risk of rebound or medication-overuse headache. Keep headache diary Follow up 1 year   Subjective:  Isabel [name] a [age]ear old right-handed female who follows up for migraines.   UPDATE: Last seen July 2022.  At that time, rfilled Aimovig but she was still unable to get it from the pharmacy.  Started nortriptyline.  Doing well.   Intensity:  Mild, severe Duration:  30 minutes with rizatriptan Frequency:  2 in 30 days  To follow up on the incidental vestibular schwannoma, she had repeat MRI of brain/IAC with and without contrast on 03/05/2022, which was personally reviewed and showed stable partial empty sella but was negative for vestibular schwannoma.  The previous enhancement was likely vascular.   Frequency of abortive medication: 1 to 2 days a month Current NSAIDS/analgesics:  Children's tylenol Current triptans:  rizatriptan 102murrent ergotamine:  none Current anti-emetic:  none Current muscle relaxants:  none Current Antihypertensive medications:  none Current Antidepressant medications:  Nortriptyline 101m48m bedtime, Current Anticonvulsant medications:  gabapentin 200mg7m (sciatica) Current anti-CGRP:  Aimovig 140mg 25ment Vitamins/Herbal/Supplements:  none Current Antihistamines/Decongestants:  none Other therapy:  none   HISTORY:  She has remote history of migraines several years ago but then resolved.  She started getting new headaches in 2021.  She describes a diffuse severe throbbing headache with pins and needles sensation around her right eye with severe right eye pressure, that sometimes radiates to back of her neck.  It occurs daily but fluctuates.  Sometimes she sees  spots in both eyes.  She sometimes has nausea but no nausea.  Photophobia but no photophobia.   Sometimes she gets a paroxysmal stabbing pain in the mild-occipital region, occurring every now and then.  She started feeling numbness and tingling in the arms.  MRI of cervical spine from 03/17/2020 was unremarkable.  However, it did show evidence of empty sella.  MRA of neck was negative. She subsequently saw a chiropractor and neck pain and arm numbness has improved.  She was treated for recurrent ear infections.  She subsequently saw ENT who told her she did not have ear infection but that she should see neurology due to the empty sella finding on cervical MRI.  She denies visual obscurations and pulsatile tinnitus.  She does have high-frequency tinnitus.  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  She does endorse mild transient tinnitus   Referred to ophthalmology.  She saw Dr. WeaverKathlen Modyvember.  Exhibited dry eyes but no papilledema.     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 360 tablet 3   gabapentin (NEURONTIN) 100 MG capsule TAKE 2 CAPSULES BY MOUTH TWICE A DAY 360 capsule 0   methocarbamol (ROBAXIN) 500 MG tablet Take 1 tablet  (500 mg total) by mouth 2 (two) times daily as needed for muscle spasms. 10 tablet 0   traMADol (ULTRAM) 50 MG tablet Take 1-2 tablets (50-100 mg total) by mouth every 6 (six) hours as needed. 60 tablet 0   [DISCONTINUED] famotidine (PEPCID) 20 MG tablet Take 1 tablet (20 mg total) by mouth 2 (two) times daily. 60 tablet 0   No current facility-administered medications on file prior to visit.      ALLERGIES: Allergies  Allergen Reactions   Amoxicillin Hives   Penicillins Hives    FAMILY HISTORY: Family History  Problem Relation Age of Onset   Breast cancer Mother 20       currently 61   Hypertension Father    Arthritis Other    Diabetes Other    Cancer Other    Diabetes Maternal Grandmother    Diabetes Maternal Grandfather    Diabetes Paternal Grandmother    Diabetes Paternal Grandfather    Breast cancer Maternal Aunt 67       currently 82; PALB2 mutation   Colon cancer Neg Hx    Heart disease Neg Hx       Objective:  Blood pressure 107/77, pulse 66, resp. rate 18, height '5\' 4"'  (1.626 m), weight 188 lb (85.3 kg), SpO2 98 %. General: No acute distress.  Patient appears well-groomed.   Head:  Normocephalic/atraumatic Eyes:  Fundi examined but not visualized Neck: supple, no paraspinal tenderness, full range of motion Heart:  Regular rate and rhythm Neurological Exam: alert and oriented to person, place, and time.  Speech fluent and not dysarthric, language intact.  CN II-XII intact. Bulk and tone normal, muscle strength 5/5 throughout.  Sensation to light touch intact.  Deep tendon reflexes 2+ throughout, toes downgoing.  Finger to nose testing intact.  Gait normal, Romberg negative.   Metta Clines, DO  CC: Mable Paris, FNP

## 2022-08-12 ENCOUNTER — Encounter: Payer: Self-pay | Admitting: Obstetrics and Gynecology

## 2022-08-12 ENCOUNTER — Encounter (INDEPENDENT_AMBULATORY_CARE_PROVIDER_SITE_OTHER): Payer: BC Managed Care – PPO | Admitting: Obstetrics and Gynecology

## 2022-08-12 NOTE — Progress Notes (Signed)
Virtual Visit via Telephone Note  Attempted to contact patient by phone for virtual visit.  Received busy signal on initial call.  Second call reached voicemail.  Left message for patient to contact the office when available for her appointment.   [name], MD

## 2022-08-14 ENCOUNTER — Ambulatory Visit (INDEPENDENT_AMBULATORY_CARE_PROVIDER_SITE_OTHER): Payer: PRIVATE HEALTH INSURANCE | Admitting: Neurology

## 2022-08-14 ENCOUNTER — Encounter: Payer: Self-pay | Admitting: Neurology

## 2022-08-14 VITALS — BP 107/77 | HR 66 | Resp 18 | Ht 64.0 in | Wt 188.0 lb

## 2022-08-14 DIAGNOSIS — G43009 Migraine without aura, not intractable, without status migrainosus: Secondary | ICD-10-CM

## 2022-08-14 MED ORDER — RIZATRIPTAN BENZOATE 10 MG PO TABS
ORAL_TABLET | ORAL | 5 refills | Status: DC
Start: 2022-08-14 — End: 2023-04-30

## 2022-08-14 MED ORDER — NORTRIPTYLINE HCL 10 MG PO CAPS
ORAL_CAPSULE | ORAL | 3 refills | Status: DC
Start: 2022-08-14 — End: 2023-08-16

## 2022-08-23 ENCOUNTER — Other Ambulatory Visit: Payer: Self-pay | Admitting: Family

## 2022-08-23 DIAGNOSIS — H6991 Unspecified Eustachian tube disorder, right ear: Secondary | ICD-10-CM

## 2022-08-26 ENCOUNTER — Ambulatory Visit: Payer: PRIVATE HEALTH INSURANCE | Admitting: Family

## 2022-08-26 ENCOUNTER — Encounter: Payer: Self-pay | Admitting: Family

## 2022-08-26 VITALS — BP 118/78 | HR 99 | Temp 98.6°F | Ht 64.0 in | Wt 188.2 lb

## 2022-08-26 DIAGNOSIS — J029 Acute pharyngitis, unspecified: Secondary | ICD-10-CM | POA: Diagnosis not present

## 2022-08-26 DIAGNOSIS — R928 Other abnormal and inconclusive findings on diagnostic imaging of breast: Secondary | ICD-10-CM | POA: Diagnosis not present

## 2022-08-26 DIAGNOSIS — M5441 Lumbago with sciatica, right side: Secondary | ICD-10-CM

## 2022-08-26 LAB — POCT RAPID STREP A (OFFICE): Rapid Strep A Screen: NEGATIVE

## 2022-08-26 MED ORDER — GABAPENTIN 100 MG PO CAPS
200.0000 mg | ORAL_CAPSULE | Freq: Two times a day (BID) | ORAL | 3 refills | Status: DC
Start: 2022-08-26 — End: 2023-09-13

## 2022-08-26 NOTE — Patient Instructions (Addendum)
Start mucinex to break up thick congestion.   Let me know how you are doing  Mammogram as scheduled

## 2022-08-26 NOTE — Progress Notes (Signed)
Discussed during OV. Please see OV notes

## 2022-08-26 NOTE — Assessment & Plan Note (Signed)
Chronic, stable. Continue gabapentin '200mg'$  BID

## 2022-08-26 NOTE — Progress Notes (Signed)
Subjective:    Patient ID: Isabel [name], female    DOB: 11-05-1991, 31 y.o.   MRN: [id number]  CC: Isabel [name] is a 31 y.o. female who presents today for follow up.   HPI: Complains of sore throat x 4 days, unchanged.   Ear pain has improved.  Endorses sporadic productive cough with clear , thick sputum.  No sob, fever.   Started z pak 4 days ago and no change with sore throat. I had prescribed in September due to ear pain.   She is not taking anti histamine.   Low back pain-unchanged She feels well taking gabapentin 251m BID and feels dose is adequate.  Denies sedation on gabapentin. requests refill.   Dr. CMarcelline Mateshas reordered pelvic ultrasound and it is scheduled for 09/09/2022.  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 Negative for ayptia or malignancy   BREAST BIOPSY Left 09/10/2021   stereo bx "coil" clip-path pending   CHOLECYSTECTOMY     Family History  Problem Relation Age of Onset   Breast cancer Mother 380      currently 49  Hypertension Father    Arthritis Other    Diabetes Other    Cancer Other    Diabetes Maternal Grandmother    Diabetes Maternal Grandfather    Diabetes Paternal Grandmother    Diabetes Paternal Grandfather    Breast cancer Maternal Aunt 56      currently 547 PALB2 mutation   Colon cancer Neg Hx    Heart disease Neg Hx     Allergies: Amoxicillin and Penicillins Current Outpatient  Medications on File Prior to Visit  Medication Sig Dispense Refill   busPIRone (BUSPAR) 5 MG tablet TAKE 1 TABLET IN THE MORNING, 1 TABLET AFTERNOON, TWO AT NIGHTTIME. 360 tablet 3   methocarbamol (ROBAXIN) 500 MG tablet Take 1 tablet (500 mg total) by mouth 2 (two) times daily as needed for muscle spasms. 10 tablet 0   nortriptyline (PAMELOR) 10 MG capsule TAKE 1 CAPSULE BY MOUTH AT BEDTIME 90 capsule 3   rizatriptan (MAXALT) 10 MG tablet Take 1 tablet earliest onset of migraine.  May repeat in 2 hours if needed.  Maximum 2 tablets in 24 hours 10 tablet 5   traMADol (ULTRAM) 50 MG tablet Take 1-2 tablets (50-100 mg total) by mouth every 6 (six) hours as needed. 60 tablet 0   [DISCONTINUED] famotidine (PEPCID) 20 MG tablet Take 1 tablet (20 mg total) by mouth 2 (two) times daily. 60 tablet 0   No current facility-administered medications on file prior to visit.    Social History   Tobacco Use   Smoking status: Every Day    Packs/day: 0.50    Types: Cigarettes   Smokeless tobacco: Never  Vaping Use   Vaping Use: Former  Substance Use Topics   Alcohol use: No   Drug use: Not Currently    Types: Marijuana  Review of Systems  Constitutional:  Negative for chills and fever.  HENT:  Positive for congestion and sore throat.   Respiratory:  Positive for cough. Negative for shortness of breath.   Cardiovascular:  Negative for chest pain and palpitations.  Gastrointestinal:  Negative for nausea and vomiting.      Objective:    BP 118/78 (BP Location: Left Arm, Patient Position: Sitting, Cuff Size: Normal)   Pulse 99   Temp 98.6 F (37 C) (Oral)   Ht '5\' 4"'  (1.626 m)   Wt 188 lb 3.2 oz (85.4 kg)   LMP 08/10/2022   SpO2 99%   BMI 32.30 kg/m  BP Readings from Last 3 Encounters:  08/26/22 118/78  08/14/22 107/77  07/28/22 122/87   Wt Readings from Last 3 Encounters:  08/26/22 188 lb 3.2 oz (85.4 kg)  08/14/22 188 lb (85.3 kg)  07/28/22 180 lb (81.6 kg)    Physical  Exam Vitals reviewed.  Constitutional:      Appearance: She is well-developed.  Eyes:     Conjunctiva/sclera: Conjunctivae normal.  Cardiovascular:     Rate and Rhythm: Normal rate and regular rhythm.     Pulses: Normal pulses.     Heart sounds: Normal heart sounds.  Pulmonary:     Effort: Pulmonary effort is normal.     Breath sounds: Normal breath sounds. No wheezing, rhonchi or rales.  Skin:    General: Skin is warm and dry.  Neurological:     Mental Status: She is alert.  Psychiatric:        Speech: Speech normal.        Behavior: Behavior normal.        Thought Content: Thought content normal.        Assessment & Plan:   Problem List Items Addressed This Visit       Respiratory   Pharyngitis - Primary    Afebrile.  Patient has been taking Z-Pak which she completes tomorrow.  Point-of-care strep throat negative   Advised Mucinex to help break up thick congestion.  She will let me know how she is doing.       Relevant Orders   POCT rapid strep A (Completed)     Other   LOW BACK PAIN    Chronic, stable. Continue gabapentin 281m BID       Relevant Medications   gabapentin (NEURONTIN) 100 MG capsule   Other Visit Diagnoses     Abnormal mammogram       Relevant Orders   MM DIAG BREAST TOMO BILATERAL   [place]AST LTD UNI LEFT INC AXILLA        I have changed [address] gabapentin. I am also having her maintain her busPIRone, methocarbamol, traMADol, rizatriptan, and nortriptyline.   Meds ordered this encounter  Medications   gabapentin (NEURONTIN) 100 MG capsule    Sig: Take 2 capsules (200 mg total) by mouth 2 (two) times daily.    Dispense:  360 capsule    Refill:  3    Order Specific Question:   Supervising Provider    Answer:   TCrecencio Mc[[id number]]    Return precautions given.   Risks, benefits, and alternatives of the medications and treatment plan prescribed today were discussed, and patient expressed understanding.    Education regarding symptom management and diagnosis given to patient on AVS.  Continue to follow with ABurnard Hawthorne FNP for routine health maintenance.   CAndrez Grimeand I agreed  with plan.   Mable Paris, FNP

## 2022-08-26 NOTE — Progress Notes (Signed)
Need refill on Gabapentin

## 2022-08-26 NOTE — Assessment & Plan Note (Addendum)
Afebrile.  Patient has been taking Z-Pak which she completes tomorrow.  Point-of-care strep throat negative   Advised Mucinex to help break up thick congestion.  She will let me know how she is doing.

## 2022-09-01 ENCOUNTER — Encounter: Payer: Self-pay | Admitting: Family

## 2022-09-01 ENCOUNTER — Other Ambulatory Visit: Payer: Self-pay | Admitting: Family

## 2022-09-01 DIAGNOSIS — J029 Acute pharyngitis, unspecified: Secondary | ICD-10-CM

## 2022-09-01 MED ORDER — DOXYCYCLINE HYCLATE 100 MG PO TABS: 100.0000 mg | ORAL_TABLET | Freq: Two times a day (BID) | ORAL | 0 refills | Status: AC

## 2022-09-07 ENCOUNTER — Encounter (INDEPENDENT_AMBULATORY_CARE_PROVIDER_SITE_OTHER): Payer: Self-pay

## 2022-09-09 ENCOUNTER — Other Ambulatory Visit: Payer: BC Managed Care – PPO

## 2022-09-16 ENCOUNTER — Ambulatory Visit
Admission: RE | Admit: 2022-09-16 | Discharge: 2022-09-16 | Disposition: A | Discharge status: Home / Self Care | Payer: PRIVATE HEALTH INSURANCE | Source: Ambulatory Visit | Attending: Obstetrics and Gynecology | Admitting: Obstetrics and Gynecology

## 2022-09-16 DIAGNOSIS — N83201 Unspecified ovarian cyst, right side: Secondary | ICD-10-CM | POA: Insufficient documentation

## 2022-09-24 ENCOUNTER — Ambulatory Visit
Admission: RE | Admit: 2022-09-24 | Discharge: 2022-09-24 | Disposition: A | Discharge status: Home / Self Care | Payer: PRIVATE HEALTH INSURANCE | Source: Ambulatory Visit | Attending: Family | Admitting: Family

## 2022-09-24 ENCOUNTER — Other Ambulatory Visit: Payer: Self-pay | Admitting: Family

## 2022-09-24 DIAGNOSIS — R928 Other abnormal and inconclusive findings on diagnostic imaging of breast: Secondary | ICD-10-CM

## 2022-09-30 ENCOUNTER — Encounter: Payer: Self-pay | Admitting: Family

## 2022-09-30 ENCOUNTER — Ambulatory Visit (INDEPENDENT_AMBULATORY_CARE_PROVIDER_SITE_OTHER): Payer: PRIVATE HEALTH INSURANCE | Admitting: Family

## 2022-09-30 VITALS — BP 120/76 | HR 82 | Temp 98.1°F | Wt 193.6 lb

## 2022-09-30 DIAGNOSIS — N643 Galactorrhea not associated with childbirth: Secondary | ICD-10-CM

## 2022-09-30 DIAGNOSIS — N946 Dysmenorrhea, unspecified: Secondary | ICD-10-CM

## 2022-09-30 LAB — COMPREHENSIVE METABOLIC PANEL
ALT: 9 U/L (ref 0–35)
AST: 10 U/L (ref 0–37)
Albumin: 4 g/dL (ref 3.5–5.2)
Alkaline Phosphatase: 84 U/L (ref 39–117)
BUN: 9 mg/dL (ref 6–23)
CO2: 32 mEq/L (ref 19–32)
Calcium: 9 mg/dL (ref 8.4–10.5)
Chloride: 100 mEq/L (ref 96–112)
Creatinine, Ser: 0.7 mg/dL (ref 0.40–1.20)
GFR: 115.46 mL/min (ref >60.00)
Glucose, Bld: 82 mg/dL (ref 70–99)
Potassium: 4.5 mEq/L (ref 3.5–5.1)
Sodium: 138 mEq/L (ref 135–145)
Total Bilirubin: 0.3 mg/dL (ref 0.2–1.2)
Total Protein: 6.6 g/dL (ref 6.0–8.3)

## 2022-09-30 LAB — CBC WITH DIFFERENTIAL/PLATELET
Basophils Absolute: 0 10*3/uL (ref 0.0–0.1)
Basophils Relative: 0.5 % (ref 0.0–3.0)
Eosinophils Absolute: 0.2 10*3/uL (ref 0.0–0.7)
Eosinophils Relative: 2.3 % (ref 0.0–5.0)
HCT: 38.9 % (ref 36.0–46.0)
Hemoglobin: 13.2 g/dL (ref 12.0–15.0)
Lymphocytes Relative: 24.8 % (ref 12.0–46.0)
Lymphs Abs: 2.1 10*3/uL (ref 0.7–4.0)
MCHC: 34 g/dL (ref 30.0–36.0)
MCV: 89.6 fl (ref 78.0–100.0)
Monocytes Absolute: 0.7 10*3/uL (ref 0.1–1.0)
Monocytes Relative: 8.2 % (ref 3.0–12.0)
Neutro Abs: 5.3 10*3/uL (ref 1.4–7.7)
Neutrophils Relative %: 64.2 % (ref 43.0–77.0)
Platelets: 288 10*3/uL (ref 150.0–400.0)
RBC: 4.34 Mil/uL (ref 3.87–5.11)
RDW: 14 % (ref 11.5–15.5)
WBC: 8.3 10*3/uL (ref 4.0–10.5)

## 2022-09-30 LAB — TSH: TSH: 2.08 u[IU]/mL (ref 0.35–5.50)

## 2022-09-30 NOTE — Assessment & Plan Note (Addendum)
Dr Marcelline Mates repeated transvaginal ultrasound 09/18/2022 normal, resolution of cystic lesion.  I strongly advised patient to make a follow-up with Dr. Marcelline Mates to discuss irregular menses.  She verbalized understanding.

## 2022-09-30 NOTE — Patient Instructions (Signed)
I have ordered a thorough lab evaluation.  I would also like for you to reach out Dr. Marcelline Mates in regards to irregular periods.  I have also ordered a bilateral breast MRI. Let us know if you dont hear back within a week in regards to an appointment being scheduled.   So that you are aware, if you are [facility] MyChart user , please pay attention to your MyChart messages as you may receive a MyChart message with a phone number to call and schedule this test/appointment own your own from our referral coordinator. This is a new process so I do not want you to miss this message.  If you are not a MyChart user, you will receive a phone call.

## 2022-09-30 NOTE — Assessment & Plan Note (Addendum)
Family h/o breast cancer. Benign exam today. Although reassuring mammogram, ultrasound we opted to order contrast-enhanced MRI to evaluate for invasive cancers as well as benign papillary lesions. Pending thorough lab evaluation.

## 2022-09-30 NOTE — Progress Notes (Signed)
Subjective:    Patient ID: Isabel [name], female    DOB: 1991-06-21, 31 y.o.   MRN: [id number]  CC: Isabel [name] is a 31 y.o. female who presents today for an acute visit.    HPI: Bilateral white to clear spontaneous nipple discharge x 2 weeks. More noticeable in left breast. Occurred when washing breasts or rubs across breast.  Discharge is not bloody.  No skin changes, breast masses.  Menses are irregular. Last menses was very light and lasted couple of days 3 weeks ago.    Repeat US 09/16/22 normal, resolution of cystic lesion.   Sexually active.   09/24/22 Mammogram BL and BL ultrasound without suspicious masses. Suspected fibroglandular tissue.   Mother history of breast cancer at 18 years of age. 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 Negative for ayptia or malignancy   BREAST BIOPSY Left 09/10/2021   stereo bx "coil" clip-BENIGN BREAST TISSUE WITH PSEUDOANGIOMATOUS STROMAL HYPERPLASIA. - NEGATIVE FOR ATYPIA AND MALIGNANCY   CHOLECYSTECTOMY     Family History  Problem Relation Age of Onset   Breast cancer Mother 10       currently 68   Hypertension Father    Arthritis Other    Diabetes Other    Cancer Other    Diabetes Maternal Grandmother    Diabetes Maternal Grandfather    Diabetes Paternal Grandmother    Diabetes Paternal Grandfather    Breast cancer Maternal Aunt 75       currently 9; PALB2 mutation   Colon cancer Neg Hx    Heart disease Neg Hx      Allergies: Amoxicillin and Penicillins Current Outpatient Medications on File Prior to Visit  Medication Sig Dispense Refill   busPIRone (BUSPAR) 5 MG tablet TAKE 1 TABLET IN THE MORNING, 1 TABLET AFTERNOON, TWO AT NIGHTTIME. 360 tablet 3   gabapentin (NEURONTIN) 100 MG capsule Take 2 capsules (200 mg total) by mouth 2 (two) times daily. 360 capsule 3   methocarbamol (ROBAXIN) 500 MG tablet Take 1 tablet (500 mg total) by mouth 2 (two) times daily as needed for muscle spasms. 10 tablet 0   nortriptyline (PAMELOR) 10 MG capsule TAKE 1 CAPSULE BY MOUTH AT BEDTIME 90 capsule 3   rizatriptan (MAXALT) 10 MG tablet Take 1 tablet earliest onset of migraine.  May repeat in 2 hours if needed.  Maximum 2 tablets in 24 hours 10 tablet 5   traMADol (ULTRAM) 50 MG tablet Take 1-2 tablets (50-100 mg total) by mouth every 6 (six) hours as needed. 60 tablet 0   [DISCONTINUED] famotidine (PEPCID) 20 MG tablet Take 1 tablet (20 mg total) by mouth 2 (two) times daily. 60 tablet 0   No current facility-administered medications on file prior to visit.    Social History   Tobacco Use   Smoking status: Every Day    Packs/day: 0.50    Types: Cigarettes   Smokeless tobacco: Never  Vaping Use   Vaping Use: Former  Substance Use Topics   Alcohol use: No   Drug use: Not Currently    Types: Marijuana    Review of Systems  Constitutional:  Negative for chills and fever.  Respiratory:  Negative for cough.   Cardiovascular:  Negative for chest pain and palpitations.  Gastrointestinal:  Negative for nausea and vomiting.  Genitourinary:  Positive for menstrual problem.      Objective:    BP 120/76 (BP Location: Left Arm, Patient Position: Sitting, Cuff Size: Normal)   Pulse 82   Temp 98.1 F (36.7 C) (Oral)   Wt 193 lb 9.6 oz (87.8 kg)   LMP 09/17/2022   SpO2 95%   BMI 33.23 kg/m    Physical Exam Vitals reviewed.  Constitutional:      Appearance: She is well-developed.  Eyes:      Conjunctiva/sclera: Conjunctivae normal.  Cardiovascular:     Rate and Rhythm: Normal rate and regular rhythm.     Pulses: Normal pulses.     Heart sounds: Normal heart sounds.  Pulmonary:     Effort: Pulmonary effort is normal.     Breath sounds: Normal breath sounds. No wheezing, rhonchi or rales.  Chest:  Breasts:    Right: No swelling, bleeding, inverted nipple, mass, nipple discharge, skin change or tenderness.     Left: No swelling, bleeding, inverted nipple, mass, nipple discharge, skin change or tenderness.  Skin:    General: Skin is warm and dry.  Neurological:     Mental Status: She is alert.  Psychiatric:        Speech: Speech normal.        Behavior: Behavior normal.        Thought Content: Thought content normal.        Assessment & Plan:   Problem List Items Addressed This Visit       Genitourinary   Dysmenorrhea    Dr Cherry repeated transvaginal ultrasound 09/18/2022 normal, resolution of cystic lesion.  I strongly advised patient to make a follow-up with Dr. Cherry to discuss irregular menses.  She verbalized understanding.        Other   Galactorrhea - Primary    Family h/o breast cancer. Benign exam today. Although reassuring mammogram, ultrasound we opted to order contrast-enhanced MRI to evaluate for invasive cancers as well as benign papillary lesions. Pending thorough lab evaluation.       Relevant Orders   hCG, serum, qualitative   Prolactin   TSH   MR BREAST BILATERAL W WO CONTRAST INC CAD   CBC with Differential/Platelet   Comprehensive metabolic panel      I am having Isabel [name] maintain her busPIRone, methocarbamol, traMADol, rizatriptan, nortriptyline, and gabapentin.   No orders of the defined types were placed in this encounter.   Return precautions given.   Risks, benefits, and alternatives of the medications and treatment plan prescribed today were discussed, and patient expressed understanding.   Education  regarding symptom management and diagnosis given to patient on AVS.  Continue to follow with ,  G, FNP for routine health maintenance.   Isabel [name] and I agreed with plan.    , FNP  

## 2022-10-01 LAB — HCG, SERUM, QUALITATIVE: Preg, Serum: NEGATIVE

## 2022-10-01 LAB — PROLACTIN: Prolactin: 6.8 ng/mL

## 2022-10-02 ENCOUNTER — Telehealth: Payer: Self-pay | Admitting: Family

## 2022-10-02 NOTE — Telephone Encounter (Signed)
Lft pt vm to call ofc . thanks 

## 2022-10-05 ENCOUNTER — Other Ambulatory Visit: Payer: Self-pay | Admitting: Family

## 2022-10-05 ENCOUNTER — Telehealth: Payer: Self-pay | Admitting: Family

## 2022-10-05 DIAGNOSIS — F40240 Claustrophobia: Secondary | ICD-10-CM

## 2022-10-05 MED ORDER — ALPRAZOLAM 0.5 MG PO TABS: 0.5000 mg | ORAL_TABLET | Freq: Once | ORAL | 0 refills | Status: AC

## 2022-10-05 NOTE — Telephone Encounter (Signed)
I have sent in xanax  I looked up patient on [place] Controlled Substances Reporting System PMP AWARE and saw no activity that raised concern of inappropriate use.

## 2022-10-05 NOTE — Telephone Encounter (Signed)
Call pt  MRI breasts is scheduled   Rasheedah mentioned patient needed something called in for her  Please get more info  Is this relating to anxiety? Message wasn't clear

## 2022-10-05 NOTE — Telephone Encounter (Signed)
Spoke to pt and informed her that Rx for Xanax has been filled

## 2022-10-05 NOTE — Telephone Encounter (Signed)
Pt is scheduled on 10/12/22 to have her MRI. Pt states she needs something to be called into the pharmacy.   [id number] [place]         Phone: [phone]:     [phone]    Thank you!

## 2022-10-12 ENCOUNTER — Ambulatory Visit: Admission: RE | Admit: 2022-10-12 | Payer: BC Managed Care – PPO | Source: Ambulatory Visit

## 2022-10-14 ENCOUNTER — Ambulatory Visit
Admission: RE | Admit: 2022-10-14 | Discharge: 2022-10-14 | Disposition: A | Discharge status: Home / Self Care | Payer: PRIVATE HEALTH INSURANCE | Source: Ambulatory Visit | Attending: Family | Admitting: Family

## 2022-10-14 DIAGNOSIS — N643 Galactorrhea not associated with childbirth: Secondary | ICD-10-CM | POA: Diagnosis not present

## 2022-10-14 MED ORDER — GADOBUTROL 1 MMOL/ML IV SOLN: 9.0000 mL | Freq: Once | INTRAVENOUS | Status: DC | PRN

## 2022-10-14 MED ORDER — GADOBUTROL 1 MMOL/ML IV SOLN
7.5000 mL | Freq: Once | INTRAVENOUS | Status: AC | PRN
  Administered 2022-10-14: 7.5 mL via INTRAVENOUS

## 2022-10-26 ENCOUNTER — Encounter: Payer: Self-pay | Admitting: Family

## 2022-11-13 ENCOUNTER — Encounter: Payer: Self-pay | Admitting: Family

## 2022-12-08 ENCOUNTER — Ambulatory Visit (INDEPENDENT_AMBULATORY_CARE_PROVIDER_SITE_OTHER): Payer: BC Managed Care – PPO | Admitting: Family

## 2022-12-08 ENCOUNTER — Encounter: Payer: Self-pay | Admitting: Family

## 2022-12-08 VITALS — BP 128/80 | HR 80 | Temp 98.2°F | Ht 64.0 in | Wt 194.8 lb

## 2022-12-08 DIAGNOSIS — J029 Acute pharyngitis, unspecified: Secondary | ICD-10-CM

## 2022-12-08 LAB — POCT RAPID STREP A (OFFICE): Rapid Strep A Screen: NEGATIVE

## 2022-12-08 MED ORDER — AZITHROMYCIN 250 MG PO TABS: ORAL_TABLET | ORAL | 0 refills | Status: AC

## 2022-12-08 NOTE — Addendum Note (Signed)
Addended by: [place], [place] Hackenberg on: 12/08/2022 03:33 PM   Modules accepted: Orders

## 2022-12-08 NOTE — Patient Instructions (Signed)
Start zpak Will await throat culture  Ensure to take probiotics while on antibiotics and also for 2 weeks after completion. This can either be by eating yogurt daily or taking a probiotic supplement over the counter such as Culturelle.It is important to re-colonize the gut with good bacteria and also to prevent any diarrheal infections associated with antibiotic use.   Sore Throat A sore throat is pain, burning, irritation, or scratchiness in the throat. When you have a sore throat, you may feel pain or tenderness in your throat when you swallow or talk. Many things can cause a sore throat, including: An infection. Seasonal allergies. Dryness in the air. Irritants, such as smoke or pollution. Radiation treatment for cancer. Gastroesophageal reflux disease (GERD). A tumor. A sore throat is often the first sign of another sickness. It may happen with other symptoms, such as coughing, sneezing, fever, and swollen neck glands. Most sore throats go away without medical treatment. Follow these instructions at home:     Medicines Take over-the-counter and prescription medicines only as told by your health care provider. Children often get sore throats. Do not give your child aspirin because of the association with Reye's syndrome. Use throat sprays to soothe your throat as told by your health care provider. Managing pain To help with pain, try: Sipping warm liquids, such as broth, herbal tea, or warm water. Eating or drinking cold or frozen liquids, such as frozen ice pops. Gargling with a mixture of salt and water 3-4 times a day or as needed. To make salt water, completely dissolve -1 tsp (3-6 g) of salt in 1 cup (237 mL) of warm water. Sucking on hard candy or throat lozenges. Putting a cool-mist humidifier in your bedroom at night to moisten the air. Sitting in the bathroom with the door closed for 5-10 minutes while you run hot water in the shower. General instructions Do not use any  products that contain nicotine or tobacco. These products include cigarettes, chewing tobacco, and vaping devices, such as e-cigarettes. If you need help quitting, ask your health care provider. Rest as needed. Drink enough fluid to keep your urine pale yellow. Wash your hands often with soap and water for at least 20 seconds. If soap and water are not available, use hand sanitizer. Contact a health care provider if: You have a fever for more than 2-3 days. You have symptoms that last for more than 2-3 days. Your throat does not get better within 7 days. You have a fever and your symptoms suddenly get worse. Get help right away if: You have difficulty breathing. You cannot swallow fluids, soft foods, or your saliva. You have increased swelling in your throat or neck. You have persistent nausea and vomiting. These symptoms may represent a serious problem that is an emergency. Do not wait to see if the symptoms will go away. Get medical help right away. Call your local emergency services (911 in the [place] Do not drive yourself to the hospital. Summary A sore throat is pain, burning, irritation, or scratchiness in the throat. Many things can cause a sore throat. Take over-the-counter medicines only as told by your health care provider. Rest as needed. Drink enough fluid to keep your urine pale yellow. Contact a health care provider if your throat does not get better within 7 days. This information is not intended to replace advice given to you by your health care provider. Make sure you discuss any questions you have with your health care provider. Document Revised:  01/22/2021 Document Reviewed: 01/22/2021 Elsevier Patient Education  [place].

## 2022-12-08 NOTE — Assessment & Plan Note (Addendum)
Intense throat pain.Point-of-care strep is negative.  Patient feels pain reminds her of previous strep pharyngitis.  We opted to treat her with azithromycin as she is PCN allergic.  Pending throat culture at this time.

## 2022-12-08 NOTE — Progress Notes (Signed)
Assessment & Plan:  Pharyngitis, unspecified etiology Assessment & Plan: Intense throat pain.Point-of-care strep is negative.  Patient feels pain reminds her of previous strep pharyngitis.  We opted to treat her with azithromycin as she is PCN allergic.  Pending throat culture at this time.  Orders: -     Azithromycin; Take 2 tablets on day 1, then 1 tablet daily on days 2 through 5  Dispense: 6 tablet; Refill: 0  Sore throat -     POCT rapid strep A -     Culture, Group A Strep     Return precautions given.   Risks, benefits, and alternatives of the medications and treatment plan prescribed today were discussed, and patient expressed understanding.   Education regarding symptom management and diagnosis given to patient on AVS either electronically or printed.  No follow-ups on file.  Mable Paris, FNP  Subjective:    Patient ID: Isabel [name], female    DOB: [birth year], 32 y.o.   MRN: [id number]  CC: Isabel [name] is a 32 y.o. female who presents today for an acute visit.    HPI: Complains of bilateral sore throat x 4 days Intense pain and very painful to eat or drink  She is doing salt water gargles without relief  Works in nursing home.   Endorses chills  No fever, cough, sob, ear pain.        Allergies: Amoxicillin and Penicillins Current Outpatient Medications on File Prior to Visit  Medication Sig Dispense Refill   busPIRone (BUSPAR) 5 MG tablet TAKE 1 TABLET IN THE MORNING, 1 TABLET AFTERNOON, TWO AT NIGHTTIME. 360 tablet 3   gabapentin (NEURONTIN) 100 MG capsule Take 2 capsules (200 mg total) by mouth 2 (two) times daily. 360 capsule 3   methocarbamol (ROBAXIN) 500 MG tablet Take 1 tablet (500 mg total) by mouth 2 (two) times daily as needed for muscle spasms. 10 tablet 0   nortriptyline (PAMELOR) 10 MG capsule TAKE 1 CAPSULE BY MOUTH AT BEDTIME 90 capsule 3   rizatriptan (MAXALT) 10 MG tablet Take 1 tablet earliest onset of migraine.  May  repeat in 2 hours if needed.  Maximum 2 tablets in 24 hours 10 tablet 5   traMADol (ULTRAM) 50 MG tablet Take 1-2 tablets (50-100 mg total) by mouth every 6 (six) hours as needed. 60 tablet 0   [DISCONTINUED] famotidine (PEPCID) 20 MG tablet Take 1 tablet (20 mg total) by mouth 2 (two) times daily. 60 tablet 0   No current facility-administered medications on file prior to visit.    Review of Systems  Constitutional:  Negative for chills and fever.  HENT:  Positive for sore throat and voice change. Negative for congestion.   Respiratory:  Negative for cough and shortness of breath.   Cardiovascular:  Negative for chest pain and palpitations.  Gastrointestinal:  Negative for nausea and vomiting.      Objective:    BP 128/80   Pulse 80   Temp 98.2 F (36.8 C) (Oral)   Ht '5\' 4"'$  (1.626 m)   Wt 194 lb 12.8 oz (88.4 kg)   LMP  (LMP Unknown)   SpO2 96%   BMI 33.44 kg/m   BP Readings from Last 3 Encounters:  12/08/22 128/80  09/30/22 120/76  08/26/22 118/78   Wt Readings from Last 3 Encounters:  12/08/22 194 lb 12.8 oz (88.4 kg)  09/30/22 193 lb 9.6 oz (87.8 kg)  08/26/22 188 lb 3.2 oz (85.4 kg)  Physical Exam Vitals reviewed.  Constitutional:      Appearance: She is well-developed.  HENT:     Head: Normocephalic and atraumatic.     Right Ear: Hearing, tympanic membrane, ear canal and external ear normal. No decreased hearing noted. No drainage, swelling or tenderness. No middle ear effusion. No foreign body. Tympanic membrane is not erythematous or bulging.     Left Ear: Hearing, tympanic membrane, ear canal and external ear normal. No decreased hearing noted. No drainage, swelling or tenderness.  No middle ear effusion. No foreign body. Tympanic membrane is not erythematous or bulging.     Nose: Nose normal. No rhinorrhea.     Right Sinus: No maxillary sinus tenderness or frontal sinus tenderness.     Left Sinus: No maxillary sinus tenderness or frontal sinus tenderness.      Mouth/Throat:     Pharynx: Uvula midline. Posterior oropharyngeal erythema present. No oropharyngeal exudate.     Tonsils: No tonsillar abscesses.     Comments: Tonsils well behind pilars.  No white exudate Eyes:     Conjunctiva/sclera: Conjunctivae normal.  Cardiovascular:     Rate and Rhythm: Regular rhythm.     Pulses: Normal pulses.     Heart sounds: Normal heart sounds.  Pulmonary:     Effort: Pulmonary effort is normal.     Breath sounds: Normal breath sounds. No wheezing, rhonchi or rales.  Lymphadenopathy:     Head:     Right side of head: No submental, submandibular, tonsillar, preauricular, posterior auricular or occipital adenopathy.     Left side of head: No submental, submandibular, tonsillar, preauricular, posterior auricular or occipital adenopathy.     Cervical: No cervical adenopathy.  Skin:    General: Skin is warm and dry.  Neurological:     Mental Status: She is alert.  Psychiatric:        Speech: Speech normal.        Behavior: Behavior normal.        Thought Content: Thought content normal.

## 2022-12-10 LAB — CULTURE, GROUP A STREP
MICRO NUMBER:: 14493111
SPECIMEN QUALITY:: ADEQUATE

## 2022-12-11 ENCOUNTER — Ambulatory Visit: Payer: PRIVATE HEALTH INSURANCE | Admitting: Family

## 2022-12-21 ENCOUNTER — Encounter: Payer: Self-pay | Admitting: Family

## 2022-12-22 ENCOUNTER — Ambulatory Visit: Payer: PRIVATE HEALTH INSURANCE | Admitting: Family

## 2023-01-27 ENCOUNTER — Ambulatory Visit: Payer: No Typology Code available for payment source | Admitting: Family

## 2023-01-27 ENCOUNTER — Encounter: Payer: Self-pay | Admitting: Family

## 2023-01-27 ENCOUNTER — Other Ambulatory Visit: Payer: Self-pay | Admitting: Family

## 2023-01-27 VITALS — BP 128/86 | HR 93 | Temp 97.4°F | Ht 64.0 in | Wt 196.6 lb

## 2023-01-27 DIAGNOSIS — S20362A Insect bite (nonvenomous) of left front wall of thorax, initial encounter: Secondary | ICD-10-CM

## 2023-01-27 DIAGNOSIS — W57XXXA Bitten or stung by nonvenomous insect and other nonvenomous arthropods, initial encounter: Secondary | ICD-10-CM

## 2023-01-27 DIAGNOSIS — F419 Anxiety disorder, unspecified: Secondary | ICD-10-CM

## 2023-01-27 MED ORDER — BUSPIRONE HCL 10 MG PO TABS: 10.0000 mg | ORAL_TABLET | Freq: Three times a day (TID) | ORAL | 3 refills | Status: DC

## 2023-01-27 NOTE — Assessment & Plan Note (Addendum)
Patient and I had identified tick as Lonestar tick based on white spot.  no erythema migrans ( EM) .  Pruritus is not typically associated with EM. Fortunately no fever ,chills headache ,enlarged lymph nodes at this time.  Discussed with patient itching and advised topical Benadryl .  We spent time discussing prophylaxis for including attached tick is identified as an adult or nymphal I. scapularis tick (deer tick), Tick is estimated to have been attached for ?36 hours.   We also discussed Lyme disease is not endemic to [facility] Per CDC chart.  We discussed lonestar tick does not cause lyme disease per CDC.   Presentation is not c/w with STARI with EM from lonestar tick at this time.  Strict return precautions the patient was provided today and advised her to let me know right away if she would develop a bull's-eye rash, fever chills, headache or enlarged lymph nodes within 30 days of tick bite which would warrant treatment with doxycyline.

## 2023-01-27 NOTE — Patient Instructions (Addendum)
As discussed, please remain vigilant for rash, enlarged lymph nodes, fever chills body aches or headache in the next 30 days after tick bite.   I have included a couple websites below that I would like for you to reference from [facility]  [email or web address]  Let's follow these tick bites with close observation. Information below on Lyme disease and Tick bites in general.   If there is no improvement in your symptoms, or if there is any worsening of symptoms, or if you have any additional concerns, please return for re-evaluation; or, if we are closed, consider going to the Emergency Room for evaluation if symptoms urgent.  Patient education: Lyme disease (The Basics)[place] in [place]  Written by the doctors and editors at UpToDate  What is Lyme disease? -- Lyme disease is an illness that can make you feel like you have the flu. It can also cause a rash, fever, or nerve, joint, or heart problems. People can get Lyme disease after being bitten by a tiny insect called a tick. When a certain type of tick bites you, it can transmit the germ that causes Lyme disease from its body to yours. But a tick can infect you only if it stays attached for at least a day. The ticks that carry Lyme disease feed on deer and mice. Ticks are found in tall grass and on shrubs, and can attach to animals and people walking by. Ticks cannot fly or jump. What are the symptoms of Lyme disease? -- Symptoms can start days or weeks after a tick bite. They include: ?A rash where you were bitten - The rash often appears within a month of getting bitten. It is red, but its center can be the color of your skin. It might get bigger over a few days. To some, it looks like a "bull's eye" (picture 1). ?Fever ?Feeling tired ?Body aches and pains ?Heart problems such as a slowed heart rate ?Headache and stiff neck ?Feelings of pain, weakness, or numbness If a person is not treated, further symptoms can occur  months to years after a tick bite. These include: ?Pain and swelling of joints, such as your knees ?Trouble with your memory and thinking ?Skin problems, such as skin swelling or thinning (this occurs mostly in [place]) Is there a test for Lyme disease? -- Yes. Blood tests can show if you are infected with the germ that causes Lyme disease. But, it takes time for the blood tests to turn positive. This means the tests won't work if you get them right after being bitten. Also, sometimes the blood tests come back negative even when you have the rash that goes with Lyme disease. Because of this, if you have the rash, the blood test is not needed to confirm that you have Lyme disease. If your doctor or nurse suspects you have Lyme disease, he or she will do an exam and ask you questions. The doctor or nurse will use this information (and your blood test result, if needed) to decide about treatment. What should I do if I get bitten by a tick or if my child gets bitten? -- If you find a tick on your body or on your child, use tweezers to grab it. Then pull it out slowly and gently. After that, wash the area with soap and water. You do not need to keep the tick. But knowing what it looked like can help your doctor decide about your treatment. See if you can tell: ?Its color and size ?  If it was attached to your skin or just resting on your skin ?If it was big, round, and full of blood (picture 2) You should watch the area around the bite for a month to see if a rash occurs. Should I see a doctor or nurse? -- See your doctor or nurse if you have a tick and you cannot get it off or if you think you have had a tick attached for at least 36 hours (a day and a half). You should also see a doctor or nurse if you develop symptoms of Lyme disease. Some people don't know that they were bitten by a tick. Or they might not remember having a rash or early symptoms of Lyme disease. How is Lyme disease treated? -- Lyme disease  is usually treated with antibiotics. Treatment with antibiotics should help your symptoms go away. Sometimes, symptoms improve quickly. Other times, it can take weeks or months for symptoms to go away. Your doctor might prescribe medicine for you to take right after a tick bite. Or your doctor might wait to see if you first develop symptoms. Either way, the medicine will treat your Lyme disease. What can I do to try to avoid getting bitten by a tick? -- You can: ?Wear shoes, long-sleeved shirts, and long pants when you go outside. Keep ticks away from your skin by tucking your pants into your socks. ?Wear light colors so you can spot any ticks that get on your clothes ?Use bug sprays to keep ticks off your skin or clothes ?Shower within 2 hours of being outdoors if you think you have been in an area where there are ticks ?Check your clothes and body for ticks after being outdoors. Be sure to check your scalp, waist, armpits, groin, and backs of your knees. Check your children, too. ?If you live in a place that has deer or mice nearby, take steps to keep those animals away. Deer and mice carry ticks.   Tick Bite Information Ticks are insects that attach themselves to the skin and draw blood for food. There are various types of ticks. Common types include wood ticks and deer ticks. Most ticks live in shrubs and grassy areas. Ticks can climb onto your body when you make contact with leaves or grass where the tick is waiting. The most common places on the body for ticks to attach themselves are the scalp, neck, armpits, waist, and groin. Most tick bites are harmless, but sometimes ticks carry germs that cause diseases. These germs can be spread to a person during the tick's feeding process. The chance of a disease spreading through a tick bite depends on:  The type of tick. Time of year.   How long the tick is attached.   Geographic location.   HOW CAN YOU PREVENT TICK BITES? Take these steps to help  prevent tick bites when you are outdoors: Wear protective clothing. Long sleeves and long pants are best.   Wear white clothes so you can see ticks more easily. Tuck your pant legs into your socks.   If walking on a trail, stay in the middle of the trail to avoid brushing against bushes. Avoid walking through areas with long grass.  Put insect repellent on all exposed skin and along boot tops, pant legs, and sleeve cuffs.   Check clothing, hair, and skin repeatedly and before going inside.   Brush off any ticks that are not attached. Take a shower or bath as soon as possible after  being outdoors.    WHAT IS THE PROPER WAY TO REMOVE A TICK? Ticks should be removed as soon as possible to help prevent diseases caused by tick bites. If latex gloves are available, put them on before trying to remove a tick.   Using fine-point tweezers, grasp the tick as close to the skin as possible. You may also use curved forceps or a tick removal tool. Grasp the tick as close to its head as possible. Avoid grasping the tick on its body. Pull gently with steady upward pressure until the tick lets go. Do not twist the tick or jerk it suddenly. This may break off the tick's head or mouth parts. Do not squeeze or crush the tick's body. This could force disease-carrying fluids from the tick into your body.   After the tick is removed, wash the bite area and your hands with soap and water or other disinfectant such as alcohol. Apply a small amount of antiseptic cream or ointment to the bite site.   Wash and disinfect any instruments that were used.   Do not try to remove a tick by applying a hot match, petroleum jelly, or fingernail polish to the tick. These methods do not work and may increase the chances of disease being spread from the tick bite.  WHEN SHOULD YOU SEEK MEDICAL CARE? Contact your health care provider if you are unable to remove a tick from your skin or if a part of the tick breaks off and is stuck in  the skin.  After a tick bite, you need to be aware of signs and symptoms that could be related to diseases spread by ticks. Contact your health care provider if you develop any of the following in the days or weeks after the tick bite: Unexplained fever. Rash. A circular rash that appears days or weeks after the tick bite may indicate the possibility of Lyme disease. The rash may resemble a target with a bull's-eye and may occur at a different part of your body than the tick bite. Redness and swelling in the area of the tick bite.   Tender, swollen lymph glands.   Diarrhea.   Weight loss.   Cough.   Fatigue.   Muscle, joint, or bone pain.   Abdominal pain.   Headache.   Lethargy or a change in your level of consciousness. Difficulty walking or moving your legs.   Numbness in the legs.   Paralysis. Shortness of breath.   Confusion.   Repeated vomiting.     This information is not intended to replace advice given to you by your health care provider. Make sure you discuss any questions you have with your health care provider.   Document Released: 10/23/2000 Document Revised: 11/16/2014 Document Reviewed: 04/05/2013 Elsevier Interactive Patient Education 2016 [employer].  Please download Myfitness Pal App ( free). You may log every thing you eat for even 2-3 days to get a better of idea of true daily calories. To loose weight, you have to use more calories than than consumed and essentially create caloric deficit to loose weight. The goal is 1-2 lbs per week of weight loss.   You review below from [facility].   [email or web address]  Calorie counting made easy  Eat less, exercise more. If only it were that simple! As most dieters know, losing weight can be very challenging. As this report details, a range of influences can affect how people gain and lose weight. But a basic understanding of how to  tip your energy balance in  favor of weight loss is a good place to start.  Start by determining how many calories you should consume each day. To do so, you need to know how many calories you need to maintain your current weight. Doing this requires a few simple calculations.  First, multiply your current weight by 15 -- that's roughly the number of calories per pound of body weight needed to maintain your current weight if you are moderately active. Moderately active means getting at least 30 minutes of physical activity a day in the form of exercise (walking at a brisk pace, climbing stairs, or active gardening). Let's say you're a woman who is 5 feet, 4 inches tall and weighs 155 pounds, and you need to lose about 15 pounds to put you in a healthy weight range. If you multiply 155 by 15, you will get 2,325, which is the number of calories per day that you need in order to maintain your current weight (weight-maintenance calories). To lose weight, you will need to get below that total.  For example, to lose 1 to 2 pounds a week -- a rate that experts consider safe -- your food consumption should provide 500 to 1,000 calories less than your total weight-maintenance calories. If you need 2,325 calories a day to maintain your current weight, reduce your daily calories to between 1,325 and 1,825. If you are sedentary, you will also need to build more activity into your day. In order to lose at least a pound a week, try to do at least 30 minutes of physical activity on most days, and reduce your daily calorie intake by at least 500 calories. However, calorie intake should not fall below 1,200 a day in women or 1,500 a day in men, except under the supervision of a health professional. Eating too few calories can endanger your health by depriving you of needed nutrients.  Meeting your calorie target How can you meet your daily calorie target? One approach is to add up the number of calories per serving of all the foods that you eat, and  then plan your menus accordingly. You can buy books that list calories per serving for many foods. In addition, the nutrition labels on all packaged foods and beverages provide calories per serving information. Make a point of reading the labels of the foods and drinks you use, noting the number of calories and the serving sizes. Many recipes published in cookbooks, newspapers, and magazines provide similar information.  If you hate counting calories, a different approach is to restrict how much and how often you eat, and to eat meals that are low in calories. Dietary guidelines issued by the American Heart Association stress common sense in choosing your foods rather than focusing strictly on numbers, such as total calories or calories from fat. Whichever method you choose, research shows that a regular eating schedule -- with meals and snacks planned for certain times each day -- makes for the most successful approach. The same applies after you have lost weight and want to keep it off. Sticking with an eating schedule increases your chance of maintaining your new weight.    This is  Dr. Lupita Dawn  ( an amazing physician in my office!)  example of a  "Low GI"  Diet:  It will allow you to lose 4 to 8  lbs  per month if you follow it carefully.  Your goal with exercise is a minimum of 30 minutes of aerobic  exercise 5 days per week (Walking does not count once it becomes easy!)    All of the foods can be found at grocery stores and in bulk at [employer].  The Atkins protein bars and shakes are available in more varieties at Target, [employer] and [place].     7 AM Breakfast:  Choose from the following:  Low carbohydrate Protein  Shakes (I recommend the  Premier Protein chocolate shakes,  EAS AdvantEdge "Carb Control" shakes  Or the Atkins shakes all are under 3 net carbs)     a scrambled egg/bacon/cheese burrito made with [employer]'s "carb balance" whole wheat tortilla  (about 10 net carbs )  Dispensing optician (basically a quiche without the pastry crust) that is eaten cold and very convenient way to get your eggs.  8 carbs)  If you make your own protein shakes, avoid bananas and pineapple,  And use low carb greek yogurt or original /unsweetened almond or soy milk    Avoid cereal and bananas, oatmeal and cream of wheat and grits. They are loaded with carbohydrates!   10 AM: high protein snack:  Protein bar by Atkins (the snack size, under 200 cal, usually < 6 net carbs).    A stick of cheese:  Around 1 carb,  100 cal     Dannon Light n Fit [place] Yogurt  (80 cal, 8 carbs)  Other so called "protein bars" and Greek yogurts tend to be loaded with carbohydrates.  Remember, in food advertising, the word "energy" is synonymous for " carbohydrate."  Lunch:   A Sandwich using the bread choices listed, Can use any  Eggs,  lunchmeat, grilled meat or canned tuna), avocado, regular mayo/mustard  and cheese.  A Salad using blue cheese, ranch,  Goddess or vinagrette,  Avoid taco shells, croutons or "confetti" and no "candied nuts" but regular nuts OK.   No pretzels, nabs  or chips.  Pickles and miniature sweet peppers are a good low carb alternative that provide a "crunch"  The bread is the only source of carbohydrate in a sandwich and  can be decreased by trying some of the attached alternatives to traditional loaf bread   Avoid "Low fat dressings, as well as [place] dressings They are loaded with sugar!   3 PM/ Mid day  Snack:  Consider  1 ounce of  almonds, walnuts, pistachios, pecans, peanuts,  Macadamia nuts or a nut medley.  Avoid "granola and granola bars "  Mixed nuts are ok in moderation as long as there are no raisins,  cranberries or dried fruit.   KIND bars are OK if you get the low glycemic index variety   Try the prosciutto/mozzarella cheese sticks by [employer]  In deli /backery section   High protein      6 PM  Dinner:     Meat/fowl/fish with a  green salad, and either broccoli, cauliflower, green beans, spinach, brussel sprouts or  Lima beans. DO NOT BREAD THE PROTEIN!!      There is a low carb pasta by Dreamfield's that is acceptable and tastes great: only 5 digestible carbs/serving.( All grocery stores but BJs carry it ) Several ready made meals are available low carb:   Try Michel Angelo's chicken piccata or chicken or eggplant parm over low carb pasta.(Lowes and BJs)   Marjory Lies Sanchez's "Carnitas" (pulled pork, no sauce,  0 carbs) or his beef pot roast to make a dinner burrito (at [employer])  Pesto over low carb pasta (bj's sells a good quality pesto in the center refrigerated section of the deli   Try satueeing  Cheral Marker with mushroooms as a good side   [employer] makes a mashed cauliflower that tastes like mashed potatoes  Whole wheat pasta is still full of digestible carbs and  Not as low in glycemic index as Dreamfield's.   Brown rice is still rice,  So skip the rice and noodles if you eat [place] or [place] (or at least limit to 1/2 cup)  9 PM snack :   Breyer's "low carb" fudgsicle or  ice cream bar (Carb Smart line), or  Weight Watcher's ice cream bar , or another "no sugar added" ice cream;  a serving of fresh berries/cherries with whipped cream   Cheese or DANNON'S LlGHT N FIT GREEK YOGURT  8 ounces of [place] unsweetened almond/cococunut milk    Treat yourself to a parfait made with whipped cream blueberiies, walnuts and vanilla greek yogurt  Avoid bananas, pineapple, grapes  and watermelon on a regular basis because they are high in sugar.  THINK OF THEM AS DESSERT  Remember that snack Substitutions should be less than 10 NET carbs per serving and meals < 20 carbs. Remember to subtract fiber grams to get the "net carbs."

## 2023-01-27 NOTE — Progress Notes (Signed)
Assessment & Plan:  Tick bite of left front wall of thorax, initial encounter Assessment & Plan: Patient and I had identified tick as Lonestar tick based on white spot.  no erythema migrans ( EM) .  Pruritus is not typically associated with EM. Fortunately no fever ,chills headache ,enlarged lymph nodes at this time.  Discussed with patient itching and advised topical Benadryl .  We spent time discussing prophylaxis for including attached tick is identified as an adult or nymphal I. scapularis tick (deer tick), Tick is estimated to have been attached for 32 hours.   We also discussed Lyme disease is not endemic to [facility] Per CDC chart.  We discussed lonestar tick does not cause lyme disease per CDC.   Presentation is not c/w with STARI with EM from lonestar tick at this time.  Strict return precautions the patient was provided today and advised her to let me know right away if she would develop a bull's-eye rash, fever chills, headache or enlarged lymph nodes within 32 days of tick bite which would warrant treatment with doxycyline.       Return precautions given.   Risks, benefits, and alternatives of the medications and treatment plan prescribed today were discussed, and patient expressed understanding.   Education regarding symptom management and diagnosis given to patient on AVS either electronically or printed.  No follow-ups on file.  Isabel Paris, FNP  Subjective:    Patient ID: Isabel [name], female    DOB: December 16, 1990, 32 y.o.   MRN: [id number]  CC: Isabel [name] is a 32 y.o. female who presents today for an acute visit.    HPI: She pulled a tick off right chest whom must have attached for several hours over night while sleeping.  She had not noticed the tick the day prior.  Describes as itchy.  She has been using topical cortisone cream Tick was attached to right chest and she removed in its entirely early this morning. Tick was not engorged. Tick had  white spot ( Lonestar) .   Denies unusual headache.  Denies joint pain, lymphadenopathy, fever, chills    She remains frustrated by weight gain.  She suspects amitriptyline is contributing.  She is walking daily  Allergies: Amoxicillin and Penicillins Current Outpatient Medications on File Prior to Visit  Medication Sig Dispense Refill   busPIRone (BUSPAR) 5 MG tablet TAKE 1 TABLET IN THE MORNING, 1 TABLET AFTERNOON, TWO AT NIGHTTIME. 360 tablet 3   gabapentin (NEURONTIN) 100 MG capsule Take 2 capsules (200 mg total) by mouth 2 (two) times daily. 360 capsule 3   methocarbamol (ROBAXIN) 500 MG tablet Take 1 tablet (500 mg total) by mouth 2 (two) times daily as needed for muscle spasms. 10 tablet 0   nortriptyline (PAMELOR) 10 MG capsule TAKE 1 CAPSULE BY MOUTH AT BEDTIME 90 capsule 3   rizatriptan (MAXALT) 10 MG tablet Take 1 tablet earliest onset of migraine.  May repeat in 2 hours if needed.  Maximum 2 tablets in 24 hours 10 tablet 5   traMADol (ULTRAM) 50 MG tablet Take 1-2 tablets (50-100 mg total) by mouth every 6 (six) hours as needed. 60 tablet 0   [DISCONTINUED] famotidine (PEPCID) 20 MG tablet Take 1 tablet (20 mg total) by mouth 2 (two) times daily. 60 tablet 0   No current facility-administered medications on file prior to visit.    Review of Systems  Constitutional:  Negative for chills and fever.  Respiratory:  Negative for cough.  Cardiovascular:  Negative for chest pain and palpitations.  Gastrointestinal:  Negative for nausea and vomiting.  Skin:  Positive for rash.  Neurological:  Negative for headaches.      Objective:    BP 128/86   Pulse 93   Temp (!) 97.4 F (36.3 C) (Oral)   Ht 5\' 4"  (1.626 m)   Wt 196 lb 9.6 oz (89.2 kg)   LMP  (LMP Unknown)   SpO2 97%   BMI 33.75 kg/m   BP Readings from Last 3 Encounters:  01/27/23 128/86  12/08/22 128/80  09/30/22 120/76   Wt Readings from Last 3 Encounters:  01/27/23 196 lb 9.6 oz (89.2 kg)  12/08/22 194 lb  12.8 oz (88.4 kg)  09/30/22 193 lb 9.6 oz (87.8 kg)     Physical Exam Vitals reviewed.  Constitutional:      Appearance: She is well-developed.  Eyes:     Conjunctiva/sclera: Conjunctivae normal.  Cardiovascular:     Rate and Rhythm: Normal rate and regular rhythm.     Pulses: Normal pulses.     Heart sounds: Normal heart sounds.  Pulmonary:     Effort: Pulmonary effort is normal.     Breath sounds: Normal breath sounds. No wheezing, rhonchi or rales.  Lymphadenopathy:     Head:     Right side of head: No submental, submandibular, tonsillar, preauricular, posterior auricular or occipital adenopathy.     Left side of head: No submental, submandibular, tonsillar, preauricular or posterior auricular adenopathy.     Cervical:     Right cervical: No superficial, deep or posterior cervical adenopathy.    Left cervical: No superficial, deep or posterior cervical adenopathy.     Upper Body:     Right upper body: No supraclavicular adenopathy.     Left upper body: No supraclavicular adenopathy.  Skin:    General: Skin is warm and dry.          Comments: 2-3cm focal area of erythema noted over right chest wall.  No central clearing or EM.  No purulent discharge, increase heat.   Neurological:     Mental Status: She is alert.  Psychiatric:        Speech: Speech normal.        Behavior: Behavior normal.        Thought Content: Thought content normal.

## 2023-01-28 ENCOUNTER — Encounter: Payer: Self-pay | Admitting: Family

## 2023-01-28 ENCOUNTER — Other Ambulatory Visit: Payer: Self-pay | Admitting: Family

## 2023-01-28 DIAGNOSIS — W57XXXA Bitten or stung by nonvenomous insect and other nonvenomous arthropods, initial encounter: Secondary | ICD-10-CM

## 2023-01-28 MED ORDER — DOXYCYCLINE HYCLATE 100 MG PO TABS: 100.0000 mg | ORAL_TABLET | Freq: Two times a day (BID) | ORAL | 0 refills | Status: AC

## 2023-02-19 ENCOUNTER — Other Ambulatory Visit: Payer: Self-pay | Admitting: Family

## 2023-02-19 DIAGNOSIS — F419 Anxiety disorder, unspecified: Secondary | ICD-10-CM

## 2023-03-03 ENCOUNTER — Encounter: Payer: Self-pay | Admitting: Family

## 2023-03-03 NOTE — Telephone Encounter (Signed)
Spoke to pt and scheduled appt for 4/26 24

## 2023-03-04 ENCOUNTER — Other Ambulatory Visit (HOSPITAL_COMMUNITY)
Admission: RE | Admit: 2023-03-04 | Discharge: 2023-03-04 | Disposition: A | Discharge status: Home / Self Care | Payer: BC Managed Care – PPO | Source: Ambulatory Visit | Attending: Family | Admitting: Family

## 2023-03-04 ENCOUNTER — Encounter: Payer: Self-pay | Admitting: Family

## 2023-03-04 ENCOUNTER — Other Ambulatory Visit: Payer: Self-pay | Admitting: Family

## 2023-03-04 ENCOUNTER — Ambulatory Visit
Admission: RE | Admit: 2023-03-04 | Discharge: 2023-03-04 | Disposition: A | Discharge status: Home / Self Care | Payer: No Typology Code available for payment source | Source: Ambulatory Visit | Attending: Family | Admitting: Family

## 2023-03-04 ENCOUNTER — Ambulatory Visit: Payer: No Typology Code available for payment source | Admitting: Family

## 2023-03-04 VITALS — BP 120/82 | HR 89 | Temp 98.7°F | Ht 64.0 in | Wt 193.0 lb

## 2023-03-04 DIAGNOSIS — R102 Pelvic and perineal pain: Secondary | ICD-10-CM | POA: Insufficient documentation

## 2023-03-04 DIAGNOSIS — R319 Hematuria, unspecified: Secondary | ICD-10-CM

## 2023-03-04 DIAGNOSIS — R1030 Lower abdominal pain, unspecified: Secondary | ICD-10-CM | POA: Diagnosis not present

## 2023-03-04 DIAGNOSIS — Z803 Family history of malignant neoplasm of breast: Secondary | ICD-10-CM

## 2023-03-04 LAB — COMPREHENSIVE METABOLIC PANEL
ALT: 10 U/L (ref 0–35)
AST: 10 U/L (ref 0–37)
Albumin: 3.8 g/dL (ref 3.5–5.2)
Alkaline Phosphatase: 77 U/L (ref 39–117)
BUN: 10 mg/dL (ref 6–23)
CO2: 30 mEq/L (ref 19–32)
Calcium: 9 mg/dL (ref 8.4–10.5)
Chloride: 102 mEq/L (ref 96–112)
Creatinine, Ser: 0.83 mg/dL (ref 0.40–1.20)
GFR: 93.83 mL/min (ref >60.00)
Glucose, Bld: 80 mg/dL (ref 70–99)
Potassium: 4.3 mEq/L (ref 3.5–5.1)
Sodium: 139 mEq/L (ref 135–145)
Total Bilirubin: 0.3 mg/dL (ref 0.2–1.2)
Total Protein: 6.6 g/dL (ref 6.0–8.3)

## 2023-03-04 LAB — CBC WITH DIFFERENTIAL/PLATELET
Basophils Absolute: 0 10*3/uL (ref 0.0–0.1)
Basophils Relative: 0.4 % (ref 0.0–3.0)
Eosinophils Absolute: 0.1 10*3/uL (ref 0.0–0.7)
Eosinophils Relative: 1.6 % (ref 0.0–5.0)
HCT: 41.1 % (ref 36.0–46.0)
Hemoglobin: 13.7 g/dL (ref 12.0–15.0)
Lymphocytes Relative: 21.3 % (ref 12.0–46.0)
Lymphs Abs: 1.6 10*3/uL (ref 0.7–4.0)
MCHC: 33.4 g/dL (ref 30.0–36.0)
MCV: 91.3 fl (ref 78.0–100.0)
Monocytes Absolute: 0.5 10*3/uL (ref 0.1–1.0)
Monocytes Relative: 6.5 % (ref 3.0–12.0)
Neutro Abs: 5.3 10*3/uL (ref 1.4–7.7)
Neutrophils Relative %: 70.2 % (ref 43.0–77.0)
Platelets: 267 10*3/uL (ref 150.0–400.0)
RBC: 4.5 Mil/uL (ref 3.87–5.11)
RDW: 14.2 % (ref 11.5–15.5)
WBC: 7.5 10*3/uL (ref 4.0–10.5)

## 2023-03-04 LAB — POCT URINALYSIS DIPSTICK
Bilirubin, UA: NEGATIVE
Glucose, UA: NEGATIVE
Ketones, UA: NEGATIVE
Leukocytes, UA: NEGATIVE
Nitrite, UA: NEGATIVE
Protein, UA: NEGATIVE
Spec Grav, UA: 1.015 (ref 1.010–1.025)
Urobilinogen, UA: 0.2 E.U./dL
pH, UA: 7.5 (ref 5.0–8.0)

## 2023-03-04 LAB — URINALYSIS, ROUTINE W REFLEX MICROSCOPIC
Bilirubin Urine: NEGATIVE
Hgb urine dipstick: NEGATIVE
Ketones, ur: NEGATIVE
Leukocytes,Ua: NEGATIVE
Nitrite: NEGATIVE
Specific Gravity, Urine: 1.01 (ref 1.000–1.030)
Total Protein, Urine: NEGATIVE
Urine Glucose: NEGATIVE
Urobilinogen, UA: 0.2 (ref 0.0–1.0)
pH: 7 (ref 5.0–8.0)

## 2023-03-04 LAB — POCT URINE PREGNANCY: Preg Test, Ur: NEGATIVE

## 2023-03-04 NOTE — Progress Notes (Signed)
Assessment & Plan:  Pelvic pain Assessment & Plan: Acute onset.  Differential includes ovarian cyst, dysmenorrhea.  less likely ovarian torsion as pain has improved.  Differential would still include UTI, she is renal stone bacteremia, appendicitis. Urine HCG pending.  Pending transvaginal ultrasound, rule out ovarian torsion, ovarian cyst, fibroid. Consider progesterone only contraception or copper IUD. If workup unrevealing, plan to pursue CT abdomen pelvis.   Orders: -     hCG, serum, qualitative -     CBC with Differential/Platelet -     Comprehensive metabolic panel -     Wet prep  (BD Affirm) ([place]) -     US PELVIC COMPLETE W TRANSVAGINAL AND TORSION R/O; Future -     Cervicovaginal ancillary only  Lower abdominal pain -     Urinalysis, Routine w reflex microscopic -     Urine Culture -     POCT urinalysis dipstick -     POCT urine pregnancy  Family history of breast cancer in mother -     MM 3D DIAGNOSTIC MAMMOGRAM BILATERAL BREAST; Future     Return precautions given.   Risks, benefits, and alternatives of the medications and treatment plan prescribed today were discussed, and patient expressed understanding.   Education regarding symptom management and diagnosis given to patient on AVS either electronically or printed.  No follow-ups on file.  Isabel Plowman, FNP  Subjective:    Patient ID: Isabel [name], female    DOB: 22-May-1991, 32 y.o.   MRN: [id number]  CC: Isabel [name] is a 32 y.o. female who presents today for an acute visit.    HPI: Complains of suprapubic pain, started acutely 2 days, some improvement. Pain was so severe 'like labor. Pain intensity has improved. She felt nauseated 2 nights ago.      No leg pain, constipation, leg numbness, saddle   No increased vaginal discharge, dysuria  LMP she thinks earlier this month   Tramadol without relief.  No h/o renal stone History of cholecystectomy. History of right ovarian  complex cyst.  Resolved with ultrasound 09/18/2022.   Dr Valentino Saxon 02/06/21 Pap smear negative malignancy 3//31/2022  progesterone-only methods to help manage cycles as well as PMS symptoms.   Due mammogram June 2024, breast MRI 10/2023 Nipple discharge resolved.   Allergies: Amoxicillin and Penicillins Current Outpatient Medications on File Prior to Visit  Medication Sig Dispense Refill   busPIRone (BUSPAR) 10 MG tablet TAKE 1 TABLET BY MOUTH THREE TIMES A DAY 270 tablet 3   gabapentin (NEURONTIN) 100 MG capsule Take 2 capsules (200 mg total) by mouth 2 (two) times daily. 360 capsule 3   methocarbamol (ROBAXIN) 500 MG tablet Take 1 tablet (500 mg total) by mouth 2 (two) times daily as needed for muscle spasms. 10 tablet 0   nortriptyline (PAMELOR) 10 MG capsule TAKE 1 CAPSULE BY MOUTH AT BEDTIME 90 capsule 3   rizatriptan (MAXALT) 10 MG tablet Take 1 tablet earliest onset of migraine.  May repeat in 2 hours if needed.  Maximum 2 tablets in 24 hours 10 tablet 5   traMADol (ULTRAM) 50 MG tablet Take 1-2 tablets (50-100 mg total) by mouth every 6 (six) hours as needed. 60 tablet 0   [DISCONTINUED] famotidine (PEPCID) 20 MG tablet Take 1 tablet (20 mg total) by mouth 2 (two) times daily. 60 tablet 0   No current facility-administered medications on file prior to visit.    Review of Systems  Constitutional:  Negative for chills  and fever.  Respiratory:  Negative for cough.   Cardiovascular:  Negative for chest pain and palpitations.  Gastrointestinal:  Negative for constipation, nausea (resolved) and vomiting.  Genitourinary:  Positive for pelvic pain. Negative for dyspareunia, dysuria, urgency, vaginal bleeding and vaginal discharge.  Musculoskeletal:  Negative for back pain.      Objective:    BP 120/82   Pulse 89   Temp 98.7 F (37.1 C) (Oral)   Ht 5\' 4"  (1.626 m)   Wt 193 lb (87.5 kg)   SpO2 99%   BMI 33.13 kg/m   BP Readings from Last 3 Encounters:  03/04/23 120/82   01/27/23 128/86  12/08/22 128/80   Wt Readings from Last 3 Encounters:  03/04/23 193 lb (87.5 kg)  01/27/23 196 lb 9.6 oz (89.2 kg)  12/08/22 194 lb 12.8 oz (88.4 kg)    Physical Exam Vitals reviewed.  Constitutional:      Appearance: Normal appearance. She is well-developed.  Eyes:     Conjunctiva/sclera: Conjunctivae normal.  Cardiovascular:     Rate and Rhythm: Normal rate and regular rhythm.     Pulses: Normal pulses.     Heart sounds: Normal heart sounds.  Pulmonary:     Effort: Pulmonary effort is normal.     Breath sounds: Normal breath sounds. No wheezing, rhonchi or rales.  Abdominal:     General: Bowel sounds are normal. There is no distension.     Palpations: Abdomen is soft. Abdomen is not rigid. There is no fluid wave or mass.     Tenderness: There is abdominal tenderness in the suprapubic area. There is no right CVA tenderness, left CVA tenderness, guarding or rebound.    Genitourinary:    Labia:        Right: No rash, tenderness or lesion.        Left: No rash, tenderness or lesion.      Vagina: No foreign body. No vaginal discharge, erythema, tenderness or bleeding.     Cervix: No discharge.     Adnexa:        Right: Tenderness present. No mass or fullness.         Left: Tenderness present. No mass or fullness.       Comments: No vulvovaginal erythema. No lesions. Discharge is thin and clear, not purulent.  Discomfort with bimanual exam, worse on right side. Skin:    General: Skin is warm and dry.  Neurological:     Mental Status: She is alert.  Psychiatric:        Speech: Speech normal.        Behavior: Behavior normal.        Thought Content: Thought content normal.

## 2023-03-04 NOTE — Assessment & Plan Note (Addendum)
Acute onset.  Differential includes ovarian cyst, dysmenorrhea.  less likely ovarian torsion as pain has improved.  Differential would still include UTI, she is renal stone bacteremia, appendicitis. Urine HCG pending.  Pending transvaginal ultrasound, rule out ovarian torsion, ovarian cyst, fibroid. Consider progesterone only contraception or copper IUD. If workup unrevealing, plan to pursue CT abdomen pelvis.

## 2023-03-04 NOTE — Patient Instructions (Addendum)
We will start with labs, stat transvaginal ultrasound.  Please keep your phone on you today.  Your mammogram is due in June 2024.  Breast MRI due in December 2024.  Let us know if you dont hear back within a week in regards to an appointment being scheduled.   So that you are aware, if you are [facility] MyChart user , please pay attention to your MyChart messages as you may receive a MyChart message with a phone number to call and schedule this test/appointment own your own from our referral coordinator. This is a new process so I do not want you to miss this message.  If you are not a MyChart user, you will receive a phone call.

## 2023-03-05 ENCOUNTER — Other Ambulatory Visit: Payer: Self-pay | Admitting: Family

## 2023-03-05 DIAGNOSIS — R102 Pelvic and perineal pain: Secondary | ICD-10-CM

## 2023-03-05 LAB — URINE CULTURE
MICRO NUMBER:: 14873939
Result:: NO GROWTH
SPECIMEN QUALITY:: ADEQUATE

## 2023-03-05 LAB — CERVICOVAGINAL ANCILLARY ONLY
Bacterial Vaginitis (gardnerella): POSITIVE — AB
Candida Glabrata: NEGATIVE
Candida Vaginitis: POSITIVE — AB
Comment: NEGATIVE
Comment: NEGATIVE
Comment: NEGATIVE
Comment: NEGATIVE
Trichomonas: NEGATIVE

## 2023-03-05 LAB — HCG, SERUM, QUALITATIVE: Preg, Serum: NEGATIVE

## 2023-03-05 MED ORDER — NAPROXEN SODIUM 550 MG PO TABS: ORAL_TABLET | ORAL | 1 refills | Status: DC

## 2023-03-06 ENCOUNTER — Other Ambulatory Visit: Payer: Self-pay | Admitting: Family

## 2023-03-06 DIAGNOSIS — B9689 Other specified bacterial agents as the cause of diseases classified elsewhere: Secondary | ICD-10-CM

## 2023-03-06 DIAGNOSIS — B3731 Acute candidiasis of vulva and vagina: Secondary | ICD-10-CM

## 2023-03-06 MED ORDER — METRONIDAZOLE 500 MG PO TABS: 500.0000 mg | ORAL_TABLET | Freq: Two times a day (BID) | ORAL | 0 refills | Status: DC

## 2023-03-06 MED ORDER — FLUCONAZOLE 150 MG PO TABS: 150.0000 mg | ORAL_TABLET | Freq: Once | ORAL | 0 refills | Status: AC

## 2023-03-12 ENCOUNTER — Other Ambulatory Visit: Payer: Self-pay

## 2023-03-12 DIAGNOSIS — R102 Pelvic and perineal pain: Secondary | ICD-10-CM

## 2023-03-12 NOTE — Progress Notes (Signed)
Orders only

## 2023-03-31 NOTE — Progress Notes (Unsigned)
    GYNECOLOGY PROGRESS NOTE  Subjective:    Patient ID: Isabel [name], female    DOB: 19-May-1991, 32 y.o.   MRN: [id number]  HPI  Patient is a 32 y.o. [phone] female who presents for follow up on suprapubic pain that occurred about 1 month ago. She reported during that time that pain was so severe 'like labor" she also felt nauseated for about 2 days. She had an ultrasound done on 03/04/2023 that revealed a cyst.  The following portions of the patient's history were reviewed and updated as appropriate: allergies, current medications, past family history, past medical history, past social history, past surgical history, and problem list.  Review of Systems Pertinent items noted in HPI and remainder of comprehensive ROS otherwise negative.   Objective:   There were no vitals taken for this visit. There is no height or weight on file to calculate BMI. General appearance: alert, cooperative, and no distress Abdomen: {abdominal exam:16834} Pelvic: {pelvic exam:16852::"cervix normal in appearance","external genitalia normal","no adnexal masses or tenderness","no cervical motion tenderness","rectovaginal septum normal","uterus normal size, shape, and consistency","vagina normal without discharge"} Extremities: {extremity exam:5109} Neurologic: {neuro exam:17854}   Assessment:   No diagnosis found.   Plan:   There are no diagnoses linked to this encounter.    Hildred Laser, MD [facility]

## 2023-04-01 ENCOUNTER — Encounter: Payer: Self-pay | Admitting: Obstetrics and Gynecology

## 2023-04-01 ENCOUNTER — Ambulatory Visit: Payer: No Typology Code available for payment source | Admitting: Obstetrics and Gynecology

## 2023-04-01 VITALS — BP 99/74 | HR 91 | Ht 64.0 in | Wt 196.2 lb

## 2023-04-01 DIAGNOSIS — R635 Abnormal weight gain: Secondary | ICD-10-CM

## 2023-04-01 DIAGNOSIS — N83202 Unspecified ovarian cyst, left side: Secondary | ICD-10-CM

## 2023-04-01 DIAGNOSIS — N914 Secondary oligomenorrhea: Secondary | ICD-10-CM | POA: Diagnosis not present

## 2023-04-01 NOTE — Patient Instructions (Signed)
Ovarian Cystectomy Ovarian cystectomy is a procedure that is done to remove a fluid-filled sac (cyst) on an ovary. The ovaries are small organs that produce eggs in women. Various types of cysts can form on the ovaries. Most cysts are not cancerous. This procedure may be done for cysts that are large, cause symptoms, or do not go away on their own. It may also be done for a cyst that is cancerous or might be cancerous. This surgery can be done using a laparoscopic technique or an open abdominal technique. The laparoscopic technique is minimally invasive and results in smaller incisions and a faster recovery. The technique used will depend on your age, the type of cyst that you have, and whether the cyst is cancerous. The laparoscopic technique is not used for a cancerous cyst. Tell a health care provider about: Any allergies you have. All medicines you are taking, including vitamins, herbs, eye drops, creams, and over-the-counter medicines. Any problems you or family members have had with anesthetic medicines. Any blood disorders you have. Any surgeries you have had. Any medical conditions you have. Whether you are pregnant or may be pregnant. What are the risks? Generally, this is a safe procedure. However, problems may occur, including: Excessive bleeding. Infection. Damage to nearby structures or organs. Allergic reactions to medicines. Blood clots. Inability to get pregnant (infertility). What happens before the procedure? Staying hydrated Follow instructions from your health care provider about hydration, which may include: Up to 2 hours before the procedure - you may continue to drink clear liquids, such as water, clear fruit juice, black coffee, and plain tea.  Eating and drinking restrictions Follow instructions from your health care provider about eating and drinking, which may include: 8 hours before the procedure - stop eating heavy meals or foods, such as meat, fried foods, or  fatty foods. 6 hours before the procedure - stop eating light meals or foods, such as toast or cereal. 6 hours before the procedure - stop drinking milk or drinks that contain milk. 2 hours before the procedure - stop drinking clear liquids. Medicines Ask your health care provider about: Changing or stopping your regular medicines. This is especially important if you are taking diabetes medicines or blood thinners. Taking medicines such as aspirin and ibuprofen. These medicines can thin your blood. Do not take these medicines unless your health care provider tells you to take them. Taking over-the-counter medicines, vitamins, herbs, and supplements. General instructions Do not use any products that contain nicotine or tobacco for at least 4 weeks before the procedure. These products include cigarettes, e-cigarettes, and chewing tobacco. If you need help quitting, ask your health care provider. Ask your health care provider: How your surgery site will be marked. What steps will be taken to help prevent infection. These may include: Removing hair at the surgery site. Washing skin with a germ-killing soap. Taking antibiotic medicine. You may be asked to shower with a germ-killing soap. Plan to have someone take you home from the hospital or clinic. Plan to have someone help with household activities for 1-2 weeks after the procedure. Let your health care provider know if you develop a cold or any infection before your surgery. What happens during the procedure? An IV will be inserted into one of your veins. You will be given one or more of the following: A medicine to help you relax (sedative). A medicine to make you fall asleep (general anesthetic). Small monitors will be attached to your body. They will be   used to check your heart, blood pressure, and oxygen level. A breathing tube will be placed to help you breathe during the procedure. Your surgeon will do the surgery using either the  laparoscopic technique or the open abdominal technique. Laparoscopic technique  Several small incisions will be made in your abdomen. Your abdomen will be filled with carbon dioxide gas to make it expand. This will give the surgeon more room to operate. It will also make your organs easier to see. A thin scope with a camera (laparoscope) will be put through one of the small incisions. The laparoscope will send a picture to a monitor in the operating room to help the surgeon see inside your body. Hollow tubes will be put through the other small incisions in your abdomen. The tools needed for the procedure will be put through these tubes. The ovary with the cyst will be identified, and the cyst will be removed. The tools will then be removed, and the incisions will be closed with stitches or skin glue. Bandages (dressings) may be applied to the incisions. Open abdominal technique A single, large incision will be made along your bikini line or in the middle of your lower abdomen. The ovary with the cyst will be identified, and the cyst will be removed. The incision will then be closed with stitches or staples. Bandages (dressings) may be applied to the incisions. The procedure may vary among health care providers and hospitals. What happens after the procedure? Your blood pressure, heart rate, breathing rate, and blood oxygen level will be monitored until you leave the hospital or clinic. Your IV will be removed after you are able to eat and drink well. You may be given medicine for pain or to help you sleep. You may be given an antibiotic medicine. Do not drive for 24 hours if you were given a sedative during the procedure. The cyst that was removed will be sent to the lab for testing. If the cyst has cancer cells, both ovaries may need to be removed during a different surgery. It is up to you to get the results of your procedure. Ask your health care provider, or the department that is doing the  procedure, when your results will be ready. Summary Ovarian cystectomy is a procedure that is done to remove a cyst on an ovary. This procedure may be done for cysts that are large, cause symptoms, or do not go away on their own. It may also be done for a cyst that is cancerous or might be cancerous. Follow instructions from your health care provider about eating and drinking before the procedure. After the cyst is removed, it will be sent to the lab for testing. This information is not intended to replace advice given to you by your health care provider. Make sure you discuss any questions you have with your health care provider. Document Revised: 04/04/2020 Document Reviewed: 04/04/2020 [employer] Patient Education  2023 [employer]  

## 2023-04-10 LAB — ESTROGENS, TOTAL: Estrogen: 502 pg/mL

## 2023-04-10 LAB — INSULIN, RANDOM: INSULIN: 15.1 u[IU]/mL (ref 2.6–24.9)

## 2023-04-10 LAB — PROGESTERONE: Progesterone: 0.5 ng/mL

## 2023-04-10 LAB — FOLLICLE STIMULATING HORMONE: FSH: 6.4 m[IU]/mL

## 2023-04-10 LAB — TESTOSTERONE, FREE, TOTAL, SHBG
Sex Hormone Binding: 43 nmol/L (ref 24.6–122.0)
Testosterone, Free: 1.4 pg/mL (ref 0.0–4.2)
Testosterone: 27 ng/dL (ref 8–60)

## 2023-04-10 LAB — PROLACTIN: Prolactin: 14.8 ng/mL (ref 4.8–33.4)

## 2023-04-10 LAB — 17-HYDROXYPROGESTERONE: 17-OH Progesterone LCMS: 69 ng/dL

## 2023-04-10 LAB — DHEA-SULFATE: DHEA-SO4: 128 ug/dL (ref 84.8–378.0)

## 2023-04-28 NOTE — Progress Notes (Signed)
GYNECOLOGY PREOPERATIVE HISTORY AND PHYSICAL   Subjective:  Isabel [name] is a 32 y.o. [phone] here for surgical management of left ovarian cyst and pelvic pain. Patient with a history of ovarian cysts in the past.  Also with new onset of oligomenorrhea.  Is currently taking Taytulla (combined OCP) to regulate her cycle and perhaps decrease size of the cyst.  Opted for surgical management due to the significant pain. Indications for procedure include: cyst on left ovary, secondary to oligomenorrhea . No significant preoperative concerns.  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 Negative for ayptia or malignancy   BREAST BIOPSY Left 09/10/2021   stereo bx "coil" clip-BENIGN BREAST TISSUE WITH PSEUDOANGIOMATOUS STROMAL HYPERPLASIA. - NEGATIVE FOR ATYPIA AND MALIGNANCY   CHOLECYSTECTOMY      OB History  Gravida Para Term Preterm AB Living  4 2 2   2 1   SAB IAB Ectopic Multiple Live Births  2     0 1    # Outcome Date GA Lbr Len/2nd Weight Sex Delivery Anes PTL Lv  4 Term 11/02/16 [redacted]w[redacted]d / 00:09 6 lb 8.4 oz (2.96 kg) M Vag-Spont None  LIV  3 SAB 2016 [redacted]w[redacted]d         2 SAB 2016 [redacted]w[redacted]d         1 Term 2011 [redacted]w[redacted]d  7 lb 1.8 oz (3.225 kg) F Vag-Spont EPI  LIV    Family History  Problem Relation Age of Onset   Breast  cancer Mother 27       currently 71   Hypertension Father    Arthritis Other    Diabetes Other    Cancer Other    Diabetes Maternal Grandmother    Diabetes Maternal Grandfather    Diabetes Paternal Grandmother    Diabetes Paternal Grandfather    Breast cancer Maternal Aunt 50       currently 4; PALB2 mutation   Colon cancer Neg Hx    Heart disease Neg Hx     Social History   Socioeconomic History   Marital status: Married    Spouse name: Not on file   Number of children: Not on file   Years of education: 12   Highest education level: GED or equivalent  Occupational History   Not on file  Tobacco Use   Smoking status: Every Day    Types: E-cigarettes   Smokeless tobacco: Never  Vaping Use   Vaping Use: Every day   Substances: Nicotine  Substance and Sexual Activity   Alcohol use: No   Drug use: Not Currently    Types: Marijuana   Sexual activity: Yes    Birth control/protection: None, Condom    Comment: Husband plans vasectomy   Other Topics Concern   Not on file  Social History Narrative   Right handed  Lives alone one story   Kids 56 ( boy) , 42 ( girl) , 5 ( boy)   Social Determinants of Health   Financial Resource Strain: Low Risk  (01/27/2023)   Overall Financial Resource Strain (CARDIA)    Difficulty of Paying Living Expenses: Not hard at all  Food Insecurity: No Food Insecurity (01/27/2023)   Hunger Vital Sign    Worried About Running Out of Food in the Last Year: Never true    Ran Out of Food in the Last Year: Never true  Transportation Needs: No Transportation Needs (01/27/2023)   PRAPARE - Administrator, Civil Service (Medical): No    Lack of Transportation (Non-Medical): No  Physical Activity: Insufficiently Active (01/27/2023)   Exercise Vital Sign    Days of Exercise per Week: 5 days    Minutes of Exercise per Session: 20 min  Stress: No Stress Concern Present (01/27/2023)   [employer] of Occupational Health - Occupational  Stress Questionnaire    Feeling of Stress : Not at all  Social Connections: Moderately Integrated (01/27/2023)   Social Connection and Isolation Panel [NHANES]    Frequency of Communication with Friends and Family: Three times a week    Frequency of Social Gatherings with Friends and Family: Once a week    Attends Religious Services: More than 4 times per year    Active Member of [employer] or Organizations: No    Attends Engineer, structural: Not on file    Marital Status: Married  Catering manager Violence: Not on file    Current Outpatient Medications on File Prior to Visit  Medication Sig Dispense Refill   busPIRone (BUSPAR) 10 MG tablet TAKE 1 TABLET BY MOUTH THREE TIMES A DAY 270 tablet 3   gabapentin (NEURONTIN) 100 MG capsule Take 2 capsules (200 mg total) by mouth 2 (two) times daily. 360 capsule 3   methocarbamol (ROBAXIN) 500 MG tablet Take 1 tablet (500 mg total) by mouth 2 (two) times daily as needed for muscle spasms. (Patient not taking: Reported on 04/01/2023) 10 tablet 0   metroNIDAZOLE (FLAGYL) 500 MG tablet Take 1 tablet (500 mg total) by mouth 2 (two) times daily. (Patient not taking: Reported on 04/01/2023) 14 tablet 0   naproxen sodium (ANAPROX) 550 MG tablet Take for pelvic pain. May repeat dose in 12 hours if needed. Do not exceed two tablets in 24 hours. Take with food (Patient not taking: Reported on 04/01/2023) 30 tablet 1   nortriptyline (PAMELOR) 10 MG capsule TAKE 1 CAPSULE BY MOUTH AT BEDTIME 90 capsule 3   rizatriptan (MAXALT) 10 MG tablet Take 1 tablet earliest onset of migraine.  May repeat in 2 hours if needed.  Maximum 2 tablets in 24 hours (Patient not taking: Reported on 04/01/2023) 10 tablet 5   traMADol (ULTRAM) 50 MG tablet Take 1-2 tablets (50-100 mg total) by mouth every 6 (six) hours as needed. (Patient not taking: Reported on 04/01/2023) 60 tablet 0   [DISCONTINUED] famotidine (PEPCID) 20 MG tablet Take 1 tablet (20 mg total) by mouth 2 (two) times  daily. 60 tablet 0   No current facility-administered medications on file prior to visit.   Allergies  Allergen Reactions   Amoxicillin Hives   Penicillins Hives     Review of Systems Constitutional: No recent fever/chills/sweats Respiratory: No recent cough/bronchitis Cardiovascular: No chest pain Gastrointestinal: No recent nausea/vomiting/diarrhea Genitourinary: No UTI symptoms Hematologic/lymphatic:No history of coagulopathy or recent blood thinner use    Objective:  Blood pressure 115/79, pulse 87, height 5\' 4"  (1.626 m), weight 199 lb (90.3 kg), last menstrual period 02/23/2023. Body mass index is 34.16 kg/m.  CONSTITUTIONAL: Well-developed, well-nourished female in no acute distress.  HENT:  Normocephalic, atraumatic, External right and left ear normal. Oropharynx is clear and moist EYES: Conjunctivae and EOM are normal. Pupils are equal, round, and reactive to light. No scleral icterus.  NECK: Normal range of motion, supple, no masses SKIN: Skin is warm and dry. No rash noted. Not diaphoretic. No erythema. No pallor. NEUROLOGIC: Alert and oriented to person, place, and time. Normal reflexes, muscle tone coordination. No cranial nerve deficit noted. PSYCHIATRIC: Normal mood and affect. Normal behavior. Normal judgment and thought content. CARDIOVASCULAR: Normal heart rate noted, regular rhythm RESPIRATORY: Effort and breath sounds normal, no problems with respiration noted ABDOMEN: Soft, nontender, nondistended. PELVIC: Deferred MUSCULOSKELETAL: Normal range of motion. No edema and no tenderness. 2+ distal pulses.    Labs:  Lab Results  Component Value Date   WBC 7.5 03/04/2023   HGB 13.7 03/04/2023   HCT 41.1 03/04/2023   MCV 91.3 03/04/2023   PLT 267.0 03/04/2023     Imaging Studies: US PELVIC COMPLETE W TRANSVAGINAL AND TORSION R/O CLINICAL DATA:  Suprapubic pain.  EXAM: TRANSABDOMINAL AND TRANSVAGINAL ULTRASOUND OF PELVIS  DOPPLER ULTRASOUND OF  OVARIES  TECHNIQUE: Both transabdominal and transvaginal ultrasound examinations of the pelvis were performed. Transabdominal technique was performed for global imaging of the pelvis including uterus, ovaries, adnexal regions, and pelvic cul-de-sac.  It was necessary to proceed with endovaginal exam following the transabdominal exam to visualize the adnexa. Color and duplex Doppler ultrasound was utilized to evaluate blood flow to the ovaries.  COMPARISON:  September 16, 2022  FINDINGS: Uterus  Measurements: 7.6 x 4.4 x 5.3 cm = volume: 94.2 mL. No fibroids or other mass visualized.  Endometrium  Thickness: 6.3.  No focal abnormality visualized.  Right ovary  Not Seen.  Left ovary  Measurements: 6.9 x 5.3 x 5.8 cm = volume: 110.5 mL. Solid-appearing left ovarian mass measures 4.8 x 4.6 x 4.9 cm. There is adjacent thick-walled cyst measuring 4.2 x 3.1 x 3.6 cm.  Pulsed Doppler evaluation of the left ovaries demonstrates normal low-resistance arterial and venous waveforms.  Other findings  Small amount of free fluid.  IMPRESSION: Nonvisualization of the right ovary.  Normal uterus.  Solid-appearing 4.9 cm left adnexal mass. Recommend follow-up in 8-10 weeks. Alternatively, pelvic MRI may be considered.  Electronically Signed   By: Ted Mcalpine M.D.   On: 03/04/2023 14:50    Assessment:    1. Cyst of left ovary   2. Secondary oligomenorrhea   3. Pelvic pain       Plan:    Counseling: Procedure, risks, reasons, benefits and complications (including injury to bowel, bladder, major blood vessel, ureter, bleeding, possibility of transfusion, infection, or fistula formation) reviewed in detail. Also discussed possibility of oopohorectomy based on appearance of the cyst (however low probability). Likelihood of success in alleviating the patient's condition was discussed. Routine postoperative instructions will be reviewed with the patient and her family in  detail after surgery.  The patient concurred with the proposed plan, giving informed written consent for the surgery.   Preop testing ordered. Instructions reviewed, including NPO after midnight. Continue Taytulla until after surgical intervention.    Hildred Laser, MD [facility]

## 2023-04-30 ENCOUNTER — Encounter: Payer: Self-pay | Admitting: Obstetrics and Gynecology

## 2023-04-30 ENCOUNTER — Ambulatory Visit: Payer: BC Managed Care – PPO | Admitting: Obstetrics and Gynecology

## 2023-04-30 VITALS — BP 115/79 | HR 87 | Ht 64.0 in | Wt 199.0 lb

## 2023-04-30 DIAGNOSIS — R102 Pelvic and perineal pain: Secondary | ICD-10-CM

## 2023-04-30 DIAGNOSIS — N914 Secondary oligomenorrhea: Secondary | ICD-10-CM

## 2023-04-30 DIAGNOSIS — N83202 Unspecified ovarian cyst, left side: Secondary | ICD-10-CM

## 2023-05-03 ENCOUNTER — Other Ambulatory Visit: Payer: Self-pay

## 2023-05-03 ENCOUNTER — Encounter
Admission: RE | Admit: 2023-05-03 | Discharge: 2023-05-03 | Disposition: A | Discharge status: Home / Self Care | Payer: BC Managed Care – PPO | Source: Ambulatory Visit | Attending: Obstetrics and Gynecology | Admitting: Obstetrics and Gynecology

## 2023-05-03 VITALS — Ht 64.0 in | Wt 199.0 lb

## 2023-05-03 DIAGNOSIS — Z01818 Encounter for other preprocedural examination: Secondary | ICD-10-CM

## 2023-05-03 NOTE — Patient Instructions (Addendum)
Your procedure is scheduled on: Monday 05/10/23 To find out your arrival time, please call [phone] between 1PM - 3PM on:   Friday 05/07/23 Report to the Registration Desk on the 1st floor of the [facility]. Free Valet parking is available.  If your arrival time is 6:00 am, do not arrive before that time as the [facility] entrance doors do not open until 6:00 am.  REMEMBER: Instructions that are not followed completely may result in serious medical risk, up to and including death; or upon the discretion of your surgeon and anesthesiologist your surgery may need to be rescheduled.  Do not eat food or drink any liquids after midnight the night before surgery.  No gum chewing or hard candies.  One week prior to surgery: Stop Anti-inflammatories (NSAIDS) such as Advil, Aleve, Ibuprofen, Motrin, Naproxen, Naprosyn and Aspirin based products such as Excedrin, Goody's Powder, BC Powder. You may however, continue to take Tylenol if needed for pain up until the day of surgery.  Stop ANY OVER THE COUNTER supplements until after surgery.  Continue taking all prescribed medications.  TAKE ONLY THESE MEDICATIONS THE MORNING OF SURGERY WITH A SIP OF WATER:  busPIRone (BUSPAR) 10 MG tablet  gabapentin (NEURONTIN) 100 MG capsule   No Alcohol for 24 hours before or after surgery.  No Smoking including e-cigarettes for 24 hours before surgery.  No chewable tobacco products for at least 6 hours before surgery.  No nicotine patches on the day of surgery.  Do not use any "recreational" drugs for at least a week (preferably 2 weeks) before your surgery.  Please be advised that the combination of cocaine and anesthesia may have negative outcomes, up to and including death. If you test positive for cocaine, your surgery will be cancelled.  On the morning of surgery brush your teeth with toothpaste and water, you may rinse your mouth with mouthwash if you wish. Do not swallow any toothpaste or  mouthwash.  Use CHG Soap or wipes as directed on instruction sheet.  Do not wear lotions, powders, or perfumes.   Do not shave body hair from the neck down 48 hours before surgery.  Wear comfortable clothing (specific to your surgery type) to the hospital.  Do not wear jewelry, make-up, hairpins, clips or nail polish.  Contact lenses, hearing aids and dentures may not be worn into surgery.  Do not bring valuables to the hospital. [facility] is not responsible for any missing/lost belongings or valuables.   Notify your doctor if there is any change in your medical condition (cold, fever, infection).  If you are being discharged the day of surgery, you will not be allowed to drive home. You will need a responsible individual to drive you home and stay with you for 24 hours after surgery.   If you are taking public transportation, you will need to have a responsible individual with you.  If you are being admitted to the hospital overnight, leave your suitcase in the car. After surgery it may be brought to your room.  In case of increased patient census, it may be necessary for you, the patient, to continue your postoperative care in the Same Day Surgery department.  After surgery, you can help prevent lung complications by doing breathing exercises.  Take deep breaths and cough every 1-2 hours. Your doctor may order a device called an Incentive Spirometer to help you take deep breaths. When coughing or sneezing, hold a pillow firmly against your incision with both hands.  This is called "splinting." Doing this helps protect your incision. It also decreases belly discomfort.  Surgery Visitation Policy:  Patients undergoing a surgery or procedure may have two family members or support persons with them as long as the person is not COVID-19 positive or experiencing its symptoms.   Inpatient Visitation:    Visiting hours are 7 a.m. to 8 p.m. Up to four visitors are allowed at one time  in a patient room. The visitors may rotate out with other people during the day. One designated support person (adult) may remain overnight.  Please call the Pre-admissions Testing Dept. at [phone] if you have any questions about these instructions.     Preparing for Surgery with CHLORHEXIDINE GLUCONATE (CHG) Soap  Chlorhexidine Gluconate (CHG) Soap  o An antiseptic cleaner that kills germs and bonds with the skin to continue killing germs even after washing  o Used for showering the night before surgery and morning of surgery  Before surgery, you can play an important role by reducing the number of germs on your skin.  CHG (Chlorhexidine gluconate) soap is an antiseptic cleanser which kills germs and bonds with the skin to continue killing germs even after washing.  Please do not use if you have an allergy to CHG or antibacterial soaps. If your skin becomes reddened/irritated stop using the CHG.  1. Shower the NIGHT BEFORE SURGERY and the MORNING OF SURGERY with CHG soap.  2. If you choose to wash your hair, wash your hair first as usual with your normal shampoo.  3. After shampooing, rinse your hair and body thoroughly to remove the shampoo.  4. Use CHG as you would any other liquid soap. You can apply CHG directly to the skin and wash gently with a scrungie or a clean washcloth.  5. Apply the CHG soap to your body only from the neck down. Do not use on open wounds or open sores. Avoid contact with your eyes, ears, mouth, and genitals (private parts). Wash face and genitals (private parts) with your normal soap.  6. Wash thoroughly, paying special attention to the area where your surgery will be performed.  7. Thoroughly rinse your body with warm water.  8. Do not shower/wash with your normal soap after using and rinsing off the CHG soap.  9. Pat yourself dry with a clean towel.  10. Wear clean pajamas to bed the night before surgery.  12. Place clean sheets on your  bed the night of your first shower and do not sleep with pets.  13. Shower again with the CHG soap on the day of surgery prior to arriving at the hospital.  14. Do not apply any deodorants/lotions/powders.  15. Please wear clean clothes to the hospital.

## 2023-05-04 ENCOUNTER — Encounter
Admission: RE | Admit: 2023-05-04 | Discharge: 2023-05-04 | Disposition: A | Discharge status: Home / Self Care | Payer: No Typology Code available for payment source | Source: Ambulatory Visit | Attending: Obstetrics and Gynecology | Admitting: Obstetrics and Gynecology

## 2023-05-04 ENCOUNTER — Encounter: Payer: Self-pay | Admitting: Urgent Care

## 2023-05-04 DIAGNOSIS — Z01818 Encounter for other preprocedural examination: Secondary | ICD-10-CM

## 2023-05-04 DIAGNOSIS — Z01812 Encounter for preprocedural laboratory examination: Secondary | ICD-10-CM | POA: Insufficient documentation

## 2023-05-04 DIAGNOSIS — N914 Secondary oligomenorrhea: Secondary | ICD-10-CM

## 2023-05-04 LAB — CBC
HCT: 41.2 % (ref 36.0–46.0)
Hemoglobin: 13.7 g/dL (ref 12.0–15.0)
MCH: 30.2 pg (ref 26.0–34.0)
MCHC: 33.3 g/dL (ref 30.0–36.0)
MCV: 90.9 fL (ref 80.0–100.0)
Platelets: 284 10*3/uL (ref 150–400)
RBC: 4.53 MIL/uL (ref 3.87–5.11)
RDW: 13.1 % (ref 11.5–15.5)
WBC: 10.5 10*3/uL (ref 4.0–10.5)
nRBC: 0 % (ref 0.0–0.2)

## 2023-05-04 LAB — TYPE AND SCREEN
ABO/RH(D): A POS
Antibody Screen: NEGATIVE

## 2023-05-10 ENCOUNTER — Ambulatory Visit
Admission: RE | Admit: 2023-05-10 | Discharge: 2023-05-10 | Disposition: A | Discharge status: Home / Self Care | Payer: PRIVATE HEALTH INSURANCE | Attending: Obstetrics and Gynecology | Admitting: Obstetrics and Gynecology

## 2023-05-10 ENCOUNTER — Ambulatory Visit: Payer: PRIVATE HEALTH INSURANCE | Admitting: Registered Nurse

## 2023-05-10 ENCOUNTER — Other Ambulatory Visit: Payer: Self-pay

## 2023-05-10 ENCOUNTER — Encounter
Admission: RE | Disposition: A | Discharge status: Home / Self Care | Payer: Self-pay | Source: Home / Self Care | Attending: Obstetrics and Gynecology

## 2023-05-10 DIAGNOSIS — Z87891 Personal history of nicotine dependence: Secondary | ICD-10-CM | POA: Insufficient documentation

## 2023-05-10 DIAGNOSIS — Z79899 Other long term (current) drug therapy: Secondary | ICD-10-CM | POA: Insufficient documentation

## 2023-05-10 DIAGNOSIS — N83202 Unspecified ovarian cyst, left side: Secondary | ICD-10-CM

## 2023-05-10 DIAGNOSIS — F419 Anxiety disorder, unspecified: Secondary | ICD-10-CM | POA: Insufficient documentation

## 2023-05-10 DIAGNOSIS — N8302 Follicular cyst of left ovary: Secondary | ICD-10-CM | POA: Diagnosis present

## 2023-05-10 DIAGNOSIS — R519 Headache, unspecified: Secondary | ICD-10-CM | POA: Insufficient documentation

## 2023-05-10 DIAGNOSIS — E669 Obesity, unspecified: Secondary | ICD-10-CM | POA: Insufficient documentation

## 2023-05-10 DIAGNOSIS — K219 Gastro-esophageal reflux disease without esophagitis: Secondary | ICD-10-CM | POA: Diagnosis not present

## 2023-05-10 DIAGNOSIS — R102 Pelvic and perineal pain: Secondary | ICD-10-CM | POA: Insufficient documentation

## 2023-05-10 DIAGNOSIS — N914 Secondary oligomenorrhea: Secondary | ICD-10-CM | POA: Diagnosis not present

## 2023-05-10 DIAGNOSIS — Z01818 Encounter for other preprocedural examination: Secondary | ICD-10-CM

## 2023-05-10 DIAGNOSIS — F32A Depression, unspecified: Secondary | ICD-10-CM | POA: Insufficient documentation

## 2023-05-10 DIAGNOSIS — Z6834 Body mass index (BMI) 34.0-34.9, adult: Secondary | ICD-10-CM | POA: Diagnosis not present

## 2023-05-10 HISTORY — PX: LAPAROSCOPIC OVARIAN CYSTECTOMY: SHX6248

## 2023-05-10 LAB — POCT PREGNANCY, URINE: Preg Test, Ur: NEGATIVE

## 2023-05-10 SURGERY — EXCISION, CYST, OVARY, LAPAROSCOPIC
Anesthesia: General | Site: Abdomen | Laterality: Left

## 2023-05-10 MED ORDER — OXYCODONE-ACETAMINOPHEN 5-325 MG PO TABS: 1.0000 | ORAL_TABLET | Freq: Four times a day (QID) | ORAL | 0 refills | Status: DC | PRN

## 2023-05-10 MED ORDER — ACETAMINOPHEN 500 MG PO TABS
ORAL_TABLET | ORAL | Status: AC
  Filled 2023-05-10: qty 2

## 2023-05-10 MED ORDER — CHLORHEXIDINE GLUCONATE 0.12 % MT SOLN
OROMUCOSAL | Status: AC
  Filled 2023-05-10: qty 15

## 2023-05-10 MED ORDER — ROCURONIUM BROMIDE 100 MG/10ML IV SOLN
INTRAVENOUS | Status: DC | PRN
  Administered 2023-05-10: 40 mg via INTRAVENOUS

## 2023-05-10 MED ORDER — CELECOXIB 200 MG PO CAPS
ORAL_CAPSULE | ORAL | Status: AC
  Filled 2023-05-10: qty 1

## 2023-05-10 MED ORDER — LIDOCAINE HCL (PF) 2 % IJ SOLN
INTRAMUSCULAR | Status: AC
  Filled 2023-05-10: qty 5

## 2023-05-10 MED ORDER — GABAPENTIN 300 MG PO CAPS
ORAL_CAPSULE | ORAL | Status: AC
  Filled 2023-05-10: qty 1

## 2023-05-10 MED ORDER — LIDOCAINE HCL (CARDIAC) PF 100 MG/5ML IV SOSY
PREFILLED_SYRINGE | INTRAVENOUS | Status: DC | PRN
  Administered 2023-05-10: 80 mg via INTRAVENOUS

## 2023-05-10 MED ORDER — HEMOSTATIC AGENTS (NO CHARGE) OPTIME
TOPICAL | Status: DC | PRN
  Administered 2023-05-10: 1 via TOPICAL

## 2023-05-10 MED ORDER — FENTANYL CITRATE (PF) 100 MCG/2ML IJ SOLN
INTRAMUSCULAR | Status: AC
  Filled 2023-05-10: qty 2

## 2023-05-10 MED ORDER — DEXAMETHASONE SODIUM PHOSPHATE 10 MG/ML IJ SOLN
INTRAMUSCULAR | Status: DC | PRN
  Administered 2023-05-10: 10 mg via INTRAVENOUS

## 2023-05-10 MED ORDER — LACTATED RINGERS IV SOLN: INTRAVENOUS | Status: DC

## 2023-05-10 MED ORDER — ACETAMINOPHEN 10 MG/ML IV SOLN: 1000.0000 mg | Freq: Once | INTRAVENOUS | Status: DC | PRN

## 2023-05-10 MED ORDER — KETOROLAC TROMETHAMINE 30 MG/ML IJ SOLN
INTRAMUSCULAR | Status: DC | PRN
  Administered 2023-05-10: 30 mg via INTRAVENOUS

## 2023-05-10 MED ORDER — ACETAMINOPHEN 500 MG PO TABS
1000.0000 mg | ORAL_TABLET | ORAL | Status: AC
  Administered 2023-05-10: 1000 mg via ORAL

## 2023-05-10 MED ORDER — OXYCODONE HCL 5 MG PO TABS
ORAL_TABLET | ORAL | Status: AC
  Filled 2023-05-10: qty 1

## 2023-05-10 MED ORDER — SUGAMMADEX SODIUM 200 MG/2ML IV SOLN
INTRAVENOUS | Status: DC | PRN
  Administered 2023-05-10: 180.6 mg via INTRAVENOUS

## 2023-05-10 MED ORDER — GABAPENTIN 300 MG PO CAPS
300.0000 mg | ORAL_CAPSULE | ORAL | Status: AC
  Administered 2023-05-10: 300 mg via ORAL

## 2023-05-10 MED ORDER — PROPOFOL 10 MG/ML IV BOLUS
INTRAVENOUS | Status: AC
  Filled 2023-05-10: qty 20

## 2023-05-10 MED ORDER — BUPIVACAINE HCL (PF) 0.5 % IJ SOLN
INTRAMUSCULAR | Status: AC
  Filled 2023-05-10: qty 30

## 2023-05-10 MED ORDER — OXYCODONE HCL 5 MG PO TABS
5.0000 mg | ORAL_TABLET | Freq: Once | ORAL | Status: AC | PRN
  Administered 2023-05-10: 5 mg via ORAL

## 2023-05-10 MED ORDER — ONDANSETRON HCL 4 MG/2ML IJ SOLN: 4.0000 mg | Freq: Once | INTRAMUSCULAR | Status: DC | PRN

## 2023-05-10 MED ORDER — ROCURONIUM BROMIDE 10 MG/ML (PF) SYRINGE
PREFILLED_SYRINGE | INTRAVENOUS | Status: AC
  Filled 2023-05-10: qty 10

## 2023-05-10 MED ORDER — ONDANSETRON HCL 4 MG/2ML IJ SOLN
INTRAMUSCULAR | Status: DC | PRN
  Administered 2023-05-10: 4 mg via INTRAVENOUS

## 2023-05-10 MED ORDER — CHLORHEXIDINE GLUCONATE 0.12 % MT SOLN
15.0000 mL | Freq: Once | OROMUCOSAL | Status: AC
  Administered 2023-05-10: 15 mL via OROMUCOSAL

## 2023-05-10 MED ORDER — MIDAZOLAM HCL 2 MG/2ML IJ SOLN
INTRAMUSCULAR | Status: AC
  Filled 2023-05-10: qty 2

## 2023-05-10 MED ORDER — ONDANSETRON HCL 4 MG/2ML IJ SOLN
INTRAMUSCULAR | Status: AC
  Filled 2023-05-10: qty 2

## 2023-05-10 MED ORDER — PHENYLEPHRINE 80 MCG/ML (10ML) SYRINGE FOR IV PUSH (FOR BLOOD PRESSURE SUPPORT)
PREFILLED_SYRINGE | INTRAVENOUS | Status: AC
  Filled 2023-05-10: qty 10

## 2023-05-10 MED ORDER — MIDAZOLAM HCL 2 MG/2ML IJ SOLN
INTRAMUSCULAR | Status: DC | PRN
  Administered 2023-05-10: 2 mg via INTRAVENOUS

## 2023-05-10 MED ORDER — FAMOTIDINE 20 MG PO TABS
ORAL_TABLET | ORAL | Status: AC
  Filled 2023-05-10: qty 1

## 2023-05-10 MED ORDER — DEXAMETHASONE SODIUM PHOSPHATE 10 MG/ML IJ SOLN
INTRAMUSCULAR | Status: AC
  Filled 2023-05-10: qty 1

## 2023-05-10 MED ORDER — CELECOXIB 200 MG PO CAPS
400.0000 mg | ORAL_CAPSULE | ORAL | Status: AC
  Administered 2023-05-10: 400 mg via ORAL

## 2023-05-10 MED ORDER — OXYCODONE HCL 5 MG/5ML PO SOLN: 5.0000 mg | Freq: Once | ORAL | Status: AC | PRN

## 2023-05-10 MED ORDER — FAMOTIDINE 20 MG PO TABS
20.0000 mg | ORAL_TABLET | Freq: Once | ORAL | Status: AC
  Administered 2023-05-10: 20 mg via ORAL

## 2023-05-10 MED ORDER — FENTANYL CITRATE (PF) 100 MCG/2ML IJ SOLN
25.0000 ug | INTRAMUSCULAR | Status: DC | PRN
  Administered 2023-05-10 (×2): 25 ug via INTRAVENOUS

## 2023-05-10 MED ORDER — FENTANYL CITRATE (PF) 100 MCG/2ML IJ SOLN
INTRAMUSCULAR | Status: DC | PRN
  Administered 2023-05-10: 100 ug via INTRAVENOUS

## 2023-05-10 MED ORDER — PROPOFOL 10 MG/ML IV BOLUS
INTRAVENOUS | Status: DC | PRN
  Administered 2023-05-10: 150 mg via INTRAVENOUS

## 2023-05-10 MED ORDER — ORAL CARE MOUTH RINSE: 15.0000 mL | Freq: Once | OROMUCOSAL | Status: AC

## 2023-05-10 MED ORDER — EPHEDRINE 5 MG/ML INJ
INTRAVENOUS | Status: AC
  Filled 2023-05-10: qty 5

## 2023-05-10 MED ORDER — 0.9 % SODIUM CHLORIDE (POUR BTL) OPTIME
TOPICAL | Status: DC | PRN
  Administered 2023-05-10: 500 mL

## 2023-05-10 MED ORDER — IBUPROFEN 600 MG PO TABS: 600.0000 mg | ORAL_TABLET | Freq: Four times a day (QID) | ORAL | 0 refills | Status: DC | PRN

## 2023-05-10 MED ORDER — BUPIVACAINE HCL 0.5 % IJ SOLN
INTRAMUSCULAR | Status: DC | PRN
  Administered 2023-05-10: 13 mL

## 2023-05-10 SURGICAL SUPPLY — 44 items
ADH SKN CLS APL DERMABOND .7 (GAUZE/BANDAGES/DRESSINGS) ×1
APL PRP STRL LF DISP 70% ISPRP (MISCELLANEOUS) ×1
BLADE SURG SZ11 CARB STEEL (BLADE) ×2 IMPLANT
CATH ROBINSON RED A/P 16FR (CATHETERS) ×2 IMPLANT
CHLORAPREP W/TINT 26 (MISCELLANEOUS) ×2 IMPLANT
CORD MONOPOLAR M/FML 12FT (MISCELLANEOUS) IMPLANT
DERMABOND ADVANCED .7 DNX12 (GAUZE/BANDAGES/DRESSINGS) ×2 IMPLANT
GAUZE 4X4 16PLY ~~LOC~~+RFID DBL (SPONGE) ×4 IMPLANT
GLOVE BIO SURGEON STRL SZ 6.5 (GLOVE) ×4 IMPLANT
GLOVE INDICATOR 7.0 STRL GRN (GLOVE) ×2 IMPLANT
GLOVE PI ORTHO PRO STRL 7.5 (GLOVE) ×2 IMPLANT
GOWN STRL REUS W/ TWL LRG LVL3 (GOWN DISPOSABLE) ×6 IMPLANT
GOWN STRL REUS W/TWL LRG LVL3 (GOWN DISPOSABLE) ×3
GOWN STRL REUS W/TWL XL LVL4 (GOWN DISPOSABLE) ×2 IMPLANT
GRASPER SUT TROCAR 14GX15 (MISCELLANEOUS) IMPLANT
HEMOSTAT SURGICEL 2X3 (HEMOSTASIS) IMPLANT
IRRIGATION STRYKERFLOW (MISCELLANEOUS) ×2 IMPLANT
IRRIGATOR STRYKERFLOW (MISCELLANEOUS)
IV LACTATED RINGERS 1000ML (IV SOLUTION) ×2 IMPLANT
KIT PINK PAD W/HEAD ARE REST (MISCELLANEOUS) ×1
KIT PINK PAD W/HEAD ARM REST (MISCELLANEOUS) ×2 IMPLANT
KIT TURNOVER CYSTO (KITS) ×2 IMPLANT
MANIFOLD NEPTUNE II (INSTRUMENTS) ×2 IMPLANT
NS IRRIG 500ML POUR BTL (IV SOLUTION) ×2 IMPLANT
PACK GYN LAPAROSCOPIC (MISCELLANEOUS) ×2 IMPLANT
PAD OB MATERNITY 4.3X12.25 (PERSONAL CARE ITEMS) ×2 IMPLANT
PAD PREP OB/GYN DISP 24X41 (PERSONAL CARE ITEMS) ×2 IMPLANT
SCISSORS METZENBAUM CVD 33 (INSTRUMENTS) IMPLANT
SCRUB CHG 4% DYNA-HEX 4OZ (MISCELLANEOUS) ×2 IMPLANT
SET TUBE SMOKE EVAC HIGH FLOW (TUBING) ×2 IMPLANT
SHEARS HARMONIC ACE PLUS 36CM (ENDOMECHANICALS) IMPLANT
SHEARS HARMONIC STRL 23CM (MISCELLANEOUS) IMPLANT
SLEEVE Z-THREAD 5X100MM (TROCAR) ×2 IMPLANT
SUT VIC AB 3-0 SH 27 (SUTURE)
SUT VIC AB 3-0 SH 27X BRD (SUTURE) IMPLANT
SUT VIC AB 4-0 FS2 27 (SUTURE) ×2 IMPLANT
SUT VICRYL 0 UR6 27IN ABS (SUTURE) ×2 IMPLANT
SYS BAG RETRIEVAL 10MM (BASKET)
SYSTEM BAG RETRIEVAL 10MM (BASKET) IMPLANT
TRAP FLUID SMOKE EVACUATOR (MISCELLANEOUS) ×2 IMPLANT
TROCAR XCEL UNIV SLVE 11M 100M (ENDOMECHANICALS) IMPLANT
TROCAR Z-THRD FIOS HNDL 11X100 (TROCAR) ×2 IMPLANT
TROCAR Z-THREAD FIOS 5X100MM (TROCAR) ×2 IMPLANT
WATER STERILE IRR 500ML POUR (IV SOLUTION) ×2 IMPLANT

## 2023-05-10 NOTE — Op Note (Signed)
Procedure(s): LAPAROSCOPIC OVARIAN CYSTECTOMY Procedure Note  Isabel [name] female 32 y.o. 05/10/2023  Indications: The patient is a 32 y.o. [phone] female with pelvic pain, left ovarian cyst, history of recurring ovarian cysts  Pre-operative Diagnosis: Pelvic pain, left ovarian cyst, history of recurrent ovarian cysts  Post-operative Diagnosis: Same  Surgeon: Hildred Laser, MD  Assistants:  None.   Anesthesia: General endotracheal anesthesia  Findings: The uterus was sounded to 7 cm Fallopian tubes and right ovary appeared normal. Left ovary with 2 simple cysts.   Procedure Details: The patient was seen in the Holding Room. The risks, benefits, complications, treatment options, and expected outcomes were discussed with the patient.  The patient concurred with the proposed plan, giving informed consent.  The site of surgery properly noted/marked. The patient was taken to the Operating Room, identified as Isabel [name] and the procedure verified as Procedure(s) (LRB): LAPAROSCOPIC OVARIAN CYSTECTOMY (Left).   She was placed under general anesthesia without difficulty. She was then placed in the dorsal lithotomy position, and was prepped and draped in a sterile manner.  A Time Out was held and the above information confirmed. A straight catheterization was performed. A sterile speculum was inserted into the vagina and the cervix was grasped at the anterior lip using a single-toothed tenaculum.  The uterus was sounded to 7 cm, and a Hulka clamp was placed for uterine manipulation.  The speculum and tenaculum were then removed. After an adequate timeout was performed, attention was turned to the abdomen where an umbilical incision was made with the scalpel.  The Optiview 5-mm trocar and sleeve were then advanced without difficulty with the laparoscope under direct visualization into the abdomen.  The abdomen was then insufflated with carbon dioxide gas and adequate pneumoperitoneum was  obtained. Two 5-mm lower quadrant ports were placed bilaterally under direct visualization.  A survey of the patient's pelvis and abdomen revealed the findings as above.  On the left  side, the two simple cysts were noted on the ovary.  These were drained using a needle aspirator with clear fluid noted.  The drainage site was then extended open, and the cyst wall was grasped using two graspers, teased and removed from the cyst.  The edges of the capsule were then removed from the remaining ovary using the Harmonic scalpel.  Hemostasis was achieved.  Surgicel was placed within the ovarian opening for added hemostasis.  All trocars were removed under direct visualization, and the abdomen which was desufflated.    All skin incisions were closed with 4-0 Monocryl subcuticular stitches. The incisions were injected with a total of 13 ml of 0.5% Sensorcaine. Dermabond was placed over the incisions. The patient tolerated the procedures well.  All instruments, needles, and sponge counts were correct x 2. The patient was taken to the recovery room awake, extubated and in stable condition.    Estimated Blood Loss:  minimal      Drains: straight catheterization prior to procedure with 500 ml of clear urine         Total IV Fluids:  600 ml  Specimens: Left ovarian cyst capsule and wall         Implants: None         Complications:  None; patient tolerated the procedure well.         Disposition: PACU - hemodynamically stable.         Condition: stable   Hildred Laser, MD [facility]

## 2023-05-10 NOTE — Discharge Instructions (Signed)

## 2023-05-10 NOTE — H&P (Signed)
GYNECOLOGY PREOPERATIVE HISTORY AND PHYSICAL   Subjective:  Isabel [name] is a 32 y.o. [phone] here for surgical management of left ovarian cyst and pelvic pain. Patient with a history of ovarian cysts in the past.  Also with new onset of oligomenorrhea.  Is currently taking Taytulla (combined OCP) to regulate her cycle and perhaps decrease size of the cyst.  Opted for surgical management due to the significant pain. Indications for procedure include: cyst on left ovary, secondary to oligomenorrhea . No significant preoperative concerns.  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 Negative for ayptia or malignancy   BREAST BIOPSY Left 09/10/2021   stereo bx "coil" clip-BENIGN BREAST TISSUE WITH PSEUDOANGIOMATOUS STROMAL HYPERPLASIA. - NEGATIVE FOR ATYPIA AND MALIGNANCY   CHOLECYSTECTOMY      OB History  Gravida Para Term Preterm AB Living  4 2 2   2 1   SAB IAB Ectopic Multiple Live Births  2     0 1    # Outcome Date GA Lbr Len/2nd Weight Sex Delivery Anes PTL Lv  4 Term 11/02/16 [redacted]w[redacted]d / 00:09 2960 g M Vag-Spont None  LIV  3 SAB 2016 [redacted]w[redacted]d         2 SAB 2016 [redacted]w[redacted]d         1 Term 2011 [redacted]w[redacted]d  3225 g F Vag-Spont EPI  LIV    Family History  Problem Relation Age of Onset   Breast cancer Mother 24        currently 52   Hypertension Father    Arthritis Other    Diabetes Other    Cancer Other    Diabetes Maternal Grandmother    Diabetes Maternal Grandfather    Diabetes Paternal Grandmother    Diabetes Paternal Grandfather    Breast cancer Maternal Aunt 50       currently 46; PALB2 mutation   Colon cancer Neg Hx    Heart disease Neg Hx     Social History   Socioeconomic History   Marital status: Married    Spouse name: Not on file   Number of children: Not on file   Years of education: 12   Highest education level: GED or equivalent  Occupational History   Not on file  Tobacco Use   Smoking status: Every Day    Types: E-cigarettes   Smokeless tobacco: Never  Vaping Use   Vaping Use: Every day   Substances: Nicotine  Substance and Sexual Activity   Alcohol use: No   Drug use: Not Currently    Types: Marijuana   Sexual activity: Yes    Birth control/protection: None, Condom    Comment: Husband plans vasectomy   Other Topics Concern   Not on file  Social History Narrative   Right handed   Lives alone one story  Kids 82 ( boy) , 20 ( girl) , 43 ( boy)   Social Determinants of Corporate investment banker Strain: Low Risk  (01/27/2023)   Overall Financial Resource Strain (CARDIA)    Difficulty of Paying Living Expenses: Not hard at all  Food Insecurity: No Food Insecurity (01/27/2023)   Hunger Vital Sign    Worried About Running Out of Food in the Last Year: Never true    Ran Out of Food in the Last Year: Never true  Transportation Needs: No Transportation Needs (01/27/2023)   PRAPARE - Administrator, Civil Service (Medical): No    Lack of Transportation (Non-Medical): No  Physical Activity: Insufficiently Active (01/27/2023)   Exercise Vital Sign    Days of Exercise per Week: 5 days    Minutes of Exercise per Session: 20 min  Stress: No Stress Concern Present (01/27/2023)   [employer] of Occupational Health - Occupational Stress Questionnaire     Feeling of Stress : Not at all  Social Connections: Moderately Integrated (01/27/2023)   Social Connection and Isolation Panel [NHANES]    Frequency of Communication with Friends and Family: Three times a week    Frequency of Social Gatherings with Friends and Family: Once a week    Attends Religious Services: More than 4 times per year    Active Member of [employer] or Organizations: No    Attends Engineer, structural: Not on file    Marital Status: Married  Catering manager Violence: Not on file    No current facility-administered medications on file prior to encounter.   Current Outpatient Medications on File Prior to Encounter  Medication Sig Dispense Refill   busPIRone (BUSPAR) 10 MG tablet TAKE 1 TABLET BY MOUTH THREE TIMES A DAY 270 tablet 3   gabapentin (NEURONTIN) 100 MG capsule Take 2 capsules (200 mg total) by mouth 2 (two) times daily. 360 capsule 3   nortriptyline (PAMELOR) 10 MG capsule TAKE 1 CAPSULE BY MOUTH AT BEDTIME 90 capsule 3   [DISCONTINUED] famotidine (PEPCID) 20 MG tablet Take 1 tablet (20 mg total) by mouth 2 (two) times daily. 60 tablet 0   Allergies  Allergen Reactions   Amoxicillin Hives   Penicillins Hives     Review of Systems Constitutional: No recent fever/chills/sweats Respiratory: No recent cough/bronchitis Cardiovascular: No chest pain Gastrointestinal: No recent nausea/vomiting/diarrhea Genitourinary: No UTI symptoms Hematologic/lymphatic:No history of coagulopathy or recent blood thinner use    Objective:   Blood pressure 120/87, pulse (!) 20, temperature (!) 97.4 F (36.3 C), temperature source Oral, height 5\' 4"  (1.626 m), weight 90.3 kg. Body mass index is 34.16 kg/m.  CONSTITUTIONAL: Well-developed, well-nourished female in no acute distress.  HENT:  Normocephalic, atraumatic, External right and left ear normal. Oropharynx is clear and moist EYES: Conjunctivae and EOM are normal. Pupils are equal, round, and reactive to light.  No scleral icterus.  NECK: Normal range of motion, supple, no masses SKIN: Skin is warm and dry. No rash noted. Not diaphoretic. No erythema. No pallor. NEUROLOGIC: Alert and oriented to person, place, and time. Normal reflexes, muscle tone coordination. No cranial nerve deficit noted. PSYCHIATRIC: Normal mood and affect. Normal behavior. Normal judgment and thought content. CARDIOVASCULAR: Normal heart rate noted, regular rhythm RESPIRATORY: Effort and breath sounds normal, no problems with respiration noted ABDOMEN: Soft, nontender, nondistended. PELVIC: Deferred MUSCULOSKELETAL: Normal range of motion. No edema and no tenderness. 2+ distal pulses.    Labs:  Lab Results  Component Value  Date   WBC 10.5 05/04/2023   HGB 13.7 05/04/2023   HCT 41.2 05/04/2023   MCV 90.9 05/04/2023   PLT 284 05/04/2023     Imaging Studies: US PELVIC COMPLETE W TRANSVAGINAL AND TORSION R/O CLINICAL DATA:  Suprapubic pain.  EXAM: TRANSABDOMINAL AND TRANSVAGINAL ULTRASOUND OF PELVIS  DOPPLER ULTRASOUND OF OVARIES  TECHNIQUE: Both transabdominal and transvaginal ultrasound examinations of the pelvis were performed. Transabdominal technique was performed for global imaging of the pelvis including uterus, ovaries, adnexal regions, and pelvic cul-de-sac.  It was necessary to proceed with endovaginal exam following the transabdominal exam to visualize the adnexa. Color and duplex Doppler ultrasound was utilized to evaluate blood flow to the ovaries.  COMPARISON:  September 16, 2022  FINDINGS: Uterus  Measurements: 7.6 x 4.4 x 5.3 cm = volume: 94.2 mL. No fibroids or other mass visualized.  Endometrium  Thickness: 6.3.  No focal abnormality visualized.  Right ovary  Not Seen.  Left ovary  Measurements: 6.9 x 5.3 x 5.8 cm = volume: 110.5 mL. Solid-appearing left ovarian mass measures 4.8 x 4.6 x 4.9 cm. There is adjacent thick-walled cyst measuring 4.2 x 3.1 x 3.6 cm.  Pulsed  Doppler evaluation of the left ovaries demonstrates normal low-resistance arterial and venous waveforms.  Other findings  Small amount of free fluid.  IMPRESSION: Nonvisualization of the right ovary.  Normal uterus.  Solid-appearing 4.9 cm left adnexal mass. Recommend follow-up in 8-10 weeks. Alternatively, pelvic MRI may be considered.  Electronically Signed   By: Ted Mcalpine M.D.   On: 03/04/2023 14:50    Assessment:    1. Ovarian cyst (left)  2. Secondary oligomenorrhea   3.  Pelvic pain      Plan:    Counseling: Procedure, risks, reasons, benefits and complications (including injury to bowel, bladder, major blood vessel, ureter, bleeding, possibility of transfusion, infection, or fistula formation) reviewed in detail. Also discussed possibility of oopohorectomy based on appearance of the cyst (however low probability). Likelihood of success in alleviating the patient's condition was discussed. Routine postoperative instructions will be reviewed with the patient and her family in detail after surgery.  The patient concurred with the proposed plan, giving informed written consent for the surgery.   Preop testing reviewed.  NPO since midnight.  Continue Taytulla until after surgical intervention.    Hildred Laser, MD [place]

## 2023-05-10 NOTE — Anesthesia Procedure Notes (Signed)
Procedure Name: Intubation Date/Time: 05/10/2023 12:47 PM  Performed by: Emeterio Reeve, CRNAPre-anesthesia Checklist: Patient identified, Emergency Drugs available, Suction available and Patient being monitored Patient Re-evaluated:Patient Re-evaluated prior to induction Oxygen Delivery Method: Circle system utilized Preoxygenation: Pre-oxygenation with 100% oxygen Induction Type: IV induction Ventilation: Mask ventilation without difficulty Laryngoscope Size: 4 and Mac Tube type: Oral Tube size: 7.0 mm Number of attempts: 1 Airway Equipment and Method: Stylet and Oral airway Placement Confirmation: ETT inserted through vocal cords under direct vision, positive ETCO2 and breath sounds checked- equal and bilateral Secured at: 22 cm Tube secured with: Tape Dental Injury: Teeth and Oropharynx as per pre-operative assessment  Comments: Cords clear; easy mask with OPA. CA

## 2023-05-10 NOTE — Anesthesia Preprocedure Evaluation (Addendum)
Anesthesia Evaluation  Patient identified by MRN, date of birth, ID band Patient awake    Reviewed: Allergy & Precautions, NPO status , Patient's Chart, lab work & pertinent test results  History of Anesthesia Complications Negative for: history of anesthetic complications  Airway Mallampati: III   Neck ROM: Full    Dental  (+) Upper Dentures, Lower Dentures   Pulmonary former smoker (current vaping)   Pulmonary exam normal breath sounds clear to auscultation       Cardiovascular Exercise Tolerance: Good negative cardio ROS Normal cardiovascular exam Rhythm:Regular Rate:Normal     Neuro/Psych  Headaches PSYCHIATRIC DISORDERS Anxiety Depression       GI/Hepatic ,GERD  ,,  Endo/Other  Obesity   Renal/GU negative Renal ROS     Musculoskeletal   Abdominal   Peds  Hematology negative hematology ROS (+)   Anesthesia Other Findings   Reproductive/Obstetrics                             Anesthesia Physical Anesthesia Plan  ASA: 2  Anesthesia Plan: General   Post-op Pain Management:    Induction: Intravenous  PONV Risk Score and Plan: 2 and Ondansetron, Dexamethasone and Treatment may vary due to age or medical condition  Airway Management Planned: Oral ETT  Additional Equipment:   Intra-op Plan:   Post-operative Plan: Extubation in OR  Informed Consent: I have reviewed the patients History and Physical, chart, labs and discussed the procedure including the risks, benefits and alternatives for the proposed anesthesia with the patient or authorized representative who has indicated his/her understanding and acceptance.     Dental advisory given  Plan Discussed with: CRNA  Anesthesia Plan Comments: (Patient consented for risks of anesthesia including but not limited to:  - adverse reactions to medications - damage to eyes, teeth, lips or other oral mucosa - nerve damage due to  positioning  - sore throat or hoarseness - damage to heart, brain, nerves, lungs, other parts of body or loss of life  Informed patient about role of CRNA in peri- and intra-operative care.  Patient voiced understanding.)       Anesthesia Quick Evaluation

## 2023-05-10 NOTE — Transfer of Care (Signed)
Immediate Anesthesia Transfer of Care Note  Patient: Isabel [name]  Procedure(s) Performed: LAPAROSCOPIC OVARIAN CYSTECTOMY (Left: Abdomen)  Patient Location: PACU  Anesthesia Type:General  Level of Consciousness: awake, alert , and oriented  Airway & Oxygen Therapy: Patient Spontanous Breathing and Patient connected to face mask oxygen  Post-op Assessment: Report given to RN and Post -op Vital signs reviewed and stable  Post vital signs: stable  Last Vitals:  Vitals Value Taken Time  BP 126/79 05/10/23 1424  Temp    Pulse 106 05/10/23 1428  Resp 20 05/10/23 1428  SpO2 94 % 05/10/23 1428  Vitals shown include unvalidated device data.  Last Pain:  Vitals:   05/10/23 0957  TempSrc: Oral         Complications: No notable events documented.

## 2023-05-11 ENCOUNTER — Encounter: Payer: Self-pay | Admitting: Obstetrics and Gynecology

## 2023-05-12 ENCOUNTER — Encounter: Payer: Self-pay | Admitting: Obstetrics and Gynecology

## 2023-05-12 NOTE — Telephone Encounter (Signed)
No, she is fine to go back to work Monday. Can write work notice.

## 2023-05-12 NOTE — Anesthesia Postprocedure Evaluation (Signed)
Anesthesia Post Note  Patient: Isabel [name]  Procedure(s) Performed: LAPAROSCOPIC OVARIAN CYSTECTOMY (Left: Abdomen)  Patient location during evaluation: PACU Anesthesia Type: General Level of consciousness: awake and alert, oriented and patient cooperative Pain management: pain level controlled Vital Signs Assessment: post-procedure vital signs reviewed and stable Respiratory status: spontaneous breathing, nonlabored ventilation and respiratory function stable Cardiovascular status: blood pressure returned to baseline and stable Postop Assessment: adequate PO intake Anesthetic complications: no   No notable events documented.   Last Vitals:  Vitals:   05/10/23 1450 05/10/23 1504  BP: 116/82 120/82  Pulse: 89 88  Resp: 16 16  Temp: 36.5 C   SpO2: 96% 95%    Last Pain:  Vitals:   05/10/23 1504  TempSrc:   PainSc: 3                  Reed Breech

## 2023-05-18 ENCOUNTER — Encounter: Payer: Self-pay | Admitting: Obstetrics and Gynecology

## 2023-05-18 ENCOUNTER — Ambulatory Visit: Payer: BC Managed Care – PPO | Admitting: Obstetrics and Gynecology

## 2023-05-18 VITALS — BP 119/75 | HR 99 | Resp 16 | Ht 64.0 in | Wt 198.0 lb

## 2023-05-18 DIAGNOSIS — Z4889 Encounter for other specified surgical aftercare: Secondary | ICD-10-CM

## 2023-05-18 DIAGNOSIS — Z1501 Genetic susceptibility to malignant neoplasm of breast: Secondary | ICD-10-CM

## 2023-05-18 DIAGNOSIS — Z803 Family history of malignant neoplasm of breast: Secondary | ICD-10-CM

## 2023-05-18 DIAGNOSIS — Z8742 Personal history of other diseases of the female genital tract: Secondary | ICD-10-CM

## 2023-05-18 MED ORDER — NORETHINDRONE ACETATE 5 MG PO TABS: 5.0000 mg | ORAL_TABLET | Freq: Every day | ORAL | 3 refills | Status: DC

## 2023-05-18 NOTE — Progress Notes (Signed)
    OBSTETRICS/GYNECOLOGY POST-OPERATIVE CLINIC VISIT  Subjective:     Isabel [name] is a 32 y.o. female who presents to the clinic 1 weeks status post LAPAROSCOPIC OVARIAN CYSTECTOMY  for  Left Ovarian Cyst .  Patient with history of ovarian cysts and irregular menses.  Eating a regular diet without difficulty. Bowel movements are normal. The patient is not having any pain.  The following portions of the patient's history were reviewed and updated as appropriate: allergies, current medications, past family history, past medical history, past social history, past surgical history, and problem list.  Review of Systems Pertinent items are noted in HPI.   Objective:   BP 119/75   Pulse 99   Resp 16   Ht 5\' 4"  (1.626 m)   Wt 198 lb (89.8 kg)   BMI 33.99 kg/m  Body mass index is 33.99 kg/m.  General:  alert and no distress  Abdomen: soft, bowel sounds active, non-tender  Incision:   healing well, no drainage, no erythema, no hernia, no seroma, no swelling, no dehiscence, incision well approximated    Pathology:  Benign cystadenoma  Assessment:   Patient s/p LAPAROSCOPIC OVARIAN CYSTECTOMY (surgery)  Doing well postoperatively.   Plan:   1. Continue any current medications as instructed by provider. 2. Wound care discussed. 3. Operative findings again reviewed. Pathology report discussed.  Discussed option of ovarian suppression due to history of recurrent cysts and irregular cycles.  Patient hesitant to try birth control due to her family history of breast cancer and positive gene.  I discussed that she could utilize a progestin which would be less likely to lead to cancer and help to prevent her ovarian cyst.  Will prescribe Aygestin. 4. Activity restrictions:  none.  5. Anticipated return to work: now. 6. Follow up: 8-9  months  for annual exam.    Hildred Laser, MD [facility]

## 2023-06-10 NOTE — Progress Notes (Signed)
    OBSTETRICS/GYNECOLOGY POST-OPERATIVE CLINIC VISIT  Subjective:     Isabel [name] is a 32 y.o. female who presents to the clinic 4 weeks status post  LAPAROSCOPIC OVARIAN CYSTECTOMY  for  Left Ovarian Cyst . She has an incision underneath her abdomen that is leaking. Notes that she attempted to cover with a bandage, but then when pulling the bandage off, tore some of the surrounding skin, so now the entire area is irritated.  Has been using an antibiotic ointment for the past few days.   The following portions of the patient's history were reviewed and updated as appropriate: allergies, current medications, past family history, past medical history, past social history, past surgical history, and problem list.  Review of Systems Pertinent items noted in HPI and remainder of comprehensive ROS otherwise negative.   Objective:   BP 103/77   Pulse 79   Resp 16   Ht 5\' 4"  (1.626 m)   Wt 201 lb (91.2 kg)   BMI 34.50 kg/m  Body mass index is 34.5 kg/m.  General:  alert and no distress  Abdomen: soft, bowel sounds active, non-tender  Incision:   healing well, no drainage, no erythema, no hernia, no seroma, no swelling, no dehiscence, incision well approximated Another area ~ 2 cm lateral to incision site on the right, with mild erythema, peeling skin.     Assessment:   S/p laparoscopy Wound check   Plan:   1. Advised that overall incision was healed, may have just pulled the scab which lead to a small amount of leaking. Can continue to use antibiotic ointment for the next few days, and avoid wearing pants/underwear at incision site to prevent rubbing/or cover with bandage or gauze for the next several days.   Return to clinic for any scheduled appointments or for any gynecologic concerns as needed.      Hildred Laser, MD [facility]

## 2023-06-11 ENCOUNTER — Encounter: Payer: Self-pay | Admitting: Obstetrics and Gynecology

## 2023-06-11 ENCOUNTER — Ambulatory Visit (INDEPENDENT_AMBULATORY_CARE_PROVIDER_SITE_OTHER): Payer: BC Managed Care – PPO | Admitting: Obstetrics and Gynecology

## 2023-06-11 VITALS — BP 103/77 | HR 79 | Resp 16 | Ht 64.0 in | Wt 201.0 lb

## 2023-06-11 DIAGNOSIS — Z4889 Encounter for other specified surgical aftercare: Secondary | ICD-10-CM

## 2023-06-11 DIAGNOSIS — Z9889 Other specified postprocedural states: Secondary | ICD-10-CM

## 2023-07-15 ENCOUNTER — Ambulatory Visit (INDEPENDENT_AMBULATORY_CARE_PROVIDER_SITE_OTHER): Payer: No Typology Code available for payment source | Admitting: Family

## 2023-07-15 ENCOUNTER — Encounter: Payer: Self-pay | Admitting: Family

## 2023-07-15 VITALS — BP 113/76 | HR 93 | Temp 98.0°F | Ht 64.0 in | Wt 204.6 lb

## 2023-07-15 DIAGNOSIS — U071 COVID-19: Secondary | ICD-10-CM

## 2023-07-15 NOTE — Progress Notes (Signed)
Assessment & Plan:  COVID-19 Assessment & Plan: Cough resolved.  Patient afebrile.  Reassuring exam.  Reassured patient I suspect bilateral chest wall tenderness related to cough, COVID sequela.  Offered chest x-ray with rib series, patient politely declines.  She feels comfortable monitoring.      Return precautions given.   Risks, benefits, and alternatives of the medications and treatment plan prescribed today were discussed, and patient expressed understanding.   Education regarding symptom management and diagnosis given to patient on AVS either electronically or printed.  No follow-ups on file.  Rennie Plowman, FNP  Subjective:    Patient ID: Isabel [name], female    DOB: [birth year], 32 y.o.   MRN: [id number]  CC: Isabel [name] is a 32 y.o. female who presents today for an acute visit.    HPI: Complains of bilateral side of ribs being sore x 17 days, improved. Onset when cough started.    Soreness with deep breath.  Cough has resolved. Covid positive 17 days ago.    No fever, nasal congestion, ear pain, rash, sob.   She is taking otc cough suppresant   Vapes.   No h/o asthma      Allergies: Amoxicillin and Penicillins Current Outpatient Medications on File Prior to Visit  Medication Sig Dispense Refill   busPIRone (BUSPAR) 10 MG tablet TAKE 1 TABLET BY MOUTH THREE TIMES A DAY 270 tablet 3   gabapentin (NEURONTIN) 100 MG capsule Take 2 capsules (200 mg total) by mouth 2 (two) times daily. 360 capsule 3   norethindrone (AYGESTIN) 5 MG tablet Take 1 tablet (5 mg total) by mouth daily. 90 tablet 3   nortriptyline (PAMELOR) 10 MG capsule TAKE 1 CAPSULE BY MOUTH AT BEDTIME 90 capsule 3   [DISCONTINUED] famotidine (PEPCID) 20 MG tablet Take 1 tablet (20 mg total) by mouth 2 (two) times daily. 60 tablet 0   No current facility-administered medications on file prior to visit.    Review of Systems  Constitutional:  Negative for chills and fever.   HENT:  Negative for congestion.   Respiratory:  Negative for cough (resolved), shortness of breath and wheezing.   Cardiovascular:  Negative for chest pain, palpitations and leg swelling.  Gastrointestinal:  Negative for nausea and vomiting.      Objective:    BP 113/76   Pulse 93   Temp 98 F (36.7 C) (Oral)   Ht 5\' 4"  (1.626 m)   Wt 204 lb 9.6 oz (92.8 kg)   SpO2 99%   BMI 35.12 kg/m   BP Readings from Last 3 Encounters:  07/15/23 113/76  06/11/23 103/77  05/18/23 119/75   Wt Readings from Last 3 Encounters:  07/15/23 204 lb 9.6 oz (92.8 kg)  06/11/23 201 lb (91.2 kg)  05/18/23 198 lb (89.8 kg)    Physical Exam Vitals reviewed.  Constitutional:      Appearance: She is well-developed.  Eyes:     Conjunctiva/sclera: Conjunctivae normal.  Cardiovascular:     Rate and Rhythm: Normal rate and regular rhythm.     Pulses: Normal pulses.     Heart sounds: Normal heart sounds.  Pulmonary:     Effort: Pulmonary effort is normal.     Breath sounds: Normal breath sounds. No wheezing, rhonchi or rales.  Chest:     Chest wall: No tenderness.     Comments: No tenderness with deep palpation of chest wall.  No bony step-off, edema, rash . no pain with inspiration Skin:  General: Skin is warm and dry.  Neurological:     Mental Status: She is alert.  Psychiatric:        Speech: Speech normal.        Behavior: Behavior normal.        Thought Content: Thought content normal.

## 2023-07-19 DIAGNOSIS — U071 COVID-19: Secondary | ICD-10-CM | POA: Insufficient documentation

## 2023-07-19 NOTE — Assessment & Plan Note (Signed)
Cough resolved.  Patient afebrile.  Reassuring exam.  Reassured patient I suspect bilateral chest wall tenderness related to cough, COVID sequela.  Offered chest x-ray with rib series, patient politely declines.  She feels comfortable monitoring.

## 2023-07-22 ENCOUNTER — Ambulatory Visit: Payer: No Typology Code available for payment source | Admitting: Family

## 2023-07-29 ENCOUNTER — Telehealth: Payer: BC Managed Care – PPO | Admitting: Physician Assistant

## 2023-07-29 ENCOUNTER — Encounter: Payer: Self-pay | Admitting: Family

## 2023-07-29 DIAGNOSIS — J019 Acute sinusitis, unspecified: Secondary | ICD-10-CM | POA: Diagnosis not present

## 2023-07-29 DIAGNOSIS — B9689 Other specified bacterial agents as the cause of diseases classified elsewhere: Secondary | ICD-10-CM

## 2023-07-29 MED ORDER — DOXYCYCLINE HYCLATE 100 MG PO TABS
100.0000 mg | ORAL_TABLET | Freq: Two times a day (BID) | ORAL | 0 refills | Status: DC
Start: 2023-07-29 — End: 2023-08-16

## 2023-07-29 NOTE — Progress Notes (Signed)

## 2023-07-29 NOTE — Progress Notes (Signed)
Message sent to patient requesting further input regarding current symptoms. Awaiting patient response.  

## 2023-07-29 NOTE — Progress Notes (Signed)
I have spent 5 minutes in review of e-visit questionnaire, review and updating patient chart, medical decision making and response to patient.   Mia Milan Cody Jacklynn Dehaas, PA-C    

## 2023-08-13 NOTE — Progress Notes (Unsigned)
NEUROLOGY FOLLOW UP OFFICE NOTE  Isabel [name] [id number]  Assessment/Plan:   Migraine with and without aura   Migraine prevention:  nortriptyline 10mg  at bedtime *** Migraine rescue:  rizatriptan 10mg  Limit use of pain relievers to no more than 2 days out of week to prevent risk of rebound or medication-overuse headache. Keep headache diary Follow up 1 year   Subjective:  Isabel [name]. [place] is a 32 year old right-handed female who follows up for migraines.   UPDATE: Intensity:  Mild, severe Duration:  30 minutes with rizatriptan Frequency:  2 in 30 days    Frequency of abortive medication: 1 to 2 days a month Current NSAIDS/analgesics:  Children's tylenol Current triptans:  rizatriptan 10mg  Current ergotamine:  none Current anti-emetic:  none Current muscle relaxants:  none Current Antihypertensive medications:  none Current Antidepressant medications:  Nortriptyline 10mg  at bedtime, Current Anticonvulsant medications:  gabapentin 200mg  BID (sciatica) Current anti-CGRP:  Aimovig 140mg  Current Vitamins/Herbal/Supplements:  none Current Antihistamines/Decongestants:  none Other therapy:  none   HISTORY:  She has remote history of migraines several years ago but then resolved.  She started getting new headaches in 2021.  She describes a diffuse severe throbbing headache with pins and needles sensation around her right eye with severe right eye pressure, that sometimes radiates to back of her neck.  It occurs daily but fluctuates.  Sometimes she sees spots in both eyes.  She sometimes has nausea but no nausea.  Photophobia but no photophobia.   Sometimes she gets a paroxysmal stabbing pain in the mild-occipital region, occurring every now and then.  She started feeling numbness and tingling in the arms.  MRI of cervical spine from 03/17/2020 was unremarkable.  However, it did show evidence of empty sella.  MRA of neck was negative. She subsequently saw a chiropractor and  neck pain and arm numbness has improved.  She was treated for recurrent ear infections.  She subsequently saw ENT who told her she did not have ear infection but that she should see neurology due to the empty sella finding on cervical MRI.  She denies visual obscurations and pulsatile tinnitus.  She does have high-frequency tinnitus.  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  She does endorse mild transient tinnitus.  To follow up on the incidental vestibular schwannoma, she had repeat MRI of brain/IAC with and without contrast on 03/05/2022, which showed stable partial empty sella but was negative for vestibular schwannoma.  The previous enhancement was likely vascular.    Referred to ophthalmology.  She saw Dr. Alben Spittle in November.  Exhibited dry eyes but no papilledema.     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 20 tablet 0   gabapentin (NEURONTIN) 100 MG capsule Take 2 capsules (200 mg total) by mouth 2 (two) times daily. 360 capsule 3   norethindrone (AYGESTIN) 5 MG tablet Take 1 tablet (5 mg total) by mouth daily. 90 tablet 3    nortriptyline (PAMELOR) 10 MG capsule TAKE 1 CAPSULE BY MOUTH AT BEDTIME 90 capsule 3   [DISCONTINUED] famotidine (PEPCID) 20 MG tablet Take 1 tablet (20 mg total) by mouth 2 (two) times daily. 60 tablet 0   No current facility-administered medications on file prior to visit.      ALLERGIES: Allergies  Allergen Reactions   Amoxicillin Hives   Penicillins Hives    FAMILY HISTORY: Family History  Problem Relation Age of Onset   Breast cancer Mother 47       currently 62   Hypertension Father    Arthritis Other    Diabetes Other    Cancer Other    Diabetes Maternal Grandmother    Diabetes Maternal Grandfather    Diabetes Paternal Grandmother    Diabetes Paternal Grandfather    Breast cancer Maternal Aunt 50       currently 76; PALB2 mutation   Colon cancer Neg Hx    Heart disease Neg Hx       Objective:  *** General: No acute distress.  Patient appears well-groomed.   Head:  Normocephalic/atraumatic Eyes:  Fundi examined but not visualized Neck: supple, no paraspinal tenderness, full range of motion Heart:  Regular rate and rhythm Neurological Exam: alert and oriented.  Speech fluent and not dysarthric, language intact.  CN II-XII intact. Bulk and tone normal, muscle strength 5/5 throughout.  Sensation to light touch intact.  Deep tendon reflexes 2+ throughout.  Finger to nose testing intact.  Gait normal, Romberg negative.   Isabel Millet, DO  CC: Rennie Plowman, FNP

## 2023-08-16 ENCOUNTER — Encounter: Payer: Self-pay | Admitting: Neurology

## 2023-08-16 ENCOUNTER — Ambulatory Visit (INDEPENDENT_AMBULATORY_CARE_PROVIDER_SITE_OTHER): Payer: No Typology Code available for payment source | Admitting: Neurology

## 2023-08-16 ENCOUNTER — Telehealth: Payer: Self-pay

## 2023-08-16 VITALS — BP 104/74 | HR 84 | Ht 64.0 in | Wt 206.0 lb

## 2023-08-16 DIAGNOSIS — G43009 Migraine without aura, not intractable, without status migrainosus: Secondary | ICD-10-CM | POA: Diagnosis not present

## 2023-08-16 MED ORDER — EMGALITY 120 MG/ML ~~LOC~~ SOAJ
120.0000 mg | SUBCUTANEOUS | 11 refills | Status: DC
Start: 2023-08-16 — End: 2023-12-15

## 2023-08-16 MED ORDER — NORTRIPTYLINE HCL 10 MG PO CAPS: ORAL_CAPSULE | ORAL | 0 refills | Status: DC

## 2023-08-16 NOTE — Progress Notes (Signed)
Medication Samples have been provided to the patient.  Drug name: Emgality       Strength: 120 mg        Qty: 1  LOT: [id number]  Exp.Date: 04/07/25  Dosing instructions: Once  The patient has been instructed regarding the correct time, dose, and frequency of taking this medication, including desired effects and most common side effects.   Leida Lauth 10:32 AM 08/16/2023

## 2023-08-16 NOTE — Telephone Encounter (Signed)
Per Patient mychart message, Emgality needs a Prior [employer]

## 2023-08-16 NOTE — Patient Instructions (Signed)
Take emgality sample first - 2 injections.  Then 1 injection every 28 days thereafter.  Continue nortriptyline.  If doing well in 3 months, then discontinue (send me MyChart message to let me know) Rizatriptan as needed for migraine attacks. Limit use of pain relievers to no more than 2 days out of week to prevent risk of rebound or medication-overuse headache. Keep headache diary Follow up 6 months.

## 2023-08-25 ENCOUNTER — Other Ambulatory Visit: Payer: Self-pay | Admitting: Family

## 2023-08-25 ENCOUNTER — Encounter: Payer: Self-pay | Admitting: Family

## 2023-08-25 DIAGNOSIS — F419 Anxiety disorder, unspecified: Secondary | ICD-10-CM

## 2023-09-08 ENCOUNTER — Telehealth: Payer: Self-pay

## 2023-09-08 NOTE — Telephone Encounter (Signed)
PA request has been Submitted. New Encounter created for follow up. For additional info see Pharmacy Prior Auth telephone encounter from 10/30.

## 2023-09-08 NOTE — Telephone Encounter (Signed)
*  [facility]  Pharmacy Patient Advocate Encounter   Received notification from Pt Calls Messages that prior authorization for Emgality 120MG /ML auto-injectors (migraine)  is required/requested.   Insurance verification completed.   The patient is insured through [facility] .   Per test claim: PA required; PA started via CoverMyMeds. KEY [id number] . Waiting for clinical questions to populate.

## 2023-09-10 NOTE — Telephone Encounter (Signed)
Questions populated and submitted

## 2023-09-11 ENCOUNTER — Other Ambulatory Visit: Payer: Self-pay | Admitting: Family

## 2023-09-11 DIAGNOSIS — M5441 Lumbago with sciatica, right side: Secondary | ICD-10-CM

## 2023-09-20 ENCOUNTER — Other Ambulatory Visit (HOSPITAL_COMMUNITY): Payer: Self-pay

## 2023-09-23 ENCOUNTER — Other Ambulatory Visit (HOSPITAL_COMMUNITY): Payer: Self-pay

## 2023-09-23 NOTE — Telephone Encounter (Addendum)
Pt's ins doesn't process through [facility].  Working on resubmitting through [employer].  EOC ID [id number] Prior Authorization form/request asks a question that requires your assistance. Please see the question below and advise accordingly.  (See what I have so far- Has the pt been on other medications that I'm just not seeing? I just want to make sure before I submit.- Since I don't see that she's been on a beta-blocker, I wasn't sure how to answer the 3rd question, since I didn't see a notation about it)

## 2023-09-27 ENCOUNTER — Other Ambulatory Visit (HOSPITAL_COMMUNITY): Payer: Self-pay

## 2023-09-27 NOTE — Telephone Encounter (Signed)
Migraine Questionnaire has been completed with additional information provided by Dr. Adriana Mccallum and faxed to [phone].  Dellie Burns, PharmD Clinical Pharmacist   Direct Dial: [phone]   .

## 2023-09-27 NOTE — Telephone Encounter (Signed)
Thank you Dr. Everlena Cooper. I'll call and get this information put in. It appears the system this ins uses for PAs isn't letting me pull up the form I started to finish submitting it electronically.

## 2023-09-29 ENCOUNTER — Other Ambulatory Visit (HOSPITAL_COMMUNITY): Payer: Self-pay

## 2023-09-29 NOTE — Telephone Encounter (Signed)
Pharmacy Patient Advocate Encounter  Received notification from [facility] that Prior Authorization for Emgality 120mg /ml has been APPROVED from 09/29/2023 to 12/30/2023   PA #/Case ID/Reference #: [id number]

## 2023-10-13 ENCOUNTER — Telehealth: Payer: No Typology Code available for payment source | Admitting: Family Medicine

## 2023-10-13 DIAGNOSIS — J069 Acute upper respiratory infection, unspecified: Secondary | ICD-10-CM

## 2023-10-13 MED ORDER — PSEUDOEPH-BROMPHEN-DM 30-2-10 MG/5ML PO SYRP
5.0000 mL | ORAL_SOLUTION | Freq: Four times a day (QID) | ORAL | 0 refills | Status: DC | PRN
Start: 2023-10-13 — End: 2023-12-15

## 2023-10-13 NOTE — Progress Notes (Signed)
Virtual Visit Consent   Isabel [name], you are scheduled for a virtual visit with a [facility] provider today. Just as with appointments in the office, your consent must be obtained to participate. Your consent will be active for this visit and any virtual visit you may have with one of our providers in the next 365 days. If you have a MyChart account, a copy of this consent can be sent to you electronically.  As this is a virtual visit, video technology does not allow for your provider to perform a traditional examination. This may limit your provider's ability to fully assess your condition. If your provider identifies any concerns that need to be evaluated in person or the need to arrange testing (such as labs, EKG, etc.), we will make arrangements to do so. Although advances in technology are sophisticated, we cannot ensure that it will always work on either your end or our end. If the connection with a video visit is poor, the visit may have to be switched to a telephone visit. With either a video or telephone visit, we are not always able to ensure that we have a secure connection.  By engaging in this virtual visit, you consent to the provision of healthcare and authorize for your insurance to be billed (if applicable) for the services provided during this visit. Depending on your insurance coverage, you may receive a charge related to this service.  I need to obtain your verbal consent now. Are you willing to proceed with your visit today? [name] has provided verbal consent on 10/13/2023 for a virtual visit (video or telephone). Freddy Finner, NP  Date: 10/13/2023 10:55 AM  Virtual Visit via Video Note   I, Freddy Finner, connected with  Isabel [name]  ([id number], 1991-07-01) on 10/13/23 at 11:45 AM EST by a video-enabled telemedicine application and verified that I am speaking with the correct person using two identifiers.  Location: Patient: Virtual Visit Location  Patient: Home Provider: Virtual Visit Location Provider: Home Office   I discussed the limitations of evaluation and management by telemedicine and the availability of in person appointments. The patient expressed understanding and agreed to proceed.    History of Present Illness: Isabel [name] is a 32 y.o. who identifies as a female who was assigned female at birth, and is being seen today for sinus congestion  Onset was a few days ago with sore throat and has progressed to congestion and sinus pressure- green nasal drainage. Associated symptoms are as stated above Modifying factors are claritin, tylneol cold and flu  Denies chest pain, shortness of breath, fevers, chills  Exposure to sick contacts- known - mother  COVID test: neg, had COVid in sept  Vaccines: Flu no    Problems:  Patient Active Problem List   Diagnosis Date Noted   COVID-19 07/19/2023   Pelvic pain 03/04/2023   Tick bite 01/27/2023   Galactorrhea 09/30/2022   Chronic dysfunction of right eustachian tube 06/26/2022   Dysmenorrhea 06/26/2022   Body mass index (BMI) of 30.0-30.9 in adult 11/27/2021   Migraines    Strain of rhomboid muscle 07/10/2021   Body mass index 27.0-27.9, adult 02/25/2021   Chronic venous insufficiency 07/03/2020   Moderate depressive disorder 06/06/2020   Pharyngitis 04/15/2020   Seasonal allergies 04/11/2020   Aberrant right subclavian artery 03/14/2020   Anxiety 03/14/2020   RUQ pain 03/14/2020   Periumbilical abdominal pain 03/14/2020   History of laparoscopic cholecystectomy 03/14/2020  Right lower quadrant abdominal pain 03/14/2020   Corpus luteum cyst of right ovary 03/14/2020   Umbilical hernia without obstruction or gangrene 03/14/2020   Genetic testing    Monoallelic mutation of PALB2 gene    Family history of breast cancer in mother    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/24/2016   Tobacco user 02/26/2016   Irregular periods/menstrual cycles  02/26/2016   Neck pain 02/22/2012   Epigastric abdominal pain 09/25/2008   LOW BACK PAIN 08/28/2008   VASOVAGAL SYNCOPE 08/28/2008    Allergies:  Allergies  Allergen Reactions   Amoxicillin Hives   Penicillins Hives   Medications:  Current Outpatient Medications:    busPIRone (BUSPAR) 10 MG tablet, TAKE 1 TABLET BY MOUTH THREE TIMES A DAY, Disp: 270 tablet, Rfl: 3   gabapentin (NEURONTIN) 100 MG capsule, TAKE 2 CAPSULES BY MOUTH 2 TIMES DAILY., Disp: 360 capsule, Rfl: 1   Galcanezumab-gnlm (EMGALITY) 120 MG/ML SOAJ, Inject 120 mg into the skin every 28 (twenty-eight) days., Disp: 1.12 mL, Rfl: 11   nortriptyline (PAMELOR) 10 MG capsule, TAKE 1 CAPSULE BY MOUTH AT BEDTIME, Disp: 90 capsule, Rfl: 0  Observations/Objective: Patient is well-developed, well-nourished in no acute distress.  Resting comfortably  at home.  Head is normocephalic, atraumatic.  No labored breathing.  Speech is clear and coherent with logical content.  Patient is alert and oriented at baseline.    Assessment and Plan:  1. Viral URI with cough  - brompheniramine-pseudoephedrine-DM 30-2-10 MG/5ML syrup; Take 5 mLs by mouth 4 (four) times daily as needed.  Dispense: 120 mL; Refill: 0  URI recommendations: - Increased rest - Increasing Fluids - Acetaminophen / ibuprofen as needed for fever/pain.  - Salt water gargling, chloraseptic spray and throat lozenges - Mucinex if mucus is present and increasing.  - Saline nasal spray if congestion or if nasal passages feel dry. - Humidifying the air.     Reviewed side effects, risks and benefits of medication.    Patient acknowledged agreement and understanding of the plan.   Past Medical, Surgical, Social History, Allergies, and Medications have been Reviewed.      Follow Up Instructions: I discussed the assessment and treatment plan with the patient. The patient was provided an opportunity to ask questions and all were answered. The patient agreed  with the plan and demonstrated an understanding of the instructions.  A copy of instructions were sent to the patient via MyChart unless otherwise noted below.    The patient was advised to call back or seek an in-person evaluation if the symptoms worsen or if the condition fails to improve as anticipated.    Freddy Finner, NP

## 2023-10-13 NOTE — Patient Instructions (Signed)
  Jenean Lindau, thank you for joining Freddy Finner, NP for today's virtual visit.  While this provider is not your primary care provider (PCP), if your PCP is located in our provider database this encounter information will be shared with them immediately following your visit.   A [facility] MyChart account gives you access to today's visit and all your visits, tests, and labs performed at [facility] " click here if you don't have a [facility] MyChart account or go to [email or web address]  Consent: (Patient) Isabel [name] provided verbal consent for this virtual visit at the beginning of the encounter.  Current Medications:  Current Outpatient Medications:    brompheniramine-pseudoephedrine-DM 30-2-10 MG/5ML syrup, Take 5 mLs by mouth 4 (four) times daily as needed., Disp: 120 mL, Rfl: 0   busPIRone (BUSPAR) 10 MG tablet, TAKE 1 TABLET BY MOUTH THREE TIMES A DAY, Disp: 270 tablet, Rfl: 3   gabapentin (NEURONTIN) 100 MG capsule, TAKE 2 CAPSULES BY MOUTH 2 TIMES DAILY., Disp: 360 capsule, Rfl: 1   Galcanezumab-gnlm (EMGALITY) 120 MG/ML SOAJ, Inject 120 mg into the skin every 28 (twenty-eight) days., Disp: 1.12 mL, Rfl: 11   nortriptyline (PAMELOR) 10 MG capsule, TAKE 1 CAPSULE BY MOUTH AT BEDTIME, Disp: 90 capsule, Rfl: 0   Medications ordered in this encounter:  Meds ordered this encounter  Medications   brompheniramine-pseudoephedrine-DM 30-2-10 MG/5ML syrup    Sig: Take 5 mLs by mouth 4 (four) times daily as needed.    Dispense:  120 mL    Refill:  0    Order Specific Question:   Supervising Provider    Answer:   Merrilee Jansky [[id number]]     *If you need refills on other medications prior to your next appointment, please contact your pharmacy*  Follow-Up: Call back or seek an in-person evaluation if the symptoms worsen or if the condition fails to improve as anticipated.  [facility] [phone]  Other Instructions  URI  recommendations: - Increased rest - Increasing Fluids - Acetaminophen / ibuprofen as needed for fever/pain.  - Salt water gargling, chloraseptic spray and throat lozenges - Mucinex if mucus is present and increasing.  - Saline nasal spray if congestion or if nasal passages feel dry. - Humidifying the air.     If you have been instructed to have an in-person evaluation today at a local Urgent Care facility, please use the link below. It will take you to a list of all of our available [facility], including address, phone number and hours of operation. Please do not delay care.  [facility]  If you or a family member do not have a primary care provider, use the link below to schedule a visit and establish care. When you choose a [place] primary care physician or advanced practice provider, you gain a long-term partner in health. Find a Primary Care Provider  Learn more about [facility]'s in-office and virtual care options: [facility] - Get Care Now

## 2023-11-19 ENCOUNTER — Other Ambulatory Visit: Payer: Self-pay | Admitting: Neurology

## 2023-12-15 ENCOUNTER — Ambulatory Visit: Payer: BC Managed Care – PPO | Admitting: Family

## 2023-12-15 ENCOUNTER — Encounter: Payer: Self-pay | Admitting: Family

## 2023-12-15 VITALS — BP 116/70 | HR 99 | Temp 98.0°F | Ht 64.0 in | Wt 203.8 lb

## 2023-12-15 DIAGNOSIS — Z6834 Body mass index (BMI) 34.0-34.9, adult: Secondary | ICD-10-CM

## 2023-12-15 DIAGNOSIS — F419 Anxiety disorder, unspecified: Secondary | ICD-10-CM | POA: Diagnosis not present

## 2023-12-15 DIAGNOSIS — Z6830 Body mass index (BMI) 30.0-30.9, adult: Secondary | ICD-10-CM

## 2023-12-15 DIAGNOSIS — M5441 Lumbago with sciatica, right side: Secondary | ICD-10-CM

## 2023-12-15 LAB — TSH: TSH: 2.01 u[IU]/mL (ref 0.35–5.50)

## 2023-12-15 MED ORDER — GABAPENTIN 100 MG PO CAPS
ORAL_CAPSULE | ORAL | 1 refills | Status: DC
Start: 2023-12-15 — End: 2024-09-12

## 2023-12-15 MED ORDER — METFORMIN HCL ER 500 MG PO TB24
500.0000 mg | ORAL_TABLET | Freq: Every evening | ORAL | 2 refills | Status: DC
Start: 2023-12-15 — End: 2024-07-18

## 2023-12-15 MED ORDER — BUSPIRONE HCL 5 MG PO TABS
5.0000 mg | ORAL_TABLET | Freq: Every day | ORAL | 3 refills | Status: DC
Start: 2023-12-15 — End: 2024-03-09

## 2023-12-15 NOTE — Patient Instructions (Addendum)
 Increase buspar  at night to 15mg    Metformin  is used in prediabetes, diabetes, and also for weight loss by decreasing calorie consumption.   It works in a couple of ways by decreasing liver glucose production, decreases intestinal absorption of glucose and improves insulin  sensitivity (increases peripheral glucose uptake and utilization).     Please make sure that you titrate per below so not to cause any GI upset.    Start metformin  XR with one 500mg  tablet at night. After one week, you may increase to two tablets at night ( total of 1000mg ) . The third week, you may take take two tablets at night and one tablet in the morning.  The fourth week, you may take two tablets in the morning ( 1000mg  total) and two tablets at night (1000mg  total). This will bring you to a maximum daily dose of 2000mg /day which is maximum dose.  So you are aware,  you may take ALL 4 tablets of metformin  together at the same time if preferable and doesn't cause GI upset. You may take metformin  2000mg  ( four of the 500mg  tablets) together in the morning or at night if you prefer.   Along the way, if you want to increase more slowly, please do as this medication can cause GI discomfort and loose stools which usually get better with time , however some patients find that they can only tolerate a certain dose and cannot increase to maximum dose.   Start magnesium citrate 400mg  daily for sleep, headache prevention.    Please let me know how you are doing.

## 2023-12-15 NOTE — Progress Notes (Signed)
 Assessment & Plan:  Anxiety Assessment & Plan: Suboptimal control as anxiety appears to be interfering with sleep.  Increase BuSpar  to 15 mg nightly.  She will continue BuSpar  10 mg in the morning, 10 mg midday.  Consider trazodone   Orders: -     busPIRone  HCl; Take 1 tablet (5 mg total) by mouth at bedtime. Take with 10mg  buspar   Dispense: 90 tablet; Refill: 3  Low back pain with right-sided sciatica, unspecified back pain laterality, unspecified chronicity -     Gabapentin ; Take  two 100 mg capsules by mouth 2 times daily  Dispense: 360 capsule; Refill: 1  Body mass index (BMI) of 30.0-30.9 in adult Assessment & Plan: Start metformin  500 mg and titrate.  Counseled on mechanism action, side effects.  Counseled on creating caloric deficit. close follow-up  Orders: -     metFORMIN  HCl ER; Take 1 tablet (500 mg total) by mouth every evening.  Dispense: 90 tablet; Refill: 2 -     Insulin , random -     TSH  BMI 34.0-34.9,adult Assessment & Plan: Start metformin  500 mg and titrate.  Counseled on mechanism action, side effects.  Counseled on creating caloric deficit. close follow-up      Return precautions given.   Risks, benefits, and alternatives of the medications and treatment plan prescribed today were discussed, and patient expressed understanding.   Education regarding symptom management and diagnosis given to patient on AVS either electronically or printed.  Return in about 6 weeks (around 01/26/2024).  Isabel Northern, FNP  Subjective:    Patient ID: Isabel [name], female    DOB: 29-Jul-1991, 33 y.o.   MRN: [id number]  CC: Isabel [name] is a 33 y.o. female who presents today for an acute visit.    HPI: Here to discuss trouble staying and falling asleep.   She tried white noise, melatonin without relief.   She finds herself worried, especially as her husband travels.  Compliant with BuSpar  10 mg 3 times daily.  She also takes nortriptyline  , gabapentin   however does not find this sedating.  She is frustrated by weight gain and difficulty losing weight.  She is interested in medication for weight loss.   Exercise consists of walking up flights of stairs.   Ear ache resolved.  Allergies: Amoxicillin and Penicillins Current Outpatient Medications on File Prior to Visit  Medication Sig Dispense Refill   busPIRone  (BUSPAR ) 10 MG tablet TAKE 1 TABLET BY MOUTH THREE TIMES A DAY 270 tablet 3   nortriptyline  (PAMELOR ) 10 MG capsule TAKE 1 CAPSULE BY MOUTH EVERYDAY AT BEDTIME 90 capsule 0   [DISCONTINUED] famotidine  (PEPCID ) 20 MG tablet Take 1 tablet (20 mg total) by mouth 2 (two) times daily. 60 tablet 0   No current facility-administered medications on file prior to visit.    Review of Systems  Constitutional:  Negative for chills and fever.  HENT:  Negative for congestion and ear pain.   Respiratory:  Negative for cough.   Cardiovascular:  Negative for chest pain and palpitations.  Gastrointestinal:  Negative for nausea and vomiting.  Psychiatric/Behavioral:  Positive for sleep disturbance. The patient is nervous/anxious.       Objective:    BP 116/70   Pulse 99   Temp 98 F (36.7 C) (Oral)   Ht 5' 4 (1.626 m)   Wt 203 lb 12.8 oz (92.4 kg)   LMP  (LMP Unknown)   SpO2 99%   BMI 34.98 kg/m   BP Readings from  Last 3 Encounters:  12/15/23 116/70  08/16/23 104/74  07/15/23 113/76   Wt Readings from Last 3 Encounters:  12/15/23 203 lb 12.8 oz (92.4 kg)  08/16/23 206 lb (93.4 kg)  07/15/23 204 lb 9.6 oz (92.8 kg)      12/15/2023   10:36 AM 12/15/2023   10:35 AM 07/15/2023   12:06 PM  Depression screen PHQ 2/9  Decreased Interest 0 0 0  Down, Depressed, Hopeless 0 0 0  PHQ - 2 Score 0 0 0  Altered sleeping 3    Tired, decreased energy 1    Change in appetite 0    Feeling bad or failure about yourself  0    Trouble concentrating 0    Moving slowly or fidgety/restless 0    Suicidal thoughts 0    PHQ-9 Score 4     Difficult doing work/chores Not difficult at all      Physical Exam Vitals reviewed.  Constitutional:      Appearance: She is well-developed.  Eyes:     Conjunctiva/sclera: Conjunctivae normal.  Cardiovascular:     Rate and Rhythm: Normal rate and regular rhythm.     Pulses: Normal pulses.     Heart sounds: Normal heart sounds.  Pulmonary:     Effort: Pulmonary effort is normal.     Breath sounds: Normal breath sounds. No wheezing, rhonchi or rales.  Skin:    General: Skin is warm and dry.  Neurological:     Mental Status: She is alert.  Psychiatric:        Speech: Speech normal.        Behavior: Behavior normal.        Thought Content: Thought content normal.

## 2023-12-16 ENCOUNTER — Encounter: Payer: Self-pay | Admitting: Family

## 2023-12-16 LAB — INSULIN, RANDOM: Insulin: 10.4 u[IU]/mL

## 2023-12-16 NOTE — Assessment & Plan Note (Signed)
 Start metformin  500 mg and titrate.  Counseled on mechanism action, side effects.  Counseled on creating caloric deficit. close follow-up

## 2023-12-16 NOTE — Assessment & Plan Note (Signed)
 Suboptimal control as anxiety appears to be interfering with sleep.  Increase BuSpar  to 15 mg nightly.  She will continue BuSpar  10 mg in the morning, 10 mg midday.  Consider trazodone

## 2024-01-06 ENCOUNTER — Other Ambulatory Visit: Payer: Self-pay | Admitting: Family

## 2024-01-06 DIAGNOSIS — Z20828 Contact with and (suspected) exposure to other viral communicable diseases: Secondary | ICD-10-CM

## 2024-01-06 MED ORDER — OSELTAMIVIR PHOSPHATE 75 MG PO CAPS: 75.0000 mg | ORAL_CAPSULE | Freq: Two times a day (BID) | ORAL | 0 refills | Status: DC

## 2024-01-06 MED ORDER — OSELTAMIVIR PHOSPHATE 75 MG PO CAPS
75.0000 mg | ORAL_CAPSULE | Freq: Every day | ORAL | 0 refills | Status: DC
Start: 2024-01-06 — End: 2024-01-06

## 2024-01-26 ENCOUNTER — Ambulatory Visit: Payer: No Typology Code available for payment source | Admitting: Family

## 2024-02-09 NOTE — Progress Notes (Deleted)
 NEUROLOGY FOLLOW UP OFFICE NOTE  Isabel [name] [id number]  Assessment/Plan:   Migraine with and without aura   Due to weight gain, will plan to transition from nortriptyline to emgality  Migraine prevention:  Start Emgality.  Remain on nortriptyline for now.  If doing well after 3 months, discontinue nortriptyline Migraine rescue:  rizatriptan 10mg  Limit use of pain relievers to no more than 2 days out of week to prevent risk of rebound or medication-overuse headache. Keep headache diary Follow up 6 months   Subjective:  Isabel [name]. Isabel [name] is a 33 year old right-handed female who follows up for migraines.   UPDATE: Due to weight gain, transitioned from nortriptyline to Emgality. Intensity:  Mild, severe Duration:  30 minutes with rizatriptan Frequency:  1 to 2 a month (they were daily pre-treatment) Concerned due to continued weight gain on nortriptyline   Frequency of abortive medication: 1 to 2 days a month Current NSAIDS/analgesics:  Children's tylenol Current triptans:  rizatriptan 10mg  Current ergotamine:  none Current anti-emetic:  none Current muscle relaxants:  none Current Antihypertensive medications:  none Current Antidepressant medications: none Current Anticonvulsant medications:  gabapentin 200mg  BID (sciatica) Current anti-CGRP:  Emgality Current Vitamins/Herbal/Supplements:  none Current Antihistamines/Decongestants:  none Other therapy:  none   HISTORY:  She has remote history of migraines several years ago but then resolved.  She started getting new headaches in 2021.  She describes a diffuse severe throbbing headache with pins and needles sensation around her right eye with severe right eye pressure, that sometimes radiates to back of her neck.  It occurs daily but fluctuates.  Sometimes she sees spots in both eyes.  She sometimes has nausea but no nausea.  Photophobia but no photophobia.   Sometimes she gets a paroxysmal stabbing pain in the  mild-occipital region, occurring every now and then.  She started feeling numbness and tingling in the arms.  MRI of cervical spine from 03/17/2020 was unremarkable.  However, it did show evidence of empty sella.  MRA of neck was negative. She subsequently saw a chiropractor and neck pain and arm numbness has improved.  She was treated for recurrent ear infections.  She subsequently saw ENT who told her she did not have ear infection but that she should see neurology due to the empty sella finding on cervical MRI.  She denies visual obscurations and pulsatile tinnitus.  She does have high-frequency tinnitus.  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  She does endorse mild transient tinnitus.  To follow up on the incidental vestibular schwannoma, she had repeat MRI of brain/IAC with and without contrast on 03/05/2022, which showed stable partial empty sella but was negative for vestibular schwannoma.  The previous enhancement was likely vascular.    Referred to ophthalmology.  She saw Dr. Alben Spittle in November.  Exhibited dry eyes but no papilledema.     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 10 capsule 0    busPIRone (BUSPAR) 10 MG tablet TAKE 1 TABLET BY MOUTH THREE TIMES A DAY 270 tablet 3   busPIRone (BUSPAR) 5 MG tablet Take 1 tablet (5 mg total) by mouth at bedtime. Take with 10mg  buspar 90 tablet 3   gabapentin (NEURONTIN) 100 MG capsule Take  two 100 mg capsules by mouth 2 times daily 360 capsule 1   metFORMIN (GLUCOPHAGE-XR) 500 MG 24 hr tablet Take 1 tablet (500 mg total) by mouth every evening. 90 tablet 2   nortriptyline (PAMELOR) 10 MG capsule TAKE 1 CAPSULE BY MOUTH EVERYDAY AT BEDTIME 90 capsule 0   [DISCONTINUED] famotidine (PEPCID) 20 MG tablet Take 1 tablet (20 mg total) by mouth 2 (two) times daily. 60 tablet 0   No current facility-administered medications on file prior to visit.      ALLERGIES: Allergies  Allergen Reactions   Amoxicillin Hives   Penicillins Hives    FAMILY HISTORY: Family History  Problem Relation Age of Onset   Breast cancer Mother 64       currently 89   Hypertension Father    Arthritis Other    Diabetes Other    Cancer Other    Diabetes Maternal Grandmother    Diabetes Maternal Grandfather    Diabetes Paternal Grandmother    Diabetes Paternal Grandfather    Breast cancer Maternal Aunt 50       currently 63; PALB2 mutation   Colon cancer Neg Hx    Heart disease Neg Hx       Objective:  *** General: No acute distress.  Patient appears well-groomed.   ***    Shon Millet, DO  CC: Rennie Plowman, FNP

## 2024-02-14 ENCOUNTER — Ambulatory Visit: Admitting: Neurology

## 2024-02-14 ENCOUNTER — Ambulatory Visit: Payer: No Typology Code available for payment source | Admitting: Neurology

## 2024-02-22 ENCOUNTER — Encounter: Payer: Self-pay | Admitting: *Deleted

## 2024-02-23 ENCOUNTER — Other Ambulatory Visit: Payer: Self-pay | Admitting: Family

## 2024-02-23 ENCOUNTER — Other Ambulatory Visit: Payer: Self-pay | Admitting: Neurology

## 2024-02-23 DIAGNOSIS — F419 Anxiety disorder, unspecified: Secondary | ICD-10-CM

## 2024-02-23 NOTE — Telephone Encounter (Signed)
 Noted.

## 2024-02-23 NOTE — Telephone Encounter (Signed)
 Called to see if pt is still taking Nortriptyline. Reading Dr. Festus Hubert notes she may not be taking it. Left message to call office

## 2024-03-09 ENCOUNTER — Encounter: Payer: Self-pay | Admitting: Family

## 2024-03-09 ENCOUNTER — Ambulatory Visit: Attending: Family

## 2024-03-09 ENCOUNTER — Ambulatory Visit (INDEPENDENT_AMBULATORY_CARE_PROVIDER_SITE_OTHER): Payer: Self-pay | Admitting: Family

## 2024-03-09 VITALS — BP 126/68 | HR 83 | Temp 98.0°F | Ht 64.0 in | Wt 205.6 lb

## 2024-03-09 DIAGNOSIS — F419 Anxiety disorder, unspecified: Secondary | ICD-10-CM

## 2024-03-09 DIAGNOSIS — R42 Dizziness and giddiness: Secondary | ICD-10-CM | POA: Diagnosis not present

## 2024-03-09 DIAGNOSIS — J029 Acute pharyngitis, unspecified: Secondary | ICD-10-CM

## 2024-03-09 MED ORDER — TRAZODONE HCL 50 MG PO TABS: 25.0000 mg | ORAL_TABLET | Freq: Every day | ORAL | 3 refills | Status: DC

## 2024-03-09 MED ORDER — BUSPIRONE HCL 10 MG PO TABS
10.0000 mg | ORAL_TABLET | Freq: Three times a day (TID) | ORAL | 3 refills | Status: AC
Start: 2024-03-09 — End: ?

## 2024-03-09 NOTE — Progress Notes (Signed)
 Assessment & Plan:  Pharyngitis, unspecified etiology Assessment & Plan: Duration 2 weeks.  Patient declines strep testing as symptom does not feel like previous episodes.  Advised continued use of Claritin.  She may start Mucinex for congestion.  If symptoms persist, recommend antibiotic   Anxiety Assessment & Plan: Chronic, stable.  Continue BuSpar  10 mg 3 times daily.  Start trazodone  25 to 50 mg nightly for insomnia.   Orders: -     busPIRone  HCl; Take 1 tablet (10 mg total) by mouth 3 (three) times daily.  Dispense: 270 tablet; Refill: 3  Dizziness Assessment & Plan: Episodic.  She is not orthostatic ( see flow sheet). Discussed patient with patient her low end blood pressure and potential for vasovagal syncope complicated by dehydration.  Advised to liberalize sodium, compression stockings and to limit exposure to hot environments such as shower, outdoor heat. Pending zio monitor, echocardiogram.   Orders: -     LONG TERM MONITOR (3-14 DAYS); Future -     ECHOCARDIOGRAM COMPLETE; Future  Other orders -     traZODone  HCl; Take 0.5-1 tablets (25-50 mg total) by mouth at bedtime.  Dispense: 30 tablet; Refill: 3     Return precautions given.   Risks, benefits, and alternatives of the medications and treatment plan prescribed today were discussed, and patient expressed understanding.   Education regarding symptom management and diagnosis given to patient on AVS either electronically or printed.  Return in about 6 weeks (around 04/20/2024) for Complete Physical Exam.  Bascom Bossier, FNP  Subjective:    Patient ID: Kathrynn Park, female    DOB: 1991-04-14, 33 y.o.   MRN: [id number]  CC: NOHELI BOETTNER is a 33 y.o. female who presents today for follow up.   HPI: She is taking buspar  10mg  TID  She continues to have trouble staying asleep.   She has yet to start metformin . She has made dietary changes and stopped drinking soda.  She has started claritin due  to sinus pressure , bilaterally x 2 weeks Endorses sore throat very mild sore throat.   NO fever, chills, ear pain, nasal congestion, cough.   Complains of episodic lightheadedness which has been ongoing for years. No dizziness today.  She reports in showers if she bends over she will see "white spots".  Last week she was walking and felt dizzy.  She sat down and this resolved.  No associated chest pain, syncopal episode, palpitations, sob  Drinks caffeine most days      Allergies: Amoxicillin and Penicillins Current Outpatient Medications on File Prior to Visit  Medication Sig Dispense Refill   gabapentin  (NEURONTIN ) 100 MG capsule Take  two 100 mg capsules by mouth 2 times daily 360 capsule 1   loratadine (CLARITIN) 10 MG tablet Take 10 mg by mouth daily.     metFORMIN  (GLUCOPHAGE -XR) 500 MG 24 hr tablet Take 1 tablet (500 mg total) by mouth every evening. 90 tablet 2   nortriptyline  (PAMELOR ) 10 MG capsule TAKE 1 CAPSULE BY MOUTH EVERYDAY AT BEDTIME 90 capsule 0   [DISCONTINUED] famotidine  (PEPCID ) 20 MG tablet Take 1 tablet (20 mg total) by mouth 2 (two) times daily. 60 tablet 0   No current facility-administered medications on file prior to visit.    Review of Systems  Constitutional:  Negative for chills and fever.  HENT:  Positive for sinus pressure and sore throat. Negative for ear pain, hearing loss and trouble swallowing.   Respiratory:  Negative for cough.  Cardiovascular:  Negative for chest pain and palpitations.  Gastrointestinal:  Negative for nausea and vomiting.  Neurological:  Positive for dizziness and light-headedness. Negative for syncope and weakness.  Psychiatric/Behavioral:  Positive for sleep disturbance. Negative for suicidal ideas. The patient is not nervous/anxious.       Objective:    BP 126/68   Pulse 83   Temp 98 F (36.7 C) (Oral)   Ht 5\' 4"  (1.626 m)   Wt 205 lb 9.6 oz (93.3 kg)   SpO2 97%   BMI 35.29 kg/m  BP Readings from Last 3  Encounters:  03/09/24 126/68  12/15/23 116/70  08/16/23 104/74   Wt Readings from Last 3 Encounters:  03/09/24 205 lb 9.6 oz (93.3 kg)  12/15/23 203 lb 12.8 oz (92.4 kg)  08/16/23 206 lb (93.4 kg)      03/09/2024   10:05 AM 03/09/2024   10:03 AM 12/15/2023   10:36 AM  Depression screen PHQ 2/9  Decreased Interest 0 0 0  Down, Depressed, Hopeless 0 0 0  PHQ - 2 Score 0 0 0  Altered sleeping 2  3  Tired, decreased energy 0  1  Change in appetite 0  0  Feeling bad or failure about yourself  0  0  Trouble concentrating 0  0  Moving slowly or fidgety/restless 0  0  Suicidal thoughts 0  0  PHQ-9 Score 2  4  Difficult doing work/chores Very difficult  Not difficult at all     Physical Exam Vitals reviewed.  Constitutional:      Appearance: She is well-developed.  HENT:     Head: Normocephalic and atraumatic.     Right Ear: Hearing, tympanic membrane, ear canal and external ear normal. No decreased hearing noted. No drainage, swelling or tenderness. No middle ear effusion. No foreign body. Tympanic membrane is not erythematous or bulging.     Left Ear: Hearing, tympanic membrane, ear canal and external ear normal. No decreased hearing noted. No drainage, swelling or tenderness.  No middle ear effusion. No foreign body. Tympanic membrane is not erythematous or bulging.     Nose: Nose normal. No rhinorrhea.     Right Sinus: No maxillary sinus tenderness or frontal sinus tenderness.     Left Sinus: No maxillary sinus tenderness or frontal sinus tenderness.     Mouth/Throat:     Pharynx: Uvula midline. Posterior oropharyngeal erythema present. No oropharyngeal exudate.     Tonsils: No tonsillar exudate or tonsillar abscesses.  Eyes:     Conjunctiva/sclera: Conjunctivae normal.  Cardiovascular:     Rate and Rhythm: Regular rhythm.     Pulses: Normal pulses.     Heart sounds: Normal heart sounds.  Pulmonary:     Effort: Pulmonary effort is normal.     Breath sounds: Normal breath  sounds. No wheezing, rhonchi or rales.  Lymphadenopathy:     Head:     Right side of head: No submental, submandibular, tonsillar, preauricular, posterior auricular or occipital adenopathy.     Left side of head: No submental, submandibular, tonsillar, preauricular, posterior auricular or occipital adenopathy.     Cervical: No cervical adenopathy.  Skin:    General: Skin is warm and dry.  Neurological:     Mental Status: She is alert.  Psychiatric:        Speech: Speech normal.        Behavior: Behavior normal.        Thought Content: Thought content normal.

## 2024-03-09 NOTE — Assessment & Plan Note (Signed)
 Duration 2 weeks.  Patient declines strep testing as symptom does not feel like previous episodes.  Advised continued use of Claritin.  She may start Mucinex for congestion.  If symptoms persist, recommend antibiotic

## 2024-03-09 NOTE — Patient Instructions (Addendum)
 Consider mucinex for sinus pressure. If sore throat persists, let me know  Start magnesium citrate 400mg  daily for headache prevention.   Trial of trazodone   I have ordered echocardiogram to look for structural heart disease  Let us  know if you dont hear back within a week in regards to an appointment being scheduled.   So that you are aware, if you are [facility] MyChart user , please pay attention to your MyChart messages as you may receive a MyChart message with a phone number to call and schedule this test/appointment own your own from our referral coordinator. This is a new process so I do not want you to miss this message.  If you are not a MyChart user, you will receive a phone call.   I have ordered a 14 day Zio monitor which will mailed directly to you. The device will include instructions on how to apply.   The Zio 14 day monitor that we use DOES NOT have 24 hour live monitoring. The rhythm of your heart will not be monitored while you are wearing it. Only AFTER you mail the device back in and a cardiologist interprets the data will we know if an underlying cardiac arrhythmia is going on.   There are models that offer 24 hour live monitoring however NOT this one. I wanted to be sure that you were aware of this as certainly didn't want this to be a false sense of security.   If you experience chest pain, shortness of breath, left arm numbness, or sustained, more frequent palpitations , do not wait and call 911 right away. We are in a delicate period of work up regarding palpitations and until we are sure that you do not have an underlying arrhythmia, you must be extremely vigilant and cautious.      ZIO XT- Long Term Monitor Instructions   Your provider has requested you wear a ZIO patch monitor for 14 days.  This is a single patch monitor. [employer] supplies one patch monitor per enrollment. Additional stickers are not available. Please do not apply patch if you will be having a Nuclear  Stress Test,  Echocardiogram, Cardiac CT, MRI, or Chest Xray during the period you would be wearing the  monitor. The patch cannot be worn during these tests. You cannot remove and re-apply the  ZIO XT patch monitor.  Your ZIO patch monitor will be mailed 3 day USPS to your address on file. It may take 3-5 days  to receive your monitor after you have been enrolled.  Once you have received your monitor, please review the enclosed instructions. Your monitor  has already been registered assigning a specific monitor serial # to you.   Billing and Patient Assistance Program Information   We have supplied [employer] with any of your insurance information on file for billing purposes. [employer] offers a sliding scale Patient Assistance Program for patients that do not have  insurance, or whose insurance does not completely cover the cost of the ZIO monitor.  You must apply for the Patient Assistance Program to qualify for this discounted rate.  To apply, please call [employer] at [phone], select option 4, select option 2, ask to apply for  Patient Assistance Program. Sanna Crystal will ask your household income, and how many people  are in your household. They will quote your out-of-pocket cost based on that information.  [employer] will also be able to set up a 12-month, interest-free payment plan if needed.   Applying the monitor  Shave hair from upper left chest.  Hold abrader disc by orange tab. Rub abrader in 40 strokes over the upper left chest as  indicated in your monitor instructions.  Clean area with 4 enclosed alcohol pads. Let dry.  Apply patch as indicated in monitor instructions. Patch will be placed under collarbone on left  side of chest with arrow pointing upward.  Rub patch adhesive wings for 2 minutes. Remove white label marked "1". Remove the white  label marked "2". Rub patch adhesive wings for 2 additional minutes.  While looking in a mirror, press and release button in center of  patch. A small green light will  flash 3-4 times. This will be your only indicator that the monitor has been turned on.  Do not shower for the first 24 hours. You may shower after the first 24 hours.  Press the button if you feel a symptom. You will hear a small click. Record Date, Time and  Symptom in the Patient Logbook.  When you are ready to remove the patch, follow instructions on the last 2 pages of Patient  Logbook. Stick patch monitor onto the last page of Patient Logbook.  Place Patient Logbook in the blue and white box. Use locking tab on box and tape box closed  securely. The blue and white box has prepaid postage on it. Please place it in the mailbox as  soon as possible. Your physician should have your test results approximately 7 days after the  monitor has been mailed back to [facility].  Call [employer] Customer Care at [phone] if you have questions regarding  your ZIO XT patch monitor. Call them immediately if you see an orange light blinking on your  monitor.  If your monitor falls off in less than 4 days, contact our Monitor department at [phone].  If your monitor becomes loose or falls off after 4 days call [employer] at [phone] for  suggestions on securing your monitor

## 2024-03-09 NOTE — Assessment & Plan Note (Signed)
 Chronic, stable.  Continue BuSpar  10 mg 3 times daily.  Start trazodone  25 to 50 mg nightly for insomnia.

## 2024-03-09 NOTE — Assessment & Plan Note (Addendum)
 Episodic.  She is not orthostatic ( see flow sheet). Discussed patient with patient her low end blood pressure and potential for vasovagal syncope complicated by dehydration.  Advised to liberalize sodium, compression stockings and to limit exposure to hot environments such as shower, outdoor heat. Pending zio monitor, echocardiogram.

## 2024-03-13 ENCOUNTER — Other Ambulatory Visit: Payer: Self-pay | Admitting: Family

## 2024-03-13 ENCOUNTER — Encounter: Payer: Self-pay | Admitting: Family

## 2024-03-13 DIAGNOSIS — J029 Acute pharyngitis, unspecified: Secondary | ICD-10-CM

## 2024-03-13 MED ORDER — AZITHROMYCIN 250 MG PO TABS: ORAL_TABLET | ORAL | 0 refills | Status: AC

## 2024-04-01 ENCOUNTER — Other Ambulatory Visit: Payer: Self-pay

## 2024-04-01 ENCOUNTER — Emergency Department
Admission: EM | Admit: 2024-04-01 | Discharge: 2024-04-01 | Disposition: A | Discharge status: Home / Self Care | Payer: Self-pay | Attending: Emergency Medicine | Admitting: Emergency Medicine

## 2024-04-01 DIAGNOSIS — K0889 Other specified disorders of teeth and supporting structures: Secondary | ICD-10-CM | POA: Insufficient documentation

## 2024-04-01 MED ORDER — LIDOCAINE VISCOUS HCL 2 % MT SOLN: 15.0000 mL | OROMUCOSAL | 0 refills | Status: DC | PRN

## 2024-04-01 MED ORDER — HYDROCODONE-ACETAMINOPHEN 5-325 MG PO TABS
1.0000 | ORAL_TABLET | Freq: Once | ORAL | Status: AC
  Administered 2024-04-01: 1 via ORAL
  Filled 2024-04-01: qty 1

## 2024-04-01 MED ORDER — CLINDAMYCIN HCL 150 MG PO CAPS
450.0000 mg | ORAL_CAPSULE | Freq: Once | ORAL | Status: AC
  Administered 2024-04-01: 450 mg via ORAL
  Filled 2024-04-01: qty 3

## 2024-04-01 MED ORDER — CLINDAMYCIN HCL 150 MG PO CAPS: 450.0000 mg | ORAL_CAPSULE | Freq: Three times a day (TID) | ORAL | 0 refills | Status: AC

## 2024-04-01 MED ORDER — HYDROCODONE-ACETAMINOPHEN 5-325 MG PO TABS: 1.0000 | ORAL_TABLET | Freq: Four times a day (QID) | ORAL | 0 refills | Status: AC | PRN

## 2024-04-01 NOTE — ED Triage Notes (Signed)
 Pt to ED for L lower gum pain and swelling since about 1 week. States had to take lower dentures out due to discomfort.

## 2024-04-01 NOTE — Discharge Instructions (Addendum)
 Please take the antibiotics as prescribed.  If you have any worsening symptoms please return to the emergency department.  Specifically look for worsening swelling, pain, inability to open your mouth or development of a fever.  You can take 650 mg of Tylenol  or 600 mg of ibuprofen  every 6 hours as needed for pain.  I have sent viscous lidocaine  that you can use for mouth pain.  You can take this every 4 hours.  This will numb your mouth.  I have also sent a stronger pain medication called Norco.  This can be taken every 6 hours as needed for severe pain.  Do not drive or drink alcohol after taking this medication.  Please follow-up with your dental provider to have your dentures refit.

## 2024-04-01 NOTE — ED Provider Notes (Signed)
 [facility] Provider Note    Event Date/Time   First MD Initiated Contact with Patient 04/01/24 1827     (approximate)   History   Oral Swelling (L lower gum)   HPI  Isabel [name] is a 33 y.o. female with PMH of anxiety and migraines presents for evaluation of oral pain in the left lower gum.  Patient wears dentures on the bottom and states that it has been rubbing along her gums causing pain.  This has been ongoing for about a week.  Now she is having some facial swelling which is what brought her to the ER.     Physical Exam   Triage Vital Signs: ED Triage Vitals  Encounter Vitals Group     BP 04/01/24 1737 (!) 126/93     Systolic BP Percentile --      Diastolic BP Percentile --      Pulse Rate 04/01/24 1737 (!) 109     Resp 04/01/24 1737 20     Temp 04/01/24 1737 98.3 F (36.8 C)     Temp Source 04/01/24 1737 Oral     SpO2 04/01/24 1737 100 %     Weight 04/01/24 1738 190 lb (86.2 kg)     Height 04/01/24 1738 5\' 4"  (1.626 m)     Head Circumference --      Peak Flow --      Pain Score 04/01/24 1735 8     Pain Loc --      Pain Education --      Exclude from Growth Chart --     Most recent vital signs: Vitals:   04/01/24 1737  BP: (!) 126/93  Pulse: (!) 109  Resp: 20  Temp: 98.3 F (36.8 C)  SpO2: 100%   General: Awake, no distress.  CV:  Good peripheral perfusion.  Resp:  Normal effort.  Abd:  No distention.  Other:  Left side of face is mildly swollen when compared with the right, no teeth in the bottom jaw, just to the left of the midline there appears to be a fissure with some white tissue, this is tender to palpation.  No trismus.   ED Results / Procedures / Treatments   Labs (all labs ordered are listed, but only abnormal results are displayed) Labs Reviewed - No data to display   PROCEDURES:  Critical Care performed: No  Procedures   MEDICATIONS ORDERED IN ED: Medications  clindamycin (CLEOCIN) capsule  450 mg (has no administration in time range)  HYDROcodone -acetaminophen  (NORCO/VICODIN) 5-325 MG per tablet 1 tablet (has no administration in time range)     IMPRESSION / MDM / ASSESSMENT AND PLAN / ED COURSE  I reviewed the triage vital signs and the nursing notes.                             33 year old female presents for evaluation of oral pain and swelling.  Diastolic blood pressure was slightly elevated and patient was tachycardic upon presentation but is quite uncomfortable on exam.  Differential diagnosis includes, but is not limited to, abscess, dental infection, epulis fissuratum.   Patient's presentation is most consistent with acute, uncomplicated illness.  Did consider CT soft tissue to evaluate for deep space abscess, but patient does not have a fever or trismus.  On exam did not feel any area of fluctuance so low suspicion for abscess at this time.  Gum tissue does appear  to be inflamed so we will treat with oral antibiotics.  Emphasized the importance that patient see her dentist to have her dentures refit.  Patient was given some pain medication.  She was given her first dose of antibiotics while in the emergency department as pharmacies are closed.  She voiced understanding, all questions were answered and she is stable at discharge.      FINAL CLINICAL IMPRESSION(S) / ED DIAGNOSES   Final diagnoses:  Pain, dental     Rx / DC Orders   ED Discharge Orders          Ordered    clindamycin (CLEOCIN) 150 MG capsule  3 times daily        04/01/24 1937    HYDROcodone -acetaminophen  (NORCO/VICODIN) 5-325 MG tablet  Every 6 hours PRN        04/01/24 1937    lidocaine  (XYLOCAINE ) 2 % solution  Every 4 hours PRN        04/01/24 1938             Note:  This document was prepared using Dragon voice recognition software and may include unintentional dictation errors.   Phyliss Breen, PA-C 04/01/24 1941    Charleen Conn, MD 04/03/24 [phone]

## 2024-04-04 ENCOUNTER — Other Ambulatory Visit: Payer: Self-pay | Admitting: Family

## 2024-04-07 ENCOUNTER — Telehealth: Payer: Self-pay | Admitting: Family

## 2024-04-07 NOTE — Telephone Encounter (Signed)
 Referral team  I had ordered echocardiogram to evaluate for dizziness  I reced a note that she doesn't have insurance  It appears that she has.   What is issue here in scheduling?

## 2024-04-20 ENCOUNTER — Encounter: Admitting: Family

## 2024-05-05 DIAGNOSIS — R42 Dizziness and giddiness: Secondary | ICD-10-CM

## 2024-05-08 ENCOUNTER — Ambulatory Visit: Payer: Self-pay | Admitting: Family

## 2024-05-08 NOTE — Telephone Encounter (Signed)
 Called pt and explained what is taking so long. She stated that she does not have insurance, but in the process of getting new insurance. She said that she would hold off, and call us  back when she gets insurance again.

## 2024-05-08 NOTE — Telephone Encounter (Signed)
 What is status of echocardiogram?

## 2024-05-08 NOTE — Telephone Encounter (Signed)
 noted

## 2024-05-21 ENCOUNTER — Other Ambulatory Visit: Payer: Self-pay | Admitting: Neurology

## 2024-05-22 ENCOUNTER — Other Ambulatory Visit: Payer: Self-pay | Admitting: Neurology

## 2024-05-22 ENCOUNTER — Encounter: Payer: Self-pay | Admitting: Neurology

## 2024-05-22 MED ORDER — NORTRIPTYLINE HCL 10 MG PO CAPS: ORAL_CAPSULE | ORAL | 0 refills | Status: DC

## 2024-05-26 NOTE — Telephone Encounter (Signed)
 This is not for a PA request.

## 2024-06-21 ENCOUNTER — Encounter: Payer: Self-pay | Admitting: Family

## 2024-07-18 ENCOUNTER — Telehealth: Payer: Self-pay

## 2024-07-18 DIAGNOSIS — R3989 Other symptoms and signs involving the genitourinary system: Secondary | ICD-10-CM

## 2024-07-18 MED ORDER — SULFAMETHOXAZOLE-TRIMETHOPRIM 800-160 MG PO TABS: 1.0000 | ORAL_TABLET | Freq: Two times a day (BID) | ORAL | 0 refills | Status: DC

## 2024-07-18 NOTE — Patient Instructions (Signed)
 Isabel [name], thank you for joining Isabel Velma Lunger, PA-C for today's virtual visit.  While this provider is not your primary care provider (PCP), if your PCP is located in our provider database this encounter information will be shared with them immediately following your visit.   A [facility] MyChart account gives you access to today's visit and all your visits, tests, and labs performed at [facility]  click here if you don't have a [facility] MyChart account or go to [email or web address]  Consent: (Patient) Isabel [name] provided verbal consent for this virtual visit at the beginning of the encounter.  Current Medications:  Current Outpatient Medications:    busPIRone  (BUSPAR ) 10 MG tablet, Take 1 tablet (10 mg total) by mouth 3 (three) times daily., Disp: 270 tablet, Rfl: 3   gabapentin  (NEURONTIN ) 100 MG capsule, Take  two 100 mg capsules by mouth 2 times daily, Disp: 360 capsule, Rfl: 1   lidocaine  (XYLOCAINE ) 2 % solution, Use as directed 15 mLs in the mouth or throat every 4 (four) hours as needed for mouth pain., Disp: 100 mL, Rfl: 0   loratadine (CLARITIN) 10 MG tablet, Take 10 mg by mouth daily., Disp: , Rfl:    metFORMIN  (GLUCOPHAGE -XR) 500 MG 24 hr tablet, Take 1 tablet (500 mg total) by mouth every evening., Disp: 90 tablet, Rfl: 2   nortriptyline  (PAMELOR ) 10 MG capsule, TAKE 1 CAPSULE BY MOUTH EVERYDAY AT BEDTIME, Disp: 90 capsule, Rfl: 0   traZODone  (DESYREL ) 50 MG tablet, TAKE 1/2 TO 1 TABLET (25 TO 50 MG TOTAL) BY MOUTH AT BEDTIME, Disp: 90 tablet, Rfl: 2   Medications ordered in this encounter:  No orders of the defined types were placed in this encounter.    *If you need refills on other medications prior to your next appointment, please contact your pharmacy*  Follow-Up: Call back or seek an in-person evaluation if the symptoms worsen or if the condition fails to improve as anticipated.  [facility] [phone]  Other Instructions Your symptoms are consistent with a bladder infection, also called acute cystitis. Please take your antibiotic (Bactrim ) as directed until all pills are gone.  Stay very well hydrated.  Consider a daily probiotic (Align, Culturelle, or Activia) to help prevent stomach upset caused by the antibiotic.  Taking a probiotic daily may also help prevent recurrent UTIs.  Also consider taking AZO (Phenazopyridine ) tablets to help decrease pain with urination.    Urinary Tract Infection A urinary tract infection (UTI) can occur any place along the urinary tract. The tract includes the kidneys, ureters, bladder, and urethra. A type of germ called bacteria often causes a UTI. UTIs are often helped with antibiotic medicine.  HOME CARE  If given, take antibiotics as told by your doctor. Finish them even if you start to feel better. Drink enough fluids to keep your pee (urine) clear or pale yellow. Avoid tea, drinks with caffeine, and bubbly (carbonated) drinks. Pee often. Avoid holding your pee in for a long time. Pee before and after having sex (intercourse). Wipe from front to back after you poop (bowel movement) if you are a woman. Use each tissue only once. GET HELP RIGHT AWAY IF:  You have back pain. You have lower belly (abdominal) pain. You have chills. You feel sick to your stomach (nauseous). You throw up (vomit). Your burning or discomfort with peeing does not go away. You have a fever. Your symptoms are not better in 3 days. MAKE  SURE YOU:  Understand these instructions. Will watch your condition. Will get help right away if you are not doing well or get worse. Document Released: 04/13/2008 Document Revised: 07/20/2012 Document Reviewed: 05/26/2012 [facility] Patient Information 2015 [place], [place]. This information is not intended to replace advice given to you by your health care provider. Make sure you discuss any questions you have with your health care  provider.    If you have been instructed to have an in-person evaluation today at a local Urgent Care facility, please use the link below. It will take you to a list of all of our available [facility], including address, phone number and hours of operation. Please do not delay care.  [facility]  If you or a family member do not have a primary care provider, use the link below to schedule a visit and establish care. When you choose a [facility] primary care physician or advanced practice provider, you gain a long-term partner in health. Find a Primary Care Provider  Learn more about [facility] in-office and virtual care options: [facility] - Get Care Now

## 2024-07-18 NOTE — Progress Notes (Signed)
 Virtual Visit Consent   Isabel [name], you are scheduled for a virtual visit with a [facility] provider today. Just as with appointments in the office, your consent must be obtained to participate. Your consent will be active for this visit and any virtual visit you may have with one of our providers in the next 365 days. If you have a MyChart account, a copy of this consent can be sent to you electronically.  As this is a virtual visit, video technology does not allow for your provider to perform a traditional examination. This may limit your provider's ability to fully assess your condition. If your provider identifies any concerns that need to be evaluated in person or the need to arrange testing (such as labs, EKG, etc.), we will make arrangements to do so. Although advances in technology are sophisticated, we cannot ensure that it will always work on either your end or our end. If the connection with a video visit is poor, the visit may have to be switched to a telephone visit. With either a video or telephone visit, we are not always able to ensure that we have a secure connection.  By engaging in this virtual visit, you consent to the provision of healthcare and authorize for your insurance to be billed (if applicable) for the services provided during this visit. Depending on your insurance coverage, you may receive a charge related to this service.  I need to obtain your verbal consent now. Are you willing to proceed with your visit today? Isabel [name] has provided verbal consent on 07/18/2024 for a virtual visit (video or telephone). Elsie Velma Lunger, [place]  Date: 07/18/2024 8:45 AM   Virtual Visit via Video Note   I, Elsie Velma Lunger, connected with  Isabel [name]  ([id number], August 02, 1991) on 07/18/24 at  8:45 AM EDT by a video-enabled telemedicine application and verified that I am speaking with the correct person using two identifiers.  Location: Patient: Virtual Visit  Location Patient: Home Provider: Virtual Visit Location Provider: Home Office   I discussed the limitations of evaluation and management by telemedicine and the availability of in person appointments. The patient expressed understanding and agreed to proceed.    History of Present Illness: Isabel [name] is a 33 y.o. who identifies as a female who was assigned female at birth, and is being seen today for possible UTI. Endorses symptoms starting Sunday with right low back pain pain and urgency. 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 Denies fever, chills, nausea or vomiting.  LMP -- not regular. Denies concerns for pregnancy.    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   Anxiety 03/14/2020   RUQ pain 03/14/2020   Periumbilical abdominal pain 03/14/2020   History of laparoscopic cholecystectomy 03/14/2020   Right lower quadrant abdominal pain 03/14/2020   Corpus luteum cyst of right ovary 03/14/2020   Umbilical hernia without obstruction or gangrene 03/14/2020  Genetic testing    Monoallelic mutation of PALB2 gene    Family history of breast cancer in mother    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/24/2016   Tobacco user 02/26/2016   Irregular periods/menstrual cycles 02/26/2016   Neck pain 02/22/2012   Epigastric abdominal pain 09/25/2008   LOW BACK PAIN 08/28/2008   VASOVAGAL SYNCOPE 08/28/2008    Allergies:   Allergies  Allergen Reactions   Amoxicillin Hives   Penicillins Hives   Medications:  Current Outpatient Medications:    sulfamethoxazole -trimethoprim  (BACTRIM  DS) 800-160 MG tablet, Take 1 tablet by mouth 2 (two) times daily., Disp: 10 tablet, Rfl: 0   busPIRone  (BUSPAR ) 10 MG tablet, Take 1 tablet (10 mg total) by mouth 3 (three) times daily., Disp: 270 tablet, Rfl: 3   gabapentin  (NEURONTIN ) 100 MG capsule, Take  two 100 mg capsules by mouth 2 times daily, Disp: 360 capsule, Rfl: 1   nortriptyline  (PAMELOR ) 10 MG capsule, TAKE 1 CAPSULE BY MOUTH EVERYDAY AT BEDTIME, Disp: 90 capsule, Rfl: 0  Observations/Objective: Patient is well-developed, well-nourished in no acute distress.  Resting comfortably at work.  Head is normocephalic, atraumatic.  No labored breathing. Speech is clear and coherent with logical content.  Patient is alert and oriented at baseline.   Assessment and Plan: 1. Suspected UTI (Primary) - sulfamethoxazole -trimethoprim  (BACTRIM  DS) 800-160 MG tablet; Take 1 tablet by mouth 2 (two) times daily.  Dispense: 10 tablet; Refill: 0  Classic UTI symptoms with absence of alarm signs or symptoms. Will treat empirically with Bactrim  for suspected uncomplicated cystitis. Supportive measures and OTC medications reviewed. Strict in-person evaluation precautions discussed.    Follow Up Instructions: I discussed the assessment and treatment plan with the patient. The patient was provided an opportunity to ask questions and all were answered. The patient agreed with the plan and demonstrated an understanding of the instructions.  A copy of instructions were sent to the patient via MyChart unless otherwise noted below.   The patient was advised to call back or seek an in-person evaluation if the symptoms worsen or if the condition fails to improve as anticipated.    Elsie Velma Lunger, PA-C

## 2024-08-24 ENCOUNTER — Encounter: Payer: Self-pay | Admitting: Family

## 2024-08-24 ENCOUNTER — Other Ambulatory Visit: Payer: Self-pay | Admitting: Neurology

## 2024-09-09 ENCOUNTER — Other Ambulatory Visit: Payer: Self-pay | Admitting: Family

## 2024-09-09 DIAGNOSIS — M5441 Lumbago with sciatica, right side: Secondary | ICD-10-CM

## 2024-09-19 ENCOUNTER — Ambulatory Visit (INDEPENDENT_AMBULATORY_CARE_PROVIDER_SITE_OTHER): Payer: Self-pay | Admitting: Family

## 2024-09-19 ENCOUNTER — Other Ambulatory Visit (HOSPITAL_COMMUNITY)
Admission: RE | Admit: 2024-09-19 | Discharge: 2024-09-19 | Disposition: A | Discharge status: Home / Self Care | Payer: Self-pay | Source: Ambulatory Visit | Attending: Family | Admitting: Family

## 2024-09-19 ENCOUNTER — Encounter: Payer: Self-pay | Admitting: Family

## 2024-09-19 VITALS — BP 128/84 | HR 79 | Temp 97.9°F | Ht 64.0 in | Wt 199.6 lb

## 2024-09-19 DIAGNOSIS — R42 Dizziness and giddiness: Secondary | ICD-10-CM

## 2024-09-19 DIAGNOSIS — Z6834 Body mass index (BMI) 34.0-34.9, adult: Secondary | ICD-10-CM

## 2024-09-19 DIAGNOSIS — Z0000 Encounter for general adult medical examination without abnormal findings: Secondary | ICD-10-CM

## 2024-09-19 DIAGNOSIS — Z124 Encounter for screening for malignant neoplasm of cervix: Secondary | ICD-10-CM | POA: Insufficient documentation

## 2024-09-19 DIAGNOSIS — Z803 Family history of malignant neoplasm of breast: Secondary | ICD-10-CM

## 2024-09-19 DIAGNOSIS — F419 Anxiety disorder, unspecified: Secondary | ICD-10-CM

## 2024-09-19 NOTE — Assessment & Plan Note (Signed)
 Medically improved.  Echocardiogram is scheduled.  Will follow

## 2024-09-19 NOTE — Assessment & Plan Note (Addendum)
 Counseled on adding more variation to exercise, weight training.  She has created a caloric deficit without weight loss.  Encouraged her to consult with [facility].  She will consider this and let me know.  She declines starting metformin  at this time

## 2024-09-19 NOTE — Patient Instructions (Addendum)
 Let me know if you an order for mammogram and then in 6 months, you will need MRI breasts.   Consider topamax  instead of nortriptyline .

## 2024-09-19 NOTE — Assessment & Plan Note (Signed)
 She understands importance of screening mammogram, MRI breast due to family history.  She will schedule mammogram and follow-up with me to schedule MRI.  Encouraged exercise.  She declines any baseline screening labs today

## 2024-09-19 NOTE — Assessment & Plan Note (Signed)
 Chronic, stable.  Continue BuSpar  10 mg 3 times daily.  Reiterated the importance of sleep.  Strongly encouraged patient to trial trazodone  25 to 50 mg nightly for insomnia.

## 2024-09-19 NOTE — Progress Notes (Signed)
 Assessment & Plan:  Annual physical exam Assessment & Plan: She understands importance of screening mammogram, MRI breast due to family history.  She will schedule mammogram and follow-up with me to schedule MRI.  Encouraged exercise.  She declines any baseline screening labs today  Orders: -     3D Screening Mammogram, Left and Right; Future  Screening for cervical cancer -     Cytology - PAP  Family history of breast cancer in mother -     3D Screening Mammogram, Left and Right; Future  Anxiety Assessment & Plan: Chronic, stable.  Continue BuSpar  10 mg 3 times daily.  Reiterated the importance of sleep.  Strongly encouraged patient to trial trazodone  25 to 50 mg nightly for insomnia.    Dizziness Assessment & Plan: Medically improved.  Echocardiogram is scheduled.  Will follow   BMI 34.0-34.9,adult Assessment & Plan: Counseled on adding more variation to exercise, weight training.  She has created a caloric deficit without weight loss.  Encouraged her to consult with [facility].  She will consider this and let me know.  She declines starting metformin  at this time      Return precautions given.   Risks, benefits, and alternatives of the medications and treatment plan prescribed today were discussed, and patient expressed understanding.   Education regarding symptom management and diagnosis given to patient on AVS either electronically or printed.  Return in about 6 months (around 03/19/2025).  Rollene Northern, FNP  Subjective:    Patient ID: Isabel [name], female    DOB: 03-Feb-1991, 34 y.o.   MRN: [id number]  CC: Isabel [name] is a 33 y.o. female who presents today for physical exam.    HPI: HPI Discussed the use of AI scribe software for clinical note transcription with the patient, who gave verbal consent to proceed.  History of Present Illness   Isabel [name] is a 33 year old female who presents for an annual physical exam  and Pap smear.  She had been experiencing episodic dizziness since the spring. The dizziness has improved.denies syncope.  She is experiencing sleep disturbances, specifically waking up during the night. She has not taken trazodone , which was previously prescribed for sleep, due to concerns about potential side effects. She is currently taking buspirone  for anxiety, which is helpful for symptoms.   She reports ongoing weight gain despite efforts to manage her diet and exercise. She has been consuming less than 1200 calories a day and has tried various forms of exercise, including walking and swimming. She attributes some of the weight gain to nortriptyline . She has not tried metformin  and prefers not to use it.  She continues to use e-cigarettes with nicotine  .  Her family history includes a mother with a history of breast cancer, and she is behind on her mammogram and MRI screenings due to insurance issues.        No first-degree relatives with colon cancer Breast Cancer Screening: MRI breast 10/28/2022  her mother has a history of breast cancer at age 31.  Overdue for MRI She is no longer having breast discharge  Cervical Cancer Screening: due; obtained 02/06/2021, negative malignancy         Tetanus - utd  Hepatitis C screening - Candidate for; declines  Exercise: Gets regular exercise.   Alcohol use:  none Smoking/tobacco use: Use of e-cigarettes  Health Maintenance  Topic Date Due   Hepatitis C Screening  Never done   Hepatitis B Vaccine (  1 of 3 - 19+ 3-dose series) Never done   HPV Vaccine (1 - 3-dose SCDM series) Never done   Pap with HPV screening  02/07/2024   Pneumococcal Vaccine (1 of 2 - PCV) 12/14/2024*   Flu Shot  02/06/2025*   DTaP/Tdap/Td vaccine (2 - Td or Tdap) 08/19/2026   HIV Screening  Completed   Meningitis B Vaccine  Aged Out   COVID-19 Vaccine  Discontinued  *Topic was postponed. The date shown is not the original due date.    ALLERGIES:  Amoxicillin and Penicillins  Current Outpatient Medications on File Prior to Visit  Medication Sig Dispense Refill   busPIRone  (BUSPAR ) 10 MG tablet Take 1 tablet (10 mg total) by mouth 3 (three) times daily. 270 tablet 3   gabapentin  (NEURONTIN ) 100 MG capsule TAKE TWO 100 MG CAPSULES BY MOUTH 2 TIMES DAILY 360 capsule 1   nortriptyline  (PAMELOR ) 10 MG capsule TAKE 1 CAPSULE BY MOUTH EVERYDAY AT BEDTIME 90 capsule 0   sulfamethoxazole -trimethoprim  (BACTRIM  DS) 800-160 MG tablet Take 1 tablet by mouth 2 (two) times daily. 10 tablet 0   [DISCONTINUED] famotidine  (PEPCID ) 20 MG tablet Take 1 tablet (20 mg total) by mouth 2 (two) times daily. 60 tablet 0   No current facility-administered medications on file prior to visit.    Review of Systems  Constitutional:  Negative for chills and fever.  Respiratory:  Negative for cough.   Cardiovascular:  Negative for chest pain and palpitations.  Gastrointestinal:  Negative for nausea and vomiting.  Neurological:  Negative for dizziness (resolved).      Objective:    BP 128/84   Pulse 79   Temp 97.9 F (36.6 C) (Oral)   Ht 5' 4 (1.626 m)   Wt 199 lb 9.6 oz (90.5 kg)   LMP  (LMP Unknown)   SpO2 98%   BMI 34.26 kg/m   BP Readings from Last 3 Encounters:  09/19/24 128/84  04/01/24 (!) 123/94  03/09/24 126/68   Wt Readings from Last 3 Encounters:  09/19/24 199 lb 9.6 oz (90.5 kg)  04/01/24 190 lb (86.2 kg)  03/09/24 205 lb 9.6 oz (93.3 kg)    Physical Exam Vitals reviewed.  Constitutional:      Appearance: Normal appearance. She is well-developed.  Eyes:     Conjunctiva/sclera: Conjunctivae normal.  Neck:     Thyroid : No thyroid  mass or thyromegaly.  Cardiovascular:     Rate and Rhythm: Normal rate and regular rhythm.     Pulses: Normal pulses.     Heart sounds: Normal heart sounds.  Pulmonary:     Effort: Pulmonary effort is normal.     Breath sounds: Normal breath sounds. No wheezing, rhonchi or rales.  Chest:   Breasts:    Breasts are symmetrical.     Right: No inverted nipple, mass, nipple discharge, skin change or tenderness.     Left: No inverted nipple, mass, nipple discharge, skin change or tenderness.  Abdominal:     General: Bowel sounds are normal. There is no distension.     Palpations: Abdomen is soft. Abdomen is not rigid. There is no fluid wave or mass.     Tenderness: There is no abdominal tenderness. There is no guarding or rebound.  Genitourinary:    Cervix: No cervical motion tenderness, discharge or friability.     Uterus: Not enlarged, not fixed and not tender.      Adnexa:        Right: No mass, tenderness or  fullness.         Left: No mass, tenderness or fullness.       Comments: Pap performed. No CMT. Unable to appreciated ovaries. Lymphadenopathy:     Head:     Right side of head: No submental, submandibular, tonsillar, preauricular, posterior auricular or occipital adenopathy.     Left side of head: No submental, submandibular, tonsillar, preauricular, posterior auricular or occipital adenopathy.     Cervical:     Right cervical: No superficial, deep or posterior cervical adenopathy.    Left cervical: No superficial, deep or posterior cervical adenopathy.     Upper Body:     Right upper body: No pectoral adenopathy.     Left upper body: No pectoral adenopathy.  Skin:    General: Skin is warm and dry.  Neurological:     Mental Status: She is alert.  Psychiatric:        Speech: Speech normal.        Behavior: Behavior normal.        Thought Content: Thought content normal.

## 2024-09-21 ENCOUNTER — Ambulatory Visit: Payer: Self-pay | Admitting: Family

## 2024-09-21 LAB — CYTOLOGY - PAP
Comment: NEGATIVE
Diagnosis: NEGATIVE
Diagnosis: REACTIVE
High risk HPV: NEGATIVE

## 2024-09-26 ENCOUNTER — Ambulatory Visit
Admission: RE | Admit: 2024-09-26 | Discharge: 2024-09-26 | Disposition: A | Discharge status: Home / Self Care | Payer: Self-pay | Source: Ambulatory Visit | Attending: Family | Admitting: Family

## 2024-09-26 DIAGNOSIS — R42 Dizziness and giddiness: Secondary | ICD-10-CM | POA: Insufficient documentation

## 2024-09-26 DIAGNOSIS — F419 Anxiety disorder, unspecified: Secondary | ICD-10-CM | POA: Insufficient documentation

## 2024-09-26 LAB — ECHOCARDIOGRAM COMPLETE
AR max vel: 2.88 cm2
AV Area VTI: 3.07 cm2
AV Area mean vel: 2.65 cm2
AV Mean grad: 2 mmHg
AV Peak grad: 3.5 mmHg
Ao pk vel: 0.93 m/s
Area-P 1/2: 5.88 cm2
MV VTI: 3.86 cm2
S' Lateral: 3.5 cm

## 2024-09-26 NOTE — Progress Notes (Signed)
*  PRELIMINARY RESULTS* Echocardiogram 2D Echocardiogram has been performed.  Isabel [name] 09/26/2024, 1:37 PM

## 2024-10-10 ENCOUNTER — Telehealth: Payer: Self-pay | Admitting: Family

## 2024-10-10 NOTE — Telephone Encounter (Signed)
-----   Message from Verneita LITTIE Kettering sent at 10/07/2024  9:39 AM EST ----- Regarding: RE: Borderline left ventricular hypertrophy. No,I wouldn't since it was borderline,  but I would have her monitor  her BP since several of her diastolic readings have been elevated ----- Message ----- From: Dineen Rollene MATSU, FNP Sent: 10/05/2024   7:47 AM EST To: Verneita LITTIE Kettering, MD Subject: Borderline left ventricular hypertrophy.       TT,   I had evaluated young patient of mine for episodic dizziness, palpitations.  ZIO monitor was reassuring without pauses, arrhythmia.  She did not have any triggers either.  For completion of workup, ordered echocardiogram.  Would you be concerned with  borderline left ventricular hypertrophy on echo?   She does not have history of HTN.

## 2024-10-10 NOTE — Telephone Encounter (Signed)
 Sent patient mychart 10/10/24

## 2024-11-17 NOTE — Progress Notes (Unsigned)
 "  NEUROLOGY FOLLOW UP OFFICE NOTE  Isabel [name] [id number]  Assessment/Plan:   Migraine with and without aura   Due to weight gain, will plan to transition from nortriptyline  to emgality   Migraine prevention:  Emgality .  Discontinue nortriptyline ?  *** Migraine rescue:  rizatriptan  10mg  Limit use of pain relievers to no more than 2 days out of week to prevent risk of rebound or medication-overuse headache. Keep headache diary Follow up 1 year   Subjective:  Isabel [name] is a 34 year old right-handed female who follows up for migraines.   UPDATE: Intensity:  Mild, severe Duration:  30 minutes with rizatriptan  Frequency:  1 to 2 a month (they were daily pre-treatment) Concerned due to continued weight gain on nortriptyline    Frequency of abortive medication: 1 to 2 days a month Current NSAIDS/analgesics:  Children's tylenol  Current triptans:  rizatriptan  10mg  Current ergotamine:  none Current anti-emetic:  none Current muscle relaxants:  none Current Antihypertensive medications:  none Current Antidepressant medications:  Nortriptyline  10mg  at bedtime (concerned about weight gain) Current Anticonvulsant medications:  gabapentin  200mg  BID (sciatica) Current anti-CGRP:  Emgality  Current Vitamins/Herbal/Supplements:  none Current Antihistamines/Decongestants:  none Other therapy:  none   HISTORY:  She has remote history of migraines several years ago but then resolved.  She started getting new headaches in 2021.  She describes a diffuse severe throbbing headache with pins and needles sensation around her right eye with severe right eye pressure, that sometimes radiates to back of her neck.  It occurs daily but fluctuates.  Sometimes she sees spots in both eyes.  She sometimes has nausea but no nausea.  Photophobia but no photophobia.   Sometimes she gets a paroxysmal stabbing pain in the mild-occipital region, occurring every now and then.  She started feeling  numbness and tingling in the arms.  MRI of cervical spine from 03/17/2020 was unremarkable.  However, it did show evidence of empty sella.  MRA of neck was negative. She subsequently saw a chiropractor and neck pain and arm numbness has improved.  She was treated for recurrent ear infections.  She subsequently saw ENT who told her she did not have ear infection but that she should see neurology due to the empty sella finding on cervical MRI.  She denies visual obscurations and pulsatile tinnitus.  She does have high-frequency tinnitus.  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  She does endorse mild transient tinnitus.  To follow up on the incidental vestibular schwannoma, she had repeat MRI of brain/IAC with and without contrast on 03/05/2022, which showed stable partial empty sella but was negative for vestibular schwannoma.  The previous enhancement was likely vascular.    Referred to ophthalmology.  She saw Dr. Lelon in November.  Exhibited dry eyes but no papilledema.     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 270 tablet 3   gabapentin  (NEURONTIN ) 100 MG capsule TAKE TWO 100 MG CAPSULES BY MOUTH 2 TIMES DAILY 360 capsule 1    nortriptyline  (PAMELOR ) 10 MG capsule TAKE 1 CAPSULE BY MOUTH EVERYDAY AT BEDTIME 90 capsule 0   sulfamethoxazole -trimethoprim  (BACTRIM  DS) 800-160 MG tablet Take 1 tablet by mouth 2 (two) times daily. 10 tablet 0   [DISCONTINUED] famotidine  (PEPCID ) 20 MG tablet Take 1 tablet (20 mg total) by mouth 2 (two) times daily. 60 tablet 0   No current facility-administered medications on file prior to visit.      ALLERGIES: Allergies  Allergen Reactions   Amoxicillin Hives   Penicillins Hives    FAMILY HISTORY: Family History  Problem Relation Age of Onset   Breast cancer Mother 78       currently 35   Hypertension Father    Arthritis Other    Diabetes Other    Cancer Other    Diabetes Maternal Grandmother    Diabetes Maternal Grandfather    Diabetes Paternal Grandmother    Diabetes Paternal Grandfather    Breast cancer Maternal Aunt 50       currently 64; PALB2 mutation   Colon cancer Neg Hx    Heart disease Neg Hx       Objective:  *** General: No acute distress.  Patient appears well-groomed.   Head:  Normocephalic/atraumatic Neck:  Supple.  No paraspinal tenderness.  Full range of motion. Heart:  Regular rate and rhythm. Neuro:  Alert and oriented.  Speech fluent and not dysarthric.  Language intact.  CN II-XII intact.  Bulk and tone normal.  Muscle strength 5/5 throughout.  Sensation to light touch intact.  Deep tendon reflexes 2+ throughout, toes downgoing.  Gait normal.  Romberg negative.    Juliene Dunnings, DO  CC: Rollene Northern, FNP ***       "

## 2024-11-20 ENCOUNTER — Ambulatory Visit (INDEPENDENT_AMBULATORY_CARE_PROVIDER_SITE_OTHER): Payer: Self-pay | Admitting: Neurology

## 2024-11-20 ENCOUNTER — Encounter: Payer: Self-pay | Admitting: Neurology

## 2024-11-20 VITALS — BP 115/79 | HR 91 | Ht 64.0 in | Wt 208.0 lb

## 2024-11-20 DIAGNOSIS — G43009 Migraine without aura, not intractable, without status migrainosus: Secondary | ICD-10-CM | POA: Diagnosis not present

## 2024-11-20 MED ORDER — ONDANSETRON HCL 4 MG PO TABS: 4.0000 mg | ORAL_TABLET | Freq: Three times a day (TID) | ORAL | 5 refills | Status: AC | PRN

## 2024-11-20 MED ORDER — RIZATRIPTAN BENZOATE 10 MG PO TABS: ORAL_TABLET | ORAL | 5 refills | Status: AC

## 2024-11-20 NOTE — Patient Instructions (Signed)
 Discontinue nortriptyline  Rizatriptan  and ondansetron  as needed

## 2024-12-06 ENCOUNTER — Telehealth: Admitting: Nurse Practitioner

## 2024-12-06 DIAGNOSIS — B9689 Other specified bacterial agents as the cause of diseases classified elsewhere: Secondary | ICD-10-CM | POA: Insufficient documentation

## 2024-12-06 DIAGNOSIS — J019 Acute sinusitis, unspecified: Secondary | ICD-10-CM | POA: Diagnosis not present

## 2024-12-06 MED ORDER — PROMETHAZINE-DM 6.25-15 MG/5ML PO SYRP: 5.0000 mL | ORAL_SOLUTION | Freq: Four times a day (QID) | ORAL | 0 refills | Status: AC | PRN

## 2024-12-06 MED ORDER — DOXYCYCLINE HYCLATE 100 MG PO TABS: 100.0000 mg | ORAL_TABLET | Freq: Two times a day (BID) | ORAL | 0 refills | Status: AC

## 2024-12-06 MED ORDER — FLUTICASONE PROPIONATE 50 MCG/ACT NA SUSP: 2.0000 | Freq: Every day | NASAL | 0 refills | Status: AC

## 2024-12-06 NOTE — Patient Instructions (Signed)
 " Isabel [name], thank you for joining [facility] Isabel Rouleau, NP for today's virtual visit.  While this provider is not your primary care provider (PCP), if your PCP is located in our provider database this encounter information will be shared with them immediately following your visit.   A [facility] MyChart account gives you access to today's visit and all your visits, tests, and labs performed at [facility]  click here if you don't have a [facility] MyChart account or go to [email or web address]  Consent: (Patient) Isabel [name] provided verbal consent for this virtual visit at the beginning of the encounter.  Current Medications:  Current Outpatient Medications:    busPIRone  (BUSPAR ) 10 MG tablet, Take 1 tablet (10 mg total) by mouth 3 (three) times daily., Disp: 270 tablet, Rfl: 3   doxycycline  (VIBRA -TABS) 100 MG tablet, Take 1 tablet (100 mg total) by mouth 2 (two) times daily for 7 days., Disp: 14 tablet, Rfl: 0   fluticasone  (FLONASE ) 50 MCG/ACT nasal spray, Place 2 sprays into both nostrils daily., Disp: 18.2 mL, Rfl: 0   gabapentin  (NEURONTIN ) 100 MG capsule, TAKE TWO 100 MG CAPSULES BY MOUTH 2 TIMES DAILY, Disp: 360 capsule, Rfl: 1   ondansetron  (ZOFRAN ) 4 MG tablet, Take 1 tablet (4 mg total) by mouth every 8 (eight) hours as needed., Disp: 20 tablet, Rfl: 5   promethazine -dextromethorphan (PROMETHAZINE -DM) 6.25-15 MG/5ML syrup, Take 5 mLs by mouth 4 (four) times daily as needed for cough., Disp: 118 mL, Rfl: 0   rizatriptan  (MAXALT ) 10 MG tablet, Take 1 tablet earliest onset of migraine.  May repeat in 2 hours if needed.  Maximum 2 tablets in 24 hours, Disp: 10 tablet, Rfl: 5   Medications ordered in this encounter:  Meds ordered this encounter  Medications   fluticasone  (FLONASE ) 50 MCG/ACT nasal spray    Sig: Place 2 sprays into both nostrils daily.    Dispense:  18.2 mL    Refill:  0   promethazine -dextromethorphan (PROMETHAZINE -DM) 6.25-15 MG/5ML  syrup    Sig: Take 5 mLs by mouth 4 (four) times daily as needed for cough.    Dispense:  118 mL    Refill:  0   doxycycline  (VIBRA -TABS) 100 MG tablet    Sig: Take 1 tablet (100 mg total) by mouth 2 (two) times daily for 7 days.    Dispense:  14 tablet    Refill:  0     *If you need refills on other medications prior to your next appointment, please contact your pharmacy*  Follow-Up: Call back or seek an in-person evaluation if the symptoms worsen or if the condition fails to improve as anticipated.  [facility] [phone]  Other Instructions Based on what you have shared with me it looks like you have sinusitis.  Sinusitis is inflammation and infection in the sinus cavities of the head.  Based on your presentation I believe you most likely have Acute Bacterial Sinusitis.  This is an infection caused by bacteria and is treated with antibiotics. You have been prescribed an antibiotic doxycycline , a cough suppressant promethazine -dextromethorphan, and flonase  nasal spray to help calm down the cough/manage the sinus symptoms. I also recommend taking Mucinex D, if you do not have glaucoma or high blood pressure, or Mucinex DM, over the counter. You can continue the Dayquil as an alternative as well. Saline nasal irrigations/rinses 1-2 days are also beneficial to help treat the sinus symptoms; use bottled distilled water with these. Ensure you  are not duplicating over the counter medications by reading the active ingredients on the labels.   Please take your antibiotic in it's entirety, and do not stop it just because you are feeling better. Take it with food.  If you develop worsening sinus pain, fever or notice severe headache and vision changes, or if symptoms are not better after completion of antibiotic, please schedule an appointment with a health care provider.    Sinus infections are not as easily transmitted as other respiratory infection, however we still recommend that  you avoid close contact with loved ones, especially the very young and elderly.  Remember to wash your hands thoroughly throughout the day as this is the number one way to prevent the spread of infection!  Home Care: Only take medications as instructed by your medical team. Complete the entire course of an antibiotic. Do not take these medications with alcohol. A steam or ultrasonic humidifier can help congestion.  You can place a towel over your head and breathe in the steam from hot water coming from a faucet. Avoid close contacts especially the very young and the elderly. Cover your mouth when you cough or sneeze. Always remember to wash your hands.  Get Help Right Away If: You develop worsening fever or sinus pain. You develop a severe head ache or visual changes. Your symptoms persist after you have completed your treatment plan.      If you have been instructed to have an in-person evaluation today at a local Urgent Care facility, please use the link below. It will take you to a list of all of our available [facility], including address, phone number and hours of operation. Please do not delay care.  [facility]  If you or a family member do not have a primary care provider, use the link below to schedule a visit and establish care. When you choose a [facility] primary care physician or advanced practice provider, you gain a long-term partner in health. Find a Primary Care Provider  Learn more about [facility]'s in-office and virtual care options: [facility] - Get Care Now  "

## 2024-12-06 NOTE — Progress Notes (Signed)
 " Virtual Visit Consent   Isabel [name], you are scheduled for a virtual visit with a [facility] provider today. Just as with appointments in the office, your consent must be obtained to participate. Your consent will be active for this visit and any virtual visit you may have with one of our providers in the next 365 days. If you have a MyChart account, a copy of this consent can be sent to you electronically.  As this is a virtual visit, video technology does not allow for your provider to perform a traditional examination. This may limit your provider's ability to fully assess your condition. If your provider identifies any concerns that need to be evaluated in person or the need to arrange testing (such as labs, EKG, etc.), we will make arrangements to do so. Although advances in technology are sophisticated, we cannot ensure that it will always work on either your end or our end. If the connection with a video visit is poor, the visit may have to be switched to a telephone visit. With either a video or telephone visit, we are not always able to ensure that we have a secure connection.  By engaging in this virtual visit, you consent to the provision of healthcare and authorize for your insurance to be billed (if applicable) for the services provided during this visit. Depending on your insurance coverage, you may receive a charge related to this service.  I need to obtain your verbal consent now. Are you willing to proceed with your visit today? Isabel [name] has provided verbal consent on 12/06/2024 for a virtual visit (video or telephone). Isabel LULLA Rouleau, NP  Date: 12/06/2024 10:16 AM   Virtual Visit via Video Note   I, Isabel [name], connected with  Isabel [name]  ([id number], Sep 09, 1991) on 12/06/24 at 10:15 AM EST by a video-enabled telemedicine application and verified that I am speaking with the correct person using two identifiers.  Location: Patient: Virtual Visit  Location Patient: Mobile Provider: Virtual Visit Location Provider: Home Office   I discussed the limitations of evaluation and management by telemedicine and the availability of in person appointments. The patient expressed understanding and agreed to proceed.    History of Present Illness: Isabel [name] is a 34 y.o. who identifies as a female who was assigned female at birth, and is being seen today for sinus congestion, cough, sore throat.  Started Thursday last week. Feels she has been getting worse over the past 24-48 hours despite supportive care measures at home.  No viral testing at onset of symptoms.  Green drainage from her nose.  Coughing up green phlegm as well  Intermittent headaches  No fevers above 100.4 F No chest tightness, wheezing, shortness of breath, chest pain No ear pain, difficulties swallowing  Eating and drinking as normal  Taking Nyquil and Dayquil  No current use of any nasal sprays  No hx of pulmonary disease or recurrent infections   HPI: HPI  Problems:  Patient Active Problem List   Diagnosis Date Noted   Acute bacterial rhinosinusitis 12/06/2024   Dizziness 03/09/2024   COVID-19 07/19/2023   Pelvic pain 03/04/2023   Tick bite 01/27/2023   Galactorrhea 09/30/2022   Chronic dysfunction of right eustachian tube 06/26/2022   Dysmenorrhea 06/26/2022   BMI 34.0-34.9,adult 11/27/2021   Migraines    Strain of rhomboid muscle 07/10/2021   Chronic venous insufficiency 07/03/2020   Moderate depressive disorder 06/06/2020   Pharyngitis 04/15/2020   Seasonal  allergies 04/11/2020   Aberrant right subclavian artery 03/14/2020   Anxiety 03/14/2020   RUQ pain 03/14/2020   Periumbilical abdominal pain 03/14/2020   History of laparoscopic cholecystectomy 03/14/2020   Right lower quadrant abdominal pain 03/14/2020   Corpus luteum cyst of right ovary 03/14/2020   Umbilical hernia without obstruction or gangrene 03/14/2020   Annual physical exam     Monoallelic mutation of PALB2 gene    Family history of breast cancer in mother    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  Resting comfortably at home.  Head is normocephalic, atraumatic.  No labored breathing. Able to speak in complete sentences without difficulties Congested sounding  Speech is clear and coherent with logical content.  Patient is alert and oriented at baseline.   Assessment and Plan: 1. Acute bacterial rhinosinusitis (Primary) - fluticasone  (FLONASE ) 50 MCG/ACT nasal spray; Place 2 sprays into both nostrils daily.  Dispense: 18.2 mL; Refill: 0 - promethazine -dextromethorphan (PROMETHAZINE -DM) 6.25-15 MG/5ML syrup; Take 5 mLs by mouth 4 (four) times daily as needed for cough.  Dispense: 118 mL; Refill: 0 - doxycycline  (VIBRA -TABS) 100 MG tablet; Take 1 tablet (100 mg total) by mouth 2 (two) times daily for 7 days.  Dispense: 14 tablet; Refill: 0  Acute bacterial rhinosinusitis Viral illness with worsening symptoms, consistent with bacterial coinfection. Plan to treat with empiric augmentin bid x 7 days, intranasal steroid, and cough control measures. Side effect profiles reviewed. Continued supportive care encouraged. Reviewed s/s necessitating in person evaluation. All questions addressed and verbalized understanding.    Follow Up Instructions: I discussed the assessment and treatment plan with the patient. The patient was provided an opportunity to ask questions and all were answered. The patient agreed with the plan and demonstrated an understanding of the instructions.  A copy of instructions  were sent to the patient via MyChart unless otherwise noted below.    The patient was advised to call back or seek an in-person evaluation if the symptoms worsen or if the condition fails to improve as anticipated.    Isabel LULLA Rouleau, NP     [1]  Allergies Allergen Reactions   Amoxicillin Hives   Penicillins Hives  [2]  Current Outpatient Medications:    busPIRone  (BUSPAR ) 10 MG tablet, Take 1 tablet (10 mg total) by mouth 3 (three) times daily., Disp: 270 tablet, Rfl: 3   doxycycline  (VIBRA -TABS) 100 MG tablet, Take 1 tablet (100 mg total) by mouth 2 (two) times daily for 7 days., Disp: 14 tablet, Rfl: 0   fluticasone  (FLONASE ) 50 MCG/ACT nasal spray, Place 2 sprays into both nostrils daily., Disp: 18.2 mL, Rfl: 0   gabapentin  (NEURONTIN ) 100 MG capsule, TAKE TWO 100 MG CAPSULES BY MOUTH 2 TIMES DAILY, Disp: 360 capsule, Rfl: 1   ondansetron  (ZOFRAN ) 4 MG tablet, Take 1 tablet (4 mg total) by mouth every 8 (eight) hours as needed., Disp: 20 tablet, Rfl: 5   promethazine -dextromethorphan (PROMETHAZINE -DM) 6.25-15 MG/5ML syrup, Take 5 mLs by mouth 4 (four) times daily as needed for cough., Disp: 118 mL, Rfl: 0   rizatriptan  (MAXALT ) 10 MG tablet, Take 1 tablet earliest onset of migraine.  May repeat in 2 hours if needed.  Maximum 2 tablets in 24 hours, Disp: 10 tablet, Rfl: 5  "

## 2024-12-06 NOTE — Assessment & Plan Note (Signed)
 Viral illness with worsening symptoms, consistent with bacterial coinfection. Plan to treat with empiric augmentin bid x 7 days, intranasal steroid, and cough control measures. Side effect profiles reviewed. Continued supportive care encouraged. Reviewed s/s necessitating in person evaluation. All questions addressed and verbalized understanding.

## 2025-03-19 ENCOUNTER — Ambulatory Visit: Payer: Self-pay | Admitting: Family

## 2025-05-28 ENCOUNTER — Ambulatory Visit: Payer: Self-pay | Admitting: Neurology
